# Patient Record
Sex: Female | Born: 1970 | Race: White | Hispanic: No | Smoking: Current some day smoker
Health system: Southern US, Community
[De-identification: ages and names within clinical notes are randomized; demographics above are authoritative.]

## PROBLEM LIST (undated history)

## (undated) DIAGNOSIS — M313 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: Secondary | ICD-10-CM

## (undated) DIAGNOSIS — M069 Rheumatoid arthritis, unspecified: Secondary | ICD-10-CM

## (undated) DIAGNOSIS — F419 Anxiety disorder, unspecified: Secondary | ICD-10-CM

## (undated) DIAGNOSIS — N6452 Nipple discharge: Secondary | ICD-10-CM

## (undated) DIAGNOSIS — J8410 Pulmonary fibrosis, unspecified: Secondary | ICD-10-CM

## (undated) DIAGNOSIS — F32A Depression, unspecified: Secondary | ICD-10-CM

## (undated) DIAGNOSIS — D689 Coagulation defect, unspecified: Secondary | ICD-10-CM

## (undated) DIAGNOSIS — F329 Major depressive disorder, single episode, unspecified: Secondary | ICD-10-CM

## (undated) HISTORY — DX: 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: M31.30

## (undated) HISTORY — DX: Rheumatoid arthritis, unspecified: M06.9

## (undated) HISTORY — DX: Pulmonary fibrosis, unspecified: J84.10

## (undated) HISTORY — DX: Coagulation defect, unspecified: D68.9

## (undated) HISTORY — DX: Anxiety disorder, unspecified: F41.9

## (undated) HISTORY — PX: ABDOMINAL HYSTERECTOMY: SHX81

## (undated) SURGERY — ABDOMINAL AORTOGRAM W/LOWER EXTREMITY
Anesthesia: Moderate Sedation

---

## 2004-03-15 HISTORY — PX: BREAST CYST EXCISION: SHX579

## 2006-06-27 ENCOUNTER — Ambulatory Visit: Payer: Self-pay | Admitting: Oncology

## 2011-03-16 DIAGNOSIS — N6452 Nipple discharge: Secondary | ICD-10-CM

## 2011-03-16 HISTORY — DX: Nipple discharge: N64.52

## 2011-06-04 ENCOUNTER — Emergency Department: Payer: Self-pay | Admitting: Unknown Physician Specialty

## 2011-06-04 LAB — COMPREHENSIVE METABOLIC PANEL
Albumin: 3.5 g/dL (ref 3.4–5.0)
Alkaline Phosphatase: 80 U/L (ref 50–136)
Anion Gap: 7 (ref 7–16)
BUN: 8 mg/dL (ref 7–18)
Bilirubin,Total: 0.3 mg/dL (ref 0.2–1.0)
Calcium, Total: 8.3 mg/dL — ABNORMAL LOW (ref 8.5–10.1)
Chloride: 105 mmol/L (ref 98–107)
EGFR (Non-African Amer.): >60
Osmolality: 273 (ref 275–301)
Potassium: 3.6 mmol/L (ref 3.5–5.1)
SGOT(AST): 13 U/L — ABNORMAL LOW (ref 15–37)
SGPT (ALT): 14 U/L
Sodium: 138 mmol/L (ref 136–145)
Total Protein: 7.9 g/dL (ref 6.4–8.2)

## 2011-06-04 LAB — CBC
HCT: 37.7 % (ref 35.0–47.0)
HGB: 12.6 g/dL (ref 12.0–16.0)
MCH: 29.7 pg (ref 26.0–34.0)
Platelet: 296 10*3/uL (ref 150–440)
RBC: 4.26 10*6/uL (ref 3.80–5.20)
RDW: 14.3 % (ref 11.5–14.5)
WBC: 13 10*3/uL — ABNORMAL HIGH (ref 3.6–11.0)

## 2011-06-04 LAB — CK TOTAL AND CKMB (NOT AT ARMC): CK-MB: <0.5 ng/mL — ABNORMAL LOW (ref 0.5–3.6)

## 2011-06-04 LAB — TROPONIN I: Troponin-I: <0.02 ng/mL

## 2011-06-05 LAB — RAPID INFLUENZA A&B ANTIGENS

## 2011-06-09 LAB — WOUND CULTURE

## 2011-09-07 ENCOUNTER — Ambulatory Visit: Payer: Self-pay

## 2011-10-18 ENCOUNTER — Emergency Department: Payer: Self-pay | Admitting: Emergency Medicine

## 2012-06-05 ENCOUNTER — Emergency Department: Payer: Self-pay | Admitting: Unknown Physician Specialty

## 2012-06-06 LAB — BETA STREP CULTURE(ARMC)

## 2012-07-01 ENCOUNTER — Emergency Department: Payer: Self-pay | Admitting: Emergency Medicine

## 2013-01-04 ENCOUNTER — Emergency Department: Payer: Self-pay | Admitting: Internal Medicine

## 2013-01-04 LAB — RAPID INFLUENZA A&B ANTIGENS

## 2013-09-17 ENCOUNTER — Emergency Department: Payer: Self-pay | Admitting: Emergency Medicine

## 2013-09-17 LAB — COMPREHENSIVE METABOLIC PANEL
ANION GAP: 8 (ref 7–16)
AST: 22 U/L (ref 15–37)
Albumin: 3.3 g/dL — ABNORMAL LOW (ref 3.4–5.0)
Alkaline Phosphatase: 76 U/L
BUN: 12 mg/dL (ref 7–18)
Bilirubin,Total: 0.1 mg/dL — ABNORMAL LOW (ref 0.2–1.0)
CALCIUM: 8 mg/dL — AB (ref 8.5–10.1)
CREATININE: 0.69 mg/dL (ref 0.60–1.30)
Chloride: 104 mmol/L (ref 98–107)
Co2: 25 mmol/L (ref 21–32)
Glucose: 87 mg/dL (ref 65–99)
Osmolality: 273 (ref 275–301)
Potassium: 3.8 mmol/L (ref 3.5–5.1)
SGPT (ALT): 21 U/L (ref 12–78)
Sodium: 137 mmol/L (ref 136–145)
Total Protein: 7.1 g/dL (ref 6.4–8.2)

## 2013-09-17 LAB — CBC WITH DIFFERENTIAL/PLATELET
BASOS PCT: 0.6 %
Basophil #: 0.1 10*3/uL (ref 0.0–0.1)
EOS ABS: 0.6 10*3/uL (ref 0.0–0.7)
Eosinophil %: 6.1 %
HCT: 38.7 % (ref 35.0–47.0)
HGB: 13.3 g/dL (ref 12.0–16.0)
Lymphocyte #: 2.4 10*3/uL (ref 1.0–3.6)
Lymphocyte %: 23.3 %
MCH: 30.6 pg (ref 26.0–34.0)
MCHC: 34.2 g/dL (ref 32.0–36.0)
MCV: 89 fL (ref 80–100)
Monocyte #: 0.6 x10 3/mm (ref 0.2–0.9)
Monocyte %: 6 %
Neutrophil #: 6.5 10*3/uL (ref 1.4–6.5)
Neutrophil %: 64 %
Platelet: 314 10*3/uL (ref 150–440)
RBC: 4.33 10*6/uL (ref 3.80–5.20)
RDW: 14.3 % (ref 11.5–14.5)
WBC: 10.1 10*3/uL (ref 3.6–11.0)

## 2013-09-17 LAB — URINALYSIS, COMPLETE
BACTERIA: NONE SEEN
BLOOD: NEGATIVE
Bilirubin,UR: NEGATIVE
Glucose,UR: NEGATIVE mg/dL (ref 0–75)
KETONE: NEGATIVE
LEUKOCYTE ESTERASE: NEGATIVE
Nitrite: NEGATIVE
Ph: 5 (ref 4.5–8.0)
Protein: NEGATIVE
SPECIFIC GRAVITY: 1.008 (ref 1.003–1.030)
Squamous Epithelial: 2

## 2013-09-17 LAB — CK TOTAL AND CKMB (NOT AT ARMC)
CK, Total: 49 U/L
CK-MB: 0.6 ng/mL (ref 0.5–3.6)

## 2013-09-17 LAB — TROPONIN I
Troponin-I: <0.02 ng/mL
Troponin-I: <0.02 ng/mL

## 2013-09-17 LAB — LIPASE, BLOOD: LIPASE: 130 U/L (ref 73–393)

## 2013-11-05 ENCOUNTER — Emergency Department: Payer: Self-pay | Admitting: Emergency Medicine

## 2013-11-08 ENCOUNTER — Emergency Department: Payer: Self-pay | Admitting: Emergency Medicine

## 2013-11-08 LAB — CBC WITH DIFFERENTIAL/PLATELET
BASOS ABS: 0.1 10*3/uL (ref 0.0–0.1)
Basophil %: 1.3 %
EOS ABS: 0.3 10*3/uL (ref 0.0–0.7)
Eosinophil %: 5.4 %
HCT: 42.1 % (ref 35.0–47.0)
HGB: 13.5 g/dL (ref 12.0–16.0)
Lymphocyte #: 2.3 10*3/uL (ref 1.0–3.6)
Lymphocyte %: 36.7 %
MCH: 29.3 pg (ref 26.0–34.0)
MCHC: 32.1 g/dL (ref 32.0–36.0)
MCV: 91 fL (ref 80–100)
Monocyte #: 0.9 x10 3/mm (ref 0.2–0.9)
Monocyte %: 14 %
Neutrophil #: 2.7 10*3/uL (ref 1.4–6.5)
Neutrophil %: 42.6 %
Platelet: 304 10*3/uL (ref 150–440)
RBC: 4.61 10*6/uL (ref 3.80–5.20)
RDW: 14.3 % (ref 11.5–14.5)
WBC: 6.3 10*3/uL (ref 3.6–11.0)

## 2013-11-08 LAB — BASIC METABOLIC PANEL
Anion Gap: 10 (ref 7–16)
BUN: 10 mg/dL (ref 7–18)
Calcium, Total: 8.3 mg/dL — ABNORMAL LOW (ref 8.5–10.1)
Chloride: 100 mmol/L (ref 98–107)
Co2: 23 mmol/L (ref 21–32)
Creatinine: 0.73 mg/dL (ref 0.60–1.30)
EGFR (African American): >60
EGFR (Non-African Amer.): >60
Glucose: 92 mg/dL (ref 65–99)
Osmolality: 265 (ref 275–301)
POTASSIUM: 3.7 mmol/L (ref 3.5–5.1)
Sodium: 133 mmol/L — ABNORMAL LOW (ref 136–145)

## 2014-07-23 ENCOUNTER — Encounter: Payer: Self-pay | Admitting: *Deleted

## 2014-07-23 ENCOUNTER — Emergency Department: Payer: Self-pay

## 2014-07-23 ENCOUNTER — Other Ambulatory Visit: Payer: Self-pay

## 2014-07-23 ENCOUNTER — Emergency Department
Admission: EM | Admit: 2014-07-23 | Discharge: 2014-07-23 | Disposition: A | Discharge status: Home / Self Care | Payer: Self-pay | Attending: Emergency Medicine | Admitting: Emergency Medicine

## 2014-07-23 DIAGNOSIS — M79622 Pain in left upper arm: Secondary | ICD-10-CM | POA: Insufficient documentation

## 2014-07-23 DIAGNOSIS — Z720 Tobacco use: Secondary | ICD-10-CM | POA: Insufficient documentation

## 2014-07-23 DIAGNOSIS — R079 Chest pain, unspecified: Secondary | ICD-10-CM | POA: Insufficient documentation

## 2014-07-23 DIAGNOSIS — M79602 Pain in left arm: Secondary | ICD-10-CM

## 2014-07-23 HISTORY — DX: Depression, unspecified: F32.A

## 2014-07-23 HISTORY — DX: Major depressive disorder, single episode, unspecified: F32.9

## 2014-07-23 LAB — CBC
HCT: 38.4 % (ref 35.0–47.0)
Hemoglobin: 12.7 g/dL (ref 12.0–16.0)
MCH: 29.3 pg (ref 26.0–34.0)
MCHC: 33 g/dL (ref 32.0–36.0)
MCV: 88.7 fL (ref 80.0–100.0)
Platelets: 297 10*3/uL (ref 150–440)
RBC: 4.33 MIL/uL (ref 3.80–5.20)
RDW: 14.8 % — AB (ref 11.5–14.5)
WBC: 7.6 10*3/uL (ref 3.6–11.0)

## 2014-07-23 LAB — BASIC METABOLIC PANEL
Anion gap: 7 (ref 5–15)
BUN: 17 mg/dL (ref 6–20)
CO2: 27 mmol/L (ref 22–32)
Calcium: 8.9 mg/dL (ref 8.9–10.3)
Chloride: 104 mmol/L (ref 101–111)
Creatinine, Ser: 0.43 mg/dL — ABNORMAL LOW (ref 0.44–1.00)
GFR calc non Af Amer: >60 mL/min (ref >60)
Glucose, Bld: 121 mg/dL — ABNORMAL HIGH (ref 65–99)
Potassium: 3.7 mmol/L (ref 3.5–5.1)
Sodium: 138 mmol/L (ref 135–145)

## 2014-07-23 LAB — TROPONIN I: Troponin I: <0.03 ng/mL (ref <0.031)

## 2014-07-23 MED ORDER — PREDNISONE 20 MG PO TABS: 40.0000 mg | ORAL_TABLET | Freq: Every day | ORAL | Status: AC

## 2014-07-23 NOTE — ED Provider Notes (Signed)
[facility] Emergency Department Provider Note    ____________________________________________  Time seen: 1640  I have reviewed the triage vital signs and the nursing notes.   HISTORY  Chief Complaint Chest Pain and Arm Pain   History limited by: Not Limited   HPI Alexandra [name] is a 44 y.o. female who presents to the emergency department today because of left upper extremity pain. The patient states that she first developed pain 3 days ago upon awakening. She denies any trauma to the arm. She states the pain is located only in the upper arm and is sharp in nature. It is not tender to touch. She has not had any associated numbness or weakness of the arm. She says the pain is somewhat worse when she lies flat. She has never had pain like this before. No shortness of breath. No fevers.     Past Medical History  Diagnosis Date  . Depression     There are no active problems to display for this patient.   Past Surgical History  Procedure Laterality Date  . Abdominal hysterectomy      No current outpatient prescriptions on file.  Allergies Review of patient's allergies indicates no known allergies.  No family history on file.  Social History History  Substance Use Topics  . Smoking status: Current Every Day Smoker -- 1.00 packs/day for 29 years    Types: Cigarettes  . Smokeless tobacco: Never Used  . Alcohol Use: Yes     Comment: social    Review of Systems  Constitutional: Negative for fever. Cardiovascular: Positive for chest pain. Respiratory: Negative for shortness of breath. Gastrointestinal: Negative for abdominal pain, vomiting and diarrhea. Genitourinary: Negative for dysuria. Musculoskeletal: Negative for back pain. Skin: Negative for rash. Neurological: Negative for headaches, focal weakness or numbness.  10-point ROS otherwise negative.  ____________________________________________   PHYSICAL EXAM:  VITAL  SIGNS: ED Triage Vitals  Enc Vitals Group     BP --      Pulse Rate 07/23/14 1316 75     Resp --      Temp 07/23/14 1316 98.3 F (36.8 C)     Temp Source 07/23/14 1316 Oral     SpO2 07/23/14 1316 99 %     Weight 07/23/14 1316 135 lb (61.236 kg)     Height 07/23/14 1316 5\' 4"  (1.626 m)     Head Cir --      Peak Flow --      Pain Score 07/23/14 1317 9   Constitutional: Alert and oriented. Well appearing and in no distress. Eyes: Conjunctivae are normal. PERRL. Normal extraocular movements. ENT   Head: Normocephalic and atraumatic.   Nose: No congestion/rhinnorhea.   Mouth/Throat: Mucous membranes are moist.   Neck: No stridor. Hematological/Lymphatic/Immunilogical: No cervical lymphadenopathy. Cardiovascular: Normal rate, regular rhythm.  No murmurs, rubs, or gallops. Respiratory: Normal respiratory effort without tachypnea nor retractions.  Genitourinary: Deferred Musculoskeletal: Normal range of motion in all extremities. No joint effusions.  No lower extremity tenderness nor edema. Tenderness to palpation of the left upper extremity, no deformities, no erythema, no joint swelling, no decreased range of motion. Neurologic:  Normal speech and language. No gross focal neurologic deficits are appreciated. Speech is normal.  Skin:  Skin is warm, dry and intact. No rash noted. Psychiatric: Mood and affect are normal. Speech and behavior are normal. Patient exhibits appropriate insight and judgment.  ____________________________________________    LABS (pertinent positives/negatives)  Labs Reviewed  CBC - Abnormal;  Notable for the following:    RDW 14.8 (*)    All other components within normal limits  BASIC METABOLIC PANEL - Abnormal; Notable for the following:    Glucose, Bld 121 (*)    Creatinine, Ser 0.43 (*)    All other components within normal limits  TROPONIN I     ____________________________________________   EKG  EKG Time: 1332 Rate: 74 Rhythm:  Normal sinus rhythm Axis: Normal Intervals: QTc 432 QRS: Normal ST changes: No ST elevation    ____________________________________________    RADIOLOGY  Chest x-ray IMPRESSION: No acute cardiopulmonary abnormality seen.  ____________________________________________   PROCEDURES  Procedure(s) performed: None  Critical Care performed: No  ____________________________________________   INITIAL IMPRESSION / ASSESSMENT AND PLAN / ED COURSE  Pertinent labs & imaging results that were available during my care of the patient were reviewed by me and considered in my medical decision making (see chart for details).  Patient here because of left arm pain for a number of days. Workup without any clear etiology of the pain. Physical exam without any concerning findings. Question if this is an inflammation of the nerve. Although I would think unlikely pericarditis is also on the differential given that the patient says it is worse when she lies flat. Advised patient to continue to take high-dose ibuprofen which she has been doing. Additionally will give short course of steroids to help with any potential inflammation.  ____________________________________________   FINAL CLINICAL IMPRESSION(S) / ED DIAGNOSES  Final diagnoses:  Pain of left upper extremity     Phineas SemenGraydon Sione Baumgarten, MD 07/23/14 [phone]

## 2014-07-23 NOTE — ED Notes (Signed)
Pt here with c/o chest pain this am and constant pain in left arm x 2 days.  Pt advises pain is worse at times but never stops in her arm.  Pt advises she became nauseated this with her chest pain.

## 2014-07-23 NOTE — Discharge Instructions (Signed)
Please seek medical attention for any high fevers, chest pain, shortness of breath, change in behavior, persistent vomiting, bloody stool or any other new or concerning symptoms. ° °Musculoskeletal Pain °Musculoskeletal pain is muscle and boney aches and pains. These pains can occur in any part of the body. Your caregiver may treat you without knowing the cause of the pain. They may treat you if blood or urine tests, X-rays, and other tests were normal.  °CAUSES °There is often not a definite cause or reason for these pains. These pains may be caused by a type of germ (virus). The discomfort may also come from overuse. Overuse includes working out too hard when your body is not fit. Boney aches also come from weather changes. Bone is sensitive to atmospheric pressure changes. °HOME CARE INSTRUCTIONS  °· Ask when your test results will be ready. Make sure you get your test results. °· Only take over-the-counter or prescription medicines for pain, discomfort, or fever as directed by your caregiver. If you were given medications for your condition, do not drive, operate machinery or power tools, or sign legal documents for 24 hours. Do not drink alcohol. Do not take sleeping pills or other medications that may interfere with treatment. °· Continue all activities unless the activities cause more pain. When the pain lessens, slowly resume normal activities. Gradually increase the intensity and duration of the activities or exercise. °· During periods of severe pain, bed rest may be helpful. Lay or sit in any position that is comfortable. °· Putting ice on the injured area. °¨ Put ice in a bag. °¨ Place a towel between your skin and the bag. °¨ Leave the ice on for 15 to 20 minutes, 3 to 4 times a day. °· Follow up with your caregiver for continued problems and no reason can be found for the pain. If the pain becomes worse or does not go away, it may be necessary to repeat tests or do additional testing. Your caregiver  may need to look further for a possible cause. °SEEK IMMEDIATE MEDICAL CARE IF: °· You have pain that is getting worse and is not relieved by medications. °· You develop chest pain that is associated with shortness or breath, sweating, feeling sick to your stomach (nauseous), or throw up (vomit). °· Your pain becomes localized to the abdomen. °· You develop any new symptoms that seem different or that concern you. °MAKE SURE YOU:  °· Understand these instructions. °· Will watch your condition. °· Will get help right away if you are not doing well or get worse. °Document Released: 03/01/2005 Document Revised: 05/24/2011 Document Reviewed: 11/03/2012 °ExitCare® Patient Information ©2015 [employer]. This information is not intended to replace advice given to you by your health care provider. Make sure you discuss any questions you have with your health care provider. ° °

## 2014-08-02 ENCOUNTER — Emergency Department
Admission: EM | Admit: 2014-08-02 | Discharge: 2014-08-03 | Disposition: A | Discharge status: Home / Self Care | Payer: Self-pay | Attending: Emergency Medicine | Admitting: Emergency Medicine

## 2014-08-02 ENCOUNTER — Encounter: Payer: Self-pay | Admitting: Emergency Medicine

## 2014-08-02 DIAGNOSIS — J029 Acute pharyngitis, unspecified: Secondary | ICD-10-CM | POA: Insufficient documentation

## 2014-08-02 DIAGNOSIS — M62830 Muscle spasm of back: Secondary | ICD-10-CM | POA: Insufficient documentation

## 2014-08-02 DIAGNOSIS — M542 Cervicalgia: Secondary | ICD-10-CM | POA: Insufficient documentation

## 2014-08-02 DIAGNOSIS — Z720 Tobacco use: Secondary | ICD-10-CM | POA: Insufficient documentation

## 2014-08-02 DIAGNOSIS — H9201 Otalgia, right ear: Secondary | ICD-10-CM | POA: Insufficient documentation

## 2014-08-02 DIAGNOSIS — Z79899 Other long term (current) drug therapy: Secondary | ICD-10-CM | POA: Insufficient documentation

## 2014-08-02 DIAGNOSIS — M25512 Pain in left shoulder: Secondary | ICD-10-CM | POA: Insufficient documentation

## 2014-08-02 LAB — POCT RAPID STREP A: Streptococcus, Group A Screen (Direct): NEGATIVE

## 2014-08-02 MED ORDER — GI COCKTAIL ~~LOC~~
30.0000 mL | Freq: Once | ORAL | Status: AC
  Administered 2014-08-03: 30 mL via ORAL

## 2014-08-02 MED ORDER — DIAZEPAM 5 MG PO TABS
5.0000 mg | ORAL_TABLET | Freq: Once | ORAL | Status: AC
  Administered 2014-08-03: 5 mg via ORAL

## 2014-08-02 MED ORDER — DIAZEPAM 5 MG PO TABS: 5.0000 mg | ORAL_TABLET | Freq: Three times a day (TID) | ORAL | Status: DC | PRN

## 2014-08-02 MED ORDER — LIDOCAINE VISCOUS 2 % MT SOLN: 20.0000 mL | OROMUCOSAL | Status: DC | PRN

## 2014-08-02 NOTE — Discharge Instructions (Signed)
Pharyngitis Pharyngitis is a sore throat (pharynx). There is redness, pain, and swelling of your throat. HOME CARE   Drink enough fluids to keep your pee (urine) clear or pale yellow.  Only take medicine as told by your doctor.  You may get sick again if you do not take medicine as told. Finish your medicines, even if you start to feel better.  Do not take aspirin.  Rest.  Rinse your mouth (gargle) with salt water ( tsp of salt per 1 qt of water) every 1-2 hours. This will help the pain.  If you are not at risk for choking, you can suck on hard candy or sore throat lozenges. GET HELP IF:  You have large, tender lumps on your neck.  You have a rash.  You cough up green, yellow-brown, or bloody spit. GET HELP RIGHT AWAY IF:   You have a stiff neck.  You drool or cannot swallow liquids.  You throw up (vomit) or are not able to keep medicine or liquids down.  You have very bad pain that does not go away with medicine.  You have problems breathing (not from a stuffy nose). MAKE SURE YOU:   Understand these instructions.  Will watch your condition.  Will get help right away if you are not doing well or get worse. Document Released: 08/18/2007 Document Revised: 12/20/2012 Document Reviewed: 11/06/2012 [facility] CenterExitCare Patient Information 2015 [employer]Care, [place]. This information is not intended to replace advice given to you by your health care provider. Make sure you discuss any questions you have with your health care provider.  Muscle Cramps and Spasms Muscle cramps and spasms are when muscles tighten by themselves. They usually get better within minutes. Muscle cramps are painful. They are usually stronger and last longer than muscle spasms. Muscle spasms may or may not be painful. They can last a few seconds or much longer. HOME CARE  Drink enough fluid to keep your pee (urine) clear or pale yellow.  Massage, stretch, and relax the muscle.  Use a warm towel, heating pad, or  warm shower water on tight muscles.  Place ice on the muscle if it is tender or in pain.  Put ice in a plastic bag.  Place a towel between your skin and the bag.  Leave the ice on for 15-20 minutes, 03-04 times a day.  Only take medicine as told by your doctor. GET HELP RIGHT AWAY IF:  Your cramps or spasms get worse, happen more often, or do not get better with time. MAKE SURE YOU:  Understand these instructions.  Will watch your condition.  Will get help right away if you are not doing well or get worse. Document Released: 02/12/2008 Document Revised: 06/26/2012 Document Reviewed: 02/16/2012 [facility]ExitCare Patient Information 2015 DisneyExitCare, MarylandLLC. This information is not intended to replace advice given to you by your health care provider. Make sure you discuss any questions you have with your health care provider.

## 2014-08-02 NOTE — ED Notes (Signed)
Patient with complaint of sore throat times one week. Patient reports that it has become worse.

## 2014-08-02 NOTE — ED Notes (Signed)
Pt reports sore throat x 1 week.  Pt also reports some nasal congestion, dry cough, and right ear pain.  Pt reports taking tylenol cold and sinus and throat lozenges, ibuprofen, with minimal relief.  Pt NAD at this time.

## 2014-08-02 NOTE — ED Provider Notes (Signed)
[facility] Emergency Department Provider Note     Time seen: ----------------------------------------- 11:54 PM on 08/02/2014 -----------------------------------------    I have reviewed the triage vital signs and the nursing notes.   HISTORY  Chief Complaint Sore Throat    HPI Alexandra [name] is a 44 y.o. female who presents ER for sore throat for the last 7 days. She also complains of continued neck and left shoulder pain. It wakes her up every night between 1 and 4 AM. Throat is sore, worse with swallowing. Over-the-counter medications are not helping this time. She denies any other complaints.    Past Medical History  Diagnosis Date  . Depression     There are no active problems to display for this patient.   Past Surgical History  Procedure Laterality Date  . Abdominal hysterectomy      Current Outpatient Rx  Name  Route  Sig  Dispense  Refill  . citalopram (CELEXA) 40 MG tablet   Oral   Take 40 mg by mouth daily.           Allergies Review of patient's allergies indicates no known allergies.  No family history on file.  Social History History  Substance Use Topics  . Smoking status: Current Every Day Smoker -- 1.00 packs/day for 29 years    Types: Cigarettes  . Smokeless tobacco: Never Used  . Alcohol Use: Yes     Comment: social    Review of Systems Constitutional: Negative for fever. Eyes: Negative for visual changes. ENT: Positive for sore throat Cardiovascular: Negative for chest pain. Respiratory: Negative for shortness of breath. Gastrointestinal: Negative for abdominal pain, vomiting and diarrhea. Genitourinary: Negative for dysuria. Musculoskeletal: Positive left shoulder and neck pain Skin: Negative for rash. Neurological: Negative for headaches, focal weakness or numbness.  10-point ROS otherwise negative.  ____________________________________________   PHYSICAL EXAM:  VITAL SIGNS: ED Triage  Vitals  Enc Vitals Group     BP 08/02/14 2214 107/58 mmHg     Pulse Rate 08/02/14 2214 89     Resp 08/02/14 2214 18     Temp 08/02/14 2214 98.2 F (36.8 C)     Temp Source 08/02/14 2214 Oral     SpO2 08/02/14 2214 95 %     Weight 08/02/14 2214 135 lb (61.236 kg)     Height 08/02/14 2214 5\' 3"  (1.6 m)     Head Cir --      Peak Flow --      Pain Score 08/02/14 2215 10     Pain Loc --      Pain Edu? --      Excl. in GC? --     Constitutional: Alert and oriented. Well appearing and in no distress. Eyes: Conjunctivae are normal. PERRL. Normal extraocular movements. ENT   Head: Normocephalic and atraumatic.   Nose: No congestion/rhinnorhea.   Mouth/Throat: Mucous membranes are moist.   Neck: No stridor. Hematological/Lymphatic/Immunilogical: No cervical lymphadenopathy. Cardiovascular: Normal rate, regular rhythm. Normal and symmetric distal pulses are present in all extremities. No murmurs, rubs, or gallops. Respiratory: Normal respiratory effort without tachypnea nor retractions. Breath sounds are clear and equal bilaterally. No wheezes/rales/rhonchi. Gastrointestinal: Soft and nontender. No distention. No abdominal bruits. There is no CVA tenderness. Musculoskeletal: Extensive muscle tension and spasm tickly around the trapezius muscles bilaterally. Neurologic:  Normal speech and language. No gross focal neurologic deficits are appreciated. Speech is normal. No gait instability. Skin:  Skin is warm, dry and intact. No rash  noted. Psychiatric: Mood and affect are normal. Speech and behavior are normal. Patient exhibits appropriate insight and judgment.  ____________________________________________    LABS (pertinent positives/negatives)  Labs Reviewed  CULTURE, GROUP A STREP [facility])  POCT RAPID STREP A    ____________________________________________  ED COURSE:  Pertinent labs & imaging results that were available during my care of the patient were reviewed by  me and considered in my medical decision making (see chart for details). Patient likely with a constellation of symptoms. Mainly stress, muscle tension and sore throat. This could all be from just not sleeping well or a virus.  ____________________________________________   RADIOLOGY  None  ____________________________________________    FINAL ASSESSMENT AND PLAN  Pharyngitis and muscle spasm next  Plan: Prescribed some Valium for her, which will probably help her muscles relaxants as well as help her anxiety. She'll be given a GI cocktail here in the ER, we'll try some lidocaine as needed for sore throat. Antibiotics would not improve this. Her throat exam is normal    Emily FilbertWilliams, Jonathan E, MD   Emily FilbertJonathan E Williams, MD 08/02/14 [phone]

## 2014-08-03 MED ORDER — GI COCKTAIL ~~LOC~~
ORAL | Status: AC
  Administered 2014-08-03: 30 mL via ORAL
  Filled 2014-08-03: qty 30

## 2014-08-03 MED ORDER — DIAZEPAM 5 MG PO TABS
ORAL_TABLET | ORAL | Status: AC
  Administered 2014-08-03: 5 mg via ORAL
  Filled 2014-08-03: qty 1

## 2014-08-04 LAB — CULTURE, GROUP A STREP (THRC)

## 2014-12-11 ENCOUNTER — Emergency Department
Admission: EM | Admit: 2014-12-11 | Discharge: 2014-12-11 | Disposition: A | Discharge status: Home / Self Care | Payer: Self-pay | Attending: Emergency Medicine | Admitting: Emergency Medicine

## 2014-12-11 ENCOUNTER — Encounter: Payer: Self-pay | Admitting: Emergency Medicine

## 2014-12-11 DIAGNOSIS — N8110 Cystocele, unspecified: Secondary | ICD-10-CM | POA: Insufficient documentation

## 2014-12-11 DIAGNOSIS — Z720 Tobacco use: Secondary | ICD-10-CM | POA: Insufficient documentation

## 2014-12-11 LAB — COMPREHENSIVE METABOLIC PANEL
ALBUMIN: 3.9 g/dL (ref 3.5–5.0)
ALT: 16 U/L (ref 14–54)
ANION GAP: 7 (ref 5–15)
AST: 21 U/L (ref 15–41)
Alkaline Phosphatase: 66 U/L (ref 38–126)
BUN: 13 mg/dL (ref 6–20)
CHLORIDE: 105 mmol/L (ref 101–111)
CO2: 26 mmol/L (ref 22–32)
Calcium: 8.7 mg/dL — ABNORMAL LOW (ref 8.9–10.3)
Creatinine, Ser: 0.55 mg/dL (ref 0.44–1.00)
GFR calc Af Amer: >60 mL/min (ref >60)
Glucose, Bld: 80 mg/dL (ref 65–99)
POTASSIUM: 4 mmol/L (ref 3.5–5.1)
Sodium: 138 mmol/L (ref 135–145)
Total Bilirubin: 0.5 mg/dL (ref 0.3–1.2)
Total Protein: 7.3 g/dL (ref 6.5–8.1)

## 2014-12-11 LAB — CBC
HEMATOCRIT: 38.5 % (ref 35.0–47.0)
Hemoglobin: 12.8 g/dL (ref 12.0–16.0)
MCH: 29.1 pg (ref 26.0–34.0)
MCHC: 33.3 g/dL (ref 32.0–36.0)
MCV: 87.4 fL (ref 80.0–100.0)
Platelets: 322 10*3/uL (ref 150–440)
RBC: 4.4 MIL/uL (ref 3.80–5.20)
RDW: 14.4 % (ref 11.5–14.5)
WBC: 7.8 10*3/uL (ref 3.6–11.0)

## 2014-12-11 LAB — URINALYSIS COMPLETE WITH MICROSCOPIC (ARMC ONLY)
BACTERIA UA: NONE SEEN
Bilirubin Urine: NEGATIVE
GLUCOSE, UA: NEGATIVE mg/dL
HGB URINE DIPSTICK: NEGATIVE
Ketones, ur: NEGATIVE mg/dL
LEUKOCYTES UA: NEGATIVE
Nitrite: NEGATIVE
PH: 7 (ref 5.0–8.0)
PROTEIN: NEGATIVE mg/dL
Specific Gravity, Urine: 1.012 (ref 1.005–1.030)

## 2014-12-11 LAB — LIPASE, BLOOD: LIPASE: 30 U/L (ref 22–51)

## 2014-12-11 NOTE — ED Provider Notes (Signed)
[facility] Emergency Department Provider Note     Time seen: ----------------------------------------- 4:43 PM on 12/11/2014 -----------------------------------------    I have reviewed the triage vital signs and the nursing notes.   HISTORY  Chief Complaint Vaginal Discharge    HPI Alexandra [name] is a 44 y.o. female who presents ER aftershe noticed an abnormal sensation when she wiped after using the restroom. Patient states it feels like something is coming out of her vagina. Patient states the labia are separating like something is trying to come out. Patient states she had hysterectomy 8 years ago was, is not sure she has her cervix or not. She denies any fevers, chills, vaginal discharge or bleeding. She denies having had this happen before.   Past Medical History  Diagnosis Date  . Depression     There are no active problems to display for this patient.   Past Surgical History  Procedure Laterality Date  . Abdominal hysterectomy      Allergies Review of patient's allergies indicates no known allergies.  Social History Social History  Substance Use Topics  . Smoking status: Current Every Day Smoker -- 1.00 packs/day for 29 years    Types: Cigarettes  . Smokeless tobacco: Never Used  . Alcohol Use: Yes     Comment: social    Review of Systems Constitutional: Negative for fever. Eyes: Negative for visual changes. ENT: Negative for sore throat. Cardiovascular: Negative for chest pain. Respiratory: Negative for shortness of breath. Gastrointestinal: Negative for abdominal pain, vomiting and diarrhea. Genitourinary: Negative for dysuria. Positive for vaginal discomfort Musculoskeletal: Negative for back pain. Skin: Negative for rash. Neurological: Negative for headaches, focal weakness or numbness.  10-point ROS otherwise negative.  ____________________________________________   PHYSICAL EXAM:  VITAL SIGNS: ED Triage  Vitals  Enc Vitals Group     BP 12/11/14 1605 135/75 mmHg     Pulse Rate 12/11/14 1605 80     Resp --      Temp 12/11/14 1605 99.2 F (37.3 C)     Temp Source 12/11/14 1605 Oral     SpO2 12/11/14 1605 99 %     Weight 12/11/14 1605 135 lb (61.236 kg)     Height 12/11/14 1605  (1.575 m)     Head Cir --      Peak Flow --      Pain Score 12/11/14 1605 6     Pain Loc --      Pain Edu? --      Excl. in GC? --     Constitutional: Alert and oriented. Well appearing and in no distress. Gastrointestinal: Soft and nontender. No distention. No abdominal bruits.  Musculoskeletal: Nontender with normal range of motion in all extremities. No joint effusions.  No lower extremity tenderness nor edema. Genitourinary: There is light vaginal discharge noted, there is what appears to be mild bladder prolapse into the vagina. Skin:  Skin is warm, dry and intact. No rash noted.  ____________________________________________  ED COURSE:  Pertinent labs & imaging results that were available during my care of the patient were reviewed by me and considered in my medical decision making (see chart for details). Patient with mild prolapse, discussed Kegel maneuvers, pessary and GYN follow-up. ____________________________________________    LABS (pertinent positives/negatives)  Labs Reviewed  URINALYSIS COMPLETEWITH MICROSCOPIC ([facility] ONLY) - Abnormal; Notable for the following:    Color, Urine YELLOW (*)    APPearance CLEAR (*)    Squamous Epithelial / LPF 0-5 (*)  All other components within normal limits  CBC  LIPASE, BLOOD  COMPREHENSIVE METABOLIC PANEL    ____________________________________________  FINAL ASSESSMENT AND PLAN  Prolapse  Plan: Patient with labs and imaging as dictated above. Patient with mild prolapse as dictated above. She'll be referred to GYN her follow-up.   Emily Filbert, MD   Emily Filbert, MD 12/11/14 [phone]

## 2014-12-11 NOTE — Discharge Instructions (Signed)
Colpocleisis  Colpocleisis (colpectomy) is a surgical procedure to partially or completely remove and stitch (suture) the vagina closed, including the opening. A reason for the surgery is to help with problems caused by the falling down (prolapse) of one or more organs. Prolapse could involve the uterus, bladder, or rectum and is often caused by having babies, heavy straining and lifting for a long period of time, previous pelvic surgery, obesity, chronic constipation with straining, and aging.  Colpocleisis may be done in women who:   Have stopped menstruating.   Have already had their uterus removed (hysterectomy).   Do not desire to have sexual intercourse.   Have medical problems that might make more advanced surgery very risky.  LET YOUR HEALTH CARE PROVIDER KNOW ABOUT:    Any allergies you have.   All medicines you are taking, including vitamins, herbs, eye drops, creams, and over-the-counter medicines.   Previous problems you or members of your family have had with the use of anesthetics.   Any blood disorders you have.   Previous surgeries you have had.   Medical conditions you have.   Any colds or infections you have had recently.  RISKS AND COMPLICATIONS   Generally, this is a safe procedure. However, as with any procedure, complications can occur. Possible complications include:   Injury to surrounding pelvic organs.   Bleeding.   Infection.   Blood clots to the legs or lungs.   Problems with the anesthetic.  BEFORE THE PROCEDURE    Ask your health care provider about changing or stopping any regular medicines.   Do not eat or drink anything for 6-8 hours before the procedure.  PROCEDURE    An IV tube will be placed in a vein. You will be given one of the following:   A medicine that numbs the area (local anesthetic).   A medicine that makes you sleep (general anesthetic).   A medicine injected into your spine that numbs your body below the waist (spinal anesthetic).   The  protruding organs are reduced back to their normal position.   The top and bottom of the vagina are removed out through the opening of the vagina.   The opening of the vagina is closed using absorbable sutures. These will dissolve in 1-2 months.  AFTER THE PROCEDURE    You will go to a recovery room until your blood pressure, pulse, breathing, and temperature (vital signs) are okay. Then you will be transferred to a regular hospital room.   You will still have an IV tube in your vein for about 2 days. You will also have a catheter in your bladder for about 2 days.   You may be given an antibiotic medicine to prevent an infection.   You may be given pain medicine.  Document Released: 05/26/2009 Document Revised: 11/01/2012 Document Reviewed: 08/23/2012  ExitCare Patient Information 2015 [employer]. This information is not intended to replace advice given to you by your health care provider. Make sure you discuss any questions you have with your health care provider.

## 2014-12-11 NOTE — ED Notes (Signed)
Pt states that yesterday, when she wiped after using the restroom, it "felt different." She has felt today that "something is coming out" of her vagina. She had hysterectomy 8 years ago. C/o pain to LLQ rated 6/10. Pt alert & oriented with NAD.

## 2015-01-15 ENCOUNTER — Ambulatory Visit: Payer: Self-pay | Attending: Oncology | Admitting: *Deleted

## 2015-01-15 ENCOUNTER — Encounter: Payer: Self-pay | Admitting: *Deleted

## 2015-01-15 ENCOUNTER — Other Ambulatory Visit: Payer: Self-pay | Admitting: *Deleted

## 2015-01-15 VITALS — BP 104/66 | HR 66 | Temp 97.7°F | Resp 20 | Ht 64.17 in | Wt 131.4 lb

## 2015-01-15 DIAGNOSIS — Z0000 Encounter for general adult medical examination without abnormal findings: Secondary | ICD-10-CM

## 2015-01-15 DIAGNOSIS — N6452 Nipple discharge: Secondary | ICD-10-CM

## 2015-01-15 NOTE — Patient Instructions (Signed)
Gave patient hand-out, Women Staying Healthy, Active and Well from [facility], with education on breast health, pap smears, heart and colon health. 

## 2015-01-15 NOTE — Progress Notes (Signed)
Subjective:     Patient ID: Alexandra [name], female   DOB: 07-20-1970, 44 y.o.   MRN: [id number]  HPI   Review of Systems     Objective:   Physical Exam  Pulmonary/Chest: Right breast exhibits no inverted nipple, no mass, no nipple discharge, no skin change and no tenderness. Left breast exhibits nipple discharge. Left breast exhibits no inverted nipple, no mass, no skin change and no tenderness. Breasts are symmetrical.  Patient states she has had "yellow nipple discharge for about 4 years"  States she finds wetness in her bra and "crustiness" on her left nipple.  Genitourinary: No labial fusion. There is no rash, tenderness, lesion or injury on the right labia. There is no rash, tenderness, lesion or injury on the left labia. Right adnexum displays no mass, no tenderness and no fullness. Left adnexum displays no mass, no tenderness and no fullness. No erythema, tenderness or bleeding in the vagina. No foreign body around the vagina. No signs of injury around the vagina. No vaginal discharge found.  hysterectomy       Assessment:     44 year old White female returns to [facility] for annual screening.  Significant family history of breast cancer.  Mom has had breast cancer 3 times, but is BRCA negative.  Her maternal aunt has had breast cancer, her maternal grandmother, and maternal great aunt have had breast cancer.  Encouraged patient to get annual screening mammograms due to family history.  Taught self breast awareness.  Patient complains of a yellowish, spontaneous left nipple discharge for about 3-4 years.  None noted on exam today.  Patient has a history of cervical cancer at age 44.  States she had a hysterectomy in her 45's, for reasons other than cancer.  Specimen collected for pap smear without difficulty.  Patient has been screened for eligibility.  She does not have any insurance, Medicare or Medicaid.  She also meets financial eligibility.  Hand-out given on the Affordable Care Act.     Plan:     Bilateral diagnostic mammogram and ultrasound ordered for left nipple discharge.  If no findings on imaging will go ahead and refer to Dr. Jamal Collin for further evaluation. Patient is agreeable to the plan.

## 2015-01-21 LAB — PAP LB AND HPV HIGH-RISK
HPV, HIGH-RISK: NEGATIVE
PAP SMEAR COMMENT: 0

## 2015-01-30 ENCOUNTER — Ambulatory Visit
Admission: RE | Admit: 2015-01-30 | Discharge: 2015-01-30 | Disposition: A | Discharge status: Home / Self Care | Payer: Self-pay | Source: Ambulatory Visit | Attending: Oncology | Admitting: Oncology

## 2015-01-30 ENCOUNTER — Encounter: Payer: Self-pay | Admitting: *Deleted

## 2015-01-30 DIAGNOSIS — N6452 Nipple discharge: Secondary | ICD-10-CM

## 2015-01-30 HISTORY — DX: Nipple discharge: N64.52

## 2015-02-03 ENCOUNTER — Telehealth: Payer: Self-pay | Admitting: *Deleted

## 2015-02-03 NOTE — Telephone Encounter (Signed)
Patient called today to review her mammogram results and discuss incidental findings of a left breast nodule.  Patient was already scheduled to see Dr. Evette CristalSankar.  Reminded of her appointment on 02/11/15 @ 11:30.  Will follow up per Dr. Luan MooreSankar's recommendations / radiology recommendations.  Informed of her of her normal pap smear results.  Next pap due in 5 years.

## 2015-02-11 ENCOUNTER — Ambulatory Visit (INDEPENDENT_AMBULATORY_CARE_PROVIDER_SITE_OTHER): Payer: PRIVATE HEALTH INSURANCE | Admitting: General Surgery

## 2015-02-11 ENCOUNTER — Encounter: Payer: Self-pay | Admitting: General Surgery

## 2015-02-11 VITALS — BP 116/72 | HR 76 | Resp 12 | Ht 62.0 in | Wt 132.0 lb

## 2015-02-11 DIAGNOSIS — N63 Unspecified lump in breast: Secondary | ICD-10-CM

## 2015-02-11 DIAGNOSIS — N6320 Unspecified lump in the left breast, unspecified quadrant: Secondary | ICD-10-CM

## 2015-02-11 DIAGNOSIS — Z803 Family history of malignant neoplasm of breast: Secondary | ICD-10-CM | POA: Diagnosis not present

## 2015-02-11 NOTE — Progress Notes (Signed)
Patient ID: Alexandra [name], female   DOB: [birth year], 44 y.o.   MRN: [id number]  Chief Complaint  Patient presents with  . Other    mammogram    HPI Alexandra [name] is a 44 y.o. female. who presents for a breast evaluation. The most recent mammogram and ultrasound was done on 01/30/15. Patient states she noticed some yellow discharge in her left breast about a year ago. Also complains of nipple soreness.  Patient does perform regular self breast checks but does not get regular mammograms done. Strong family history of breast cancer. Mother tested negative for genetic markers.  I have reviewed the history of present illness with the patient.  HPI  Past Medical History  Diagnosis Date  . Depression   . Breast discharge 2013    present X 3 years  Left only multiple ducts per pt    Past Surgical History  Procedure Laterality Date  . Abdominal hysterectomy    . Breast cyst excision Left 2006    Family History  Problem Relation Age of Onset  . Breast cancer Mother 5844    Living  . Breast cancer Maternal Aunt     Living  . Breast cancer Maternal Grandmother     Living  . Breast cancer Maternal Aunt     Maternal Great Aunt Deceased    Social History Social History  Substance Use Topics  . Smoking status: Current Every Day Smoker -- 1.00 packs/day for 29 years    Types: Cigarettes  . Smokeless tobacco: Never Used  . Alcohol Use: 0.0 oz/week    0 Standard drinks or equivalent per week     Comment: social    No Known Allergies  Current Outpatient Prescriptions  Medication Sig Dispense Refill  . citalopram (CELEXA) 40 MG tablet Take 40 mg by mouth daily.     No current facility-administered medications for this visit.    Review of Systems Review of Systems  Constitutional: Negative.   Respiratory: Negative.   Cardiovascular: Negative.     Blood pressure 116/72, pulse 76, resp. rate 12, height 5\' 2"  (1.575 m), weight 132 lb (59.875 kg), last menstrual period  01/14/2009.  Physical Exam Physical Exam  Constitutional: She is oriented to person, place, and time. She appears well-developed and well-nourished.  Eyes: Conjunctivae are normal.  Neck: Neck supple.  Cardiovascular: Normal rate, regular rhythm and normal heart sounds.   Pulmonary/Chest: Effort normal and breath sounds normal. Right breast exhibits no inverted nipple, no mass, no nipple discharge, no skin change and no tenderness. Left breast exhibits no inverted nipple, no mass, no nipple discharge, no skin change and no tenderness.  B/l nipple tenderness  Abdominal: Soft. There is no hepatomegaly. There is no tenderness.  Lymphadenopathy:    She has no cervical adenopathy.    She has no axillary adenopathy.  Neurological: She is alert and oriented to person, place, and time.  Skin: Skin is warm and dry.    Data Reviewed Mammogram and ultrasound reviewed, 3-534mm hypoechoic mas at 10:00 on left breast-2cmfn. No ductal hyperplasia or suspicious findings noted.   Assessment    Breast mass, left breast.     Plan   Pt advised fully on findings. Left nipple discharge is likely benign. The tiny left breast  mass can be followed. Advised on monthly self breast exam. Follow up left breast ultrasound in 6 months.  [facility]     PCP:  No Pcp Per Patient  Kieth BrightlySANKAR,SEEPLAPUTHUR G 02/11/2015, 1:05 PM

## 2015-02-11 NOTE — Patient Instructions (Addendum)
Continue self breast exams. Call office for any new breast issues or concerns. Return in 6 months for left breast ultrasoundBreast Self-Awareness Practicing breast self-awareness may pick up problems early, prevent significant medical complications, and possibly save your life. By practicing breast self-awareness, you can become familiar with how your breasts look and feel and if your breasts are changing. This allows you to notice changes early. It can also offer you some reassurance that your breast health is good. One way to learn what is normal for your breasts and whether your breasts are changing is to do a breast self-exam. If you find a lump or something that was not present in the past, it is best to contact your caregiver right away. Other findings that should be evaluated by your caregiver include nipple discharge, especially if it is bloody; skin changes or reddening; areas where the skin seems to be pulled in (retracted); or new lumps and bumps. Breast pain is seldom associated with cancer (malignancy), but should also be evaluated by a caregiver. HOW TO PERFORM A BREAST SELF-EXAM The best time to examine your breasts is 5-7 days after your menstrual period is over. During menstruation, the breasts are lumpier, and it may be more difficult to pick up changes. If you do not menstruate, have reached menopause, or had your uterus removed (hysterectomy), you should examine your breasts at regular intervals, such as monthly. If you are breastfeeding, examine your breasts after a feeding or after using a breast pump. Breast implants do not decrease the risk for lumps or tumors, so continue to perform breast self-exams as recommended. Talk to your caregiver about how to determine the difference between the implant and breast tissue. Also, talk about the amount of pressure you should use during the exam. Over time, you will become more familiar with the variations of your breasts and more comfortable with  the exam. A breast self-exam requires you to remove all your clothes above the waist. 1. Look at your breasts and nipples. Stand in front of a mirror in a room with good lighting. With your hands on your hips, push your hands firmly downward. Look for a difference in shape, contour, and size from one breast to the other (asymmetry). Asymmetry includes puckers, dips, or bumps. Also, look for skin changes, such as reddened or scaly areas on the breasts. Look for nipple changes, such as discharge, dimpling, repositioning, or redness. 2. Carefully feel your breasts. This is best done either in the shower or tub while using soapy water or when flat on your back. Place the arm (on the side of the breast you are examining) above your head. Use the pads (not the fingertips) of your three middle fingers on your opposite hand to feel your breasts. Start in the underarm area and use  inch (2 cm) overlapping circles to feel your breast. Use 3 different levels of pressure (light, medium, and firm pressure) at each circle before moving to the next circle. The light pressure is needed to feel the tissue closest to the skin. The medium pressure will help to feel breast tissue a little deeper, while the firm pressure is needed to feel the tissue close to the ribs. Continue the overlapping circles, moving downward over the breast until you feel your ribs below your breast. Then, move one finger-width towards the center of the body. Continue to use the  inch (2 cm) overlapping circles to feel your breast as you move slowly up toward the collar bone (clavicle)  near the base of the neck. Continue the up and down exam using all 3 pressures until you reach the middle of the chest. Do this with each breast, carefully feeling for lumps or changes. 3.  Keep a written record with breast changes or normal findings for each breast. By writing this information down, you do not need to depend only on memory for size, tenderness, or  location. Write down where you are in your menstrual cycle, if you are still menstruating. Breast tissue can have some lumps or thick tissue. However, see your caregiver if you find anything that concerns you.  SEEK MEDICAL CARE IF:  You see a change in shape, contour, or size of your breasts or nipples.   You see skin changes, such as reddened or scaly areas on the breasts or nipples.   You have an unusual discharge from your nipples.   You feel a new lump or unusually thick areas.    This information is not intended to replace advice given to you by your health care provider. Make sure you discuss any questions you have with your health care provider.   Document Released: 03/01/2005 Document Revised: 02/16/2012 Document Reviewed: 06/16/2011 Elsevier Interactive Patient Education [employer]2016 Elsevier Inc.

## 2015-03-06 ENCOUNTER — Encounter: Payer: Self-pay | Admitting: *Deleted

## 2015-03-06 ENCOUNTER — Other Ambulatory Visit: Payer: Self-pay | Admitting: *Deleted

## 2015-03-06 DIAGNOSIS — N63 Unspecified lump in unspecified breast: Secondary | ICD-10-CM

## 2015-03-06 NOTE — Progress Notes (Signed)
Letter mailed to inform patient of her next mammogram appointment on 08/01/15 @9 :20.  She is to follow-up with Dr. Evette CristalSankar after her mammo appointment.  His office will schedule her follow-up.  [facility] to [place].

## 2015-04-14 ENCOUNTER — Ambulatory Visit (INDEPENDENT_AMBULATORY_CARE_PROVIDER_SITE_OTHER): Payer: PRIVATE HEALTH INSURANCE | Admitting: General Surgery

## 2015-04-14 ENCOUNTER — Encounter: Payer: Self-pay | Admitting: General Surgery

## 2015-04-14 VITALS — BP 122/70 | HR 78 | Resp 12 | Ht 62.0 in | Wt 134.0 lb

## 2015-04-14 DIAGNOSIS — N6320 Unspecified lump in the left breast, unspecified quadrant: Secondary | ICD-10-CM

## 2015-04-14 DIAGNOSIS — Z803 Family history of malignant neoplasm of breast: Secondary | ICD-10-CM

## 2015-04-14 DIAGNOSIS — N6452 Nipple discharge: Secondary | ICD-10-CM | POA: Diagnosis not present

## 2015-04-14 DIAGNOSIS — N63 Unspecified lump in breast: Secondary | ICD-10-CM | POA: Diagnosis not present

## 2015-04-14 NOTE — Progress Notes (Signed)
Patient ID: Alexandra [name], female   DOB: 11-19-1970, 45 y.o.   MRN: [id number]  Chief Complaint  Patient presents with  . Follow-up    Discharge    HPI Alexandra [name] is a 45 y.o. female.  Here today for b/l nipple discharge and itching.  Patient states this has been going on for about 2 months. Patient has tried cream.  Pain  Is itching or burning, lasts for 15 mins at a time. HPI I have reviewed the history of present illness with the patient.  Past Medical History  Diagnosis Date  . Depression   . Breast discharge 2013    present X 3 years  Left only multiple ducts per pt    Past Surgical History  Procedure Laterality Date  . Abdominal hysterectomy    . Breast cyst excision Left 2006    Family History  Problem Relation Age of Onset  . Breast cancer Mother 80    Living  . Breast cancer Maternal Aunt     Living  . Breast cancer Maternal Grandmother     Living  . Breast cancer Maternal Aunt     Maternal Great Aunt Deceased    Social History Social History  Substance Use Topics  . Smoking status: Current Every Day Smoker -- 1.00 packs/day for 29 years    Types: Cigarettes  . Smokeless tobacco: Never Used  . Alcohol Use: 0.0 oz/week    0 Standard drinks or equivalent per week     Comment: social    No Known Allergies  Current Outpatient Prescriptions  Medication Sig Dispense Refill  . citalopram (CELEXA) 40 MG tablet Take 40 mg by mouth daily.     No current facility-administered medications for this visit.    Review of Systems Review of Systems  Constitutional: Negative.   Respiratory: Negative.   Cardiovascular: Negative.     Blood pressure 122/70, pulse 78, resp. rate 12, height  (1.575 m), weight 134 lb (60.782 kg), last menstrual period 01/14/2009.  Physical Exam Physical Exam  Constitutional: She is oriented to person, place, and time. She appears well-developed and well-nourished.  Eyes: Conjunctivae are normal. No scleral icterus.   Neck: Neck supple.  Pulmonary/Chest: Right breast exhibits no inverted nipple, no mass, no nipple discharge, no skin change and no tenderness. Left breast exhibits no inverted nipple, no mass, no nipple discharge, no skin change and no tenderness.  Lymphadenopathy:    She has no cervical adenopathy.    She has no axillary adenopathy.  Neurological: She is alert and oriented to person, place, and time.  Skin: Skin is warm and dry.    Data Reviewed Notes reviewed  Assessment   Nipple symptoms with no findings     Plan   Try Zinc oxide,alamine lotion or udder cream    F/u in 3 months. Plan for f/u US     No Pcp  This information has been scribed by Micah Flesher, LPN.  SANKAR,SEEPLAPUTHUR G 04/15/2015, 11:08 AM

## 2015-04-14 NOTE — Patient Instructions (Signed)
Follow-up in 3 months    Try Bag Balm or Udder Cream

## 2015-04-15 ENCOUNTER — Encounter: Payer: Self-pay | Admitting: General Surgery

## 2015-05-07 ENCOUNTER — Encounter: Payer: Self-pay | Admitting: Emergency Medicine

## 2015-05-07 ENCOUNTER — Emergency Department
Admission: EM | Admit: 2015-05-07 | Discharge: 2015-05-07 | Disposition: A | Discharge status: Home / Self Care | Payer: Self-pay | Attending: Emergency Medicine | Admitting: Emergency Medicine

## 2015-05-07 DIAGNOSIS — F329 Major depressive disorder, single episode, unspecified: Secondary | ICD-10-CM | POA: Insufficient documentation

## 2015-05-07 DIAGNOSIS — B349 Viral infection, unspecified: Secondary | ICD-10-CM | POA: Insufficient documentation

## 2015-05-07 DIAGNOSIS — F17210 Nicotine dependence, cigarettes, uncomplicated: Secondary | ICD-10-CM | POA: Insufficient documentation

## 2015-05-07 DIAGNOSIS — Z79899 Other long term (current) drug therapy: Secondary | ICD-10-CM | POA: Insufficient documentation

## 2015-05-07 LAB — RAPID INFLUENZA A&B ANTIGENS
Influenza A (ARMC): NOT DETECTED
Influenza B (ARMC): NOT DETECTED

## 2015-05-07 MED ORDER — PSEUDOEPH-BROMPHEN-DM 30-2-10 MG/5ML PO SYRP: 5.0000 mL | ORAL_SOLUTION | Freq: Four times a day (QID) | ORAL | Status: DC | PRN

## 2015-05-07 MED ORDER — IBUPROFEN 800 MG PO TABS: 800.0000 mg | ORAL_TABLET | Freq: Three times a day (TID) | ORAL | Status: DC | PRN

## 2015-05-07 NOTE — ED Notes (Signed)
States generalized body aches and chills since yesterday, states she wants to make sure she does not have the flu

## 2015-05-07 NOTE — Discharge Instructions (Signed)
Viral Infections °A viral infection can be caused by different types of viruses. Most viral infections are not serious and resolve on their own. However, some infections may cause severe symptoms and may lead to further complications. °SYMPTOMS °Viruses can frequently cause: °· Minor sore throat. °· Aches and pains. °· Headaches. °· Runny nose. °· Different types of rashes. °· Watery eyes. °· Tiredness. °· Cough. °· Loss of appetite. °· Gastrointestinal infections, resulting in nausea, vomiting, and diarrhea. °These symptoms do not respond to antibiotics because the infection is not caused by bacteria. However, you might catch a bacterial infection following the viral infection. This is sometimes called a "superinfection." Symptoms of such a bacterial infection may include: °· Worsening sore throat with pus and difficulty swallowing. °· Swollen neck glands. °· Chills and a high or persistent fever. °· Severe headache. °· Tenderness over the sinuses. °· Persistent overall ill feeling (malaise), muscle aches, and tiredness (fatigue). °· Persistent cough. °· Yellow, green, or brown mucus production with coughing. °HOME CARE INSTRUCTIONS  °· Only take over-the-counter or prescription medicines for pain, discomfort, diarrhea, or fever as directed by your caregiver. °· Drink enough water and fluids to keep your urine clear or pale yellow. Sports drinks can provide valuable electrolytes, sugars, and hydration. °· Get plenty of rest and maintain proper nutrition. Soups and broths with crackers or rice are fine. °SEEK IMMEDIATE MEDICAL CARE IF:  °· You have severe headaches, shortness of breath, chest pain, neck pain, or an unusual rash. °· You have uncontrolled vomiting, diarrhea, or you are unable to keep down fluids. °· You or your child has an oral temperature above 102° F (38.9° C), not controlled by medicine. °· Your baby is older than 3 months with a rectal temperature of 102° F (38.9° C) or higher. °· Your baby is 3  months old or younger with a rectal temperature of 100.4° F (38° C) or higher. °MAKE SURE YOU:  °· Understand these instructions. °· Will watch your condition. °· Will get help right away if you are not doing well or get worse. °  °This information is not intended to replace advice given to you by your health care provider. Make sure you discuss any questions you have with your health care provider. °  °Document Released: 12/09/2004 Document Revised: 05/24/2011 Document Reviewed: 08/07/2014 °Elsevier Interactive Patient Education ©2016 Elsevier Inc. ° °

## 2015-05-07 NOTE — ED Notes (Signed)
Pt reports fever and bodyaches since yesterday 

## 2015-05-07 NOTE — ED Provider Notes (Signed)
[facility] Emergency Department Provider Note  ____________________________________________  Time seen: Approximately 1:37 PM  I have reviewed the triage vital signs and the nursing notes.   HISTORY  Chief Complaint Fever and Generalized Body Aches    HPI Alexandra [name] is a 45 y.o. female patient complaining of acute onset of body aches and chills since last night. Patient also complained of frontal headache and sinus congestion. Patient denies any ear pressure pain patient denies any sore throat. Patient is nonproductive cough. Patient states nausea but denies vomiting or diarrhea. No palliative measures taken for this complaint. Patient rates the pain discomfort as a 7/10.   Past Medical History  Diagnosis Date  . Depression   . Breast discharge 2013    present X 3 years  Left only multiple ducts per pt    There are no active problems to display for this patient.   Past Surgical History  Procedure Laterality Date  . Abdominal hysterectomy    . Breast cyst excision Left 2006    Current Outpatient Rx  Name  Route  Sig  Dispense  Refill  . brompheniramine-pseudoephedrine-DM 30-2-10 MG/5ML syrup   Oral   Take 5 mLs by mouth 4 (four) times daily as needed.   120 mL   0   . citalopram (CELEXA) 40 MG tablet   Oral   Take 40 mg by mouth daily.         Marland Kitchen ibuprofen (ADVIL,MOTRIN) 800 MG tablet   Oral   Take 1 tablet (800 mg total) by mouth every 8 (eight) hours as needed.   30 tablet   0     Allergies Review of patient's allergies indicates no known allergies.  Family History  Problem Relation Age of Onset  . Breast cancer Mother 102    Living  . Breast cancer Maternal Aunt     Living  . Breast cancer Maternal Grandmother     Living  . Breast cancer Maternal Aunt     Maternal Great Aunt Deceased    Social History Social History  Substance Use Topics  . Smoking status: Current Every Day Smoker -- 1.00 packs/day for 29 years     Types: Cigarettes  . Smokeless tobacco: Never Used  . Alcohol Use: 0.0 oz/week    0 Standard drinks or equivalent per week     Comment: social    Review of Systems Constitutional: chills. Generalized body aches Eyes: No visual changes. ENT: No sore throat. Cardiovascular: Denies chest pain. Respiratory: Denies shortness of breath. Nonproductive cough Gastrointestinal: No abdominal pain.  nausea, no vomiting.  No diarrhea.  No constipation. Genitourinary: Negative for dysuria. Musculoskeletal: Negative for back pain. Skin: Negative for rash. Neurological: Negative for headaches, focal weakness or numbness. Psychiatric:Depression Endocrine:Depression ________   PHYSICAL EXAM:  VITAL SIGNS: ED Triage Vitals  Enc Vitals Group     BP 05/07/15 1313 101/72 mmHg     Pulse Rate 05/07/15 1313 83     Resp 05/07/15 1313 18     Temp 05/07/15 1313 98.8 F (37.1 C)     Temp Source 05/07/15 1313 Oral     SpO2 05/07/15 1313 98 %     Weight 05/07/15 1313 130 lb (58.968 kg)     Height 05/07/15 1313  (1.6 m)     Head Cir --      Peak Flow --      Pain Score 05/07/15 1326 7     Pain Loc --  Pain Edu? --      Excl. in GC? --     Constitutional: Alert and oriented. Well appearing and in no acute distress. Eyes: Conjunctivae are normal. PERRL. EOMI. Head: Atraumatic. Nose: Bilateral maxillary guarding with edematous nasal turbinates . Mouth/Throat: Mucous membranes are moist.  Oropharynx non-erythematous. Neck: No stridor.  No cervical spine tenderness to palpation. Hematological/Lymphatic/Immunilogical: No cervical lymphadenopathy. Cardiovascular: Normal rate, regular rhythm. Grossly normal heart sounds.  Good peripheral circulation. Respiratory: Normal respiratory effort.  No retractions. Lungs CTAB. Gastrointestinal: Soft and nontender. No distention. No abdominal bruits. No CVA tenderness. Musculoskeletal: No lower extremity tenderness nor edema.  No joint  effusions. Neurologic:  Normal speech and language. No gross focal neurologic deficits are appreciated. No gait instability. Skin:  Skin is warm, dry and intact. No rash noted. Psychiatric: Mood and affect are normal. Speech and behavior are normal.  ____________________________________________   LABS (all labs ordered are listed, but only abnormal results are displayed)  Labs Reviewed  RAPID INFLUENZA A&B ANTIGENS ([facility] ONLY)   ____________________________________________  EKG   ____________________________________________  RADIOLOGY   ____________________________________________   PROCEDURES  Procedure(s) performed: None  Critical Care performed: No  ____________________________________________   INITIAL IMPRESSION / ASSESSMENT AND PLAN / ED COURSE  Pertinent labs & imaging results that were available during my care of the patient were reviewed by me and considered in my medical decision making (see chart for details).  Viral infection. Discussed negative rapid flu test with patient. Patient given discharge Instructions. Patient get a prescription for ibuprofen and [place] DM. Patient [facility]. ____________________________________________   FINAL CLINICAL IMPRESSION(S) / ED DIAGNOSES  Final diagnoses:  Viral illness      Joni Reining, PA-C 05/07/15 1431  Darci Current, MD 05/07/15 [phone]

## 2015-06-24 ENCOUNTER — Emergency Department
Admission: EM | Admit: 2015-06-24 | Discharge: 2015-06-24 | Disposition: A | Discharge status: Home / Self Care | Payer: Self-pay | Attending: Emergency Medicine | Admitting: Emergency Medicine

## 2015-06-24 ENCOUNTER — Emergency Department: Payer: Self-pay

## 2015-06-24 DIAGNOSIS — F17210 Nicotine dependence, cigarettes, uncomplicated: Secondary | ICD-10-CM | POA: Insufficient documentation

## 2015-06-24 DIAGNOSIS — F329 Major depressive disorder, single episode, unspecified: Secondary | ICD-10-CM | POA: Insufficient documentation

## 2015-06-24 DIAGNOSIS — R079 Chest pain, unspecified: Secondary | ICD-10-CM | POA: Insufficient documentation

## 2015-06-24 LAB — BASIC METABOLIC PANEL
ANION GAP: 6 (ref 5–15)
BUN: 17 mg/dL (ref 6–20)
CHLORIDE: 103 mmol/L (ref 101–111)
CO2: 28 mmol/L (ref 22–32)
Calcium: 8.7 mg/dL — ABNORMAL LOW (ref 8.9–10.3)
Creatinine, Ser: 0.64 mg/dL (ref 0.44–1.00)
GFR calc non Af Amer: >60 mL/min (ref >60)
Glucose, Bld: 99 mg/dL (ref 65–99)
Potassium: 3.1 mmol/L — ABNORMAL LOW (ref 3.5–5.1)
SODIUM: 137 mmol/L (ref 135–145)

## 2015-06-24 LAB — TROPONIN I: Troponin I: <0.03 ng/mL (ref <0.031)

## 2015-06-24 LAB — CBC
HCT: 35.6 % (ref 35.0–47.0)
HEMOGLOBIN: 12.3 g/dL (ref 12.0–16.0)
MCH: 30.1 pg (ref 26.0–34.0)
MCHC: 34.5 g/dL (ref 32.0–36.0)
MCV: 87.5 fL (ref 80.0–100.0)
Platelets: 331 10*3/uL (ref 150–440)
RBC: 4.07 MIL/uL (ref 3.80–5.20)
RDW: 14.8 % — ABNORMAL HIGH (ref 11.5–14.5)
WBC: 10.6 10*3/uL (ref 3.6–11.0)

## 2015-06-24 MED ORDER — GI COCKTAIL ~~LOC~~
30.0000 mL | Freq: Once | ORAL | Status: AC
Start: 2015-06-24 — End: 2015-06-24
  Administered 2015-06-24: 30 mL via ORAL
  Filled 2015-06-24: qty 30

## 2015-06-24 NOTE — ED Notes (Signed)
Pt arrives to ER c/o left rib pain intermittent for months. Pt states that pain has not gone away today. Chest tightness when stabbing pain occurs to left rib. Denies injury. Pt alert and oriented X4, active, cooperative, pt in NAD. RR even and unlabored, color WNL.

## 2015-06-24 NOTE — Discharge Instructions (Signed)
Please seek medical attention for any high fevers, chest pain, shortness of breath, change in behavior, persistent vomiting, bloody stool or any other new or concerning symptoms. ° ° °Nonspecific Chest Pain °It is often hard to find the cause of chest pain. There is always a chance that your pain could be related to something serious, such as a heart attack or a blood clot in your lungs. Chest pain can also be caused by conditions that are not life-threatening. If you have chest pain, it is very important to follow up with your doctor. ° °HOME CARE °· If you were prescribed an antibiotic medicine, finish it all even if you start to feel better. °· Avoid any activities that cause chest pain. °· Do not use any tobacco products, including cigarettes, chewing tobacco, or electronic cigarettes. If you need help quitting, ask your doctor. °· Do not drink alcohol. °· Take medicines only as told by your doctor. °· Keep all follow-up visits as told by your doctor. This is important. This includes any further testing if your chest pain does not go away. °· Your doctor may tell you to keep your head raised (elevated) while you sleep. °· Make lifestyle changes as told by your doctor. These may include: °¨ Getting regular exercise. Ask your doctor to suggest some activities that are safe for you. °¨ Eating a heart-healthy diet. Your doctor or a diet specialist (dietitian) can help you to learn healthy eating options. °¨ Maintaining a healthy weight. °¨ Managing diabetes, if necessary. °¨ Reducing stress. °GET HELP IF: °· Your chest pain does not go away, even after treatment. °· You have a rash with blisters on your chest. °· You have a fever. °GET HELP RIGHT AWAY IF: °· Your chest pain is worse. °· You have an increasing cough, or you cough up blood. °· You have severe belly (abdominal) pain. °· You feel extremely weak. °· You pass out (faint). °· You have chills. °· You have sudden, unexplained chest discomfort. °· You have  sudden, unexplained discomfort in your arms, back, neck, or jaw. °· You have shortness of breath at any time. °· You suddenly start to sweat, or your skin gets clammy. °· You feel nauseous. °· You vomit. °· You suddenly feel light-headed or dizzy. °· Your heart begins to beat quickly, or it feels like it is skipping beats. °These symptoms may be an emergency. Do not wait to see if the symptoms will go away. Get medical help right away. Call your local emergency services (911 in the [place] Do not drive yourself to the hospital. °  °This information is not intended to replace advice given to you by your health care provider. Make sure you discuss any questions you have with your health care provider. °  °Document Released: 08/18/2007 Document Revised: 03/22/2014 Document Reviewed: 10/05/2013 °Elsevier Interactive Patient Education ©2016 [employer] ° °

## 2015-06-24 NOTE — ED Provider Notes (Signed)
[facility] Emergency Department Provider Note    ____________________________________________  Time seen: ~2200  I have reviewed the triage vital signs and the nursing notes.   HISTORY  Chief Complaint Chest Pain   History limited by: Not Limited   HPI Alexandra [name] is a 45 y.o. female who presents to the emergency department today because of concerns for left chest pain. She states it is been present for the past 1-2 months. It is intermittent. She describes it as being located below her left breast. She describes it as being sharp in nature. She has not noticed any pattern to the pain. She has not noticed that it is worse with exertion. It is not worse with food. She has not had any associated shortness of breath or cough. No vomiting or change in bowel movement. No fevers.   Past Medical History  Diagnosis Date  . Depression   . Breast discharge 2013    present X 3 years  Left only multiple ducts per pt    There are no active problems to display for this patient.   Past Surgical History  Procedure Laterality Date  . Abdominal hysterectomy    . Breast cyst excision Left 2006    Current Outpatient Rx  Name  Route  Sig  Dispense  Refill  . brompheniramine-pseudoephedrine-DM 30-2-10 MG/5ML syrup   Oral   Take 5 mLs by mouth 4 (four) times daily as needed.   120 mL   0   . citalopram (CELEXA) 40 MG tablet   Oral   Take 40 mg by mouth daily.         Marland Kitchen ibuprofen (ADVIL,MOTRIN) 800 MG tablet   Oral   Take 1 tablet (800 mg total) by mouth every 8 (eight) hours as needed.   30 tablet   0     Allergies Review of patient's allergies indicates no known allergies.  Family History  Problem Relation Age of Onset  . Breast cancer Mother 59    Living  . Breast cancer Maternal Aunt     Living  . Breast cancer Maternal Grandmother     Living  . Breast cancer Maternal Aunt     Maternal Great Aunt Deceased    Social History Social  History  Substance Use Topics  . Smoking status: Current Every Day Smoker -- 1.00 packs/day for 29 years    Types: Cigarettes  . Smokeless tobacco: Never Used  . Alcohol Use: 0.0 oz/week    0 Standard drinks or equivalent per week     Comment: social    Review of Systems  Constitutional: Negative for fever. Cardiovascular: Positive for chest pain Respiratory: Negative for shortness of breath. Gastrointestinal: Negative for abdominal pain, vomiting and diarrhea. Neurological: Negative for headaches, focal weakness or numbness.  10-point ROS otherwise negative.  ____________________________________________   PHYSICAL EXAM:  VITAL SIGNS: ED Triage Vitals  Enc Vitals Group     BP 06/24/15 2135 108/49 mmHg     Pulse Rate 06/24/15 2135 89     Resp 06/24/15 2135 20     Temp 06/24/15 2135 98.4 F (36.9 C)     Temp Source 06/24/15 2135 Oral     SpO2 06/24/15 2135 99 %     Weight 06/24/15 2135 130 lb (58.968 kg)     Height --      Head Cir --      Peak Flow --      Pain Score 06/24/15 2136 10  Constitutional: Alert and oriented. Well appearing and in no distress. Eyes: Conjunctivae are normal. PERRL. Normal extraocular movements. ENT   Head: Normocephalic and atraumatic.   Nose: No congestion/rhinnorhea.   Mouth/Throat: Mucous membranes are moist.   Neck: No stridor. Hematological/Lymphatic/Immunilogical: No cervical lymphadenopathy. Cardiovascular: Normal rate, regular rhythm.  No murmurs, rubs, or gallops. Respiratory: Normal respiratory effort without tachypnea nor retractions. Breath sounds are clear and equal bilaterally. No wheezes/rales/rhonchi. Gastrointestinal: Soft and nontender. No distention.  Genitourinary: Deferred Musculoskeletal: Normal range of motion in all extremities. No joint effusions.  No lower extremity tenderness nor edema. Neurologic:  Normal speech and language. No gross focal neurologic deficits are appreciated.  Skin:  Skin is  warm, dry and intact. No rash noted. Psychiatric: Mood and affect are normal. Speech and behavior are normal. Patient exhibits appropriate insight and judgment.  ____________________________________________    LABS (pertinent positives/negatives)  Labs Reviewed  BASIC METABOLIC PANEL - Abnormal; Notable for the following:    Potassium 3.1 (*)    Calcium 8.7 (*)    All other components within normal limits  CBC - Abnormal; Notable for the following:    RDW 14.8 (*)    All other components within normal limits  TROPONIN I     ____________________________________________   EKG  I, Phineas SemenGraydon Iris Hairston, attending physician, personally viewed and interpreted this EKG  EKG Time: 2138 Rate: 89 Rhythm: NSR Axis: normal Intervals: qtc 459 QRS: narrow ST changes: no st elevation Impression: normal ekg  ____________________________________________    RADIOLOGY  CXR IMPRESSION: No active cardiopulmonary disease.  ____________________________________________   PROCEDURES  Procedure(s) performed: None  Critical Care performed: No  ____________________________________________   INITIAL IMPRESSION / ASSESSMENT AND PLAN / ED COURSE  Pertinent labs & imaging results that were available during my care of the patient were reviewed by me and considered in my medical decision making (see chart for details).  Patient presented to the room because of concerns for chest pain. It has been going on for greater than 1 month. No acute findings on workup today. Mildly hypokalemic. EKG chest x-ray and troponin negative. Will discharge home with primary care follow-up.  ____________________________________________   FINAL CLINICAL IMPRESSION(S) / ED DIAGNOSES  Final diagnoses:  Chest pain, unspecified chest pain type     Phineas SemenGraydon Keylan Costabile, MD 06/24/15 2318

## 2015-07-08 ENCOUNTER — Encounter: Payer: Self-pay | Admitting: General Surgery

## 2015-07-08 ENCOUNTER — Ambulatory Visit: Payer: Self-pay

## 2015-07-08 ENCOUNTER — Ambulatory Visit (INDEPENDENT_AMBULATORY_CARE_PROVIDER_SITE_OTHER): Payer: PRIVATE HEALTH INSURANCE | Admitting: General Surgery

## 2015-07-08 VITALS — BP 118/66 | HR 74 | Resp 12 | Ht 62.0 in | Wt 130.0 lb

## 2015-07-08 DIAGNOSIS — N63 Unspecified lump in breast: Secondary | ICD-10-CM

## 2015-07-08 DIAGNOSIS — N6320 Unspecified lump in the left breast, unspecified quadrant: Secondary | ICD-10-CM

## 2015-07-08 NOTE — Progress Notes (Signed)
Patient ID: Alexandra [name], female   DOB: 09/07/1970, 45 y.o.   MRN: [id number]  Chief Complaint  Patient presents with  . Other    left breast ultrasound    HPI Alexandra [name] is a 45 y.o. female here today for a left breast ultrasound to assess the prior mass noted few mos ago. Pt reports the itching has resolved with use of zinc oxide. I have reviewed the history of present illness with the patient.   HPI  Past Medical History  Diagnosis Date  . Depression   . Breast discharge 2013    present X 3 years  Left only multiple ducts per pt    Past Surgical History  Procedure Laterality Date  . Abdominal hysterectomy    . Breast cyst excision Left 2006    Family History  Problem Relation Age of Onset  . Breast cancer Mother 1444    Living  . Breast cancer Maternal Aunt     Living  . Breast cancer Maternal Grandmother     Living  . Breast cancer Maternal Aunt     Maternal Great Aunt Deceased    Social History Social History  Substance Use Topics  . Smoking status: Current Every Day Smoker -- 1.00 packs/day for 29 years    Types: Cigarettes  . Smokeless tobacco: Never Used  . Alcohol Use: 0.0 oz/week    0 Standard drinks or equivalent per week     Comment: social    No Known Allergies  Current Outpatient Prescriptions  Medication Sig Dispense Refill  . citalopram (CELEXA) 40 MG tablet Take 40 mg by mouth daily.    Marland Kitchen. ibuprofen (ADVIL,MOTRIN) 800 MG tablet Take 1 tablet (800 mg total) by mouth every 8 (eight) hours as needed. 30 tablet 0   No current facility-administered medications for this visit.    Review of Systems Review of Systems  Constitutional: Negative.   Respiratory: Negative.   Cardiovascular: Negative.     Blood pressure 118/66, pulse 74, resp. rate 12, height 5\' 2"  (1.575 m), weight 130 lb (58.968 kg), last menstrual period 01/14/2009.  Physical Exam Physical Exam  Constitutional: She is oriented to person, place, and time. She appears  well-developed and well-nourished.  HENT:  Mouth/Throat: Oropharynx is clear and moist.  Eyes: Conjunctivae are normal. No scleral icterus.  Neck: Neck supple.  Pulmonary/Chest: Right breast exhibits no inverted nipple, no mass, no nipple discharge, no skin change and no tenderness. Left breast exhibits no inverted nipple, no mass, no nipple discharge, no skin change and no tenderness.  Lymphadenopathy:    She has no cervical adenopathy.    She has no axillary adenopathy.  Neurological: She is alert and oriented to person, place, and time.  Skin: Skin is warm and dry.  Psychiatric: Her behavior is normal.    Data Reviewed Prior notes and ultrasound reviewed Repeat US today of left breast-benign appearing nodule, measures 0.33cm in max size. No significant change from before. Assessment    Stable small mass left breast near nipple, benign appearing    Plan   Patient will be asked to return to the office in 6 months with a bilateral mammogram(left DX).       PCP:  No Pcp This information has been scribed by Ples SpecterJessica Qualls CMA.    SANKAR,SEEPLAPUTHUR G 07/08/2015, 7:16 PM

## 2015-07-08 NOTE — Patient Instructions (Addendum)
The patient is aware to call back for any questions or concerns. Patient will be asked to return to the office in 6 months with a bilateral (Left diagnostic) mammogram

## 2015-08-01 ENCOUNTER — Ambulatory Visit: Payer: Self-pay | Attending: Oncology

## 2015-08-01 ENCOUNTER — Other Ambulatory Visit: Payer: Self-pay

## 2015-12-02 ENCOUNTER — Encounter: Payer: Self-pay | Admitting: *Deleted

## 2015-12-18 ENCOUNTER — Ambulatory Visit
Admission: RE | Admit: 2015-12-18 | Discharge: 2015-12-18 | Disposition: A | Discharge status: Home / Self Care | Payer: Self-pay | Source: Ambulatory Visit | Attending: Oncology | Admitting: Oncology

## 2015-12-18 DIAGNOSIS — N63 Unspecified lump in unspecified breast: Secondary | ICD-10-CM

## 2015-12-23 ENCOUNTER — Encounter: Payer: Self-pay | Admitting: *Deleted

## 2015-12-24 ENCOUNTER — Ambulatory Visit (INDEPENDENT_AMBULATORY_CARE_PROVIDER_SITE_OTHER): Payer: PRIVATE HEALTH INSURANCE | Admitting: General Surgery

## 2015-12-24 ENCOUNTER — Encounter: Payer: Self-pay | Admitting: General Surgery

## 2015-12-24 VITALS — BP 124/80 | HR 76 | Resp 12 | Ht 62.0 in | Wt 130.0 lb

## 2015-12-24 DIAGNOSIS — N6320 Unspecified lump in the left breast, unspecified quadrant: Secondary | ICD-10-CM

## 2015-12-24 DIAGNOSIS — N6452 Nipple discharge: Secondary | ICD-10-CM

## 2015-12-24 DIAGNOSIS — Z803 Family history of malignant neoplasm of breast: Secondary | ICD-10-CM

## 2015-12-24 NOTE — Progress Notes (Signed)
Patient ID: Alexandra [name], female   DOB: [birth year], 45 y.o.   MRN: [id number]  Chief Complaint  Patient presents with  . Follow-up    mammogram    HPI Alexandra [name] is a 45 y.o. female who presents for evaluation of left breast discharge. The most recent mammogram was done on 12/18/15.  Patient does perform regular self breast checks and gets regular mammograms done.  Patient states no new issues.  I have reviewed the history of present illness with the patient.  HPI  Past Medical History:  Diagnosis Date  . Breast discharge 2013   present X 3 years  Left only multiple ducts per pt  . Depression     Past Surgical History:  Procedure Laterality Date  . ABDOMINAL HYSTERECTOMY    . BREAST CYST EXCISION Left 2006    Family History  Problem Relation Age of Onset  . Breast cancer Mother 7344    Living  . Breast cancer Maternal Aunt     Living  . Breast cancer Maternal Grandmother     Living  . Breast cancer Maternal Aunt     Maternal Great Aunt Deceased    Social History Social History  Substance Use Topics  . Smoking status: Current Every Day Smoker    Packs/day: 1.00    Years: 29.00    Types: Cigarettes  . Smokeless tobacco: Never Used  . Alcohol use 0.0 oz/week     Comment: social    No Known Allergies  Current Outpatient Prescriptions  Medication Sig Dispense Refill  . citalopram (CELEXA) 40 MG tablet Take 40 mg by mouth daily.    Marland Kitchen. ibuprofen (ADVIL,MOTRIN) 800 MG tablet Take 1 tablet (800 mg total) by mouth every 8 (eight) hours as needed. 30 tablet 0   No current facility-administered medications for this visit.     Review of Systems Review of Systems  Constitutional: Negative.   Respiratory: Negative.   Cardiovascular: Negative.     Blood pressure 124/80, pulse 76, resp. rate 12, height 5\' 2"  (1.575 m), weight 130 lb (59 kg), last menstrual period 01/14/2009.  Physical Exam Physical Exam  Constitutional: She is oriented to person, place, and  time. She appears well-developed and well-nourished.  Eyes: Conjunctivae are normal. No scleral icterus.  Neck: Neck supple.  Cardiovascular: Normal rate and regular rhythm.   Pulmonary/Chest: Effort normal and breath sounds normal. Right breast exhibits no inverted nipple, no mass, no nipple discharge, no skin change and no tenderness. Left breast exhibits nipple discharge (scant yellow dicharge). Left breast exhibits no inverted nipple, no mass, no skin change and no tenderness.  Abdominal: Soft. Bowel sounds are normal. There is no tenderness.  Lymphadenopathy:    She has no cervical adenopathy.    She has no axillary adenopathy.  Neurological: She is alert and oriented to person, place, and time.  Skin: Skin is warm and dry.    Data Reviewed Mammogram of left breast reviewed  Assessment    Chronic left nipple discharge, benign appearing Left breast cyst, small and stable seen only on imaging Family history of breast cancer    Plan   Patient needs mammogram of right breast. This will be arranged. Bilateral screening mammogram in 1 year. Call if discharge persists. Recommended genetic testing for extensive family history of breast cancer (mother, GM, maternal aunt).       Gen Clagg G 12/24/2015, 2:58 PM

## 2015-12-24 NOTE — Patient Instructions (Signed)
  Bilateral screening mammogram in 1 year. Call if discharge persists. Recommended genetic testing for extensive family history of breast cancer.

## 2015-12-25 ENCOUNTER — Telehealth: Payer: Self-pay | Admitting: *Deleted

## 2015-12-25 NOTE — Telephone Encounter (Signed)
Dr Evette CristalSankar was wondering if patient would qualify for genetic testing through the [facility] program?

## 2015-12-26 ENCOUNTER — Emergency Department
Admission: EM | Admit: 2015-12-26 | Discharge: 2015-12-27 | Disposition: A | Discharge status: Home / Self Care | Payer: Self-pay | Attending: Student | Admitting: Student

## 2015-12-26 ENCOUNTER — Encounter: Payer: Self-pay | Admitting: *Deleted

## 2015-12-26 DIAGNOSIS — R519 Headache, unspecified: Secondary | ICD-10-CM

## 2015-12-26 DIAGNOSIS — F17210 Nicotine dependence, cigarettes, uncomplicated: Secondary | ICD-10-CM | POA: Insufficient documentation

## 2015-12-26 DIAGNOSIS — R51 Headache: Secondary | ICD-10-CM | POA: Insufficient documentation

## 2015-12-26 NOTE — ED Triage Notes (Signed)
Pt reports a headache since yesterday.  Pt has a tender area on right side of head.  No known injury . Pt has nausea. Pt alert.  Speech clear.  Pt took otc meds without relief.   Pt ambulates without diff.

## 2015-12-27 ENCOUNTER — Emergency Department: Payer: Self-pay

## 2015-12-27 LAB — COMPREHENSIVE METABOLIC PANEL
ALBUMIN: 3.6 g/dL (ref 3.5–5.0)
ALK PHOS: 64 U/L (ref 38–126)
ALT: 17 U/L (ref 14–54)
AST: 19 U/L (ref 15–41)
Anion gap: 7 (ref 5–15)
BILIRUBIN TOTAL: 0.1 mg/dL — AB (ref 0.3–1.2)
BUN: 13 mg/dL (ref 6–20)
CALCIUM: 8.8 mg/dL — AB (ref 8.9–10.3)
CO2: 27 mmol/L (ref 22–32)
CREATININE: 0.68 mg/dL (ref 0.44–1.00)
Chloride: 104 mmol/L (ref 101–111)
GFR calc Af Amer: >60 mL/min (ref >60)
GFR calc non Af Amer: >60 mL/min (ref >60)
GLUCOSE: 109 mg/dL — AB (ref 65–99)
Potassium: 3.8 mmol/L (ref 3.5–5.1)
SODIUM: 138 mmol/L (ref 135–145)
Total Protein: 6.8 g/dL (ref 6.5–8.1)

## 2015-12-27 LAB — CBC WITH DIFFERENTIAL/PLATELET
BASOS PCT: 1 %
Basophils Absolute: 0.1 10*3/uL (ref 0–0.1)
EOS ABS: 0.7 10*3/uL (ref 0–0.7)
Eosinophils Relative: 6 %
HEMATOCRIT: 38.3 % (ref 35.0–47.0)
HEMOGLOBIN: 13 g/dL (ref 12.0–16.0)
LYMPHS ABS: 3.3 10*3/uL (ref 1.0–3.6)
Lymphocytes Relative: 31 %
MCH: 29.8 pg (ref 26.0–34.0)
MCHC: 33.9 g/dL (ref 32.0–36.0)
MCV: 88 fL (ref 80.0–100.0)
Monocytes Absolute: 0.8 10*3/uL (ref 0.2–0.9)
Monocytes Relative: 7 %
NEUTROS ABS: 5.9 10*3/uL (ref 1.4–6.5)
NEUTROS PCT: 55 %
Platelets: 313 10*3/uL (ref 150–440)
RBC: 4.35 MIL/uL (ref 3.80–5.20)
RDW: 14.8 % — ABNORMAL HIGH (ref 11.5–14.5)
WBC: 10.7 10*3/uL (ref 3.6–11.0)

## 2015-12-27 LAB — C-REACTIVE PROTEIN: CRP: <0.8 mg/dL (ref <1.0)

## 2015-12-27 LAB — SEDIMENTATION RATE: Sed Rate: 16 mm/hr (ref 0–20)

## 2015-12-27 MED ORDER — SODIUM CHLORIDE 0.9 % IV BOLUS (SEPSIS)
1000.0000 mL | Freq: Once | INTRAVENOUS | Status: AC
  Administered 2015-12-27: 1000 mL via INTRAVENOUS

## 2015-12-27 MED ORDER — METOCLOPRAMIDE HCL 5 MG/ML IJ SOLN
10.0000 mg | Freq: Once | INTRAMUSCULAR | Status: AC
  Administered 2015-12-27: 10 mg via INTRAVENOUS
  Filled 2015-12-27: qty 2

## 2015-12-27 MED ORDER — KETOROLAC TROMETHAMINE 30 MG/ML IJ SOLN
15.0000 mg | Freq: Once | INTRAMUSCULAR | Status: AC
  Administered 2015-12-27: 15 mg via INTRAVENOUS
  Filled 2015-12-27: qty 1

## 2015-12-27 MED ORDER — DIPHENHYDRAMINE HCL 50 MG/ML IJ SOLN
12.5000 mg | Freq: Once | INTRAMUSCULAR | Status: AC
  Administered 2015-12-27: 12.5 mg via INTRAVENOUS
  Filled 2015-12-27: qty 1

## 2015-12-27 NOTE — ED Notes (Signed)
Patient's presenting symptoms completely resolved. Patient denies that she is currently experiencing a headache. MD aware.

## 2015-12-27 NOTE — ED Notes (Signed)
Patient observed sleeping at this time. Will continue to monitor.

## 2015-12-27 NOTE — ED Notes (Signed)

## 2015-12-27 NOTE — Discharge Instructions (Signed)
As we discussed, follow-up with a primary care doctor and ophthalmologist as soon as possible regarding today's visit. Return immediately to the emergency department if you develop recurrent severe headache, any vision change, any numbness or weakness in the arms or legs, any nausea or vomiting, any speech difficulty, chest pain, shortness of breath, fever or for any other concerns.

## 2015-12-27 NOTE — ED Provider Notes (Addendum)
[facility] Emergency Department Provider Note   ____________________________________________   First MD Initiated Contact with Patient 12/27/15 0037     (approximate)  I have reviewed the triage vital signs and the nursing notes.   HISTORY  Chief Complaint Headache    HPI Alexandra [name] is a 45 y.o. female with history of depression who presents for evaluation of nearly 2 days of gradual onset right-sided headache, constant, moderate, no modifying factors. Patient reports that she feels "soreness" at the hairline in the right frontal scalp. She also reports that her vision has been " a littlefoggy" in both eyes denies any loss of vision in either eye, no eye pain, no floaters. She denies any neck pain or neck stiffness, no fevers. No nausea, vomiting, diarrhea, fevers or chills. Current headache is 8 out of 10. No history of CVA, no family history of bleeding disorder, early CVAs, aneurysms. She denies any numbness, weakness, or speech difficulty.   Past Medical History:  Diagnosis Date  . Breast discharge 2013   present X 3 years  Left only multiple ducts per pt  . Depression     There are no active problems to display for this patient.   Past Surgical History:  Procedure Laterality Date  . ABDOMINAL HYSTERECTOMY    . BREAST CYST EXCISION Left 2006    Prior to Admission medications   Medication Sig Start Date End Date Taking? Authorizing Provider  citalopram (CELEXA) 40 MG tablet Take 40 mg by mouth daily.    Historical Provider, MD  ibuprofen (ADVIL,MOTRIN) 800 MG tablet Take 1 tablet (800 mg total) by mouth every 8 (eight) hours as needed. 05/07/15   Sable Feil, PA-C    Allergies Review of patient's allergies indicates no known allergies.  Family History  Problem Relation Age of Onset  . Breast cancer Mother 57    Living  . Breast cancer Maternal Aunt     Living  . Breast cancer Maternal Grandmother     Living  . Breast cancer  Maternal Aunt     Maternal Great Aunt Deceased    Social History Social History  Substance Use Topics  . Smoking status: Current Every Day Smoker    Packs/day: 1.00    Years: 29.00    Types: Cigarettes  . Smokeless tobacco: Never Used  . Alcohol use 0.0 oz/week     Comment: social    Review of Systems Constitutional: No fever/chills Eyes: No visual changes. ENT: No sore throat. Cardiovascular: Denies chest pain. Respiratory: Denies shortness of breath. Gastrointestinal: No abdominal pain.  No nausea, no vomiting.  No diarrhea.  No constipation. Genitourinary: Negative for dysuria. Musculoskeletal: Negative for back pain. Skin: Negative for rash. Neurological: Positive for headache, no focal weakness or numbness.  10-point ROS otherwise negative.  ____________________________________________   PHYSICAL EXAM:  Vitals:   12/26/15 2151 12/26/15 2152 12/27/15 0314  BP: (!) 110/58  (!) 104/56  Pulse: 82  76  Resp: 18  16  Temp: 98.6 F (37 C)    TempSrc: Oral    SpO2: 99%  98%  Weight:  130 lb (59 kg)   Height:  '5\' 3"'$  (1.6 m)     VITAL SIGNS: ED Triage Vitals  Enc Vitals Group     BP 12/26/15 2151 (!) 110/58     Pulse Rate 12/26/15 2151 82     Resp 12/26/15 2151 18     Temp 12/26/15 2151 98.6 F (37 C)  Temp Source 12/26/15 2151 Oral     SpO2 12/26/15 2151 99 %     Weight 12/26/15 2152 130 lb (59 kg)     Height 12/26/15 2152 5\' 3"  (1.6 m)     Head Circumference --      Peak Flow --      Pain Score 12/26/15 2153 8     Pain Loc --      Pain Edu? --      Excl. in GC? --     Constitutional: Alert and oriented. Well appearing and in no acute distress.Fully clothed, sitting up in a bedside chair watching television. Eyes: Conjunctivae are normal. PERRL. EOMI. Head: Atraumatic. Tender in the right frontal scalp without redness or swelling. No tenderness or swelling over the right temple. Nose: No congestion/rhinnorhea. Mouth/Throat: Mucous membranes are  moist.  Oropharynx non-erythematous. Neck: No stridor. Supple without meningismus. Cardiovascular: Normal rate, regular rhythm. Grossly normal heart sounds.  Good peripheral circulation. Respiratory: Normal respiratory effort.  No retractions. Lungs CTAB. Gastrointestinal: Soft and nontender. No distention.  No CVA tenderness. Genitourinary: deferred Musculoskeletal: No lower extremity tenderness nor edema.  No joint effusions. Neurologic:  Normal speech and language. No gross focal neurologic deficits are appreciated. No gait instability. 5/5 strength in bilateral upper and lower extremities, sensation intact to light touch throughout. Radial nerves II through XII intact. Skin:  Skin is warm, dry and intact. No rash noted. Psychiatric: Mood and affect are normal. Speech and behavior are normal.  ____________________________________________   LABS (all labs ordered are listed, but only abnormal results are displayed)  Labs Reviewed  CBC WITH DIFFERENTIAL/PLATELET  COMPREHENSIVE METABOLIC PANEL  SEDIMENTATION RATE  C-REACTIVE PROTEIN   ____________________________________________  EKG  none ____________________________________________  RADIOLOGY  CT head IMPRESSION:  Unremarkable noncontrast CT of the head.      ____________________________________________   PROCEDURES  Procedure(s) performed: None  Procedures  Critical Care performed: No  ____________________________________________   INITIAL IMPRESSION / ASSESSMENT AND PLAN / ED COURSE  Pertinent labs & imaging results that were available during my care of the patient were reviewed by me and considered in my medical decision making (see chart for details).  Alexandra [name] is a 45 y.o. female with history of depression who presents for evaluation of nearly 2 days of gradual onset right-sided headache. On Exam, she is very well-appearing and in no acute distress. Vital signs stable and she is afebrile. Neck  supple without meningismus, doubt meningitis. Intact neurological examination. She does not have tenderness over the right temple however given her complaints, I have ordered his ESR and CRP as temporal arteritis is on the differential however she is younger than the population which this typically affects. She has mild nonspecific tenderness in the right frontal scalp. Will Check her visual acuity. We'll obtain CT head and treat with migraine cocktail, reassess for disposition.  ----------------------------------------- 3:06 AM on 12/27/2015 ----------------------------------------- Patient continues to appear well. She reports complete resolution of her headache at this time after Reglan, Toradol and Benadryl. CT head negative. Visual acuity exam was reassuring 20/50 in the right eye, 20/70 in the left eye, 20/30 in both eyes. I doubt that this represents subarachnoid hemorrhage. ESR was within normal limits at 16, CRP is a send out test but in the absence of an ESR elevation, no temporal tenderness, no significant visual deficit, I doubt that this represents temporal arteritis. However, given the patient's complaints, I will refer her to ophthalmology. We discussed meticulous return precautions and  need for close PCP follow-up as well as ophthalmology follow-up and she is comfortable with the discharge plan. DC home.  Clinical Course     ____________________________________________   FINAL CLINICAL IMPRESSION(S) / ED DIAGNOSES  Final diagnoses:  Acute nonintractable headache, unspecified headache type      NEW MEDICATIONS STARTED DURING THIS VISIT:  New Prescriptions   No medications on file     Note:  This document was prepared using Dragon voice recognition software and may include unintentional dictation errors.    Joanne Gavel, MD 12/27/15 8335    Joanne Gavel, MD 12/27/15 [phone]

## 2015-12-30 ENCOUNTER — Telehealth: Payer: Self-pay | Admitting: *Deleted

## 2015-12-30 ENCOUNTER — Telehealth: Payer: Self-pay

## 2015-12-30 NOTE — Telephone Encounter (Signed)
Talked with Dr. Luan MooreSankar's staff.  He would like for the patient to have a uni right screening mammogram in November.  [facility]  Left patient a message via her mom to call to schedule an appointment.

## 2015-12-30 NOTE — Telephone Encounter (Signed)
[name] with [facility] called and said that the patient's mother was BRCA Negative and that she wasn't sure if she still needed genetic testing done. I spoke with Dr Jamal Collin and said that in light of this it was not necessary.

## 2016-02-02 ENCOUNTER — Ambulatory Visit
Admission: RE | Admit: 2016-02-02 | Discharge: 2016-02-02 | Disposition: A | Discharge status: Home / Self Care | Payer: Self-pay | Source: Ambulatory Visit | Attending: Oncology | Admitting: Oncology

## 2016-02-02 ENCOUNTER — Ambulatory Visit: Payer: Self-pay | Attending: Oncology | Admitting: *Deleted

## 2016-02-02 DIAGNOSIS — N63 Unspecified lump in unspecified breast: Secondary | ICD-10-CM

## 2016-02-02 NOTE — Progress Notes (Signed)
Subjective:     Patient ID: Alexandra [name], female   DOB: Jul 28, 1970, 45 y.o.   MRN: [id number]  HPI   Review of Systems     Objective:   Physical Exam  Pulmonary/Chest: Right breast exhibits no inverted nipple, no mass, no nipple discharge, no skin change and no tenderness. Left breast exhibits nipple discharge. Left breast exhibits no inverted nipple, no mass, no skin change and no tenderness. Breasts are symmetrical.         Assessment:     79 year White female returns to [facility] for annual screening.  Last mammogram on 12/18/15 was a birads 3 requesting follow-up in 1 month.  Patient with significant family history of breast cancer.  Mom has had breast cancer 3 times, but is BRCA negative.  Her maternal aunt has had breast cancer, her maternal grandmother, and maternal great aunt have had breast cancer.  Encouraged patient to get annual screening mammograms due to family history.  Clinical breast exam unremarkable.  Taught self breast awareness.  Patient complains spontaneous left nipple discharge for about 3-4 years.  None noted on exam today.  Patient has a history of cervical cancer at age 29.  States she had a hysterectomy in her 80's, for reasons other than cancer.     Plan:     Will get bilateral diagnostic mammogram and ultrasound as requested by radiology.  Will follow-up per [facility] protocol.

## 2016-02-02 NOTE — Patient Instructions (Signed)
Gave patient hand-out, Women Staying Healthy, Active and Well from [facility], with education on breast health, pap smears, heart and colon health. 

## 2016-02-03 ENCOUNTER — Encounter: Payer: Self-pay | Admitting: *Deleted

## 2016-02-03 NOTE — Progress Notes (Signed)
Letter mailed to inform patient of her mammogram results and need to return in one year for annual diagnostic mammogram.  Appointment scheduled for 01/31/17 @ 8:00 for [facility] and mammogram.  [facility] to [place].

## 2016-02-09 ENCOUNTER — Encounter: Payer: Self-pay | Admitting: Internal Medicine

## 2016-02-09 ENCOUNTER — Ambulatory Visit (INDEPENDENT_AMBULATORY_CARE_PROVIDER_SITE_OTHER): Payer: Self-pay | Admitting: Internal Medicine

## 2016-02-09 VITALS — BP 136/88 | HR 89 | Temp 98.0°F | Resp 16 | Ht 63.0 in | Wt 129.6 lb

## 2016-02-09 DIAGNOSIS — Z720 Tobacco use: Secondary | ICD-10-CM | POA: Insufficient documentation

## 2016-02-09 DIAGNOSIS — F324 Major depressive disorder, single episode, in partial remission: Secondary | ICD-10-CM

## 2016-02-09 DIAGNOSIS — M62838 Other muscle spasm: Secondary | ICD-10-CM

## 2016-02-09 DIAGNOSIS — F17200 Nicotine dependence, unspecified, uncomplicated: Secondary | ICD-10-CM

## 2016-02-09 DIAGNOSIS — J40 Bronchitis, not specified as acute or chronic: Secondary | ICD-10-CM

## 2016-02-09 MED ORDER — TRAMADOL HCL 50 MG PO TABS: 50.0000 mg | ORAL_TABLET | Freq: Four times a day (QID) | ORAL | 0 refills | Status: DC | PRN

## 2016-02-09 MED ORDER — AMOXICILLIN-POT CLAVULANATE 875-125 MG PO TABS: 1.0000 | ORAL_TABLET | Freq: Two times a day (BID) | ORAL | 0 refills | Status: DC

## 2016-02-09 MED ORDER — METHOCARBAMOL 750 MG PO TABS: 750.0000 mg | ORAL_TABLET | Freq: Four times a day (QID) | ORAL | 0 refills | Status: DC

## 2016-02-09 NOTE — Progress Notes (Signed)
Date:  02/09/2016   Name:  Alexandra [name]   DOB:  Aug 26, 1970   MRN:  [id number]   Chief Complaint: Establish Care (Never had PCP ) and Arm Pain (L Arm and L Rib pain. Started wed...weakness and severe pain casuing nausea. Hurts to move arm and some numbness. Denies injury or heavy lifting. 10/10 pain level. )  Arm Pain   There was no injury mechanism. The pain is present in the left shoulder and upper left arm. The quality of the pain is described as burning. The pain radiates to the chest. The pain is severe. The pain has been fluctuating since the incident. Associated symptoms include chest pain. The symptoms are aggravated by movement and lifting. She has tried NSAIDs, heat and ice for the symptoms.  She also has pain under left axilla radiating to under her left breast - if she lifts her breast she has pulling pain in neck and shoulder.   Review of Systems  Constitutional: Positive for chills. Negative for fever.  Respiratory: Negative for cough, chest tightness, shortness of breath and wheezing.   Cardiovascular: Positive for chest pain. Negative for palpitations and leg swelling.    There are no active problems to display for this patient.   Prior to Admission medications   Medication Sig Start Date End Date Taking? Authorizing Provider  citalopram (CELEXA) 40 MG tablet Take 40 mg by mouth daily.   Yes Historical Provider, MD  ibuprofen (ADVIL,MOTRIN) 800 MG tablet Take 1 tablet (800 mg total) by mouth every 8 (eight) hours as needed. 05/07/15  Yes Joni Reiningonald K Smith, PA-C    No Known Allergies  Past Surgical History:  Procedure Laterality Date  . ABDOMINAL HYSTERECTOMY    . BREAST CYST EXCISION Left 2006    Social History  Substance Use Topics  . Smoking status: Current Every Day Smoker    Packs/day: 1.00    Years: 29.00    Types: Cigarettes  . Smokeless tobacco: Never Used  . Alcohol use 0.0 oz/week     Comment: social     Medication list has been reviewed and  updated.   Physical Exam  Constitutional: She is oriented to person, place, and time. She appears well-developed. No distress.  HENT:  Head: Normocephalic and atraumatic.  Cardiovascular: Normal rate, regular rhythm and normal heart sounds.   Pulmonary/Chest: Effort normal. No respiratory distress. She has wheezes. She has no rales. She exhibits no tenderness.  Musculoskeletal:       Left shoulder: Normal. She exhibits normal range of motion and no tenderness.       Left elbow: Normal.       Left wrist: Normal.       Cervical back: She exhibits decreased range of motion, tenderness and spasm.  Neurological: She is alert and oriented to person, place, and time. She displays no tremor. No cranial nerve deficit or sensory deficit. She exhibits normal muscle tone.  Skin: Skin is warm and dry. No rash noted.  Psychiatric: She has a normal mood and affect. Her behavior is normal. Thought content normal.  Nursing note and vitals reviewed.   BP 136/88   Pulse 89   Temp 98 F (36.7 C) (Oral)   Resp 16   Ht 5\' 3"  (1.6 m)   Wt 129 lb 9.6 oz (58.8 kg)   LMP 01/14/2009   SpO2 98%   BMI 22.96 kg/m   Assessment and Plan: 1. Neck muscle spasm Instructions for care given Note to  be out of work for 2 days - methocarbamol (ROBAXIN) 750 MG tablet; Take 1 tablet (750 mg total) by mouth 4 (four) times daily.  Dispense: 60 tablet; Refill: 0 - traMADol (ULTRAM) 50 MG tablet; Take 1 tablet (50 mg total) by mouth every 6 (six) hours as needed.  Dispense: 40 tablet; Refill: 0  2. Bronchitis Call for CXR is no improvement - amoxicillin-clavulanate (AUGMENTIN) 875-125 MG tablet; Take 1 tablet by mouth 2 (two) times daily.  Dispense: 20 tablet; Refill: 0  3. Major depressive disorder with single episode, in partial remission (HCC) Followed by [facility]  4. Tobacco use disorder Encouraged patient to consider quitting   Bari EdwardLaura Amani Marseille, MD [facility]  02/09/2016

## 2016-02-09 NOTE — Patient Instructions (Signed)
Use Ice or heat - on the upper shoulder and back/neck for 20 minutes 3-4 times per day.  Take Muscle relaxant as instructed for the next few days.  Take Advil 800 mg three times a day  Take Tramadol as needed for severe pain

## 2016-02-16 ENCOUNTER — Emergency Department: Payer: Self-pay

## 2016-02-16 ENCOUNTER — Observation Stay
Admission: EM | Admit: 2016-02-16 | Discharge: 2016-02-19 | Disposition: A | Discharge status: Home / Self Care | Payer: Self-pay | Attending: Internal Medicine | Admitting: Internal Medicine

## 2016-02-16 ENCOUNTER — Encounter: Payer: Self-pay | Admitting: Emergency Medicine

## 2016-02-16 DIAGNOSIS — M4722 Other spondylosis with radiculopathy, cervical region: Secondary | ICD-10-CM | POA: Insufficient documentation

## 2016-02-16 DIAGNOSIS — F17210 Nicotine dependence, cigarettes, uncomplicated: Secondary | ICD-10-CM | POA: Insufficient documentation

## 2016-02-16 DIAGNOSIS — F339 Major depressive disorder, recurrent, unspecified: Secondary | ICD-10-CM | POA: Diagnosis present

## 2016-02-16 DIAGNOSIS — R52 Pain, unspecified: Secondary | ICD-10-CM

## 2016-02-16 DIAGNOSIS — M62838 Other muscle spasm: Secondary | ICD-10-CM | POA: Diagnosis present

## 2016-02-16 DIAGNOSIS — F32A Depression, unspecified: Secondary | ICD-10-CM | POA: Diagnosis present

## 2016-02-16 DIAGNOSIS — M4802 Spinal stenosis, cervical region: Secondary | ICD-10-CM | POA: Insufficient documentation

## 2016-02-16 DIAGNOSIS — M50122 Cervical disc disorder at C5-C6 level with radiculopathy: Principal | ICD-10-CM | POA: Insufficient documentation

## 2016-02-16 DIAGNOSIS — F329 Major depressive disorder, single episode, unspecified: Secondary | ICD-10-CM | POA: Insufficient documentation

## 2016-02-16 DIAGNOSIS — Z803 Family history of malignant neoplasm of breast: Secondary | ICD-10-CM | POA: Insufficient documentation

## 2016-02-16 DIAGNOSIS — M5412 Radiculopathy, cervical region: Secondary | ICD-10-CM

## 2016-02-16 DIAGNOSIS — M542 Cervicalgia: Secondary | ICD-10-CM | POA: Diagnosis present

## 2016-02-16 DIAGNOSIS — Z90710 Acquired absence of both cervix and uterus: Secondary | ICD-10-CM | POA: Insufficient documentation

## 2016-02-16 DIAGNOSIS — M5020 Other cervical disc displacement, unspecified cervical region: Secondary | ICD-10-CM | POA: Diagnosis present

## 2016-02-16 DIAGNOSIS — M2578 Osteophyte, vertebrae: Secondary | ICD-10-CM | POA: Insufficient documentation

## 2016-02-16 DIAGNOSIS — M5410 Radiculopathy, site unspecified: Secondary | ICD-10-CM | POA: Diagnosis present

## 2016-02-16 LAB — FIBRIN DERIVATIVES D-DIMER (ARMC ONLY): Fibrin derivatives D-dimer (ARMC): 382 (ref 0–499)

## 2016-02-16 LAB — COMPREHENSIVE METABOLIC PANEL
ALBUMIN: 3.8 g/dL (ref 3.5–5.0)
ALK PHOS: 78 U/L (ref 38–126)
ALT: 17 U/L (ref 14–54)
ANION GAP: 8 (ref 5–15)
AST: 22 U/L (ref 15–41)
BUN: 11 mg/dL (ref 6–20)
CALCIUM: 8.7 mg/dL — AB (ref 8.9–10.3)
CO2: 27 mmol/L (ref 22–32)
Chloride: 100 mmol/L — ABNORMAL LOW (ref 101–111)
Creatinine, Ser: 0.5 mg/dL (ref 0.44–1.00)
GLUCOSE: 96 mg/dL (ref 65–99)
Potassium: 3.8 mmol/L (ref 3.5–5.1)
Sodium: 135 mmol/L (ref 135–145)
TOTAL PROTEIN: 7.4 g/dL (ref 6.5–8.1)

## 2016-02-16 LAB — CBC WITH DIFFERENTIAL/PLATELET
Basophils Absolute: 0.1 10*3/uL (ref 0–0.1)
Basophils Relative: 1 %
Eosinophils Absolute: 0.5 10*3/uL (ref 0–0.7)
Eosinophils Relative: 5 %
HEMATOCRIT: 38.7 % (ref 35.0–47.0)
HEMOGLOBIN: 13.3 g/dL (ref 12.0–16.0)
LYMPHS ABS: 4 10*3/uL — AB (ref 1.0–3.6)
LYMPHS PCT: 40 %
MCH: 30.3 pg (ref 26.0–34.0)
MCHC: 34.4 g/dL (ref 32.0–36.0)
MCV: 88.1 fL (ref 80.0–100.0)
MONO ABS: 0.7 10*3/uL (ref 0.2–0.9)
MONOS PCT: 7 %
NEUTROS ABS: 4.8 10*3/uL (ref 1.4–6.5)
NEUTROS PCT: 47 %
Platelets: 314 10*3/uL (ref 150–440)
RBC: 4.4 MIL/uL (ref 3.80–5.20)
RDW: 14.3 % (ref 11.5–14.5)
WBC: 10.1 10*3/uL (ref 3.6–11.0)

## 2016-02-16 LAB — TROPONIN I

## 2016-02-16 MED ORDER — ONDANSETRON 4 MG PO TBDP
4.0000 mg | ORAL_TABLET | Freq: Once | ORAL | Status: AC
  Administered 2016-02-16: 4 mg via ORAL
  Filled 2016-02-16: qty 1

## 2016-02-16 MED ORDER — MORPHINE SULFATE (PF) 4 MG/ML IV SOLN
4.0000 mg | Freq: Once | INTRAVENOUS | Status: AC
  Administered 2016-02-16: 4 mg via INTRAMUSCULAR
  Filled 2016-02-16: qty 1

## 2016-02-16 MED ORDER — DIAZEPAM 2 MG PO TABS
2.0000 mg | ORAL_TABLET | Freq: Once | ORAL | Status: AC
  Administered 2016-02-16: 2 mg via ORAL
  Filled 2016-02-16: qty 1

## 2016-02-16 MED ORDER — ENOXAPARIN SODIUM 40 MG/0.4ML ~~LOC~~ SOLN
40.0000 mg | Freq: Every day | SUBCUTANEOUS | Status: DC
  Administered 2016-02-16 – 2016-02-18 (×3): 40 mg via SUBCUTANEOUS
  Filled 2016-02-16 (×3): qty 0.4

## 2016-02-16 MED ORDER — ONDANSETRON HCL 4 MG/2ML IJ SOLN: 4.0000 mg | Freq: Four times a day (QID) | INTRAMUSCULAR | Status: DC | PRN

## 2016-02-16 MED ORDER — ONDANSETRON HCL 4 MG PO TABS: 4.0000 mg | ORAL_TABLET | Freq: Four times a day (QID) | ORAL | Status: DC | PRN

## 2016-02-16 MED ORDER — ACETAMINOPHEN 650 MG RE SUPP: 650.0000 mg | Freq: Four times a day (QID) | RECTAL | Status: DC | PRN

## 2016-02-16 MED ORDER — ACETAMINOPHEN 325 MG PO TABS: 650.0000 mg | ORAL_TABLET | Freq: Four times a day (QID) | ORAL | Status: DC | PRN

## 2016-02-16 MED ORDER — HYDROMORPHONE HCL 1 MG/ML IJ SOLN
1.0000 mg | Freq: Once | INTRAMUSCULAR | Status: AC
  Administered 2016-02-16: 1 mg via INTRAVENOUS
  Filled 2016-02-16: qty 1

## 2016-02-16 MED ORDER — CITALOPRAM HYDROBROMIDE 20 MG PO TABS
40.0000 mg | ORAL_TABLET | Freq: Every day | ORAL | Status: DC
  Administered 2016-02-17 – 2016-02-19 (×3): 40 mg via ORAL
  Filled 2016-02-16 (×4): qty 2

## 2016-02-16 MED ORDER — HYDROMORPHONE HCL 1 MG/ML IJ SOLN
1.0000 mg | INTRAMUSCULAR | Status: DC | PRN
  Administered 2016-02-16 – 2016-02-18 (×6): 1 mg via INTRAVENOUS
  Filled 2016-02-16 (×6): qty 1

## 2016-02-16 MED ORDER — CYCLOBENZAPRINE HCL 10 MG PO TABS
10.0000 mg | ORAL_TABLET | Freq: Three times a day (TID) | ORAL | Status: DC | PRN
  Administered 2016-02-16 – 2016-02-19 (×4): 10 mg via ORAL
  Filled 2016-02-16 (×4): qty 1

## 2016-02-16 NOTE — ED Notes (Signed)
Pt returns from CT; uprite on stretcher & appears more comfortable, rating pain now 6/10 and tolerable; st pain now only with movement with persistent numbness to fingers

## 2016-02-16 NOTE — ED Provider Notes (Signed)
[facility] Emergency Department Provider Note   ____________________________________________    I have reviewed the triage vital signs and the nursing notes.   HISTORY  Chief Complaint Left shoulder pain    HPI Alexandra [name] is a 45 y.o. female who presents with complaints of severe left-sided shoulder pain with pain traveling down her left arm as well. She reports it is worse with any movement, she has not been able to sleep because of pain. She also complains of some left anterior chest discomfort. She denies fevers or chills. No cough. No pleurisy. No history of blood clots. She does report some left sided posterior neck pain. No injury to the area.   Past Medical History:  Diagnosis Date  . Breast discharge 2013   present X 3 years  Left only multiple ducts per pt  . Depression     Patient Active Problem List   Diagnosis Date Noted  . 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 Tobacco use disorder 02/09/2016  . Neck muscle spasm 02/09/2016    Past Surgical History:  Procedure Laterality Date  . ABDOMINAL HYSTERECTOMY    . BREAST CYST EXCISION Left 2006    Prior to Admission medications   Medication Sig Start Date End Date Taking? Authorizing Provider  amoxicillin-clavulanate (AUGMENTIN) 875-125 MG tablet Take 1 tablet by mouth 2 (two) times daily. 02/09/16  Yes Reubin MilanLaura H Berglund, MD  citalopram (CELEXA) 40 MG tablet Take 40 mg by mouth daily.   Yes Historical Provider, MD  ibuprofen (ADVIL,MOTRIN) 800 MG tablet Take 1 tablet (800 mg total) by mouth every 8 (eight) hours as needed. 05/07/15  Yes Joni Reiningonald K Smith, PA-C  methocarbamol (ROBAXIN) 750 MG tablet Take 1 tablet (750 mg total) by mouth 4 (four) times daily. 02/09/16  Yes Reubin MilanLaura H Berglund, MD  traMADol (ULTRAM) 50 MG tablet Take 1 tablet (50 mg total) by mouth every 6 (six) hours as needed. 02/09/16  Yes Reubin MilanLaura H Berglund, MD      Allergies Patient has no known allergies.  Family History  Problem Relation Age of Onset  . Breast cancer Mother 2744    Living  . Breast cancer Maternal Aunt     Living  . Breast cancer Maternal Grandmother     Living  . Breast cancer Maternal Aunt     Maternal Great Aunt Deceased    Social History Social History  Substance Use Topics  . Smoking status: Current Every Day Smoker    Packs/day: 1.00    Years: 29.00    Types: Cigarettes  . Smokeless tobacco: Never Used  . Alcohol use 0.0 oz/week     Comment: social    Review of Systems  Constitutional: No fever/chills Eyes: No visual changes.  ENT: Neck pain as above Cardiovascular: As above Respiratory: Denies shortness of breath. Gastrointestinal: No abdominal pain. Mild nausea   Musculoskeletal: Left upper back pain Skin: Negative for rash. Neurological: Negative for headaches or weakness  10-point ROS otherwise negative.  ____________________________________________   PHYSICAL EXAM:  VITAL SIGNS: ED Triage Vitals  Enc Vitals Group     BP 02/16/16 1553 (!) 123/44     Pulse Rate 02/16/16 1553 82     Resp 02/16/16 1553 18     Temp 02/16/16 1553 98.1 F (36.7 C)     Temp Source 02/16/16 1553 Oral     SpO2 02/16/16 1553 99 %     Weight 02/16/16 1551  125 lb (56.7 kg)     Height 02/16/16 1551 5\' 3"  (1.6 m)     Head Circumference --      Peak Flow --      Pain Score 02/16/16 1552 10     Pain Loc --      Pain Edu? --      Excl. in GC? --     Constitutional: Alert and oriented. No acute distress.  Eyes: Conjunctivae are normal.  Head: Atraumatic. Nose: No congestion/rhinnorhea. Mouth/Throat: Mucous membranes are moist.   Neck: Worsening left shoulder pain with movement of the neck, no vertebral tenderness to palpation Cardiovascular: Normal rate, regular rhythm. Grossly normal heart sounds.  Good peripheral circulation. Respiratory: Normal respiratory effort.  No retractions. Lungs  CTAB. Gastrointestinal: Soft and nontender. No distention.  No CVA tenderness. Genitourinary: deferred Musculoskeletal: Muscle spasm and point tenderness left trapezius.  Externally is warm and well-perfused, normal strength in both upper And lower extremities, normal sensation throughout Neurologic:  Normal speech and language. No gross focal neurologic deficits are appreciated.  Skin:  Skin is warm, dry and intact. No rash noted. Psychiatric: Mood and affect are normal. Speech and behavior are normal.  ____________________________________________   LABS (all labs ordered are listed, but only abnormal results are displayed)  Labs Reviewed  CBC WITH DIFFERENTIAL/PLATELET - Abnormal; Notable for the following:       Result Value   Lymphs Abs 4.0 (*)    All other components within normal limits  COMPREHENSIVE METABOLIC PANEL - Abnormal; Notable for the following:    Chloride 100 (*)    Calcium 8.7 (*)    Total Bilirubin <0.1 (*)    All other components within normal limits  TROPONIN I  FIBRIN DERIVATIVES D-DIMER ([facility] ONLY)   ____________________________________________  EKG  ED ECG REPORT I, Jene EveryKINNER, Jenavie Stanczak, the attending physician, personally viewed and interpreted this ECG.  Date: 02/16/2016 EKG Time: 3:53 PM Rate: 77 Rhythm: normal sinus rhythm QRS Axis: normal Intervals: normal ST/T Wave abnormalities: normal Conduction Disturbances: none   ____________________________________________  RADIOLOGY  CT cervical spine demonstrates disc protrusion Chest x-ray unremarkable ____________________________________________   PROCEDURES  Procedure(s) performed: No    Critical Care performed: No ____________________________________________   INITIAL IMPRESSION / ASSESSMENT AND PLAN / ED COURSE  Pertinent labs & imaging results that were available during my care of the patient were reviewed by me and considered in my medical decision making (see chart for  details).  Patient initially treated with 4 mg IM of morphine and by mouth Zofran with mild improvement. However her pain returned significantly and she required IV Dilaudid. Suspect cervical radiculopathy, we'll admit for pain control and possible neurosurgical consultation   ______________________   FINAL CLINICAL IMPRESSION(S) / ED DIAGNOSES  Final diagnoses:  Intractable pain  Cervical radiculopathy      NEW MEDICATIONS STARTED DURING THIS VISIT:  New Prescriptions   No medications on file     Note:  This document was prepared using Dragon voice recognition software and may include unintentional dictation errors.    Jene Everyobert Rihanna Marseille, MD 02/16/16 2201

## 2016-02-16 NOTE — ED Notes (Signed)
Dr. Willis at bedside  

## 2016-02-16 NOTE — ED Triage Notes (Signed)
Reports left arm pain x1 wk, today pain in chest. Denies sob, some nausea.  Skin w/d with good color.

## 2016-02-16 NOTE — ED Notes (Signed)
Pt reports "numbness" to left arm, reports "shooting pain" down left arm, pt tearful in room. MD notified.

## 2016-02-16 NOTE — ED Notes (Signed)
Pt to CT via stretcher accomp by CT tech 

## 2016-02-16 NOTE — H&P (Signed)
[facility] [place] [facility]   PATIENT NAME: Alexandra [name]    MR#:  [id number]  DATE OF BIRTH:  [birth year]  DATE OF ADMISSION:  02/16/2016  PRIMARY CARE PHYSICIAN: No PCP Per Patient   REQUESTING/REFERRING PHYSICIAN: Cyril Loosen, MD  CHIEF COMPLAINT:   Chief Complaint  Patient presents with  . Chest Pain    HISTORY OF PRESENT ILLNESS:  Alexandra [name]  is a 45 y.o. female who presents with Progressive neck pain. Patient states that her neck pain radiates down her left arm, she also has some radiculopathy in the form of numbness down that arm. She reports significant muscular spasm. CT of her neck in the ED showed some bulging disc as well as disc protrusion. Her pain was uncontrolled after multiple rounds of pain medications, hospitalists were called for admission.  PAST MEDICAL HISTORY:   Past Medical History:  Diagnosis Date  . Breast discharge 2013   present X 3 years  Left only multiple ducts per pt  . Depression     PAST SURGICAL HISTORY:   Past Surgical History:  Procedure Laterality Date  . ABDOMINAL HYSTERECTOMY    . BREAST CYST EXCISION Left 2006    SOCIAL HISTORY:   Social History  Substance Use Topics  . Smoking status: Current Every Day Smoker    Packs/day: 1.00    Years: 29.00    Types: Cigarettes  . Smokeless tobacco: Never Used  . Alcohol use 0.0 oz/week     Comment: social    FAMILY HISTORY:   Family History  Problem Relation Age of Onset  . Breast cancer Mother 29    Living  . Breast cancer Maternal Aunt     Living  . Breast cancer Maternal Grandmother     Living  . Breast cancer Maternal Aunt     Maternal Great Aunt Deceased    DRUG ALLERGIES:  No Known Allergies  MEDICATIONS AT HOME:   Prior to Admission medications   Medication Sig Start Date End Date Taking? Authorizing Provider  amoxicillin-clavulanate (AUGMENTIN) 875-125 MG tablet Take 1 tablet by mouth 2 (two) times daily. 02/09/16  Yes Reubin Milan, MD  citalopram (CELEXA) 40 MG tablet Take 40 mg by mouth daily.   Yes Historical Provider, MD  ibuprofen (ADVIL,MOTRIN) 800 MG tablet Take 1 tablet (800 mg total) by mouth every 8 (eight) hours as needed. 05/07/15  Yes Joni Reining, PA-C  methocarbamol (ROBAXIN) 750 MG tablet Take 1 tablet (750 mg total) by mouth 4 (four) times daily. 02/09/16  Yes Reubin Milan, MD  traMADol (ULTRAM) 50 MG tablet Take 1 tablet (50 mg total) by mouth every 6 (six) hours as needed. 02/09/16  Yes Reubin Milan, MD    REVIEW OF SYSTEMS:  Review of Systems  Constitutional: Negative for chills, fever, malaise/fatigue and weight loss.  HENT: Negative for ear pain, hearing loss and tinnitus.   Eyes: Negative for blurred vision, double vision, pain and redness.  Respiratory: Negative for cough, hemoptysis and shortness of breath.   Cardiovascular: Negative for chest pain, palpitations, orthopnea and leg swelling.  Gastrointestinal: Negative for abdominal pain, constipation, diarrhea, nausea and vomiting.  Genitourinary: Negative for dysuria, frequency and hematuria.  Musculoskeletal: Positive for neck pain. Negative for back pain and joint pain.  Skin:       No acne, rash, or lesions  Neurological: Positive for sensory change. Negative for dizziness, tremors, focal weakness and weakness.  Endo/Heme/Allergies: Negative for polydipsia. Does  not bruise/bleed easily.  Psychiatric/Behavioral: Negative for depression. The patient is not nervous/anxious and does not have insomnia.      VITAL SIGNS:   Vitals:   02/16/16 1845 02/16/16 1920 02/16/16 2100 02/16/16 2130  BP:  129/84 129/90 119/81  Pulse: 87 80 76 82  Resp: 16 18 14 18   Temp:      TempSrc:      SpO2: 100% 100% 98% 99%  Weight:      Height:       Wt Readings from Last 3 Encounters:  02/16/16 56.7 kg (125 lb)  02/09/16 58.8 kg (129 lb 9.6 oz)  12/26/15 59 kg (130 lb)    PHYSICAL EXAMINATION:  Physical Exam  Vitals  reviewed. Constitutional: She is oriented to person, place, and time. She appears well-developed and well-nourished. No distress.  HENT:  Head: Normocephalic and atraumatic.  Mouth/Throat: Oropharynx is clear and moist.  Eyes: Conjunctivae and EOM are normal. Pupils are equal, round, and reactive to light. No scleral icterus.  Neck: Normal range of motion. Neck supple. No JVD present. No thyromegaly present.  Cardiovascular: Normal rate, regular rhythm and intact distal pulses.  Exam reveals no gallop and no friction rub.   No murmur heard. Respiratory: Effort normal and breath sounds normal. No respiratory distress. She has no wheezes. She has no rales.  GI: Soft. Bowel sounds are normal. She exhibits no distension. There is no tenderness.  Musculoskeletal: Normal range of motion. She exhibits tenderness (left neck). She exhibits no edema.  No arthritis, no gout  Lymphadenopathy:    She has no cervical adenopathy.  Neurological: She is alert and oriented to person, place, and time. No cranial nerve deficit.  No dysarthria, no aphasia  Skin: Skin is warm and dry. No rash noted. No erythema.  Psychiatric: She has a normal mood and affect. Her behavior is normal. Judgment and thought content normal.    LABORATORY PANEL:   CBC  Recent Labs Lab 02/16/16 1557  WBC 10.1  HGB 13.3  HCT 38.7  PLT 314   ------------------------------------------------------------------------------------------------------------------  Chemistries   Recent Labs Lab 02/16/16 1557  NA 135  K 3.8  CL 100*  CO2 27  GLUCOSE 96  BUN 11  CREATININE 0.50  CALCIUM 8.7*  AST 22  ALT 17  ALKPHOS 78  BILITOT <0.1*   ------------------------------------------------------------------------------------------------------------------  Cardiac Enzymes  Recent Labs Lab 02/16/16 1557  TROPONINI <0.03    ------------------------------------------------------------------------------------------------------------------  RADIOLOGY:  Ct Cervical Spine Wo Contrast  Result Date: 02/16/2016 CLINICAL DATA:  45 year old female with severe left-sided neck pain radiating down left arm for 1 week. No known injury. EXAM: CT CERVICAL SPINE WITHOUT CONTRAST TECHNIQUE: Multidetector CT imaging of the cervical spine was performed without intravenous contrast. Multiplanar CT image reconstructions were also generated. COMPARISON:  None. FINDINGS: Alignment: Normal. Skull base and vertebrae: No acute fracture. No primary bone lesion or focal pathologic process. Soft tissues and spinal canal: No prevertebral fluid or swelling. No visible canal hematoma. Disc levels: C2-3: Unremarkable C3-4:  Small central disc protrusion C4-5:  Minimal disc bulge which may contact the cord. C5-6: Moderate right spondylosis contributing to moderate central spinal and right bony foraminal narrowing. C6-7: A probable moderate right paracentral/foraminal disc protrusion is noted which causes foraminal narrowing and appears to contact and slightly flatten the cord. C7-T1:  No definite abnormality. Upper chest: Mild paraseptal emphysema. Other: None. IMPRESSION: Probable moderate right paracentral/foraminal disc protrusion which causes foraminal narrowing and appears to contact and slightly flatten the  cord. Moderate right spondylosis at C5-6 contributing to moderate central spinal and right bony foraminal narrowing. Minimal disc bulge at C4-5 which may contact the cord. No acute bony abnormality. Electronically Signed   By: Harmon PierJeffrey  Hu M.D.   On: 02/16/2016 19:56   Dg Chest Portable 1 View  Result Date: 02/16/2016 CLINICAL DATA:  Chest pain for 2 weeks. EXAM: PORTABLE CHEST 1 VIEW COMPARISON:  06/24/2015 and prior chest radiographs dating back to 09/29/2008 FINDINGS: The cardiomediastinal silhouette is unremarkable. Mild peribronchial thickening  is unchanged. There is no evidence of focal airspace disease, pulmonary edema, suspicious pulmonary nodule/mass, pleural effusion, or pneumothorax. No acute bony abnormalities are identified. IMPRESSION: No evidence of acute cardiopulmonary disease. Electronically Signed   By: Harmon PierJeffrey  Hu M.D.   On: 02/16/2016 18:46    EKG:   Orders placed or performed during the hospital encounter of 02/16/16  . EKG 12-Lead  . EKG 12-Lead    IMPRESSION AND PLAN:  Principal Problem:   Protrusion of cervical intervertebral disc - CT scan shows this C3-C4 and C6-C7. We will admit the patient with pain control, and get a neurosurgery consult. Active Problems:   Neck muscle spasm - when necessary muscle relaxer   Radiculopathy affecting upper extremity - likely due to disc protrusion as above, treatment as above. Patient has no weakness in that arm.   Neck pain - likely due to the muscle spasm and disc protrusion as above, treatment as above including when necessary analgesics   Depression - continue home meds  All the records are reviewed and case discussed with ED provider. Management plans discussed with the patient and/or family.  DVT PROPHYLAXIS: SubQ lovenox  GI PROPHYLAXIS: None  ADMISSION STATUS: Observation  CODE STATUS: Full Code Status History    This patient does not have a recorded code status. Please follow your organizational policy for patients in this situation.      TOTAL TIME TAKING CARE OF THIS PATIENT: 40 minutes.    Jabreel Chimento FIELDING 02/16/2016, 10:32 PM  [facility]  Office  [phone]  CC: Primary care physician; No PCP Per Patient

## 2016-02-16 NOTE — ED Notes (Signed)
Pt sitting up on side of stretcher, appears uncomfortable, grimacing; husband at bedside consoling patient; pt informed of plan of care and voices good understanding

## 2016-02-17 ENCOUNTER — Observation Stay: Payer: Self-pay

## 2016-02-17 LAB — CBC
HEMATOCRIT: 38.2 % (ref 35.0–47.0)
Hemoglobin: 13 g/dL (ref 12.0–16.0)
MCH: 29.9 pg (ref 26.0–34.0)
MCHC: 33.9 g/dL (ref 32.0–36.0)
MCV: 88.3 fL (ref 80.0–100.0)
PLATELETS: 311 10*3/uL (ref 150–440)
RBC: 4.33 MIL/uL (ref 3.80–5.20)
RDW: 14.8 % — AB (ref 11.5–14.5)
WBC: 8.9 10*3/uL (ref 3.6–11.0)

## 2016-02-17 LAB — BASIC METABOLIC PANEL
Anion gap: 6 (ref 5–15)
BUN: 9 mg/dL (ref 6–20)
CHLORIDE: 104 mmol/L (ref 101–111)
CO2: 28 mmol/L (ref 22–32)
CREATININE: 0.59 mg/dL (ref 0.44–1.00)
Calcium: 8.5 mg/dL — ABNORMAL LOW (ref 8.9–10.3)
GFR calc Af Amer: >60 mL/min (ref >60)
GFR calc non Af Amer: >60 mL/min (ref >60)
GLUCOSE: 92 mg/dL (ref 65–99)
POTASSIUM: 3.6 mmol/L (ref 3.5–5.1)
SODIUM: 138 mmol/L (ref 135–145)

## 2016-02-17 MED ORDER — DEXAMETHASONE SODIUM PHOSPHATE 4 MG/ML IJ SOLN
10.0000 mg | Freq: Once | INTRAMUSCULAR | Status: AC
  Administered 2016-02-17: 10 mg via INTRAVENOUS
  Filled 2016-02-17: qty 3

## 2016-02-17 MED ORDER — HYDROMORPHONE HCL 1 MG/ML IJ SOLN
1.0000 mg | Freq: Once | INTRAMUSCULAR | Status: AC
  Administered 2016-02-17: 1 mg via INTRAVENOUS
  Filled 2016-02-17: qty 1

## 2016-02-17 MED ORDER — OXYCODONE HCL 5 MG PO TABS
5.0000 mg | ORAL_TABLET | ORAL | Status: DC | PRN
  Administered 2016-02-17 – 2016-02-18 (×4): 5 mg via ORAL
  Filled 2016-02-17 (×4): qty 1

## 2016-02-17 MED ORDER — SENNOSIDES-DOCUSATE SODIUM 8.6-50 MG PO TABS
2.0000 | ORAL_TABLET | Freq: Two times a day (BID) | ORAL | Status: DC
  Administered 2016-02-17 – 2016-02-19 (×5): 2 via ORAL
  Filled 2016-02-17 (×5): qty 2

## 2016-02-17 MED ORDER — POLYETHYLENE GLYCOL 3350 17 G PO PACK
17.0000 g | PACK | Freq: Every day | ORAL | Status: DC
  Administered 2016-02-18 – 2016-02-19 (×2): 17 g via ORAL
  Filled 2016-02-17 (×2): qty 1

## 2016-02-17 MED ORDER — DIAZEPAM 2 MG PO TABS
2.0000 mg | ORAL_TABLET | Freq: Once | ORAL | Status: DC
  Filled 2016-02-17: qty 1

## 2016-02-17 MED ORDER — PREGABALIN 75 MG PO CAPS
75.0000 mg | ORAL_CAPSULE | Freq: Two times a day (BID) | ORAL | Status: DC
  Administered 2016-02-17 – 2016-02-19 (×5): 75 mg via ORAL
  Filled 2016-02-17 (×5): qty 1

## 2016-02-17 NOTE — Consult Note (Signed)
Referring Physician:  Keith RakeJohn C Jeramiah Mccaughey, MD [address] [place], [place] [address]  Primary Physician:  No primary care provider on file.  Chief Complaint:  Neck and left arm pain x 10 days  History of Present Illness: Alexandra [name] is a [age] y.o. female who presents with the chief complaint of acute left arm pain that has been constant and radiating in nature to her left upper extremity prompting ER evaluation and admission overnight. The pain is sharp and throbbing and extends to the dorsal aspect of her left hand and first 2 digits. She thinks her grip may be slightly weaker in that arm as well. The pain initially started under her left latera breast, then in the scapula region before extending down the left arm only. She denies any heavy lifting or falls prior to such pain. She also complains of numbness/tingling in same distribution. The pain is constant and this has not occurred in the past. No history of prior cervical spine surgery. She does use 1 pack per day of tobacco daily for many years.       Review of Systems:  A 10 point review of systems is negative, except for the pertinent positives and negatives detailed in the HPI.  Past Medical History: Past Medical History:  Diagnosis Date  . Breast discharge 2013   present X 3 years  Left only multiple ducts per pt  . Depression     Past Surgical History: Past Surgical History:  Procedure Laterality Date  . ABDOMINAL HYSTERECTOMY    . BREAST CYST EXCISION Left 2006    Allergies: Allergies as of 02/16/2016  . (No Known Allergies)    Medications: @ENCMED @  Social History: Social History  Substance Use Topics  . Smoking status: Current Every Day Smoker    Packs/day: 1.00    Years: 29.00    Types: Cigarettes  . Smokeless tobacco: Never Used  . Alcohol use 0.0 oz/week     Comment: social    Family Medical History: Family History  Problem Relation Age of Onset  . Breast cancer Mother [id number]    Living  .  Breast cancer Maternal Aunt     Living  . Breast cancer Maternal Grandmother     Living  . Breast cancer Maternal Aunt     Maternal Great Aunt Deceased    Physical Examination: @VITALWITHPAIN @  General: Patient is well developed, well nourished, calm, collected, and in no apparent distress.  Psychiatric: Patient is non-anxious.  Head:  Pupils equal, round, and reactive to light.  ENT:  Oral mucosa appears well hydrated.  Neck:   Supple.  Full range of motion.  Respiratory: Patient is breathing without any difficulty.  Extremities: No edema.  Vascular: Palpable pulses.  Skin:   On exposed skin, there are no abnormal skin lesions.  NEUROLOGICAL:  General: In no acute distress.   Awake, alert, oriented to person, place, and time.  Pupils equal round and reactive to light.  Facial tone is symmetric.  Tongue protrusion is midline.  There is no pronator drift.  ROM of spine: +Lhermitte  Palpation of spine: no TTP -spurling  Strength: Side Biceps Triceps Deltoid Interossei Grip Wrist Ext. Wrist Flex.  R 5 5 5 5 5 5 5   L 5- 5- 5 5 5- 5 5   Side Iliopsoas Quads Hamstring PF DF EHL  R 5 5 5 5 5 5   L 5 5 5 5 5 5    Reflexes are 2+ and symmetric  at the biceps, triceps, brachioradialis, patella and achilles.   Bilateral upper and lower extremity sensation is intact to light touch and pin prick.  Clonus is not present.  Toes are down-going.  Gait is normal.  No difficulty with tandem gait.  Hoffman's is absent.  Imaging: CT Cervical Spine reviewed - there is maintained cervical alignment. No evidence of fracture or subluxation noted. Disc osteophyte complex noted at C5/6 and C6/7; most notable with a likely left paracentral/foraminal disc protrusion.  MRI Cervical Spine: No evidence of cord signal change present; there is ventral compression most notable from C4/5 thru C6/7. The distal level has a left paracentral/foraminal disc protrusion with significant mass effect.      Assessment and Plan: Alexandra [name] is a pleasant 45 y.o. female with evidence of acute cervical radiculopathy and cervical stenosis from C4-7. I have reviewed the imaging with patient and family and have discussed options of treatment. For her acute symptoms, recommend a steroid taper and Lyrica for pain control which has been ordered. In regards to her symptoms and exam, I have discussed a C4-7 ACDF for direct decompression. I have discussed the risks and benefits of such, and of paramount importance to stop smoking to facilitate the fusion process as well as prevent further degeneration. Alternatives discussed include cervical physical therapy and arranging epidural steroid injections to see if this provides relief.  At this time, she will like to try the conservative measures, and will make arrangements for that as an outpatient. I will also provide a 4 week clinic follow up and if not improved, she will want to proceed with surgery at that time.   Thank you for involving me in the care of this patient. I will keep you apprised of the patient's progress. Please contact me with any questions   This note was partially dictated using voice recognition software, so please excuse any errors that were not corrected.    Alexandra StainJohn Cecila Satcher, MD

## 2016-02-17 NOTE — Care Management (Signed)
Application to [facility] given to boyfriend at bedside. Patient resting with her eyes closed.

## 2016-02-17 NOTE — Progress Notes (Signed)
Touched base with Dr Teola BradleyBarr (neurosurgery) Will get MRI Agreed with steroids Recommended lyrica He will see patient today but likely she can follow as outpatient

## 2016-02-17 NOTE — Progress Notes (Signed)
Pt refused mirilax. Pt states she has a bm once every 1 - 2 weeks. Educated pt on the importance of regular bm's and encouraged her to use the laxative due to the increase in pain medications.

## 2016-02-17 NOTE — Progress Notes (Signed)
[facility] [place] [facility]   PATIENT NAME: Alexandra LawmanWendy [name]    MRN#:  [id number]  DATE OF BIRTH:  Jun 10, 1970  SUBJECTIVE:  Hospital Day: 0 days Alexandra LawmanWendy [name] is a 45 y.o. female presenting with Neck pain.   Overnight events: No overnight events Interval Events: Continue complaints of neck pain radiation to left arm  REVIEW OF SYSTEMS:  CONSTITUTIONAL: No fever, fatigue or weakness.  EYES: No blurred or double vision.  EARS, NOSE, AND THROAT: No tinnitus or ear pain.  RESPIRATORY: No cough, shortness of breath, wheezing or hemoptysis.  CARDIOVASCULAR: No chest pain, orthopnea, edema.  GASTROINTESTINAL: No nausea, vomiting, diarrhea or abdominal pain.  GENITOURINARY: No dysuria, hematuria.  ENDOCRINE: No polyuria, nocturia,  HEMATOLOGY: No anemia, easy bruising or bleeding SKIN: No rash or lesion. MUSCULOSKELETAL: No joint pain or arthritis.   NEUROLOGIC: Positive tingling, numbness, denies weakness.  PSYCHIATRY: No anxiety or depression.   DRUG ALLERGIES:  No Known Allergies  VITALS:  Blood pressure (!) 106/46, pulse 97, temperature 97.9 F (36.6 C), temperature source Oral, resp. rate 18, height 5\' 3"  (1.6 m), weight 56.7 kg (125 lb), last menstrual period 01/14/2009, SpO2 92 %.  PHYSICAL EXAMINATION:  VITAL SIGNS: Vitals:   02/17/16 0349 02/17/16 0730  BP: 96/60 (!) 106/46  Pulse: 80 97  Resp: 16 18  Temp: 97.7 F (36.5 C) 97.9 F (36.6 C)   GENERAL:45 y.o.female currently in no acute distress.  HEAD: Normocephalic, atraumatic.  EYES: Pupils equal, round, reactive to light. Extraocular muscles intact. No scleral icterus.  MOUTH: Moist mucosal membrane. Dentition intact. No abscess noted.  EAR, NOSE, THROAT: Clear without exudates. No external lesions.  NECK: Supple. No thyromegaly. No nodules. No JVD.  PULMONARY: Clear to ascultation, without wheeze rails or rhonci. No use of accessory muscles, Good respiratory effort. good air  entry bilaterally CHEST: Nontender to palpation.  CARDIOVASCULAR: S1 and S2. Regular rate and rhythm. No murmurs, rubs, or gallops. No edema. Pedal pulses 2+ bilaterally.  GASTROINTESTINAL: Soft, nontender, nondistended. No masses. Positive bowel sounds. No hepatosplenomegaly.  MUSCULOSKELETAL: No swelling, clubbing, or edema. Range of motion full in all extremities.  NEUROLOGIC: Cranial nerves II through XII are intact. No gross focal neurological deficits. Strength 5/5 all extremities including hand grip Sensation intact. Reflexes intact.  SKIN: No ulceration, lesions, rashes, or cyanosis. Skin warm and dry. Turgor intact.  PSYCHIATRIC: Mood, affect within normal limits. The patient is awake, alert and oriented x 3. Insight, judgment intact.      LABORATORY PANEL:   CBC  Recent Labs Lab 02/17/16 0356  WBC 8.9  HGB 13.0  HCT 38.2  PLT 311   ------------------------------------------------------------------------------------------------------------------  Chemistries   Recent Labs Lab 02/16/16 1557 02/17/16 0356  NA 135 138  K 3.8 3.6  CL 100* 104  CO2 27 28  GLUCOSE 96 92  BUN 11 9  CREATININE 0.50 0.59  CALCIUM 8.7* 8.5*  AST 22  --   ALT 17  --   ALKPHOS 78  --   BILITOT <0.1*  --    ------------------------------------------------------------------------------------------------------------------  Cardiac Enzymes  Recent Labs Lab 02/16/16 1557  TROPONINI <0.03   ------------------------------------------------------------------------------------------------------------------  RADIOLOGY:  Ct Cervical Spine Wo Contrast  Result Date: 02/16/2016 CLINICAL DATA:  45 year old female with severe left-sided neck pain radiating down left arm for 1 week. No known injury. EXAM: CT CERVICAL SPINE WITHOUT CONTRAST TECHNIQUE: Multidetector CT imaging of the cervical spine was performed without intravenous contrast. Multiplanar CT image reconstructions were  also  generated. COMPARISON:  None. FINDINGS: Alignment: Normal. Skull base and vertebrae: No acute fracture. No primary bone lesion or focal pathologic process. Soft tissues and spinal canal: No prevertebral fluid or swelling. No visible canal hematoma. Disc levels: C2-3: Unremarkable C3-4:  Small central disc protrusion C4-5:  Minimal disc bulge which may contact the cord. C5-6: Moderate right spondylosis contributing to moderate central spinal and right bony foraminal narrowing. C6-7: A probable moderate right paracentral/foraminal disc protrusion is noted which causes foraminal narrowing and appears to contact and slightly flatten the cord. C7-T1:  No definite abnormality. Upper chest: Mild paraseptal emphysema. Other: None. IMPRESSION: Probable moderate right paracentral/foraminal disc protrusion which causes foraminal narrowing and appears to contact and slightly flatten the cord. Moderate right spondylosis at C5-6 contributing to moderate central spinal and right bony foraminal narrowing. Minimal disc bulge at C4-5 which may contact the cord. No acute bony abnormality. Electronically Signed   By: Harmon Pier M.D.   On: 02/16/2016 19:56   Mr Cervical Spine Wo Contrast  Result Date: 02/17/2016 CLINICAL DATA:  Progressive neck pain which radiates down the left arm. Left arm numbness. EXAM: MRI CERVICAL SPINE WITHOUT CONTRAST TECHNIQUE: Multiplanar, multisequence MR imaging of the cervical spine was performed. No intravenous contrast was administered. COMPARISON:  Cervical spine CT 02/16/2016 FINDINGS: Multiple sequences are mildly to moderately motion degraded. Alignment: Cervical spine straightening.  No listhesis. Vertebrae: Preserved vertebral body heights without evidence of fracture or focal osseous lesion. Cord: No cord signal abnormality identified within limitations of motion. Posterior Fossa, vertebral arteries, paraspinal tissues: Unremarkable. Disc levels: C2-3:  Minimal facet arthrosis without disc  herniation or stenosis. C3-4: Small central disc protrusion without stenosis or spinal cord mass effect C4-5: Mild disc bulging results in mild spinal stenosis with at most a minimal impression on the spinal cord. No significant neural foraminal stenosis. C5-6: Mild disc space narrowing. Broad-based disc osteophyte complex extending from the central to right foraminal zones results in moderate spinal stenosis and severe right neural foraminal stenosis. Mild to moderate right-sided cord flattening. C6-7: Mild disc space narrowing. Left paracentral disc protrusion and uncovertebral spurring result in moderate spinal stenosis with moderate stenosis at the entrance to the left neural foramen. There is mild to moderate cord flattening and potential left C7 nerve root impingement. C7-T1:  Negative. IMPRESSION: 1. C6-7 disc protrusion with moderate spinal stenosis and potential left C7 nerve root impingement. 2. C5-6 disc osteophyte complex with moderate spinal stenosis and severe right neural foraminal stenosis. Electronically Signed   By: Sebastian Ache M.D.   On: 02/17/2016 13:09   Dg Chest Portable 1 View  Result Date: 02/16/2016 CLINICAL DATA:  Chest pain for 2 weeks. EXAM: PORTABLE CHEST 1 VIEW COMPARISON:  06/24/2015 and prior chest radiographs dating back to 09/29/2008 FINDINGS: The cardiomediastinal silhouette is unremarkable. Mild peribronchial thickening is unchanged. There is no evidence of focal airspace disease, pulmonary edema, suspicious pulmonary nodule/mass, pleural effusion, or pneumothorax. No acute bony abnormalities are identified. IMPRESSION: No evidence of acute cardiopulmonary disease. Electronically Signed   By: Harmon Pier M.D.   On: 02/16/2016 18:46    EKG:   Orders placed or performed during the hospital encounter of 02/16/16  . EKG 12-Lead  . EKG 12-Lead    ASSESSMENT AND PLAN:   Aryaa Bunting is a 45 y.o. female presenting with Chest Pain . Admitted 02/16/2016 : Day #: 0  days  1. Intractable pain: Continue medications supportive measures at bowel regimen 2. Cervical disc protrusion:  Discussed case with neurosurgery will get MRI started steroids, Lyrica    All the records are reviewed and case discussed with Care Management/Social Workerr. Management plans discussed with the patient, family and they are in agreement.  CODE STATUS: full TOTAL TIME TAKING CARE OF THIS PATIENT: 33 minutes.   POSSIBLE D/C IN 1DAYS, DEPENDING ON CLINICAL CONDITION.   Clariece Roesler,  Mardi MainlandDavid K M.D on 02/17/2016 at 1:27 PM  Between 7am to 6pm - Pager - [phone]  After 6pm: House Pager: - [phone]  [facility]  Office  [phone]  CC: Primary care physician; No primary care provider on file.

## 2016-02-17 NOTE — Progress Notes (Signed)
New Admission Note:  (late entry)  Arrival Method: per stretcher from ED, pt came from home Mental Orientation: alert and oriented X4 Telemetry: none ordered Assessment: Completed Skin: warm, dry, intact, no wounds noted, tattoos on both arms and back IV: G20 on the right AC with transparent dressing, intact, flushed Pain: 10/10 scale on the left arm, pt received pain medicine at ED, will administer due PRN pain medicine Safety Measures: Safety Fall Prevention Plan has been discussed with pt and significant other Admission: Completed 1A Orientation: Patient has been oriented to the room, unit and staff.  Family: spouse at bedside  Orders have been reviewed and implemented. Will continue to monitor the patient. Call light has been placed within reach.  Janice NorrieAnessa Gabrial Domine BSN, RN [facility] 1A

## 2016-02-18 MED ORDER — METHYLPREDNISOLONE 4 MG PO TBPK
4.0000 mg | ORAL_TABLET | ORAL | Status: AC
  Administered 2016-02-18: 4 mg via ORAL

## 2016-02-18 MED ORDER — METHYLPREDNISOLONE 4 MG PO TBPK
8.0000 mg | ORAL_TABLET | Freq: Every morning | ORAL | Status: AC
  Administered 2016-02-18: 8 mg via ORAL
  Filled 2016-02-18: qty 21

## 2016-02-18 MED ORDER — OXYCODONE-ACETAMINOPHEN 5-325 MG PO TABS
2.0000 | ORAL_TABLET | ORAL | Status: DC | PRN
  Administered 2016-02-18 – 2016-02-19 (×5): 2 via ORAL
  Filled 2016-02-18 (×6): qty 2

## 2016-02-18 MED ORDER — METHOCARBAMOL 1000 MG/10ML IJ SOLN
500.0000 mg | Freq: Four times a day (QID) | INTRAVENOUS | Status: DC | PRN
  Administered 2016-02-18: 500 mg via INTRAVENOUS
  Filled 2016-02-18 (×3): qty 5

## 2016-02-18 MED ORDER — METHYLPREDNISOLONE 4 MG PO TBPK
4.0000 mg | ORAL_TABLET | Freq: Three times a day (TID) | ORAL | Status: DC
  Administered 2016-02-19: 4 mg via ORAL

## 2016-02-18 MED ORDER — METHYLPREDNISOLONE 4 MG PO TBPK: 4.0000 mg | ORAL_TABLET | Freq: Four times a day (QID) | ORAL | Status: DC

## 2016-02-18 MED ORDER — METHYLPREDNISOLONE 4 MG PO TBPK
8.0000 mg | ORAL_TABLET | Freq: Every evening | ORAL | Status: AC
  Administered 2016-02-18: 8 mg via ORAL

## 2016-02-18 MED ORDER — METHYLPREDNISOLONE 4 MG PO TBPK: 8.0000 mg | ORAL_TABLET | Freq: Every evening | ORAL | Status: DC

## 2016-02-18 NOTE — Progress Notes (Signed)
Pt Crying this morning. States "the oxy does not work at all for my pain" Administered 1 mg dilaudid IV. Informed pt I would discuss alternative medications with MD this am. Cont to monitor for pain control .

## 2016-02-18 NOTE — Progress Notes (Signed)
Pt received percocet at 1800 for pain rated 2/10. Pt has received flexeril earlier, per husband at bedside, pt now feels the most relief she has had since admission. This Clinical research associatewriter reviewed with pt when next medications were due.

## 2016-02-18 NOTE — Progress Notes (Signed)
[facility] - [place] at [facility]   PATIENT NAME: Alexandra [name]    MRN#:  [id number]  DATE OF BIRTH:  07-22-1970  SUBJECTIVE:  Hospital Day: 0 days Alexandra [name] is a 45 y.o. female presenting with Neck pain.   Overnight events: No overnight events Interval Events: Continue complaints of neck pain radiation to left arm  REVIEW OF SYSTEMS:  CONSTITUTIONAL: No fever, fatigue or weakness.  EYES: No blurred or double vision.  EARS, NOSE, AND THROAT: No tinnitus or ear pain.  RESPIRATORY: No cough, shortness of breath, wheezing or hemoptysis.  CARDIOVASCULAR: No chest pain, orthopnea, edema.  GASTROINTESTINAL: No nausea, vomiting, diarrhea or abdominal pain.  GENITOURINARY: No dysuria, hematuria.  ENDOCRINE: No polyuria, nocturia,  HEMATOLOGY: No anemia, easy bruising or bleeding SKIN: No rash or lesion. MUSCULOSKELETAL: No joint pain or arthritis.   NEUROLOGIC: Positive tingling, numbness, denies weakness.  PSYCHIATRY: No anxiety or depression.   DRUG ALLERGIES:  No Known Allergies  VITALS:  Blood pressure 121/64, pulse 85, temperature 98.6 F (37 C), temperature source Oral, resp. rate 15, height 5\' 3"  (1.6 m), weight 56.7 kg (125 lb), last menstrual period 01/14/2009, SpO2 95 %.  PHYSICAL EXAMINATION:  VITAL SIGNS: Vitals:   02/18/16 0410 02/18/16 0807  BP: (!) 98/59 121/64  Pulse: 96 85  Resp: 18 15  Temp: 98.4 F (36.9 C) 98.6 F (37 C)   GENERAL:45 y.o.female currently in no acute distress.  HEAD: Normocephalic, atraumatic.  EYES: Pupils equal, round, reactive to light. Extraocular muscles intact. No scleral icterus.  MOUTH: Moist mucosal membrane. Dentition intact. No abscess noted.  EAR, NOSE, THROAT: Clear without exudates. No external lesions.  NECK: Supple. No thyromegaly. No nodules. No JVD.  PULMONARY: Clear to ascultation, without wheeze rails or rhonci. No use of accessory muscles, Good respiratory effort. good air entry  bilaterally CHEST: Nontender to palpation.  CARDIOVASCULAR: S1 and S2. Regular rate and rhythm. No murmurs, rubs, or gallops. No edema. Pedal pulses 2+ bilaterally.  GASTROINTESTINAL: Soft, nontender, nondistended. No masses. Positive bowel sounds. No hepatosplenomegaly.  MUSCULOSKELETAL: No swelling, clubbing, or edema. Range of motion full in all extremities.  NEUROLOGIC: Cranial nerves II through XII are intact. No gross focal neurological deficits. Strength 5/5 all extremities including hand grip Sensation intact. Reflexes intact.  SKIN: No ulceration, lesions, rashes, or cyanosis. Skin warm and dry. Turgor intact.  PSYCHIATRIC: Mood, affect within normal limits. The patient is awake, alert and oriented x 3. Insight, judgment intact.      LABORATORY PANEL:   CBC  Recent Labs Lab 02/17/16 0356  WBC 8.9  HGB 13.0  HCT 38.2  PLT 311   ------------------------------------------------------------------------------------------------------------------  Chemistries   Recent Labs Lab 02/16/16 1557 02/17/16 0356  NA 135 138  K 3.8 3.6  CL 100* 104  CO2 27 28  GLUCOSE 96 92  BUN 11 9  CREATININE 0.50 0.59  CALCIUM 8.7* 8.5*  AST 22  --   ALT 17  --   ALKPHOS 78  --   BILITOT <0.1*  --    ------------------------------------------------------------------------------------------------------------------  Cardiac Enzymes  Recent Labs Lab 02/16/16 1557  TROPONINI <0.03   ------------------------------------------------------------------------------------------------------------------  RADIOLOGY:  Ct Cervical Spine Wo Contrast  Result Date: 02/16/2016 CLINICAL DATA:  45 year old female with severe left-sided neck pain radiating down left arm for 1 week. No known injury. EXAM: CT CERVICAL SPINE WITHOUT CONTRAST TECHNIQUE: Multidetector CT imaging of the cervical spine was performed without intravenous contrast. Multiplanar CT image reconstructions were also  generated.  COMPARISON:  None. FINDINGS: Alignment: Normal. Skull base and vertebrae: No acute fracture. No primary bone lesion or focal pathologic process. Soft tissues and spinal canal: No prevertebral fluid or swelling. No visible canal hematoma. Disc levels: C2-3: Unremarkable C3-4:  Small central disc protrusion C4-5:  Minimal disc bulge which may contact the cord. C5-6: Moderate right spondylosis contributing to moderate central spinal and right bony foraminal narrowing. C6-7: A probable moderate right paracentral/foraminal disc protrusion is noted which causes foraminal narrowing and appears to contact and slightly flatten the cord. C7-T1:  No definite abnormality. Upper chest: Mild paraseptal emphysema. Other: None. IMPRESSION: Probable moderate right paracentral/foraminal disc protrusion which causes foraminal narrowing and appears to contact and slightly flatten the cord. Moderate right spondylosis at C5-6 contributing to moderate central spinal and right bony foraminal narrowing. Minimal disc bulge at C4-5 which may contact the cord. No acute bony abnormality. Electronically Signed   By: Harmon PierJeffrey  Hu M.D.   On: 02/16/2016 19:56   Mr Cervical Spine Wo Contrast  Result Date: 02/17/2016 CLINICAL DATA:  Progressive neck pain which radiates down the left arm. Left arm numbness. EXAM: MRI CERVICAL SPINE WITHOUT CONTRAST TECHNIQUE: Multiplanar, multisequence MR imaging of the cervical spine was performed. No intravenous contrast was administered. COMPARISON:  Cervical spine CT 02/16/2016 FINDINGS: Multiple sequences are mildly to moderately motion degraded. Alignment: Cervical spine straightening.  No listhesis. Vertebrae: Preserved vertebral body heights without evidence of fracture or focal osseous lesion. Cord: No cord signal abnormality identified within limitations of motion. Posterior Fossa, vertebral arteries, paraspinal tissues: Unremarkable. Disc levels: C2-3:  Minimal facet arthrosis without disc herniation or  stenosis. C3-4: Small central disc protrusion without stenosis or spinal cord mass effect C4-5: Mild disc bulging results in mild spinal stenosis with at most a minimal impression on the spinal cord. No significant neural foraminal stenosis. C5-6: Mild disc space narrowing. Broad-based disc osteophyte complex extending from the central to right foraminal zones results in moderate spinal stenosis and severe right neural foraminal stenosis. Mild to moderate right-sided cord flattening. C6-7: Mild disc space narrowing. Left paracentral disc protrusion and uncovertebral spurring result in moderate spinal stenosis with moderate stenosis at the entrance to the left neural foramen. There is mild to moderate cord flattening and potential left C7 nerve root impingement. C7-T1:  Negative. IMPRESSION: 1. C6-7 disc protrusion with moderate spinal stenosis and potential left C7 nerve root impingement. 2. C5-6 disc osteophyte complex with moderate spinal stenosis and severe right neural foraminal stenosis. Electronically Signed   By: Sebastian AcheAllen  Grady M.D.   On: 02/17/2016 13:09   Dg Chest Portable 1 View  Result Date: 02/16/2016 CLINICAL DATA:  Chest pain for 2 weeks. EXAM: PORTABLE CHEST 1 VIEW COMPARISON:  06/24/2015 and prior chest radiographs dating back to 09/29/2008 FINDINGS: The cardiomediastinal silhouette is unremarkable. Mild peribronchial thickening is unchanged. There is no evidence of focal airspace disease, pulmonary edema, suspicious pulmonary nodule/mass, pleural effusion, or pneumothorax. No acute bony abnormalities are identified. IMPRESSION: No evidence of acute cardiopulmonary disease. Electronically Signed   By: Harmon PierJeffrey  Hu M.D.   On: 02/16/2016 18:46    EKG:   Orders placed or performed during the hospital encounter of 02/16/16  . EKG 12-Lead  . EKG 12-Lead    ASSESSMENT AND PLAN:   Wynonia LawmanWendy Bursch is a 45 y.o. female presenting with Chest Pain . Admitted 02/16/2016 : Day #: 0 days  1.  Intractable pain: Continue medications supportive measures at bowel regimen 2. Cervical disc protrusion: Discussed  case with neurosurgery  Patient still has some complaints of pain titrate pain medications slightly, start steroid Dosepak plan on discharge tomorrow    All the records are reviewed and case discussed with Care Management/Social Workerr. Management plans discussed with the patient, family and they are in agreement.  CODE STATUS: full TOTAL TIME TAKING CARE OF THIS PATIENT: 33 minutes.   POSSIBLE D/C IN 1DAYS, DEPENDING ON CLINICAL CONDITION.   Ruford Dudzinski,  Mardi MainlandDavid K M.D on 02/18/2016 at 2:34 PM  Between 7am to 6pm - Pager - [phone]  After 6pm: House Pager: - [phone]  [facility]  Office  [phone]  CC: Primary care physician; No primary care provider on file.

## 2016-02-19 MED ORDER — OXYCODONE HCL 5 MG PO TABS
10.0000 mg | ORAL_TABLET | Freq: Once | ORAL | Status: AC
  Administered 2016-02-19: 10 mg via ORAL
  Filled 2016-02-19: qty 2

## 2016-02-19 MED ORDER — PREGABALIN 75 MG PO CAPS: 75.0000 mg | ORAL_CAPSULE | Freq: Two times a day (BID) | ORAL | 0 refills | Status: DC

## 2016-02-19 MED ORDER — METHOCARBAMOL 750 MG PO TABS: 750.0000 mg | ORAL_TABLET | Freq: Four times a day (QID) | ORAL | 0 refills | Status: DC

## 2016-02-19 MED ORDER — OXYCODONE HCL 10 MG PO TABS: 10.0000 mg | ORAL_TABLET | Freq: Once | ORAL | 0 refills | Status: DC

## 2016-02-19 MED ORDER — OXYCODONE-ACETAMINOPHEN 5-325 MG PO TABS: 2.0000 | ORAL_TABLET | ORAL | 0 refills | Status: DC | PRN

## 2016-02-19 MED ORDER — OXYCODONE HCL 10 MG PO TABS: 10.0000 mg | ORAL_TABLET | Freq: Once | ORAL | 0 refills | Status: AC

## 2016-02-19 MED ORDER — MAGNESIUM CITRATE PO SOLN
1.0000 | Freq: Once | ORAL | Status: DC
  Filled 2016-02-19: qty 296

## 2016-02-19 MED ORDER — SENNOSIDES-DOCUSATE SODIUM 8.6-50 MG PO TABS
2.0000 | ORAL_TABLET | Freq: Two times a day (BID) | ORAL | 0 refills | Status: DC
Start: 2016-02-19 — End: 2016-03-16

## 2016-02-19 MED ORDER — CYCLOBENZAPRINE HCL 10 MG PO TABS: 10.0000 mg | ORAL_TABLET | Freq: Three times a day (TID) | ORAL | 0 refills | Status: DC | PRN

## 2016-02-19 MED ORDER — METHYLPREDNISOLONE 4 MG PO TBPK: ORAL_TABLET | ORAL | 0 refills | Status: DC

## 2016-02-19 NOTE — Progress Notes (Signed)
   [place] [facility] AT [facility] [address] [place], [place] [address]  February 19, 2016  Patient:  Alexandra LawmanWendy [name] Date of Birth: October 30, 1970 Date of Visit:  02/16/2016  To Whom it May Concern:  Please excuse Alexandra CreaseWendy Alexandra [name] from work from 02/16/2016 until 02/19/16 as she was admitted to the [facility] for medical treatment and has been receiving appropriate care. She may return to work on 02/21/16, sooner if she feels she is able to return sooner than this date.      Please don't hesitate to contact me with questions or concerns by calling  [phone] and asking them to page me directly.   Marge Duncansave [name], MD

## 2016-02-19 NOTE — Discharge Summary (Addendum)
[facility] - [place] at [facility]   PATIENT NAME: Alexandra LawmanWendy [name]    MR#:  [id number]  DATE OF BIRTH:  1970-10-13  DATE OF ADMISSION:  02/16/2016 ADMITTING PHYSICIAN: Oralia Manisavid Willis, MD  DATE OF DISCHARGE: 02/19/16  PRIMARY CARE PHYSICIAN: No primary care provider on file.    ADMISSION DIAGNOSIS:  Cervical radiculopathy [M54.12] Intractable pain [R52]  DISCHARGE DIAGNOSIS:  Principal Problem:   Protrusion of cervical intervertebral disc Active Problems:   Radiculopathy affecting upper extremity   Neck pain   Depression   SECONDARY DIAGNOSIS:   Past Medical History:  Diagnosis Date  . Breast discharge 2013   present X 3 years  Left only multiple ducts per pt  . Depression     HOSPITAL COURSE:  Alexandra [name]  is a 45 y.o. female admitted 02/16/2016 with chief complaint left arm pain. Please see H&P performed by Oralia Manisavid Willis, MD for further information. Patient presented with above symptoms in addition to neck pain  Imaging revealed IMPRESSION: 1. C6-7 disc protrusion with moderate spinal stenosis and potential left C7 nerve root impingement. 2. C5-6 disc osteophyte complex with moderate spinal stenosis and severe right neural foraminal stenosis.  Patient evaluated by neurosurgery recommending conservative management with Lyrica steroids pain medication will follow up as outpatient  DISCHARGE CONDITIONS:   Stable  CONSULTS OBTAINED:  Treatment Team:  Keith RakeJohn C Barr, MD  DRUG ALLERGIES:  No Known Allergies  DISCHARGE MEDICATIONS:   Current Discharge Medication List    START taking these medications   Details  cyclobenzaprine (FLEXERIL) 10 MG tablet Take 1 tablet (10 mg total) by mouth 3 (three) times daily as needed for muscle spasms. Qty: 30 tablet, Refills: 0    methylPREDNISolone (MEDROL DOSEPAK) 4 MG TBPK tablet 4 tablets x2 day, 3 tablets x2 day, 2 tablets x2 days, 1 tablet daily until gone Qty: 21 tablet, Refills: 0    oxyCODONE 10  MG TABS Take 1 tablet (10 mg total) by mouth once. Qty: 30 tablet, Refills: 0    pregabalin (LYRICA) 75 MG capsule Take 1 capsule (75 mg total) by mouth 2 (two) times daily. Qty: 60 capsule, Refills: 0    senna-docusate (SENOKOT-S) 8.6-50 MG tablet Take 2 tablets by mouth 2 (two) times daily. Qty: 120 tablet, Refills: 0      CONTINUE these medications which have CHANGED   Details  methocarbamol (ROBAXIN) 750 MG tablet Take 1 tablet (750 mg total) by mouth 4 (four) times daily. Qty: 60 tablet, Refills: 0   Associated Diagnoses: Neck muscle spasm      CONTINUE these medications which have NOT CHANGED   Details  citalopram (CELEXA) 40 MG tablet Take 40 mg by mouth daily.    ibuprofen (ADVIL,MOTRIN) 800 MG tablet Take 1 tablet (800 mg total) by mouth every 8 (eight) hours as needed. Qty: 30 tablet, Refills: 0    traMADol (ULTRAM) 50 MG tablet Take 1 tablet (50 mg total) by mouth every 6 (six) hours as needed. Qty: 40 tablet, Refills: 0   Associated Diagnoses: Neck muscle spasm      STOP taking these medications     amoxicillin-clavulanate (AUGMENTIN) 875-125 MG tablet          DISCHARGE INSTRUCTIONS:    DIET:  Regular diet  DISCHARGE CONDITION:  Stable  ACTIVITY:  Activity as tolerated  OXYGEN:  Home Oxygen: No.   Oxygen Delivery: room air  DISCHARGE LOCATION:  home   If you experience worsening of your admission symptoms,  develop shortness of breath, life threatening emergency, suicidal or homicidal thoughts you must seek medical attention immediately by calling 911 or calling your MD immediately  if symptoms less severe.  You Must read complete instructions/literature along with all the possible adverse reactions/side effects for all the Medicines you take and that have been prescribed to you. Take any new Medicines after you have completely understood and accpet all the possible adverse reactions/side effects.   Please note  You were cared for by a  hospitalist during your hospital stay. If you have any questions about your discharge medications or the care you received while you were in the hospital after you are discharged, you can call the unit and asked to speak with the hospitalist on call if the hospitalist that took care of you is not available. Once you are discharged, your primary care physician will handle any further medical issues. Please note that NO REFILLS for any discharge medications will be authorized once you are discharged, as it is imperative that you return to your primary care physician (or establish a relationship with a primary care physician if you do not have one) for your aftercare needs so that they can reassess your need for medications and monitor your lab values.    On the day of Discharge:   VITAL SIGNS:  Blood pressure 103/60, pulse 75, temperature 98.1 F (36.7 C), temperature source Oral, resp. rate 18, height 5\' 3"  (1.6 m), weight 56.7 kg (125 lb), last menstrual period 01/14/2009, SpO2 95 %.  I/O:   Intake/Output Summary (Last 24 hours) at 02/19/16 0835 Last data filed at 02/19/16 0606  Gross per 24 hour  Intake              535 ml  Output                0 ml  Net              535 ml    PHYSICAL EXAMINATION:  GENERAL:  45 y.o.-year-old patient lying in the bed with no acute distress.  EYES: Pupils equal, round, reactive to light and accommodation. No scleral icterus. Extraocular muscles intact.  HEENT: Head atraumatic, normocephalic. Oropharynx and nasopharynx clear.  NECK:  Supple, no jugular venous distention. No thyroid enlargement, no tenderness.  LUNGS: Normal breath sounds bilaterally, no wheezing, rales,rhonchi or crepitation. No use of accessory muscles of respiration.  CARDIOVASCULAR: S1, S2 normal. No murmurs, rubs, or gallops.  ABDOMEN: Soft, non-tender, non-distended. Bowel sounds present. No organomegaly or mass.  EXTREMITIES: No pedal edema, cyanosis, or clubbing.  NEUROLOGIC:  Cranial nerves II through XII are intact. Muscle strength 5/5 in all extremities. Sensation intact. Gait not checked.  PSYCHIATRIC: The patient is alert and oriented x 3.  SKIN: No obvious rash, lesion, or ulcer.   DATA REVIEW:   CBC  Recent Labs Lab 02/17/16 0356  WBC 8.9  HGB 13.0  HCT 38.2  PLT 311    Chemistries   Recent Labs Lab 02/16/16 1557 02/17/16 0356  NA 135 138  K 3.8 3.6  CL 100* 104  CO2 27 28  GLUCOSE 96 92  BUN 11 9  CREATININE 0.50 0.59  CALCIUM 8.7* 8.5*  AST 22  --   ALT 17  --   ALKPHOS 78  --   BILITOT <0.1*  --     Cardiac Enzymes  Recent Labs Lab 02/16/16 1557  TROPONINI <0.03    Microbiology Results  Results for orders placed or  performed during the hospital encounter of 05/07/15  Rapid Influenza A&B Antigens ([facility] only)     Status: None   Collection Time: 05/07/15  1:42 PM  Result Value Ref Range Status   Influenza A [facility]) NOT DETECTED  Final   Influenza B [facility]) NOT DETECTED  Final    RADIOLOGY:  Mr Cervical Spine Wo Contrast  Result Date: 02/17/2016 CLINICAL DATA:  Progressive neck pain which radiates down the left arm. Left arm numbness. EXAM: MRI CERVICAL SPINE WITHOUT CONTRAST TECHNIQUE: Multiplanar, multisequence MR imaging of the cervical spine was performed. No intravenous contrast was administered. COMPARISON:  Cervical spine CT 02/16/2016 FINDINGS: Multiple sequences are mildly to moderately motion degraded. Alignment: Cervical spine straightening.  No listhesis. Vertebrae: Preserved vertebral body heights without evidence of fracture or focal osseous lesion. Cord: No cord signal abnormality identified within limitations of motion. Posterior Fossa, vertebral arteries, paraspinal tissues: Unremarkable. Disc levels: C2-3:  Minimal facet arthrosis without disc herniation or stenosis. C3-4: Small central disc protrusion without stenosis or spinal cord mass effect C4-5: Mild disc bulging results in mild spinal stenosis with at  most a minimal impression on the spinal cord. No significant neural foraminal stenosis. C5-6: Mild disc space narrowing. Broad-based disc osteophyte complex extending from the central to right foraminal zones results in moderate spinal stenosis and severe right neural foraminal stenosis. Mild to moderate right-sided cord flattening. C6-7: Mild disc space narrowing. Left paracentral disc protrusion and uncovertebral spurring result in moderate spinal stenosis with moderate stenosis at the entrance to the left neural foramen. There is mild to moderate cord flattening and potential left C7 nerve root impingement. C7-T1:  Negative. IMPRESSION: 1. C6-7 disc protrusion with moderate spinal stenosis and potential left C7 nerve root impingement. 2. C5-6 disc osteophyte complex with moderate spinal stenosis and severe right neural foraminal stenosis. Electronically Signed   By: Sebastian AcheAllen  Grady M.D.   On: 02/17/2016 13:09     Management plans discussed with the patient, family and they are in agreement.  CODE STATUS:     Code Status Orders        Start     Ordered   02/16/16 2323  Full code  Continuous     02/16/16 2322    Code Status History    Date Active Date Inactive Code Status Order ID Comments User Context   This patient has a current code status but no historical code status.      TOTAL TIME TAKING CARE OF THIS PATIENT: 33 minutes.    Hower,  Mardi MainlandDavid K M.D on 02/19/2016 at 8:35 AM  Between 7am to 6pm - Pager - [phone]  After 6pm go to [email or web address] - Scientist, research (life sciences)password [email or web address]  [facility] [place] [facility]  Office  [phone]  CC: Primary care physician; No primary care provider on file.

## 2016-02-19 NOTE — Progress Notes (Signed)
Discharge instructions and prescriptions given.  Reinforcement given about [facility] by care management. IV's removed per policy.

## 2016-02-19 NOTE — Care Management (Signed)
Spoke with fiance and patient again regarding [facility] and [facility] application. They have submitted the application to [facility] and are to get her medications fills today. Referral sent to Lelon [facility]astLorrie Holt [facility]. Encour[facility] patient to [facility] well to schedule an [facility]. [facility] provided my contact number for any further questions.

## 2016-02-19 NOTE — Progress Notes (Signed)
Patient refused wheelchair for discharge.  Patient ambulated out in company of fiance to be taken home via personal vehicle by fiance.

## 2016-03-01 ENCOUNTER — Other Ambulatory Visit: Payer: Self-pay | Admitting: Internal Medicine

## 2016-03-01 DIAGNOSIS — J40 Bronchitis, not specified as acute or chronic: Secondary | ICD-10-CM

## 2016-03-01 MED ORDER — AMOXICILLIN-POT CLAVULANATE 875-125 MG PO TABS: 1.0000 | ORAL_TABLET | Freq: Two times a day (BID) | ORAL | 0 refills | Status: DC

## 2016-03-10 ENCOUNTER — Telehealth: Payer: Self-pay | Admitting: *Deleted

## 2016-03-10 NOTE — Telephone Encounter (Signed)
Pt called states she is still not any better, patient is asking what she needs to do?

## 2016-03-10 NOTE — Telephone Encounter (Signed)
Called pt back to let her know the coughing and any drainage is normal after finishing all the antibiotic and should last 1-2 weeks but getting better. If symptoms getting worse please call the office back.

## 2016-03-12 ENCOUNTER — Ambulatory Visit: Payer: Self-pay | Admitting: Pharmacy Technician

## 2016-03-12 NOTE — Progress Notes (Signed)
Patient scheduled for eligibility appointment at [facility].  Patient did not show for the appointment on 03/12/16 at 10:30a.m.  Patient did not reschedule eligibility appointment.  Sherilyn DacostaBetty J. Trinitie Mcgirr Care Manager [facility]

## 2016-03-16 ENCOUNTER — Encounter: Payer: Self-pay | Admitting: Internal Medicine

## 2016-03-16 ENCOUNTER — Ambulatory Visit (INDEPENDENT_AMBULATORY_CARE_PROVIDER_SITE_OTHER): Payer: Self-pay | Admitting: Internal Medicine

## 2016-03-16 ENCOUNTER — Ambulatory Visit
Admission: RE | Admit: 2016-03-16 | Discharge: 2016-03-16 | Disposition: A | Discharge status: Home / Self Care | Payer: Self-pay | Source: Ambulatory Visit | Attending: Internal Medicine | Admitting: Internal Medicine

## 2016-03-16 VITALS — BP 116/78 | HR 87 | Temp 97.4°F | Ht 62.0 in | Wt 134.0 lb

## 2016-03-16 DIAGNOSIS — J40 Bronchitis, not specified as acute or chronic: Secondary | ICD-10-CM | POA: Insufficient documentation

## 2016-03-16 MED ORDER — LEVOFLOXACIN 500 MG PO TABS: 500.0000 mg | ORAL_TABLET | Freq: Every day | ORAL | 0 refills | Status: DC

## 2016-03-16 MED ORDER — PREDNISONE 10 MG PO TABS
ORAL_TABLET | ORAL | 0 refills | Status: DC
Start: 2016-03-16 — End: 2016-03-24

## 2016-03-16 NOTE — Progress Notes (Signed)
Date:  03/16/2016   Name:  Alexandra CreaseWendy G Flaming   DOB:  June 24, 1970   MRN:  [id number]   Chief Complaint: No chief complaint on file. Cough  This is a recurrent problem. The current episode started in the past 7 days. The problem has been gradually worsening. The problem occurs every few minutes. The cough is non-productive (loose). Associated symptoms include chest pain, postnasal drip, shortness of breath and wheezing. Pertinent negatives include no chills or fever. The symptoms are aggravated by exercise, lying down and cold air. Risk factors for lung disease include smoking/tobacco exposure. She has tried nothing for the symptoms.    Review of Systems  Constitutional: Positive for diaphoresis and fatigue. Negative for chills and fever.  HENT: Positive for postnasal drip.   Respiratory: Positive for cough, chest tightness, shortness of breath and wheezing.   Cardiovascular: Positive for chest pain and palpitations.  Gastrointestinal: Negative for abdominal pain, nausea and vomiting.    Patient Active Problem List   Diagnosis Date Noted  . Radiculopathy affecting upper extremity 02/16/2016  . Neck pain 02/16/2016  . Protrusion of cervical intervertebral disc 02/16/2016  . Depression 02/16/2016  . 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 Tobacco use disorder 02/09/2016    Prior to Admission medications   Medication Sig Start Date End Date Taking? Authorizing Provider  citalopram (CELEXA) 40 MG tablet Take 40 mg by mouth daily.    Historical Provider, MD    No Known Allergies  Past Surgical History:  Procedure Laterality Date  . ABDOMINAL HYSTERECTOMY    . BREAST CYST EXCISION Left 2006    Social History  Substance Use Topics  . Smoking status: Current Every Day Smoker    Packs/day: 1.00    Years: 29.00    Types: Cigarettes  . Smokeless tobacco: Never Used  . Alcohol use 0.0 oz/week     Comment: social     Medication list  has been reviewed and updated.   Physical Exam  Constitutional: She is oriented to person, place, and time. She appears well-developed. She appears ill. No distress.  HENT:  Head: Normocephalic and atraumatic.  Right Ear: Tympanic membrane and ear canal normal.  Left Ear: Tympanic membrane and ear canal normal.  Mouth/Throat: Posterior oropharyngeal erythema present.  Cardiovascular: Normal rate, regular rhythm and normal heart sounds.   Pulmonary/Chest: Effort normal. No respiratory distress. She has decreased breath sounds. She has wheezes. She has rhonchi.  Musculoskeletal: Normal range of motion.  Neurological: She is alert and oriented to person, place, and time.  Skin: Skin is warm and dry. No rash noted.  Psychiatric: She has a normal mood and affect. Her behavior is normal. Thought content normal.  Nursing note and vitals reviewed.   BP 116/78   Pulse 87   Temp 97.4 F (36.3 C)   Ht 5\' 2"  (1.575 m)   Wt 134 lb (60.8 kg)   LMP 01/14/2009   SpO2 98%   BMI 24.51 kg/m   Assessment and Plan: 1. Bronchitis Smoking cessation recommended Samples of Advair 100 given - DG Chest 2 View; Future - predniSONE (DELTASONE) 10 MG tablet; Take 6 on day 1, 5 on day 2, 4 on day 3, 3 on day 4, 2 on day 5 and 1 on day 1 then stop.  Dispense: 21 tablet; Refill: 0 - levofloxacin (LEVAQUIN) 500 MG tablet; Take 1 tablet (500 mg total) by mouth daily.  Dispense: 10 tablet; Refill: 0  Bari Edward, MD [facility]  03/16/2016

## 2016-03-16 NOTE — Patient Instructions (Signed)
Advair 100/50 - one inhalation twice a day

## 2016-03-24 ENCOUNTER — Ambulatory Visit (INDEPENDENT_AMBULATORY_CARE_PROVIDER_SITE_OTHER): Payer: Self-pay | Admitting: Internal Medicine

## 2016-03-24 ENCOUNTER — Telehealth: Payer: Self-pay | Admitting: Internal Medicine

## 2016-03-24 ENCOUNTER — Encounter: Payer: Self-pay | Admitting: Internal Medicine

## 2016-03-24 VITALS — BP 110/68 | HR 105 | Temp 98.2°F | Ht 62.0 in | Wt 133.0 lb

## 2016-03-24 DIAGNOSIS — R0981 Nasal congestion: Secondary | ICD-10-CM

## 2016-03-24 DIAGNOSIS — R6889 Other general symptoms and signs: Secondary | ICD-10-CM

## 2016-03-24 LAB — POCT INFLUENZA A/B
INFLUENZA B, POC: NEGATIVE
Influenza A, POC: POSITIVE — AB

## 2016-03-24 MED ORDER — OSELTAMIVIR PHOSPHATE 75 MG PO CAPS: 75.0000 mg | ORAL_CAPSULE | Freq: Two times a day (BID) | ORAL | 0 refills | Status: DC

## 2016-03-24 NOTE — Progress Notes (Signed)
Date:  03/24/2016   Name:  Alexandra [name]   DOB:  1971/02/24   MRN:  [id number]   Chief Complaint: Sinus Problem (Pt stated body ache, fever, sinus pressure) Sinus Problem  This is a chronic problem. The problem has been gradually improving since onset. The maximum temperature recorded prior to her arrival was 100.4 - 100.9 F (yesterday with body aches). The fever has been present for less than 1 day. The pain is moderate. Associated symptoms include chills, coughing, headaches and sinus pressure. Pertinent negatives include no shortness of breath. Past treatments include acetaminophen. The treatment provided mild relief.   Still has 2 days of levaquin to finish from bronchitis.  Cough is much better, minimal and non productive.   Review of Systems  Constitutional: Positive for chills, fatigue and fever.  HENT: Positive for sinus pressure.   Respiratory: Positive for cough. Negative for chest tightness, shortness of breath and wheezing.   Cardiovascular: Negative for chest pain and palpitations.  Musculoskeletal: Positive for myalgias.  Neurological: Positive for headaches.    Patient Active Problem List   Diagnosis Date Noted  . Radiculopathy affecting upper extremity 02/16/2016  . Neck pain 02/16/2016  . Protrusion of cervical intervertebral disc 02/16/2016  . Depression 02/16/2016  . 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 Tobacco use disorder 02/09/2016    Prior to Admission medications   Medication Sig Start Date End Date Taking? Authorizing Provider  citalopram (CELEXA) 40 MG tablet Take 40 mg by mouth daily.   Yes Historical Provider, MD  levofloxacin (LEVAQUIN) 500 MG tablet Take 1 tablet (500 mg total) by mouth daily. 03/16/16  Yes Reubin Milan, MD    No Known Allergies  Past Surgical History:  Procedure Laterality Date  . ABDOMINAL HYSTERECTOMY    . BREAST CYST EXCISION Left 2006    Social History  Substance  Use Topics  . Smoking status: Current Every Day Smoker    Packs/day: 1.00    Years: 29.00    Types: Cigarettes  . Smokeless tobacco: Never Used  . Alcohol use 0.0 oz/week     Comment: social     Medication list has been reviewed and updated.   Physical Exam  Constitutional: She is oriented to person, place, and time. She appears well-developed. She has a sickly appearance. No distress.  HENT:  Head: Normocephalic and atraumatic.  Right Ear: Tympanic membrane and ear canal normal.  Left Ear: Tympanic membrane and ear canal normal.  Nose: Right sinus exhibits no maxillary sinus tenderness. Left sinus exhibits no maxillary sinus tenderness.  Mouth/Throat: Posterior oropharyngeal erythema present. No posterior oropharyngeal edema.  Cardiovascular: Normal rate, regular rhythm and normal heart sounds.   Pulmonary/Chest: Effort normal. No respiratory distress. She has no decreased breath sounds. She has no wheezes.  Musculoskeletal: Normal range of motion.  Neurological: She is alert and oriented to person, place, and time.  Skin: Skin is warm and dry. No rash noted.  Psychiatric: She has a normal mood and affect. Her behavior is normal. Thought content normal.  Nursing note and vitals reviewed.   BP 110/68   Pulse (!) 105   Temp 98.2 F (36.8 C)   Ht 5\' 2"  (1.575 m)   Wt 133 lb (60.3 kg)   LMP 01/14/2009   SpO2 96%   BMI 24.33 kg/m   Assessment and Plan: 1. Flu-like symptoms Continue tylenol or advil, rest, fluids Note to be out of work -  POCT Influenza A/B - oseltamivir (TAMIFLU) 75 MG capsule; Take 1 capsule (75 mg total) by mouth 2 (two) times daily.  Dispense: 10 capsule; Refill: 0  2. Sinus congestion Begin Flonase   Bari EdwardLaura Myeesha Shane, MD [facility]  03/24/2016

## 2016-03-24 NOTE — Patient Instructions (Signed)

## 2016-03-24 NOTE — Telephone Encounter (Signed)
Pt called stated start having fever  100.3, coughing---been seen Dr. Judithann GravesBerglund several time for the same problem since Nov.  Please Advice

## 2016-08-23 ENCOUNTER — Encounter: Payer: Self-pay | Admitting: Emergency Medicine

## 2016-08-23 ENCOUNTER — Emergency Department
Admission: EM | Admit: 2016-08-23 | Discharge: 2016-08-23 | Disposition: A | Discharge status: Home / Self Care | Payer: Self-pay | Attending: Emergency Medicine | Admitting: Emergency Medicine

## 2016-08-23 ENCOUNTER — Emergency Department: Payer: Self-pay

## 2016-08-23 DIAGNOSIS — S92531A Displaced fracture of distal phalanx of right lesser toe(s), initial encounter for closed fracture: Secondary | ICD-10-CM | POA: Insufficient documentation

## 2016-08-23 DIAGNOSIS — Y929 Unspecified place or not applicable: Secondary | ICD-10-CM | POA: Insufficient documentation

## 2016-08-23 DIAGNOSIS — Y999 Unspecified external cause status: Secondary | ICD-10-CM | POA: Insufficient documentation

## 2016-08-23 DIAGNOSIS — W010XXA Fall on same level from slipping, tripping and stumbling without subsequent striking against object, initial encounter: Secondary | ICD-10-CM | POA: Insufficient documentation

## 2016-08-23 DIAGNOSIS — Y939 Activity, unspecified: Secondary | ICD-10-CM | POA: Insufficient documentation

## 2016-08-23 DIAGNOSIS — F17210 Nicotine dependence, cigarettes, uncomplicated: Secondary | ICD-10-CM | POA: Insufficient documentation

## 2016-08-23 MED ORDER — OXYCODONE-ACETAMINOPHEN 7.5-325 MG PO TABS: 1.0000 | ORAL_TABLET | ORAL | 0 refills | Status: DC | PRN

## 2016-08-23 MED ORDER — IBUPROFEN 600 MG PO TABS: 600.0000 mg | ORAL_TABLET | Freq: Four times a day (QID) | ORAL | 0 refills | Status: DC | PRN

## 2016-08-23 NOTE — ED Notes (Signed)
See triage note  States she got her flip flop caught and she fell   Having pain to right 5 th toe and lateral foot

## 2016-08-23 NOTE — ED Triage Notes (Signed)
Caught toe 2 days ago, 5th toe R foot ecchymotic/

## 2016-08-23 NOTE — ED Provider Notes (Signed)
[facility] Emergency Department Provider Note   ____________________________________________   None    (approximate)  I have reviewed the triage vital signs and the nursing notes.   HISTORY  Chief Complaint Toe Pain    HPI Gilda CreaseWendy G Croy is a 46 y.o. female patient complaining of right fifth toe pain secondary to a contusion 2 days ago.Patient rates the pain as a 9/10. Patient described a pain as "achy". No palliative measures for complaint.   Past Medical History:  Diagnosis Date  . Breast discharge 2013   present X 3 years  Left only multiple ducts per pt  . Depression     Patient Active Problem List   Diagnosis Date Noted  . Radiculopathy affecting upper extremity 02/16/2016  . Neck pain 02/16/2016  . Protrusion of cervical intervertebral disc 02/16/2016  . Depression 02/16/2016  . 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 Tobacco use disorder 02/09/2016    Past Surgical History:  Procedure Laterality Date  . ABDOMINAL HYSTERECTOMY    . BREAST CYST EXCISION Left 2006    Prior to Admission medications   Medication Sig Start Date End Date Taking? Authorizing Provider  citalopram (CELEXA) 40 MG tablet Take 40 mg by mouth daily.    [provider]  ibuprofen (ADVIL,MOTRIN) 600 MG tablet Take 1 tablet (600 mg total) by mouth every 6 (six) hours as needed. 08/23/16   Joni ReiningSmith, Ronald K, PA-C  levofloxacin (LEVAQUIN) 500 MG tablet Take 1 tablet (500 mg total) by mouth daily. 03/16/16   Reubin MilanBerglund, Laura H, MD  oseltamivir (TAMIFLU) 75 MG capsule Take 1 capsule (75 mg total) by mouth 2 (two) times daily. 03/24/16   Reubin MilanBerglund, Laura H, MD  oxyCODONE-acetaminophen (PERCOCET) 7.5-325 MG tablet Take 1 tablet by mouth every 4 (four) hours as needed for severe pain. 08/23/16 08/23/17  Joni ReiningSmith, Ronald K, PA-C    Allergies Patient has no known allergies.  Family History  Problem Relation Age of Onset    . Breast cancer Mother 3644       Living  . Breast cancer Maternal Aunt        Living  . Breast cancer Maternal Grandmother        Living  . Breast cancer Maternal Aunt        Maternal Great Aunt Deceased    Social History Social History  Substance Use Topics  . Smoking status: Current Every Day Smoker    Packs/day: 1.00    Years: 29.00    Types: Cigarettes  . Smokeless tobacco: Never Used  . Alcohol use 0.0 oz/week     Comment: social    Review of Systems  Constitutional: No fever/chills Eyes: No visual changes. ENT: No sore throat. Cardiovascular: Denies chest pain. Respiratory: Denies shortness of breath. Gastrointestinal: No abdominal pain.  No nausea, no vomiting.  No diarrhea.  No constipation. Genitourinary: Negative for dysuria. Musculoskeletal: Negative for back pain. Skin: Negative for rash. Neurological: Negative for headaches, focal weakness or numbness. Psychiatric:Depression  ____________________________________________   PHYSICAL EXAM:  VITAL SIGNS: ED Triage Vitals  Enc Vitals Group     BP 08/23/16 0717 115/69     Pulse Rate 08/23/16 0717 85     Resp 08/23/16 0717 18     Temp 08/23/16 0717 99.1 F (37.3 C)     Temp Source 08/23/16 0717 Oral     SpO2 08/23/16 0717 99 %     Weight 08/23/16 0718 135 lb (  61.2 kg)     Height 08/23/16 0718 5\' 3"  (1.6 m)     Head Circumference --      Peak Flow --      Pain Score 08/23/16 0717 9     Pain Loc --      Pain Edu? --      Excl. in GC? --     Constitutional: Alert and oriented. Well appearing and in no acute distress. cervical lymphadenopathy. Cardiovascular: Normal rate, regular rhythm. Grossly normal heart sounds.  Good peripheral circulation. Respiratory: Normal respiratory effort.  No retractions. Lungs CTAB. Gastrointestinal: Soft and nontender. No distention. No abdominal bruits. No CVA tenderness. Musculoskeletal: No obvious deformity to the right fifth toe. Moderate guarding  palpation. Neurologic:  Normal speech and language. No gross focal neurologic deficits are appreciated. No gait instability. Skin:  Skin is warm, dry and intact. No rash noted. Ecchymosis Psychiatric: Mood and affect are normal. Speech and behavior are normal.  ____________________________________________   LABS (all labs ordered are listed, but only abnormal results are displayed)  Labs Reviewed - No data to display ____________________________________________  EKG   ____________________________________________  RADIOLOGY  Dg Foot Complete Right  Result Date: 08/23/2016 CLINICAL DATA:  Injury to right foot with fifth toe pain. Initial encounter. EXAM: RIGHT FOOT COMPLETE - 3+ VIEW COMPARISON:  01/03/2005 FINDINGS: There is oblique nondisplaced fracture through the proximal phalanx of the fifth toe. This does not extend into either the MTP joint or proximal interphalangeal joint. No associated dislocation. No other fractures identified. IMPRESSION: Nondisplaced fracture of the fifth proximal phalanx. Electronically Signed   By: Irish Lack M.D.   On: 08/23/2016 07:47    _X-ray reveals right fifth toe fracture ___________________________________________   PROCEDURES  Procedure(s) performed: None  Procedures  Critical Care performed: No  ____________________________________________   INITIAL IMPRESSION / ASSESSMENT AND PLAN / ED COURSE  Pertinent labs & imaging results that were available during my care of the patient were reviewed by me and considered in my medical decision making (see chart for details).  Right fifth toe fracture. Discussed x-ray finding with patient. Patient toe was buddy tape. Patient placed an open shoe. Patient given discharge Instructions and a work note. [facility]      ____________________________________________   FINAL CLINICAL IMPRESSION(S) / ED DIAGNOSES  Final diagnoses:  Displaced  fracture of distal phalanx of right lesser toe(s), initial encounter for closed fracture      NEW MEDICATIONS STARTED DURING THIS VISIT:  New Prescriptions   IBUPROFEN (ADVIL,MOTRIN) 600 MG TABLET    Take 1 tablet (600 mg total) by mouth every 6 (six) hours as needed.   OXYCODONE-ACETAMINOPHEN (PERCOCET) 7.5-325 MG TABLET    Take 1 tablet by mouth every 4 (four) hours as needed for severe pain.     Note:  This document was prepared using Dragon voice recognition software and may include unintentional dictation errors.    Joni Reining, PA-C 08/23/16 1610    Merrily Brittle, MD 08/23/16 1057

## 2016-08-23 NOTE — Discharge Instructions (Signed)
Buddy tape toe 3-4 weeks and wear open shoe

## 2017-01-31 ENCOUNTER — Encounter (INDEPENDENT_AMBULATORY_CARE_PROVIDER_SITE_OTHER): Payer: Self-pay

## 2017-01-31 ENCOUNTER — Ambulatory Visit
Admission: RE | Admit: 2017-01-31 | Discharge: 2017-01-31 | Disposition: A | Discharge status: Home / Self Care | Payer: Self-pay | Source: Ambulatory Visit | Attending: Oncology | Admitting: Oncology

## 2017-01-31 ENCOUNTER — Ambulatory Visit: Payer: Self-pay | Attending: Oncology | Admitting: *Deleted

## 2017-01-31 VITALS — BP 99/66 | HR 74 | Temp 98.3°F

## 2017-01-31 DIAGNOSIS — N63 Unspecified lump in unspecified breast: Secondary | ICD-10-CM

## 2017-01-31 NOTE — Patient Instructions (Signed)
Gave patient hand-out, Women Staying Healthy, Active and Well from [facility], with education on breast health, pap smears, heart and colon health. 

## 2017-01-31 NOTE — Progress Notes (Signed)
Subjective:     Patient ID: Alexandra [name], female   DOB: [birth year], 46 y.o.   MRN: [id number]  HPI   Review of Systems     Objective:   Physical Exam  Pulmonary/Chest: Right breast exhibits no inverted nipple, no mass, no nipple discharge, no skin change and no tenderness. Left breast exhibits no inverted nipple, no mass, no nipple discharge, no skin change and no tenderness. Breasts are asymmetrical.  Left breast is slightly larger than the right  Genitourinary: No labial fusion. There is no rash, tenderness, lesion or injury on the right labia. There is no tenderness, lesion or injury on the left labia. Right adnexum displays no mass, no tenderness and no fullness. Left adnexum displays no mass, no tenderness and no fullness. No erythema, tenderness or bleeding in the vagina. No foreign body in the vagina. No signs of injury around the vagina. No vaginal discharge found.  Genitourinary Comments: Patient had a hysterectomy, but still has her ovaries       Assessment:     46 year old White female returns to [facility] for annual screening.  Patient with significant family history of breast cancer.  Mom has had breast cancer 3 times, but is BRCA negative.  Her maternal aunts have had breast cancer, her maternal grandmother, and maternal great aunt have had breast cancer.  Also states a cousin now has breast cancer at age 47. Encouraged patient to get annual screening mammograms due to family history.  Clinical breast exam unremarkable.  Taught self breast awareness.  Pelvic exam without masses or lesions.  Patient still has her ovaries, but has a hysterectomy.  Patient has been screened for eligibility.  She does not have any insurance, Medicare or Medicaid.  She also meets financial eligibility.  Hand-out given on the Affordable Care Act.     Plan:     Bilateral diagnostic mammogram and ultrasound ordered for 1 year follow of a left breast nodule.  Will follow-up per [facility] protocol.

## 2017-02-07 ENCOUNTER — Encounter: Payer: Self-pay | Admitting: *Deleted

## 2017-02-07 NOTE — Progress Notes (Signed)
Letter mailed from the [facility] to inform patient of her normal mammogram results.  Patient is to follow-up with annual screening in one year.  HSIS to Christy. 

## 2017-02-08 ENCOUNTER — Ambulatory Visit: Payer: PRIVATE HEALTH INSURANCE | Admitting: General Surgery

## 2017-02-16 ENCOUNTER — Encounter: Payer: Self-pay | Admitting: General Surgery

## 2017-02-16 ENCOUNTER — Ambulatory Visit (INDEPENDENT_AMBULATORY_CARE_PROVIDER_SITE_OTHER): Payer: Self-pay | Admitting: General Surgery

## 2017-02-16 VITALS — BP 98/62 | HR 74 | Resp 12 | Ht 63.0 in | Wt 131.0 lb

## 2017-02-16 DIAGNOSIS — Z1231 Encounter for screening mammogram for malignant neoplasm of breast: Secondary | ICD-10-CM

## 2017-02-16 DIAGNOSIS — Z803 Family history of malignant neoplasm of breast: Secondary | ICD-10-CM

## 2017-02-16 NOTE — Patient Instructions (Signed)
[facility] with a bilateral screening mammogram. The patient is aware to call back for any questions or concerns

## 2017-02-16 NOTE — Progress Notes (Signed)
Patient ID: Alexandra [name], female   DOB: 1971-03-04, 46 y.o.   MRN: [id number]  Chief Complaint  Patient presents with  . Follow-up    HPI Alexandra [name] is a [age] y.o. female.  who presents for a breast evaluation. The most recent mammogram was done on 01-31-17.  Patient does perform regular self breast checks and gets regular mammograms done.   No new breast issues.genetic testing  was negative.   HPI  Past Medical History:  Diagnosis Date  . Breast discharge 2013   present X 3 years  Left only multiple ducts per pt  . Depression     Past Surgical History:  Procedure Laterality Date  . ABDOMINAL HYSTERECTOMY    . BREAST CYST EXCISION Left 2006    Family History  Problem Relation Age of Onset  . Breast cancer Mother 1244       Living  . Breast cancer Maternal Aunt        Living  . Breast cancer Maternal Grandmother        Living  . Breast cancer Maternal Aunt        Maternal Great Aunt Deceased  . Breast cancer Cousin [id number]    Social History Social History   Tobacco Use  . Smoking status: Current Every Day Smoker    Packs/day: 1.00    Years: 29.00    Pack years: 29.00    Types: Cigarettes  . Smokeless tobacco: Never Used  Substance Use Topics  . Alcohol use: Yes    Alcohol/week: 0.0 oz    Comment: social  . Drug use: No    No Known Allergies  Current Outpatient Medications  Medication Sig Dispense Refill  . citalopram (CELEXA) 40 MG tablet Take 40 mg by mouth daily.    Marland Kitchen. ibuprofen (ADVIL,MOTRIN) 600 MG tablet Take 1 tablet (600 mg total) by mouth every 6 (six) hours as needed. 30 tablet 0   No current facility-administered medications for this visit.     Review of Systems Review of Systems  Constitutional: Negative.   Respiratory: Negative.   Cardiovascular: Negative.     Blood pressure 98/62, pulse 74, resp. rate 12, height 5\' 3"  (1.6 m), weight 131 lb (59.4 kg), last menstrual period 01/14/2009.  Physical Exam Physical Exam  Constitutional:  She is oriented to person, place, and time. She appears well-developed and well-nourished.  HENT:  Mouth/Throat: Oropharynx is clear and moist.  Eyes: Conjunctivae are normal. No scleral icterus.  Neck: Neck supple.  Cardiovascular: Normal rate, regular rhythm and normal heart sounds.  Pulmonary/Chest: Effort normal and breath sounds normal. No respiratory distress. Right breast exhibits no inverted nipple, no mass, no nipple discharge, no skin change and no tenderness. Left breast exhibits no inverted nipple, no mass, no nipple discharge, no skin change and no tenderness.  Lymphadenopathy:    She has no cervical adenopathy.    She has no axillary adenopathy.  Neurological: She is alert and oriented to person, place, and time.  Skin: Skin is warm and dry.  Psychiatric: Her behavior is normal.    Data Reviewed Mammogram reviewed   Assessment       Family history of breast cancer, genetic testing was negative. Exam is stable   Plan       [facility]  [facility] one year with a bilateral screening mammogram. The patient is aware to call back for any questions or concerns.   HPI, Physical Exam,  Assessment and Plan have been scribed under the direction and in the presence of Kathreen CosierS. [name]. Giancarlos Berendt, MD Alexandra DaftMarsha Hatch, RN I have completed the exam and reviewed the above documentation for accuracy and completeness.  I agree with the above.  [employer] has been used and any errors in dictation or transcription are unintentional.  Alexandra [name], M.D., F.A.C.S.  Alexandra [name],Alexandra [name] 02/25/2017, 11:31 AM

## 2017-03-25 ENCOUNTER — Encounter: Payer: Self-pay | Admitting: *Deleted

## 2017-03-25 ENCOUNTER — Other Ambulatory Visit: Payer: Self-pay

## 2017-03-25 ENCOUNTER — Emergency Department: Payer: Self-pay

## 2017-03-25 DIAGNOSIS — Z79899 Other long term (current) drug therapy: Secondary | ICD-10-CM | POA: Insufficient documentation

## 2017-03-25 DIAGNOSIS — J40 Bronchitis, not specified as acute or chronic: Secondary | ICD-10-CM | POA: Insufficient documentation

## 2017-03-25 DIAGNOSIS — R091 Pleurisy: Secondary | ICD-10-CM | POA: Insufficient documentation

## 2017-03-25 DIAGNOSIS — F17210 Nicotine dependence, cigarettes, uncomplicated: Secondary | ICD-10-CM | POA: Insufficient documentation

## 2017-03-25 LAB — CBC
HEMATOCRIT: 39.9 % (ref 35.0–47.0)
Hemoglobin: 13.2 g/dL (ref 12.0–16.0)
MCH: 28.9 pg (ref 26.0–34.0)
MCHC: 33 g/dL (ref 32.0–36.0)
MCV: 87.5 fL (ref 80.0–100.0)
PLATELETS: 484 10*3/uL — AB (ref 150–440)
RBC: 4.56 MIL/uL (ref 3.80–5.20)
RDW: 14.9 % — AB (ref 11.5–14.5)
WBC: 13.1 10*3/uL — ABNORMAL HIGH (ref 3.6–11.0)

## 2017-03-25 LAB — TROPONIN I: Troponin I: <0.03 ng/mL (ref <0.03)

## 2017-03-25 LAB — BASIC METABOLIC PANEL
Anion gap: 11 (ref 5–15)
BUN: 17 mg/dL (ref 6–20)
CHLORIDE: 102 mmol/L (ref 101–111)
CO2: 28 mmol/L (ref 22–32)
Calcium: 8.9 mg/dL (ref 8.9–10.3)
Creatinine, Ser: 0.49 mg/dL (ref 0.44–1.00)
Glucose, Bld: 86 mg/dL (ref 65–99)
POTASSIUM: 4.1 mmol/L (ref 3.5–5.1)
SODIUM: 141 mmol/L (ref 135–145)

## 2017-03-25 MED ORDER — ALBUTEROL SULFATE (2.5 MG/3ML) 0.083% IN NEBU: 5.0000 mg | INHALATION_SOLUTION | Freq: Once | RESPIRATORY_TRACT | Status: DC

## 2017-03-25 NOTE — ED Triage Notes (Signed)
Pt reports cough and congestion for the past three weeks. Yellow sputum but no fevers reported. Pt now reporting she has pain in left ribs and increased pain when coughing. 2 episodes of vomiting reports but no nausea or diarrhea.

## 2017-03-26 ENCOUNTER — Emergency Department
Admission: EM | Admit: 2017-03-26 | Discharge: 2017-03-26 | Disposition: A | Discharge status: Home / Self Care | Payer: Self-pay | Attending: Emergency Medicine | Admitting: Emergency Medicine

## 2017-03-26 ENCOUNTER — Encounter: Payer: Self-pay | Admitting: Emergency Medicine

## 2017-03-26 DIAGNOSIS — J40 Bronchitis, not specified as acute or chronic: Secondary | ICD-10-CM

## 2017-03-26 DIAGNOSIS — R091 Pleurisy: Secondary | ICD-10-CM

## 2017-03-26 MED ORDER — KETOROLAC TROMETHAMINE 60 MG/2ML IM SOLN
60.0000 mg | Freq: Once | INTRAMUSCULAR | Status: AC
  Administered 2017-03-26: 60 mg via INTRAMUSCULAR
  Filled 2017-03-26: qty 2

## 2017-03-26 MED ORDER — BENZONATATE 100 MG PO CAPS
100.0000 mg | ORAL_CAPSULE | Freq: Once | ORAL | Status: AC
  Administered 2017-03-26: 100 mg via ORAL
  Filled 2017-03-26: qty 1

## 2017-03-26 MED ORDER — PREDNISONE 20 MG PO TABS: 60.0000 mg | ORAL_TABLET | Freq: Every day | ORAL | 0 refills | Status: DC

## 2017-03-26 MED ORDER — BENZONATATE 100 MG PO CAPS: 100.0000 mg | ORAL_CAPSULE | Freq: Four times a day (QID) | ORAL | 0 refills | Status: DC | PRN

## 2017-03-26 MED ORDER — ALBUTEROL SULFATE HFA 108 (90 BASE) MCG/ACT IN AERS: 2.0000 | INHALATION_SPRAY | Freq: Four times a day (QID) | RESPIRATORY_TRACT | 0 refills | Status: DC | PRN

## 2017-03-26 MED ORDER — IBUPROFEN 800 MG PO TABS: 800.0000 mg | ORAL_TABLET | Freq: Three times a day (TID) | ORAL | 0 refills | Status: DC | PRN

## 2017-03-26 MED ORDER — PREDNISONE 20 MG PO TABS
60.0000 mg | ORAL_TABLET | Freq: Once | ORAL | Status: AC
  Administered 2017-03-26: 60 mg via ORAL
  Filled 2017-03-26: qty 3

## 2017-03-26 MED ORDER — IPRATROPIUM-ALBUTEROL 0.5-2.5 (3) MG/3ML IN SOLN
3.0000 mL | Freq: Once | RESPIRATORY_TRACT | Status: AC
  Administered 2017-03-26: 3 mL via RESPIRATORY_TRACT
  Filled 2017-03-26: qty 3

## 2017-03-26 NOTE — Discharge Instructions (Signed)
Please follow up with your primary care physician.

## 2017-03-26 NOTE — ED Notes (Signed)
Pt complains of left lateral rib pain with movement, palpation, deep inspiration, cough. Pt states she was at work today, coughing, when she began to experience left sided rib pain. Pt states she has had a cough for 3 weeks after contacting an URI. Pt states has had intermittent chills with possible fever.

## 2017-03-26 NOTE — ED Provider Notes (Signed)
[facility] Emergency Department Provider Note   ____________________________________________   First MD Initiated Contact with Patient 03/26/17 0021     (approximate)  I have reviewed the triage vital signs and the nursing notes.   HISTORY  Chief Complaint Cough and Chest Pain    HPI Alexandra [name] is a 47 y.o. female who comes into the hospital today stating that her side hurts.  She reports that she wanted to make sure she then pulled something or crack a rib.  The patient has been sick for the past 3 weeks and has been coughing significantly.  She reports that she coughs so bad that she started having this pain in her left lateral chest.  She reports now she cannot cough and the pain is so bad.  She has been taking Tylenol cold and flu with some Robitussin which has not been helping.  The patient states that her pain is a 15 out of 10 in intensity.  She states that when she coughs or breathes more it hurts.  She feels like something is moving when she touches her chest.  She states that she has had fevers with the last one being 101 about a week ago.  Her daughter was sick initially and then the patient states that she started having the symptoms.  The patient is here today for evaluation.  Past Medical History:  Diagnosis Date  . Breast discharge 2013   present X 3 years  Left only multiple ducts per pt  . Depression     Patient Active Problem List   Diagnosis Date Noted  . Radiculopathy affecting upper extremity 02/16/2016  . Neck pain 02/16/2016  . Protrusion of cervical intervertebral disc 02/16/2016  . Depression 02/16/2016  . 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 Tobacco use disorder 02/09/2016    Past Surgical History:  Procedure Laterality Date  . ABDOMINAL HYSTERECTOMY    . BREAST CYST EXCISION Left 2006    Prior to Admission medications   Medication Sig Start Date End Date Taking?  Authorizing Provider  citalopram (CELEXA) 40 MG tablet Take 40 mg by mouth daily.   Yes [provider]  albuterol (PROVENTIL HFA;VENTOLIN HFA) 108 (90 Base) MCG/ACT inhaler Inhale 2 puffs into the lungs every 6 (six) hours as needed. 03/26/17   Rebecka Apley, MD  benzonatate (TESSALON PERLES) 100 MG capsule Take 1 capsule (100 mg total) by mouth every 6 (six) hours as needed for cough. 03/26/17   Rebecka Apley, MD  ibuprofen (ADVIL,MOTRIN) 600 MG tablet Take 1 tablet (600 mg total) by mouth every 6 (six) hours as needed. 08/23/16   Joni Reining, PA-C  ibuprofen (ADVIL,MOTRIN) 800 MG tablet Take 1 tablet (800 mg total) by mouth every 8 (eight) hours as needed. 03/26/17   Rebecka Apley, MD  predniSONE (DELTASONE) 20 MG tablet Take 3 tablets (60 mg total) by mouth daily. 03/26/17   Rebecka Apley, MD    Allergies Patient has no known allergies.  Family History  Problem Relation Age of Onset  . Breast cancer Mother 72       Living  . Breast cancer Maternal Aunt        Living  . Breast cancer Maternal Grandmother        Living  . Breast cancer Maternal Aunt        Maternal Great Aunt Deceased  . Breast cancer Cousin 31  Social History Social History   Tobacco Use  . Smoking status: Current Every Day Smoker    Packs/day: 1.00    Years: 29.00    Pack years: 29.00    Types: Cigarettes  . Smokeless tobacco: Never Used  Substance Use Topics  . Alcohol use: Yes    Alcohol/week: 0.0 oz    Comment: social  . Drug use: No    Review of Systems  Constitutional:  fever/chills Eyes: No visual changes. ENT: No sore throat. Cardiovascular: Left lateral chest pain. Respiratory: Cough and shortness of breath. Gastrointestinal: No abdominal pain.  No nausea, no vomiting.  No diarrhea.  No constipation. Genitourinary: Negative for dysuria. Musculoskeletal: Negative for back pain. Skin: Negative for rash. Neurological: Negative for headaches, focal weakness or  numbness.   ____________________________________________   PHYSICAL EXAM:  VITAL SIGNS: ED Triage Vitals  Enc Vitals Group     BP 03/25/17 1952 (!) 114/46     Pulse Rate 03/25/17 1952 97     Resp 03/25/17 1952 16     Temp 03/25/17 1952 98.8 F (37.1 C)     Temp Source 03/25/17 1952 Oral     SpO2 03/25/17 1952 100 %     Weight 03/25/17 1953 130 lb (59 kg)     Height 03/25/17 1953 5\' 3"  (1.6 m)     Head Circumference --      Peak Flow --      Pain Score 03/25/17 1952 10     Pain Loc --      Pain Edu? --      Excl. in GC? --     Constitutional: Alert and oriented. Well appearing and in moderate distress. Eyes: Conjunctivae are normal. PERRL. EOMI. Head: Atraumatic. Nose: No congestion/rhinnorhea. Mouth/Throat: Mucous membranes are moist.  Oropharynx non-erythematous. Cardiovascular: Normal rate, regular rhythm. Grossly normal heart sounds.  Good peripheral circulation. Respiratory: Normal respiratory effort.  No retractions.  Mildly prolonged expiratory phase with some slight wheezes.  Left lateral chest tenderness to palpation Gastrointestinal: Soft and nontender. No distention.  Musculoskeletal: No lower extremity tenderness nor edema.  Neurologic:  Normal speech and language.  Skin:  Skin is warm, dry and intact.  Psychiatric: Mood and affect are normal.   ____________________________________________   LABS (all labs ordered are listed, but only abnormal results are displayed)  Labs Reviewed  CBC - Abnormal; Notable for the following components:      Result Value   WBC 13.1 (*)    RDW 14.9 (*)    Platelets 484 (*)    All other components within normal limits  BASIC METABOLIC PANEL  TROPONIN I   ____________________________________________  EKG  ED ECG REPORT I, Rebecka ApleyWebster,  Corene Resnick P, the attending physician, personally viewed and interpreted this ECG.   Date: 03/25/2017  EKG Time: 1957  Rate: 89  Rhythm: normal sinus rhythm  Axis: normal   Intervals:none  ST&T Change: none  ____________________________________________  RADIOLOGY  Dg Chest 2 View  Result Date: 03/25/2017 CLINICAL DATA:  Cough and congestion for 3 weeks EXAM: CHEST  2 VIEW COMPARISON:  March 16, 2016 FINDINGS: The heart size and mediastinal contours are within normal limits. There is no focal infiltrate, pulmonary edema, or pleural effusion. Minimal atelectasis of both lung bases are noted. The visualized skeletal structures are unremarkable. IMPRESSION: No focal pneumonia.  Minimal atelectasis of both lung bases. Electronically Signed   By: Sherian ReinWei-Chen  Lin M.D.   On: 03/25/2017 20:13    ____________________________________________   PROCEDURES  Procedure(s) performed: None  Procedures  Critical Care performed: No  ____________________________________________   INITIAL IMPRESSION / ASSESSMENT AND PLAN / ED COURSE  As part of my medical decision making, I reviewed the following data within the electronic MEDICAL RECORD NUMBER Notes from prior ED visits and [place] Controlled Substance Database   This is a 47 year old female who comes into the hospital today with a cough and left-sided chest pain.  The patient did receive some blood work which showed a white blood cell count of 13.1 and a chest x-ray which did not show any pneumonia.  Differential diagnosis includes pneumonia, pleurisy, musculoskeletal pain, costochondritis.  I did treat the patient with some prednisone, Toradol and a DuoNeb treatment.  The patient also received benzonatate.  The patient states that her pain improved significantly and she was able to cough without significant pain. The patient's lung sounds were improved as well. She will be discharged to home to follow up. The patient has no other complaints.      ____________________________________________   FINAL CLINICAL IMPRESSION(S) / ED DIAGNOSES  Final diagnoses:  Bronchitis  Pleurisy     ED Discharge Orders         Ordered    albuterol (PROVENTIL HFA;VENTOLIN HFA) 108 (90 Base) MCG/ACT inhaler  Every 6 hours PRN     03/26/17 0146    predniSONE (DELTASONE) 20 MG tablet  Daily     03/26/17 0146    benzonatate (TESSALON PERLES) 100 MG capsule  Every 6 hours PRN     03/26/17 0146    ibuprofen (ADVIL,MOTRIN) 800 MG tablet  Every 8 hours PRN     03/26/17 0146       Note:  This document was prepared using Dragon voice recognition software and may include unintentional dictation errors.    Rebecka Apley, MD 03/26/17 0800

## 2017-03-26 NOTE — ED Notes (Signed)
Pt provided with 2 warm blankets. Pt states "I'm not waiting for another hour to see a doctor, i've already been here five hours." pt updated on delay and evaluation and treatment process.

## 2017-07-24 IMAGING — CT CT HEAD W/O CM
3 series · 16 of 45 positions shown, 19 images · non-contrast
Comparison: CT of the head performed 12/25/2004

CLINICAL DATA: Acute onset of headache and right-sided head
tenderness. Nausea. Initial encounter.

EXAM:
CT HEAD WITHOUT CONTRAST
TECHNIQUE: Contiguous axial images were obtained from the base of the skull
through the vertex without intravenous contrast.

[Series 2: head wo · axial · 0.40mm/px · z∈[-68,+47]mm · 10 of 28 slices shown, 13 images]
[im 3/28  brain]
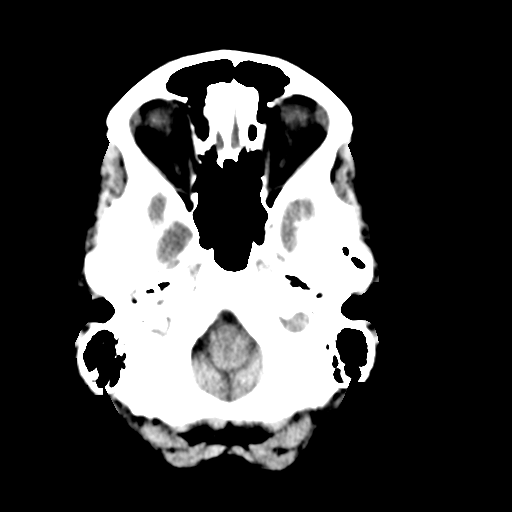
[im 3/28  bone]
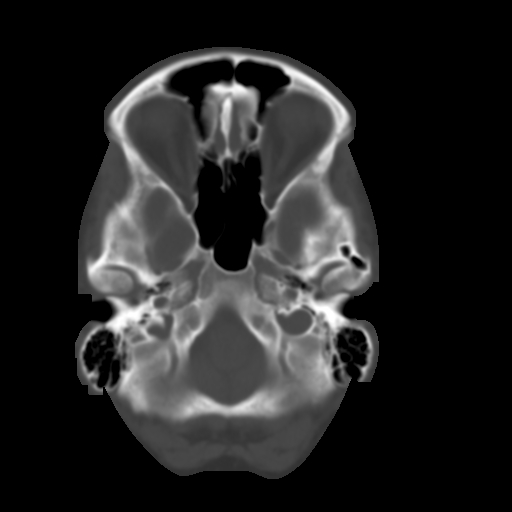
[im 5/28  brain]
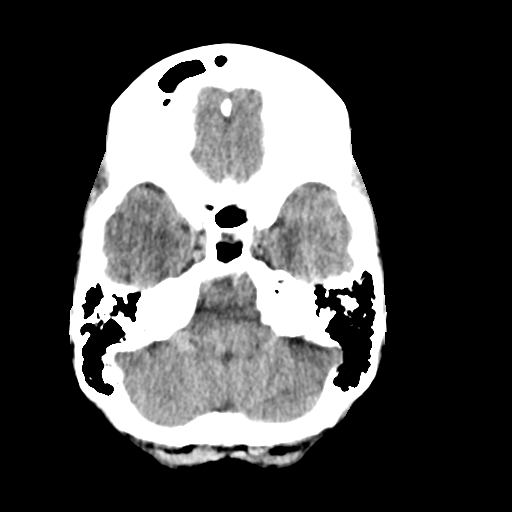
[im 8/28  brain]
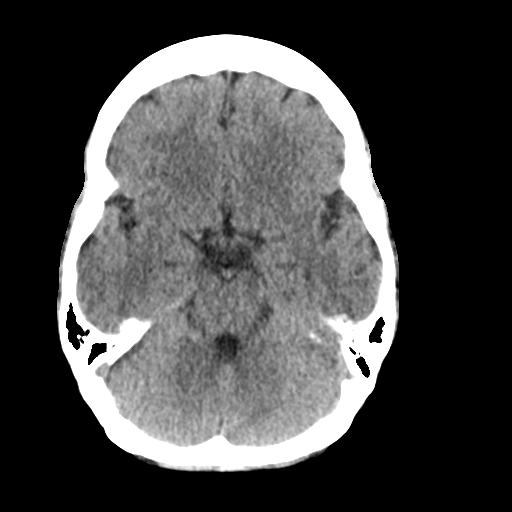
[im 11/28  brain]
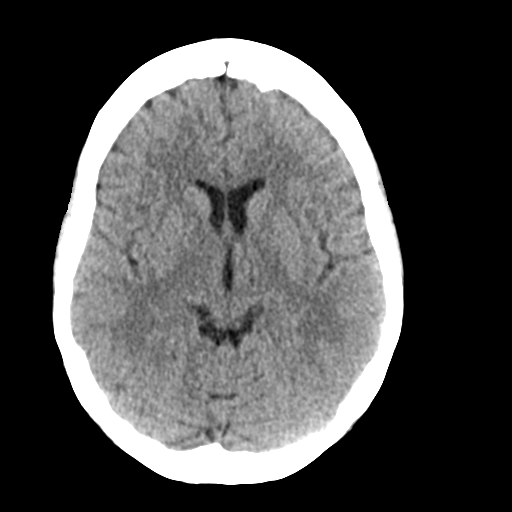
[im 13/28  brain]
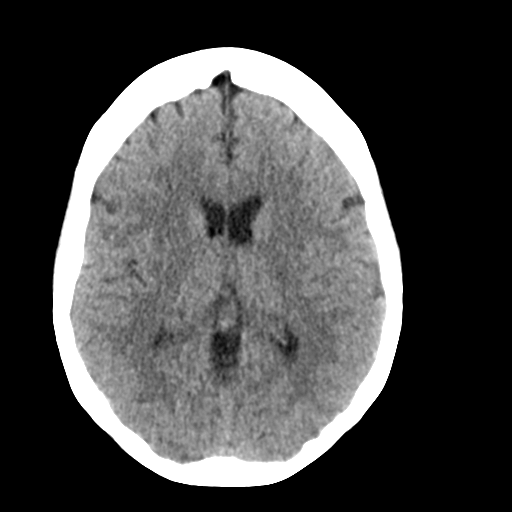
[im 13/28  bone]
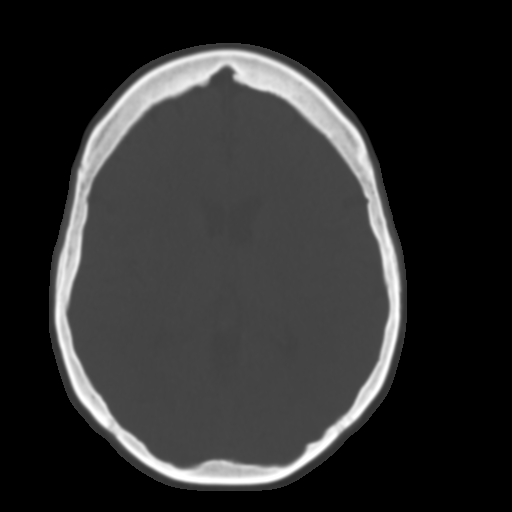
[im 16/28  brain]
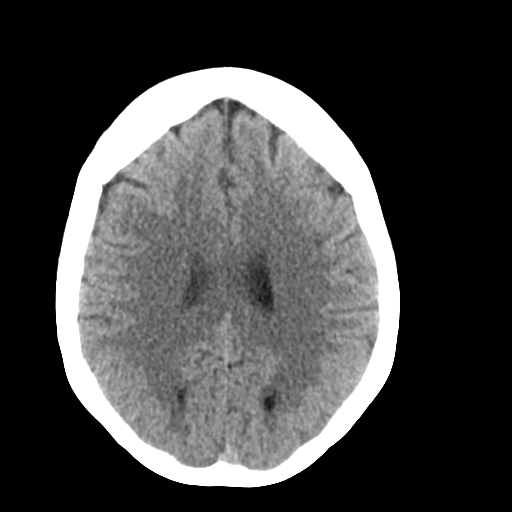
[im 18/28  brain]
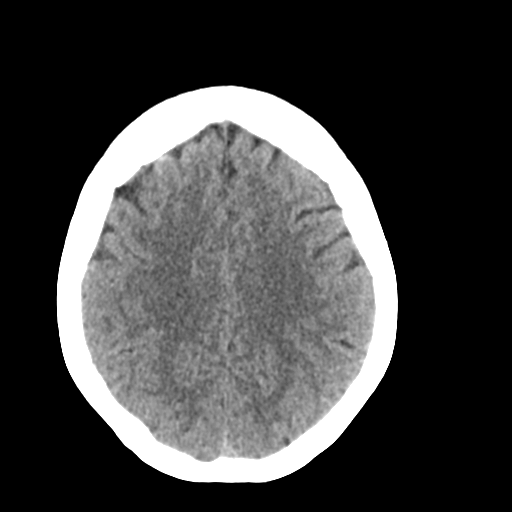
[im 21/28  brain]
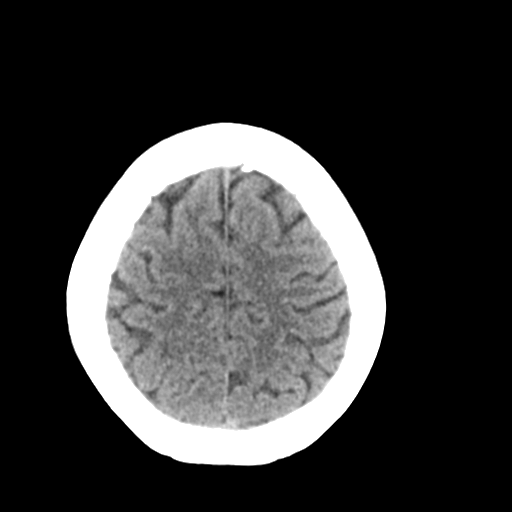
[im 24/28  brain]
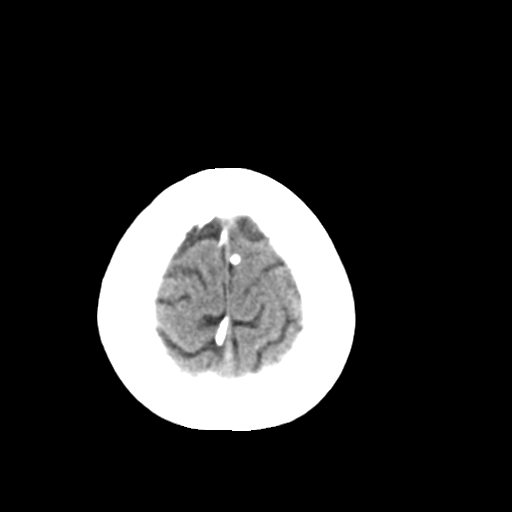
[im 24/28  bone]
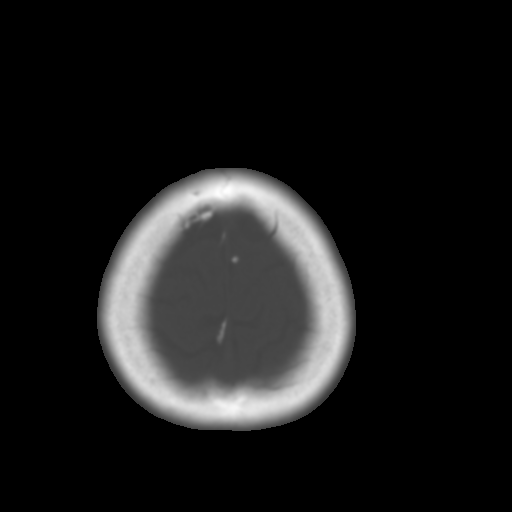
[im 26/28  brain]
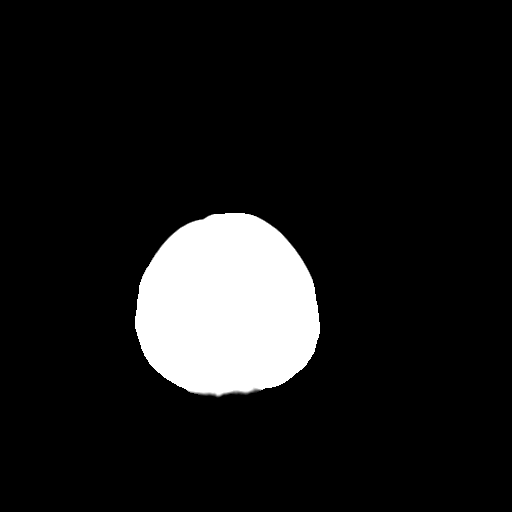

[Series 4: coronal soft tissue · coronal · 0.30mm/px · 3 of 63 slices shown]
[im 21/63  brain]
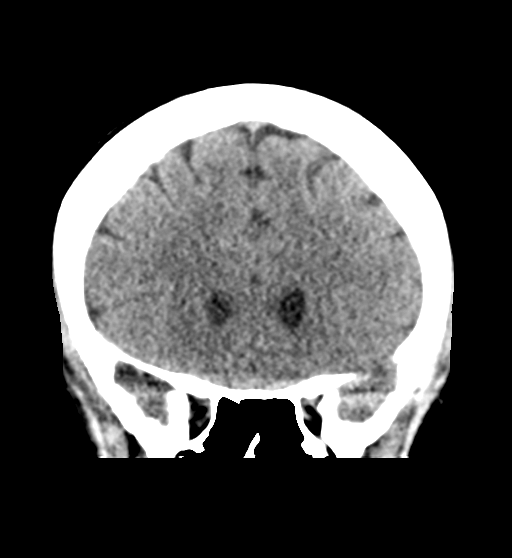
[im 28/63  brain]
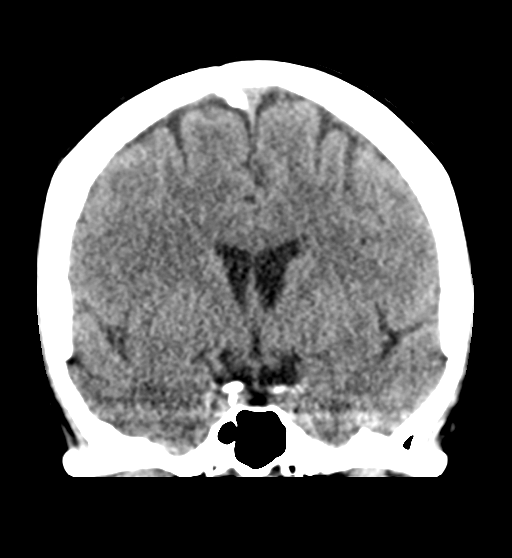
[im 35/63  brain]
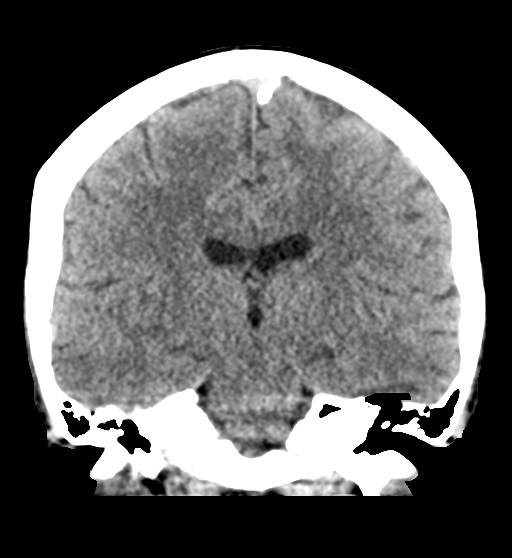

[Series 5: sagittal soft tissue · sagittal · 0.31mm/px · 3 of 49 slices shown]
[im 17/49  brain]
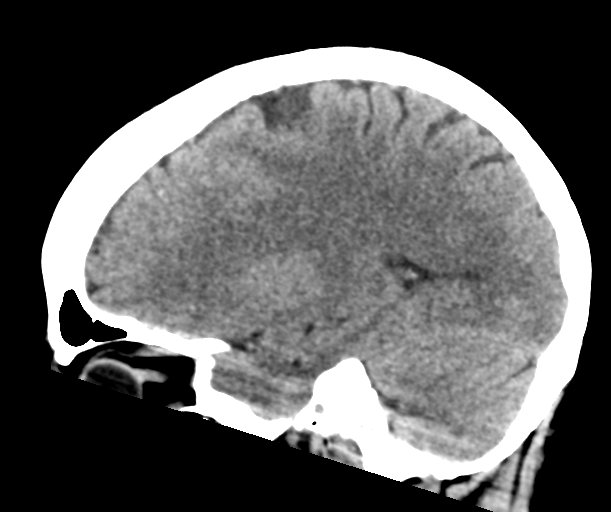
[im 25/49  brain]
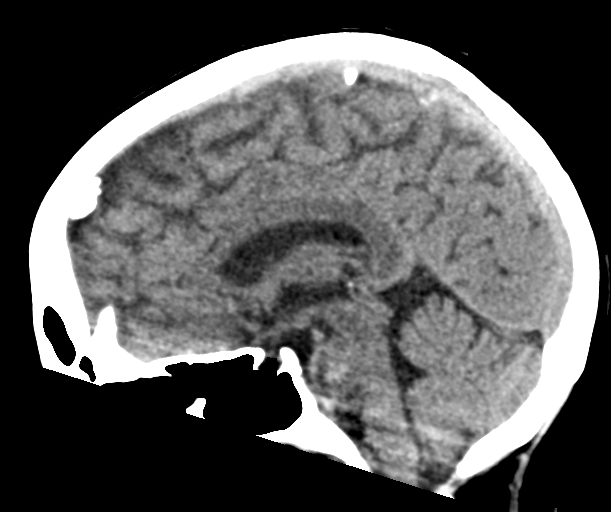
[im 33/49  brain]
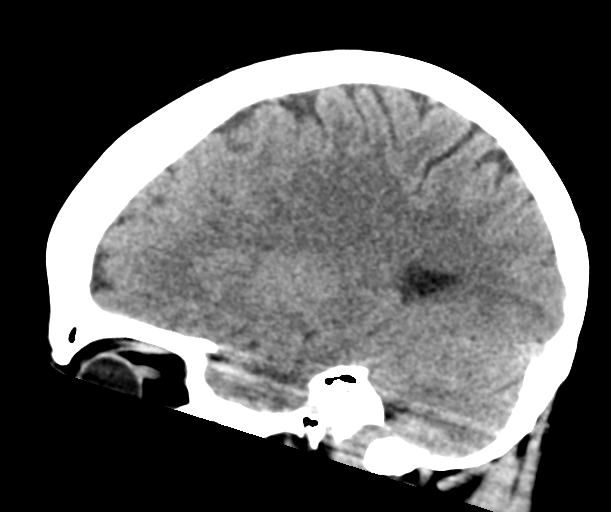

[16 of 45 positions shown; findings below may reference images not displayed]

FINDINGS: Brain: No evidence of acute infarction, hemorrhage, hydrocephalus,
extra-axial collection or mass lesion/mass effect.

The posterior fossa, including the cerebellum, brainstem and fourth
ventricle, is within normal limits. The third and lateral
ventricles, and basal ganglia are unremarkable in appearance. The
cerebral hemispheres are symmetric in appearance, with normal
gray-white differentiation. No mass effect or midline shift is seen.

Vascular: No hyperdense vessel or unexpected calcification.

Skull: There is no evidence of fracture; visualized osseous
structures are unremarkable in appearance.

Sinuses/Orbits: The visualized portions of the orbits are within
normal limits. The paranasal sinuses and mastoid air cells are
well-aerated.

Other: No significant soft tissue abnormalities are seen.
IMPRESSION: Unremarkable noncontrast CT of the head.

## 2018-07-05 ENCOUNTER — Ambulatory Visit: Payer: Self-pay

## 2019-01-04 ENCOUNTER — Other Ambulatory Visit: Payer: Self-pay

## 2019-01-05 ENCOUNTER — Other Ambulatory Visit: Payer: Self-pay

## 2019-01-05 DIAGNOSIS — Z20822 Contact with and (suspected) exposure to covid-19: Secondary | ICD-10-CM

## 2019-01-07 LAB — NOVEL CORONAVIRUS, NAA: SARS-CoV-2, NAA: NOT DETECTED

## 2019-01-08 ENCOUNTER — Telehealth: Payer: Self-pay

## 2019-01-08 NOTE — Telephone Encounter (Signed)
2nd call attempted prior to [facility] appointment on 01/10/2019.

## 2019-01-09 ENCOUNTER — Ambulatory Visit: Payer: Self-pay | Attending: Oncology | Admitting: *Deleted

## 2019-01-09 ENCOUNTER — Other Ambulatory Visit: Payer: Self-pay

## 2019-01-09 ENCOUNTER — Ambulatory Visit
Admission: RE | Admit: 2019-01-09 | Discharge: 2019-01-09 | Disposition: A | Discharge status: Home / Self Care | Payer: Self-pay | Source: Ambulatory Visit | Attending: Oncology | Admitting: Oncology

## 2019-01-09 VITALS — BP 105/62 | HR 70 | Temp 98.9°F | Ht 63.0 in | Wt 127.5 lb

## 2019-01-09 DIAGNOSIS — Z0000 Encounter for general adult medical examination without abnormal findings: Secondary | ICD-10-CM

## 2019-01-09 NOTE — Patient Instructions (Signed)
Gave patient hand-out, Women Staying Healthy, Active and Well from [facility], with education on breast health, pap smears, heart and colon health. 

## 2019-01-09 NOTE — Progress Notes (Signed)
  Subjective:     Patient ID: Alexandra [name], female   DOB: [birth year], 48 y.o.   MRN: [id number]  HPI   Review of Systems     Objective:   Physical Exam Chest:     Breasts:        Right: Nipple discharge present. No swelling, bleeding, inverted nipple, mass or skin change.        Left: Nipple discharge present. No swelling, bleeding, inverted nipple, mass, skin change or tenderness.       Comments: Patient states she has had bilateral nipple discharge for approximately 8 years.  States it gets "crusty" and she removes it. Lymphadenopathy:     Upper Body:     Right upper body: No supraclavicular or axillary adenopathy.     Left upper body: No supraclavicular or axillary adenopathy.        Assessment:     48 year old White female returns to [facility] for annual screening.  Complains of bilateral nipple discharge for approximately 8 years.  States it is unchanged.  States it gets "crusty" and she removes it.  Patient has a significant family history of breast cancer mom, maternal grandmother, maternal great aunt, maternal aunt and maternal first cousin.  Patient's mom had breast cancer 3 times and is BRCA negative.  Tyrer-Cuzick breast cancer risk assessment with a lifetime risk of 17%.  Per NCCN guidelines no further imagaing or genetic testing is recommended.  She does meet NCCN guidelines for testing, but since patient's mom is BRCA negative, I don't think she will need testing.  I will confirm that with our genetics counselor Faith Rogue and inform patient if she needs to be tested.  At the minimum, patient is aware that she needs annual mammograms.  Pap smear omitted.  Patient with a history of hysterectomy for cervical cancer.  Last pap in 2016 was negative / negative.  Next pap due in 1 year.    Plan:     Screening mammogram ordered.  Will follow per protocol.

## 2019-01-11 ENCOUNTER — Encounter: Payer: Self-pay | Admitting: *Deleted

## 2019-01-11 NOTE — Progress Notes (Signed)
Letter mailed from the [facility] to inform patient of her normal mammogram results.  Patient is to follow-up with annual screening in one year.  HSIS to Christy. 

## 2019-12-20 ENCOUNTER — Emergency Department
Admission: EM | Admit: 2019-12-20 | Discharge: 2019-12-20 | Disposition: A | Discharge status: Home / Self Care | Payer: Self-pay | Attending: Emergency Medicine | Admitting: Emergency Medicine

## 2019-12-20 ENCOUNTER — Other Ambulatory Visit: Payer: Self-pay

## 2019-12-20 ENCOUNTER — Emergency Department: Payer: Self-pay

## 2019-12-20 ENCOUNTER — Encounter: Payer: Self-pay | Admitting: Emergency Medicine

## 2019-12-20 DIAGNOSIS — W2201XA Walked into wall, initial encounter: Secondary | ICD-10-CM | POA: Insufficient documentation

## 2019-12-20 DIAGNOSIS — F17200 Nicotine dependence, unspecified, uncomplicated: Secondary | ICD-10-CM

## 2019-12-20 DIAGNOSIS — S2231XA Fracture of one rib, right side, initial encounter for closed fracture: Secondary | ICD-10-CM | POA: Insufficient documentation

## 2019-12-20 DIAGNOSIS — F17210 Nicotine dependence, cigarettes, uncomplicated: Secondary | ICD-10-CM | POA: Insufficient documentation

## 2019-12-20 DIAGNOSIS — R918 Other nonspecific abnormal finding of lung field: Secondary | ICD-10-CM | POA: Insufficient documentation

## 2019-12-20 DIAGNOSIS — S2232XA Fracture of one rib, left side, initial encounter for closed fracture: Secondary | ICD-10-CM

## 2019-12-20 MED ORDER — HYDROCODONE-ACETAMINOPHEN 5-325 MG PO TABS: 1.0000 | ORAL_TABLET | Freq: Four times a day (QID) | ORAL | 0 refills | Status: DC | PRN

## 2019-12-20 MED ORDER — AZITHROMYCIN 250 MG PO TABS: ORAL_TABLET | ORAL | 0 refills | Status: DC

## 2019-12-20 NOTE — ED Notes (Signed)
Pt verbalizes understanding of d/c instructions, medications and follow up 
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
## 2019-12-20 NOTE — Discharge Instructions (Signed)
Call make an appointment with your primary care provider if any continued problems and also for follow-up.  Begin taking the hydrocodone every 6 hours as needed for pain.  You may also take ibuprofen with this medication if additional pain medication is needed.  Begin taking the Zithromax for what looks like an early pneumonia.  This antibiotic is once a day.  You may use ice to your ribs as needed for discomfort.  Decrease or discontinue smoking and remember to take several deep breaths per hour using a pillow pressed against her rib for added support.

## 2019-12-20 NOTE — ED Notes (Signed)
Discharge form printed, signed and sent to HIM to be scanned into chart.
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
## 2020-01-30 ENCOUNTER — Ambulatory Visit: Payer: Self-pay | Attending: Oncology

## 2020-01-30 ENCOUNTER — Other Ambulatory Visit: Payer: Self-pay

## 2020-01-30 ENCOUNTER — Ambulatory Visit
Admission: RE | Admit: 2020-01-30 | Discharge: 2020-01-30 | Disposition: A | Discharge status: Home / Self Care | Payer: Self-pay | Source: Ambulatory Visit | Attending: Oncology | Admitting: Oncology

## 2020-01-30 VITALS — BP 99/43 | HR 85 | Temp 99.4°F | Ht 63.0 in | Wt 132.0 lb

## 2020-01-30 DIAGNOSIS — Z0000 Encounter for general adult medical examination without abnormal findings: Secondary | ICD-10-CM | POA: Insufficient documentation

## 2020-01-30 NOTE — Progress Notes (Signed)
  Subjective:     Patient ID: Alexandra [name], female   DOB: [birth year], 49 y.o.   MRN: [id number]  HPI   Review of Systems     Objective:   Physical Exam Chest:     Breasts:        Right: No swelling, bleeding, inverted nipple, mass, nipple discharge, skin change or tenderness.        Left: No swelling, bleeding, inverted nipple, mass, nipple discharge, skin change or tenderness.          Assessment:     49 year old patient returns for BCCCP screening.  Patient has significant family history of breast cancer.  Mother is BRCA negative.   Patient screened, and meets BCCCP eligibility.  Patient does not have insurance, Medicare or Medicaid. Instructed patient on breast self awareness using teach back method.  Clinical breast exam unremarkable.  No mass or lump palpated. Risk Assessment    Risk Scores      01/30/2020 01/09/2019   Last edited by: Rico Junker, RN Theodore Demark, RN   5-year risk: 2 % 2 %   Lifetime risk: 17.6 % 17.9 %        Sent for bilateral screening mammogram.    Plan:     Patient is not interested in speaking to [employer] a this time, but will call back if interested. Sent for bilateral screening mammogram.

## 2020-05-01 ENCOUNTER — Other Ambulatory Visit: Payer: Self-pay

## 2020-05-01 ENCOUNTER — Emergency Department (HOSPITAL_BASED_OUTPATIENT_CLINIC_OR_DEPARTMENT_OTHER): Payer: Self-pay

## 2020-05-01 ENCOUNTER — Emergency Department (HOSPITAL_COMMUNITY)
Admission: EM | Admit: 2020-05-01 | Discharge: 2020-05-01 | Disposition: A | Discharge status: Home / Self Care | Payer: Self-pay | Attending: Emergency Medicine | Admitting: Emergency Medicine

## 2020-05-01 ENCOUNTER — Encounter (HOSPITAL_COMMUNITY): Payer: Self-pay

## 2020-05-01 ENCOUNTER — Emergency Department (HOSPITAL_COMMUNITY): Payer: Self-pay

## 2020-05-01 DIAGNOSIS — M79609 Pain in unspecified limb: Secondary | ICD-10-CM

## 2020-05-01 DIAGNOSIS — M25561 Pain in right knee: Secondary | ICD-10-CM | POA: Insufficient documentation

## 2020-05-01 DIAGNOSIS — F17210 Nicotine dependence, cigarettes, uncomplicated: Secondary | ICD-10-CM | POA: Insufficient documentation

## 2020-05-01 DIAGNOSIS — M25562 Pain in left knee: Secondary | ICD-10-CM | POA: Insufficient documentation

## 2020-05-01 DIAGNOSIS — M25571 Pain in right ankle and joints of right foot: Secondary | ICD-10-CM | POA: Insufficient documentation

## 2020-05-01 DIAGNOSIS — R609 Edema, unspecified: Secondary | ICD-10-CM | POA: Insufficient documentation

## 2020-05-01 DIAGNOSIS — M2550 Pain in unspecified joint: Secondary | ICD-10-CM

## 2020-05-01 DIAGNOSIS — M25572 Pain in left ankle and joints of left foot: Secondary | ICD-10-CM | POA: Insufficient documentation

## 2020-05-01 LAB — CK: Total CK: 42 U/L (ref 38–234)

## 2020-05-01 LAB — COMPREHENSIVE METABOLIC PANEL
ALT: 12 U/L (ref 0–44)
AST: 15 U/L (ref 15–41)
Albumin: 2.6 g/dL — ABNORMAL LOW (ref 3.5–5.0)
Alkaline Phosphatase: 81 U/L (ref 38–126)
Anion gap: 12 (ref 5–15)
BUN: 17 mg/dL (ref 6–20)
CO2: 23 mmol/L (ref 22–32)
Calcium: 8.9 mg/dL (ref 8.9–10.3)
Chloride: 102 mmol/L (ref 98–111)
Creatinine, Ser: 0.59 mg/dL (ref 0.44–1.00)
GFR, Estimated: >60 mL/min (ref >60)
Glucose, Bld: 132 mg/dL — ABNORMAL HIGH (ref 70–99)
Potassium: 4.2 mmol/L (ref 3.5–5.1)
Sodium: 137 mmol/L (ref 135–145)
Total Bilirubin: 0.4 mg/dL (ref 0.3–1.2)
Total Protein: 6.7 g/dL (ref 6.5–8.1)

## 2020-05-01 LAB — CBC WITH DIFFERENTIAL/PLATELET
Abs Immature Granulocytes: 0.03 10*3/uL (ref 0.00–0.07)
Basophils Absolute: 0.1 10*3/uL (ref 0.0–0.1)
Basophils Relative: 1 %
Eosinophils Absolute: 0.7 10*3/uL — ABNORMAL HIGH (ref 0.0–0.5)
Eosinophils Relative: 8 %
HCT: 33.9 % — ABNORMAL LOW (ref 36.0–46.0)
Hemoglobin: 10.7 g/dL — ABNORMAL LOW (ref 12.0–15.0)
Immature Granulocytes: 0 %
Lymphocytes Relative: 17 %
Lymphs Abs: 1.7 10*3/uL (ref 0.7–4.0)
MCH: 26.1 pg (ref 26.0–34.0)
MCHC: 31.6 g/dL (ref 30.0–36.0)
MCV: 82.7 fL (ref 80.0–100.0)
Monocytes Absolute: 0.7 10*3/uL (ref 0.1–1.0)
Monocytes Relative: 7 %
Neutro Abs: 6.6 10*3/uL (ref 1.7–7.7)
Neutrophils Relative %: 67 %
Platelets: 672 10*3/uL — ABNORMAL HIGH (ref 150–400)
RBC: 4.1 MIL/uL (ref 3.87–5.11)
RDW: 15.2 % (ref 11.5–15.5)
WBC: 9.8 10*3/uL (ref 4.0–10.5)
nRBC: 0 % (ref 0.0–0.2)

## 2020-05-01 LAB — URINALYSIS, ROUTINE W REFLEX MICROSCOPIC
Bilirubin Urine: NEGATIVE
Glucose, UA: NEGATIVE mg/dL
Hgb urine dipstick: NEGATIVE
Ketones, ur: NEGATIVE mg/dL
Leukocytes,Ua: NEGATIVE
Nitrite: NEGATIVE
Protein, ur: NEGATIVE mg/dL
Specific Gravity, Urine: 1.02 (ref 1.005–1.030)
pH: 5 (ref 5.0–8.0)

## 2020-05-01 LAB — I-STAT BETA HCG BLOOD, ED (MC, WL, AP ONLY): I-stat hCG, quantitative: <5 m[IU]/mL (ref <5)

## 2020-05-01 LAB — SEDIMENTATION RATE: Sed Rate: 90 mm/hr — ABNORMAL HIGH (ref 0–22)

## 2020-05-01 LAB — LACTIC ACID, PLASMA: Lactic Acid, Venous: 1.1 mmol/L (ref 0.5–1.9)

## 2020-05-01 MED ORDER — ETODOLAC 300 MG PO CAPS: 300.0000 mg | ORAL_CAPSULE | Freq: Three times a day (TID) | ORAL | 0 refills | Status: DC

## 2020-05-01 MED ORDER — PREDNISONE 20 MG PO TABS: 20.0000 mg | ORAL_TABLET | Freq: Two times a day (BID) | ORAL | 0 refills | Status: AC

## 2020-05-01 NOTE — Progress Notes (Signed)
VASCULAR LAB    Bilateral lower extremity venous duplex has been performed.  See CV proc for preliminary results.  Messaged results to Dr. Lynelle Doctor via secure chat.   Arvella Massingale, RVT 05/01/2020, 10:04 AM

## 2020-05-01 NOTE — ED Provider Notes (Signed)
[facility] EMERGENCY DEPARTMENT Provider Note   CSN: [id number] Arrival date & time: 05/01/20  7322     History Chief Complaint  Patient presents with  . Leg Swelling  . Rash  . Fever    Alexandra [name] is a 50 y.o. female.  HPI   Pt has ben having trouble with aching and pain in her lower legs for the last month.  Patient states the pain is in her joints primarily in her lower legs.  Involves her knees and her ankles.  It is very painful and it hurts for her to walk around.  Patient states she has had some fevers as well intermittently up to 101.  Patient also started to notice rash development in her legs in the same areas of the swelling and tenderness.  Pt has been diagnosed with rheumatoid arthritis when she went to [facility] ED.  Patient states she does not agree with that diagnosis.  She does not think rheumatoid arthritis could hurt this much.  Patient also states she was told to follow-up with her doctor but does not think her doctor will be able to do anything for her.  Patient has been googling different types of illnesses and thinks that something else is going on.  I was able to review the records from [facility].  Patient did have laboratory testing.  She did not have specific rheumatological tests.  The provider was concerned for the possibility of rheumatoid arthritis.  She was referred to the rheumatology clinic at [facility].  Patient states she has not heard back from them.     Past Medical History:  Diagnosis Date  . Breast discharge 2013   present X 3 years  Left only multiple ducts per pt  . Depression     Patient Active Problem List   Diagnosis Date Noted  . Radiculopathy affecting upper extremity 02/16/2016  . Neck pain 02/16/2016  . Protrusion of cervical intervertebral disc 02/16/2016  . Depression 02/16/2016  . 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 Tobacco use disorder 02/09/2016    Past  Surgical History:  Procedure Laterality Date  . ABDOMINAL HYSTERECTOMY    . BREAST CYST EXCISION Left 2006     OB History   No obstetric history on file.     Family History  Problem Relation Age of Onset  . Breast cancer Mother 4  . Breast cancer Maternal Aunt   . Breast cancer Maternal Grandmother   . Breast cancer Maternal Aunt        Maternal Great Aunt   . Breast cancer Cousin 88    Social History   Tobacco Use  . Smoking status: Current Every Day Smoker    Packs/day: 1.00    Years: 29.00    Pack years: 29.00    Types: Cigarettes  . Smokeless tobacco: Never Used  Substance Use Topics  . Alcohol use: Yes    Alcohol/week: 0.0 standard drinks    Comment: social  . Drug use: No    Home Medications Prior to Admission medications   Medication Sig Start Date End Date Taking? Authorizing Provider  etodolac (LODINE) 300 MG capsule Take 1 capsule (300 mg total) by mouth 3 (three) times daily. 05/01/20  Yes Linwood Dibbles, MD  predniSONE (DELTASONE) 20 MG tablet Take 1 tablet (20 mg total) by mouth in the morning and at bedtime for 10 days. 05/01/20 05/11/20 Yes Linwood Dibbles, MD  azithromycin (ZITHROMAX Z-PAK)  250 MG tablet Take 2 tablets (500 mg) on  Day 1,  followed by 1 tablet (250 mg) once daily on Days 2 through 5. 12/20/19   Tommi RumpsSummers, Rhonda L, PA-C  citalopram (CELEXA) 40 MG tablet Take 40 mg by mouth daily.    [provider]  HYDROcodone-acetaminophen (NORCO/VICODIN) 5-325 MG tablet Take 1 tablet by mouth every 6 (six) hours as needed for moderate pain. 12/20/19   Tommi RumpsSummers, Rhonda L, PA-C  ibuprofen (ADVIL,MOTRIN) 800 MG tablet Take 1 tablet (800 mg total) by mouth every 8 (eight) hours as needed. 03/26/17   Rebecka ApleyWebster, Allison P, MD  albuterol (PROVENTIL HFA;VENTOLIN HFA) 108 (90 Base) MCG/ACT inhaler Inhale 2 puffs into the lungs every 6 (six) hours as needed. 03/26/17 12/20/19  Rebecka ApleyWebster, Allison P, MD    Allergies    Patient has no known allergies.  Review of Systems    Review of Systems  All other systems reviewed and are negative.   Physical Exam Updated Vital Signs BP 92/66   Pulse 91   Temp 98.2 F (36.8 C) (Oral)   Resp 13   Ht 1.6 m (5\' 3" )   Wt 59.9 kg   LMP 01/14/2009   SpO2 97%   BMI 23.39 kg/m   Physical Exam Vitals and nursing note reviewed.  Constitutional:      Appearance: She is well-developed and well-nourished. She is not ill-appearing or diaphoretic.  HENT:     Head: Normocephalic and atraumatic.     Right Ear: External ear normal.     Left Ear: External ear normal.  Eyes:     General: No scleral icterus.       Right eye: No discharge.        Left eye: No discharge.     Conjunctiva/sclera: Conjunctivae normal.  Neck:     Trachea: No tracheal deviation.  Cardiovascular:     Rate and Rhythm: Normal rate and regular rhythm.     Pulses: Intact distal pulses.  Pulmonary:     Effort: Pulmonary effort is normal. No respiratory distress.     Breath sounds: Normal breath sounds. No stridor. No wheezing or rales.  Abdominal:     General: Bowel sounds are normal. There is no distension.     Palpations: Abdomen is soft.     Tenderness: There is no abdominal tenderness. There is no guarding or rebound.  Musculoskeletal:        General: Tenderness present.     Cervical back: Neck supple.     Right lower leg: Edema present.     Left lower leg: Edema present.     Comments: Tenderness palpation bilateral knees and ankles  Skin:    General: Skin is warm and dry.     Findings: No rash.     Comments: Mild erythematous rash of the lower extremity  Neurological:     Mental Status: She is alert.     Cranial Nerves: No cranial nerve deficit (no facial droop, extraocular movements intact, no slurred speech).     Sensory: No sensory deficit.     Motor: No abnormal muscle tone or seizure activity.     Coordination: Coordination normal.     Deep Tendon Reflexes: Strength normal.  Psychiatric:        Mood and Affect: Mood and  affect normal.     ED Results / Procedures / Treatments   Labs (all labs ordered are listed, but only abnormal results are displayed) Labs Reviewed  COMPREHENSIVE METABOLIC PANEL -  Abnormal; Notable for the following components:      Result Value   Glucose, Bld 132 (*)    Albumin 2.6 (*)    All other components within normal limits  CBC WITH DIFFERENTIAL/PLATELET - Abnormal; Notable for the following components:   Hemoglobin 10.7 (*)    HCT 33.9 (*)    Platelets 672 (*)    Eosinophils Absolute 0.7 (*)    All other components within normal limits  URINALYSIS, ROUTINE W REFLEX MICROSCOPIC - Abnormal; Notable for the following components:   APPearance HAZY (*)    All other components within normal limits  SEDIMENTATION RATE - Abnormal; Notable for the following components:   Sed Rate 90 (*)    All other components within normal limits  LACTIC ACID, PLASMA  CK  I-STAT BETA HCG BLOOD, ED ([facility], [facility], [facility] ONLY)    EKG None  Radiology DG Ankle Complete Left  Result Date: 05/01/2020 CLINICAL DATA:  Bilateral ankle and knee pain.  No known injury. EXAM: LEFT ANKLE COMPLETE - 3+ VIEW COMPARISON:  None. FINDINGS: There is no evidence of fracture, dislocation, or joint effusion. There is no evidence of arthropathy or other focal bone abnormality. Soft tissues are unremarkable. IMPRESSION: Negative. Electronically Signed   By: Charlett Nose M.D.   On: 05/01/2020 10:10   DG Ankle Complete Right  Result Date: 05/01/2020 CLINICAL DATA:  Atraumatic pain EXAM: RIGHT ANKLE - COMPLETE 3+ VIEW COMPARISON:  None. FINDINGS: There is no evidence of fracture, dislocation, or joint effusion. There is no evidence of arthropathy or other focal bone abnormality. Soft tissues are unremarkable. IMPRESSION: Negative. Electronically Signed   By: Charlett Nose M.D.   On: 05/01/2020 10:11   DG Knee Complete 4 Views Left  Result Date: 05/01/2020 CLINICAL DATA:  Atraumatic pain EXAM: LEFT KNEE - COMPLETE 4+ VIEW  COMPARISON:  None. FINDINGS: No evidence of fracture, dislocation, or joint effusion. No evidence of arthropathy or other focal bone abnormality. Soft tissues are unremarkable. IMPRESSION: Negative. Electronically Signed   By: Charlett Nose M.D.   On: 05/01/2020 10:11   DG Knee Complete 4 Views Right  Result Date: 05/01/2020 CLINICAL DATA:  Atraumatic pain EXAM: RIGHT KNEE - COMPLETE 4+ VIEW COMPARISON:  None. FINDINGS: No evidence of fracture, dislocation, or joint effusion. No evidence of arthropathy or other focal bone abnormality. Soft tissues are unremarkable. IMPRESSION: Negative. Electronically Signed   By: Charlett Nose M.D.   On: 05/01/2020 10:12   VAS [place] LOWER EXTREMITY VENOUS (DVT) (ONLY [facility] & [facility] 7a-7p)  Result Date: 05/01/2020  Lower Venous DVT Study Indications: Pain.  Risk Factors: New diagnosis of probable Rheumatoid arthritis. Comparison Study: No prior study on file Performing Technologist: Sherren Kerns RVS  Examination Guidelines: A complete evaluation includes B-mode imaging, spectral Doppler, color Doppler, and power Doppler as needed of all accessible portions of each vessel. Bilateral testing is considered an integral part of a complete examination. Limited examinations for reoccurring indications may be performed as noted. The reflux portion of the exam is performed with the patient in reverse Trendelenburg.  +---------+---------------+---------+-----------+----------+--------------+ RIGHT    CompressibilityPhasicitySpontaneityPropertiesThrombus Aging +---------+---------------+---------+-----------+----------+--------------+ CFV      Full           Yes      Yes                                 +---------+---------------+---------+-----------+----------+--------------+ SFJ  Full                                                        +---------+---------------+---------+-----------+----------+--------------+ FV Prox  Full                                                         +---------+---------------+---------+-----------+----------+--------------+ FV Mid   Full                                                        +---------+---------------+---------+-----------+----------+--------------+ FV DistalFull                                                        +---------+---------------+---------+-----------+----------+--------------+ PFV      Full                                                        +---------+---------------+---------+-----------+----------+--------------+ POP      Full           Yes      Yes                                 +---------+---------------+---------+-----------+----------+--------------+ PTV      Full                                                        +---------+---------------+---------+-----------+----------+--------------+ PERO     Full                                                        +---------+---------------+---------+-----------+----------+--------------+   +---------+---------------+---------+-----------+----------+--------------+ LEFT     CompressibilityPhasicitySpontaneityPropertiesThrombus Aging +---------+---------------+---------+-----------+----------+--------------+ CFV      Full           Yes      Yes                                 +---------+---------------+---------+-----------+----------+--------------+ SFJ      Full                                                        +---------+---------------+---------+-----------+----------+--------------+  FV Prox  Full                                                        +---------+---------------+---------+-----------+----------+--------------+ FV Mid   Full                                                        +---------+---------------+---------+-----------+----------+--------------+ FV DistalFull                                                         +---------+---------------+---------+-----------+----------+--------------+ PFV      Full                                                        +---------+---------------+---------+-----------+----------+--------------+ POP      Full           Yes      Yes                                 +---------+---------------+---------+-----------+----------+--------------+ PTV      Full                                                        +---------+---------------+---------+-----------+----------+--------------+ PERO     Full                                                        +---------+---------------+---------+-----------+----------+--------------+     Summary: BILATERAL: - No evidence of deep vein thrombosis seen in the lower extremities, bilaterally. -No evidence of popliteal cyst, bilaterally.   *See table(s) above for measurements and observations.    Preliminary     Procedures Procedures   Medications Ordered in ED Medications - No data to display  ED Course  I have reviewed the triage vital signs and the nursing notes.  Pertinent labs & imaging results that were available during my care of the patient were reviewed by me and considered in my medical decision making (see chart for details).  Clinical Course as of 05/01/20 1305  Thu May 01, 2020  1038 Doppler study is negative for DVT. [JK]  1038 X-rays without acute findings [JK]  1038 Urinalysis without signs of infection.  CBC and metabolic panel unremarkable. [JK]  1224 Patient CK is not elevated.  Sed rate is still in process. [JK]    Clinical Course User Index [JK] Linwood Dibbles, MD   MDM Rules/Calculators/A&P  Patient presents with complaints of joint and leg pain and swelling.  Inflammatory arthritis is concerning.  Patient is also concerned possibility of DVT with the increasing swelling.  We will proceed with laboratory tests.  We will also proceed with vascular ultrasound  imaging  Patient's Doppler study does not show evidence of DVT.  Patient'sCK is normal.  No findings to suggest rhabdomyolysis or myositis.  Patient sed rate is elevated.  I think her presentation is consistent with an inflammatory arthritis.  I doubt infection.  Patient would benefit from further evaluation with a rheumatologist as an outpatient.  Patient mention some improvement with the steroids initially so we will give her another dose of steroids and NSAIDs.  I provided the name of a rheumatologist group here in [place].  Patient lives in [place] so she asked for a possible rheumatologist there.  I am not familiar with the [place] area but the [place] clinics website indicate they do have rheumatologist.  I recommended she also try contacting them. Final Clinical Impression(s) / ED Diagnoses Final diagnoses:  Arthralgia, unspecified joint    Rx / DC Orders ED Discharge Orders         Ordered    predniSONE (DELTASONE) 20 MG tablet  2 times daily        05/01/20 1255    etodolac (LODINE) 300 MG capsule  3 times daily       Note to Pharmacy: As needed for pain   05/01/20 1255           Linwood Dibbles, MD 05/01/20 1305

## 2020-05-01 NOTE — Discharge Instructions (Addendum)
[facility] in [place] may also have a rheumatologist.  Take the medications to help with the pain and inflammation.  Follow-up with your rheumatologist for further evaluation as we discussed.

## 2020-05-01 NOTE — ED Notes (Signed)
EDP at bedside  

## 2020-05-01 NOTE — ED Triage Notes (Signed)
Patient reports leg, ankle, and foot swelling x 1 month, associated with rash and bruising to ankles and feet, was seen at [facility] about 2 weeks and was dx with RA and sent home with prednisone, reports it did not help with swelling. Patient reports she does not think it is RA and wants to be worked up for inflammatory process.

## 2020-05-04 NOTE — Progress Notes (Signed)
Letter mailed from [facility] to notify of normal mammogram results.  Patient to return in one year for annual screening.  Copy to [facility]. 

## 2020-06-30 ENCOUNTER — Emergency Department
Admission: EM | Admit: 2020-06-30 | Discharge: 2020-06-30 | Disposition: A | Discharge status: Home / Self Care | Payer: Self-pay | Attending: Emergency Medicine | Admitting: Emergency Medicine

## 2020-06-30 ENCOUNTER — Other Ambulatory Visit: Payer: Self-pay

## 2020-06-30 ENCOUNTER — Encounter: Payer: Self-pay | Admitting: Emergency Medicine

## 2020-06-30 ENCOUNTER — Emergency Department: Payer: Self-pay

## 2020-06-30 DIAGNOSIS — F17210 Nicotine dependence, cigarettes, uncomplicated: Secondary | ICD-10-CM | POA: Insufficient documentation

## 2020-06-30 DIAGNOSIS — Z79899 Other long term (current) drug therapy: Secondary | ICD-10-CM | POA: Insufficient documentation

## 2020-06-30 DIAGNOSIS — L539 Erythematous condition, unspecified: Secondary | ICD-10-CM | POA: Insufficient documentation

## 2020-06-30 DIAGNOSIS — M25571 Pain in right ankle and joints of right foot: Secondary | ICD-10-CM | POA: Insufficient documentation

## 2020-06-30 LAB — CBC WITH DIFFERENTIAL/PLATELET
Abs Immature Granulocytes: 0.04 10*3/uL (ref 0.00–0.07)
Basophils Absolute: 0.1 10*3/uL (ref 0.0–0.1)
Basophils Relative: 1 %
Eosinophils Absolute: 0.5 10*3/uL (ref 0.0–0.5)
Eosinophils Relative: 6 %
HCT: 33.7 % — ABNORMAL LOW (ref 36.0–46.0)
Hemoglobin: 10.3 g/dL — ABNORMAL LOW (ref 12.0–15.0)
Immature Granulocytes: 0 %
Lymphocytes Relative: 19 %
Lymphs Abs: 1.8 10*3/uL (ref 0.7–4.0)
MCH: 23.7 pg — ABNORMAL LOW (ref 26.0–34.0)
MCHC: 30.6 g/dL (ref 30.0–36.0)
MCV: 77.5 fL — ABNORMAL LOW (ref 80.0–100.0)
Monocytes Absolute: 0.6 10*3/uL (ref 0.1–1.0)
Monocytes Relative: 7 %
Neutro Abs: 6.3 10*3/uL (ref 1.7–7.7)
Neutrophils Relative %: 67 %
Platelets: 619 10*3/uL — ABNORMAL HIGH (ref 150–400)
RBC: 4.35 MIL/uL (ref 3.87–5.11)
RDW: 18.3 % — ABNORMAL HIGH (ref 11.5–15.5)
WBC: 9.4 10*3/uL (ref 4.0–10.5)
nRBC: 0 % (ref 0.0–0.2)

## 2020-06-30 LAB — COMPREHENSIVE METABOLIC PANEL
ALT: 16 U/L (ref 0–44)
AST: 23 U/L (ref 15–41)
Albumin: 2.8 g/dL — ABNORMAL LOW (ref 3.5–5.0)
Alkaline Phosphatase: 73 U/L (ref 38–126)
Anion gap: 10 (ref 5–15)
BUN: 15 mg/dL (ref 6–20)
CO2: 24 mmol/L (ref 22–32)
Calcium: 8.3 mg/dL — ABNORMAL LOW (ref 8.9–10.3)
Chloride: 102 mmol/L (ref 98–111)
Creatinine, Ser: 0.59 mg/dL (ref 0.44–1.00)
GFR, Estimated: >60 mL/min (ref >60)
Glucose, Bld: 98 mg/dL (ref 70–99)
Potassium: 4.4 mmol/L (ref 3.5–5.1)
Sodium: 136 mmol/L (ref 135–145)
Total Bilirubin: 0.9 mg/dL (ref 0.3–1.2)
Total Protein: 7.2 g/dL (ref 6.5–8.1)

## 2020-06-30 LAB — SEDIMENTATION RATE: Sed Rate: 78 mm/hr — ABNORMAL HIGH (ref 0–30)

## 2020-06-30 MED ORDER — PREDNISONE 10 MG PO TABS: ORAL_TABLET | ORAL | 0 refills | Status: AC

## 2020-06-30 MED ORDER — PREDNISONE 20 MG PO TABS
60.0000 mg | ORAL_TABLET | Freq: Once | ORAL | Status: AC
  Administered 2020-06-30: 60 mg via ORAL
  Filled 2020-06-30: qty 3

## 2020-06-30 MED ORDER — NAPROXEN 500 MG PO TABS
500.0000 mg | ORAL_TABLET | Freq: Once | ORAL | Status: AC
  Administered 2020-06-30: 500 mg via ORAL
  Filled 2020-06-30: qty 1

## 2020-06-30 NOTE — ED Triage Notes (Signed)
Presents with right ankle pain w/o injury  States was told she had RA  Had been on steroids  No swelling   discoloration noted  Around ankle

## 2020-06-30 NOTE — Discharge Instructions (Signed)
Use Naproxen/Aleve for pain/inflammation.   Take Prednisone taper as prescribed over the next 2 weeks.   Follow up with a local Dermatologist.  If you develop fevers, swelling of the ankle joint, pain with moving the ankle joint, return to the ED.

## 2020-06-30 NOTE — ED Provider Notes (Signed)
[facility] Emergency Department Provider Note  ____________________________________________   Event Date/Time   First MD Initiated Contact with Patient 06/30/20 0831     (approximate)  I have reviewed the triage vital signs and the nursing notes.   HISTORY  Chief Complaint Ankle Pain   HPI Alexandra [name] is a 50 y.o. female presents to the ED with complaint of right ankle pain without history of injury.  Patient states that she was seen at [facility] and was told that she had rheumatoid arthritis without having lab work done.  She states she was on steroids for period of time.  She denies any swelling of her ankles or other joints.  Also describes her right ankle as being a burning sensation.  She continues to ambulate without any assistance.         Past Medical History:  Diagnosis Date  . Breast discharge 2013   present X 3 years  Left only multiple ducts per pt  . Depression     Patient Active Problem List   Diagnosis Date Noted  . Radiculopathy affecting upper extremity 02/16/2016  . Neck pain 02/16/2016  . Protrusion of cervical intervertebral disc 02/16/2016  . Depression 02/16/2016  . 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 Tobacco use disorder 02/09/2016    Past Surgical History:  Procedure Laterality Date  . ABDOMINAL HYSTERECTOMY    . BREAST CYST EXCISION Left 2006    Prior to Admission medications   Medication Sig Start Date End Date Taking? Authorizing Provider  ARIPiprazole (ABILIFY) 2 MG tablet Take 2 mg by mouth daily.   Yes [provider]  buPROPion (WELLBUTRIN) 100 MG tablet Take 100 mg by mouth 2 (two) times daily.   Yes [provider]  predniSONE (DELTASONE) 10 MG tablet Take 5 tablets (50 mg total) by mouth daily for 6 days, THEN 4 tablets (40 mg total) daily for 2 days, THEN 3 tablets (30 mg total) daily for 2 days, THEN 2 tablets (20 mg total) daily for 2  days, THEN 1 tablet (10 mg total) daily for 2 days. 06/30/20 07/14/20 Yes Delton Prairie, MD  albuterol (PROVENTIL HFA;VENTOLIN HFA) 108 (90 Base) MCG/ACT inhaler Inhale 2 puffs into the lungs every 6 (six) hours as needed. 03/26/17 12/20/19  Rebecka Apley, MD    Allergies Patient has no known allergies.  Family History  Problem Relation Age of Onset  . Breast cancer Mother 45  . Breast cancer Maternal Aunt   . Breast cancer Maternal Grandmother   . Breast cancer Maternal Aunt        Maternal Great Aunt   . Breast cancer Cousin 52    Social History Social History   Tobacco Use  . Smoking status: Current Every Day Smoker    Packs/day: 1.00    Years: 29.00    Pack years: 29.00    Types: Cigarettes  . Smokeless tobacco: Never Used  Substance Use Topics  . Alcohol use: Yes    Alcohol/week: 0.0 standard drinks    Comment: social  . Drug use: No    Review of Systems Constitutional: No fever/chills Eyes: No visual changes. ENT: No complaints. Cardiovascular: Denies chest pain. Respiratory: Denies shortness of breath. Gastrointestinal: No abdominal pain.  No nausea, no vomiting.   Genitourinary: Negative for dysuria. Musculoskeletal: Positive right ankle pain. Skin: Positive for rash bilateral ankles. Neurological: Negative for headaches, focal weakness or numbness. ____________________________________________   PHYSICAL EXAM:  VITAL SIGNS: ED Triage Vitals  Enc Vitals Group     BP      Pulse      Resp      Temp      Temp src      SpO2      Weight      Height      Head Circumference      Peak Flow      Pain Score      Pain Loc      Pain Edu?      Excl. in GC?     Constitutional: Alert and oriented. Well appearing and in no acute distress. Eyes: Conjunctivae are normal.  Head: Atraumatic. Neck: No stridor.   Cardiovascular: Normal rate, regular rhythm. Grossly normal heart sounds.  Good peripheral circulation. Respiratory: Normal respiratory effort.  No  retractions. Lungs CTAB. Gastrointestinal: Soft and nontender. No distention.  Musculoskeletal: Examination of the right shoulder there is no gross deformity and no soft tissue edema present.  There is a erythematous purpura type lesion in a circular pattern to the ankle but is also seen on the left ankle as well.  No warmth is noted on palpation of the rash itself.  Patient is tender to palpation in this area on the right ankle but not on the left.  Both DP and TP are intact.  Motor sensory function intact.  Capillary refills less than 3 seconds.  Skin lesions are localized to the ankle itself.  Patient is ambulatory without any assistance. Neurologic:  Normal speech and language. No gross focal neurologic deficits are appreciated.  Skin:  Skin is warm, dry and intact. No rash noted. Psychiatric: Mood and affect are normal. Speech and behavior are normal.  ____________________________________________   LABS (all labs ordered are listed, but only abnormal results are displayed)  Labs Reviewed  CBC WITH DIFFERENTIAL/PLATELET - Abnormal; Notable for the following components:      Result Value   Hemoglobin 10.3 (*)    HCT 33.7 (*)    MCV 77.5 (*)    MCH 23.7 (*)    RDW 18.3 (*)    Platelets 619 (*)    All other components within normal limits  COMPREHENSIVE METABOLIC PANEL - Abnormal; Notable for the following components:   Calcium 8.3 (*)    Albumin 2.8 (*)    All other components within normal limits  SEDIMENTATION RATE - Abnormal; Notable for the following components:   Sed Rate 78 (*)    All other components within normal limits    RADIOLOGY I, Tommi Rumps, personally viewed and evaluated these images (plain radiographs) as part of my medical decision making, as well as reviewing the written report by the radiologist.   Official radiology report(s): DG Ankle Complete Right  Result Date: 06/30/2020 CLINICAL DATA:  Pain without injury. EXAM: RIGHT ANKLE - COMPLETE 3+ VIEW  COMPARISON:  May 01, 2020. FINDINGS: There is no evidence of fracture, dislocation, or joint effusion. There is no evidence of arthropathy or other focal bone abnormality. Calcaneal enthesophyte. Soft tissues are unremarkable. IMPRESSION: Negative. Electronically Signed   By: Feliberto Harts MD   On: 06/30/2020 09:14    ____________________________________________   PROCEDURES  Procedure(s) performed (including Critical Care):  Procedures   ____________________________________________   INITIAL IMPRESSION / ASSESSMENT AND PLAN / ED COURSE  As part of my medical decision making, I reviewed the following data within the electronic MEDICAL RECORD NUMBER Notes from prior ED visits and [place]  Controlled Substance Database  50 year old female presents to the ED with complaint of right ankle pain/burning sensation.  It was noted that she has a erythematous/purpura type rash to her ankles bilaterally.  There was no warmth or edema to suggest cellulitis.  X-rays were negative.  In looking back through her records she was told that she was being treated for rheumatoid arthritis with steroids.  She did not have a rheumatoid work-up at that time.  Dr. Delton Prairie was in to see the patient as well and felt that this was a dermatology issue rather than an orthopedic issue and x-ray was reassuring.  Sed rate was elevated at 78.  Patient was given a prescription for naproxen 500 mg and prednisone tapering dose.  She was given a dermatology referral to evaluate the rash that she states is burning and stinging.  Patient was ambulatory at the time of her discharge.  ____________________________________________   FINAL CLINICAL IMPRESSION(S) / ED DIAGNOSES  Final diagnoses:  Acute right ankle pain     ED Discharge Orders         Ordered    predniSONE (DELTASONE) 10 MG tablet        06/30/20 1036          *Please note:  Alexandra [name] was evaluated in Emergency Department on 06/30/2020 for the  symptoms described in the history of present illness. She was evaluated in the context of the global COVID-19 pandemic, which necessitated consideration that the patient might be at risk for infection with the SARS-CoV-2 virus that causes COVID-19. Institutional protocols and algorithms that pertain to the evaluation of patients at risk for COVID-19 are in a state of rapid change based on information released by regulatory bodies including the CDC and federal and state organizations. These policies and algorithms were followed during the patient's care in the ED.  Some ED evaluations and interventions may be delayed as a result of limited staffing during and the pandemic.*   Note:  This document was prepared using Dragon voice recognition software and may include unintentional dictation errors.    Tommi Rumps, PA-C 06/30/20 1607    Delton Prairie, MD 06/30/20 [phone]

## 2020-07-26 ENCOUNTER — Other Ambulatory Visit: Payer: Self-pay

## 2020-07-26 ENCOUNTER — Emergency Department: Payer: Medicaid Other

## 2020-07-26 ENCOUNTER — Inpatient Hospital Stay
Admission: EM | Admit: 2020-07-26 | Discharge: 2020-07-30 | DRG: 603 | Disposition: A | Discharge status: Home / Self Care | Payer: Medicaid Other | Attending: Internal Medicine | Admitting: Internal Medicine

## 2020-07-26 DIAGNOSIS — I739 Peripheral vascular disease, unspecified: Secondary | ICD-10-CM

## 2020-07-26 DIAGNOSIS — M79671 Pain in right foot: Secondary | ICD-10-CM | POA: Diagnosis present

## 2020-07-26 DIAGNOSIS — R21 Rash and other nonspecific skin eruption: Secondary | ICD-10-CM | POA: Diagnosis present

## 2020-07-26 DIAGNOSIS — K219 Gastro-esophageal reflux disease without esophagitis: Secondary | ICD-10-CM | POA: Diagnosis present

## 2020-07-26 DIAGNOSIS — L0100 Impetigo, unspecified: Secondary | ICD-10-CM | POA: Diagnosis present

## 2020-07-26 DIAGNOSIS — L03115 Cellulitis of right lower limb: Principal | ICD-10-CM | POA: Diagnosis present

## 2020-07-26 DIAGNOSIS — M79672 Pain in left foot: Secondary | ICD-10-CM | POA: Diagnosis present

## 2020-07-26 DIAGNOSIS — F17210 Nicotine dependence, cigarettes, uncomplicated: Secondary | ICD-10-CM | POA: Diagnosis present

## 2020-07-26 DIAGNOSIS — G629 Polyneuropathy, unspecified: Secondary | ICD-10-CM | POA: Diagnosis present

## 2020-07-26 DIAGNOSIS — Z79899 Other long term (current) drug therapy: Secondary | ICD-10-CM

## 2020-07-26 DIAGNOSIS — Z20822 Contact with and (suspected) exposure to covid-19: Secondary | ICD-10-CM | POA: Diagnosis present

## 2020-07-26 DIAGNOSIS — D75839 Thrombocytosis, unspecified: Secondary | ICD-10-CM | POA: Diagnosis not present

## 2020-07-26 DIAGNOSIS — Z803 Family history of malignant neoplasm of breast: Secondary | ICD-10-CM

## 2020-07-26 DIAGNOSIS — Z720 Tobacco use: Secondary | ICD-10-CM | POA: Diagnosis present

## 2020-07-26 DIAGNOSIS — L0390 Cellulitis, unspecified: Secondary | ICD-10-CM | POA: Diagnosis present

## 2020-07-26 DIAGNOSIS — L989 Disorder of the skin and subcutaneous tissue, unspecified: Secondary | ICD-10-CM

## 2020-07-26 DIAGNOSIS — D509 Iron deficiency anemia, unspecified: Secondary | ICD-10-CM | POA: Diagnosis present

## 2020-07-26 DIAGNOSIS — F32A Depression, unspecified: Secondary | ICD-10-CM | POA: Diagnosis present

## 2020-07-26 DIAGNOSIS — M25571 Pain in right ankle and joints of right foot: Secondary | ICD-10-CM | POA: Diagnosis present

## 2020-07-26 DIAGNOSIS — F17200 Nicotine dependence, unspecified, uncomplicated: Secondary | ICD-10-CM | POA: Diagnosis present

## 2020-07-26 LAB — CBC WITH DIFFERENTIAL/PLATELET
Abs Immature Granulocytes: 0.05 10*3/uL (ref 0.00–0.07)
Basophils Absolute: 0.1 10*3/uL (ref 0.0–0.1)
Basophils Relative: 1 %
Eosinophils Absolute: 0.6 10*3/uL — ABNORMAL HIGH (ref 0.0–0.5)
Eosinophils Relative: 5 %
HCT: 35.8 % — ABNORMAL LOW (ref 36.0–46.0)
Hemoglobin: 10.9 g/dL — ABNORMAL LOW (ref 12.0–15.0)
Immature Granulocytes: 0 %
Lymphocytes Relative: 24 %
Lymphs Abs: 2.9 10*3/uL (ref 0.7–4.0)
MCH: 23.5 pg — ABNORMAL LOW (ref 26.0–34.0)
MCHC: 30.4 g/dL (ref 30.0–36.0)
MCV: 77.2 fL — ABNORMAL LOW (ref 80.0–100.0)
Monocytes Absolute: 1 10*3/uL (ref 0.1–1.0)
Monocytes Relative: 9 %
Neutro Abs: 7.4 10*3/uL (ref 1.7–7.7)
Neutrophils Relative %: 61 %
Platelets: 617 10*3/uL — ABNORMAL HIGH (ref 150–400)
RBC: 4.64 MIL/uL (ref 3.87–5.11)
RDW: 19 % — ABNORMAL HIGH (ref 11.5–15.5)
WBC: 12.1 10*3/uL — ABNORMAL HIGH (ref 4.0–10.5)
nRBC: 0 % (ref 0.0–0.2)

## 2020-07-26 LAB — COMPREHENSIVE METABOLIC PANEL
ALT: 12 U/L (ref 0–44)
AST: 14 U/L — ABNORMAL LOW (ref 15–41)
Albumin: 3.2 g/dL — ABNORMAL LOW (ref 3.5–5.0)
Alkaline Phosphatase: 71 U/L (ref 38–126)
Anion gap: 12 (ref 5–15)
BUN: 16 mg/dL (ref 6–20)
CO2: 25 mmol/L (ref 22–32)
Calcium: 9.1 mg/dL (ref 8.9–10.3)
Chloride: 102 mmol/L (ref 98–111)
Creatinine, Ser: 0.7 mg/dL (ref 0.44–1.00)
GFR, Estimated: >60 mL/min (ref >60)
Glucose, Bld: 88 mg/dL (ref 70–99)
Potassium: 4.3 mmol/L (ref 3.5–5.1)
Sodium: 139 mmol/L (ref 135–145)
Total Bilirubin: 0.3 mg/dL (ref 0.3–1.2)
Total Protein: 7.3 g/dL (ref 6.5–8.1)

## 2020-07-26 LAB — URINALYSIS, COMPLETE (UACMP) WITH MICROSCOPIC
Bacteria, UA: NONE SEEN
Bilirubin Urine: NEGATIVE
Glucose, UA: NEGATIVE mg/dL
Hgb urine dipstick: NEGATIVE
Ketones, ur: NEGATIVE mg/dL
Leukocytes,Ua: NEGATIVE
Nitrite: NEGATIVE
Protein, ur: NEGATIVE mg/dL
Specific Gravity, Urine: 1.014 (ref 1.005–1.030)
pH: 5 (ref 5.0–8.0)

## 2020-07-26 LAB — RETICULOCYTES
Immature Retic Fract: 24.5 % — ABNORMAL HIGH (ref 2.3–15.9)
RBC.: 4.34 MIL/uL (ref 3.87–5.11)
Retic Count, Absolute: 83.3 10*3/uL (ref 19.0–186.0)
Retic Ct Pct: 1.9 % (ref 0.4–3.1)

## 2020-07-26 LAB — SEDIMENTATION RATE: Sed Rate: 62 mm/hr — ABNORMAL HIGH (ref 0–30)

## 2020-07-26 LAB — LACTIC ACID, PLASMA: Lactic Acid, Venous: 0.8 mmol/L (ref 0.5–1.9)

## 2020-07-26 MED ORDER — HYDROMORPHONE HCL 1 MG/ML IJ SOLN
0.5000 mg | INTRAMUSCULAR | Status: DC | PRN
  Administered 2020-07-26 – 2020-07-27 (×2): 0.5 mg via INTRAVENOUS
  Filled 2020-07-26 (×2): qty 1

## 2020-07-26 MED ORDER — HYDROMORPHONE HCL 1 MG/ML IJ SOLN
1.0000 mg | Freq: Once | INTRAMUSCULAR | Status: AC
  Administered 2020-07-26: 1 mg via INTRAVENOUS
  Filled 2020-07-26: qty 1

## 2020-07-26 MED ORDER — MORPHINE SULFATE (PF) 2 MG/ML IV SOLN
2.0000 mg | Freq: Once | INTRAVENOUS | Status: AC
  Administered 2020-07-26: 2 mg via INTRAVENOUS
  Filled 2020-07-26: qty 1

## 2020-07-26 MED ORDER — KETOROLAC TROMETHAMINE 30 MG/ML IJ SOLN
INTRAMUSCULAR | Status: AC
  Administered 2020-07-26: 15 mg via INTRAVENOUS
  Filled 2020-07-26: qty 1

## 2020-07-26 MED ORDER — ACETAMINOPHEN 650 MG RE SUPP: 650.0000 mg | Freq: Four times a day (QID) | RECTAL | Status: DC | PRN

## 2020-07-26 MED ORDER — ASPIRIN EC 81 MG PO TBEC
81.0000 mg | DELAYED_RELEASE_TABLET | Freq: Every day | ORAL | Status: DC
  Administered 2020-07-27 – 2020-07-30 (×4): 81 mg via ORAL
  Filled 2020-07-26 (×4): qty 1

## 2020-07-26 MED ORDER — ONDANSETRON HCL 4 MG/2ML IJ SOLN
4.0000 mg | Freq: Once | INTRAMUSCULAR | Status: AC
  Administered 2020-07-26: 4 mg via INTRAVENOUS
  Filled 2020-07-26: qty 2

## 2020-07-26 MED ORDER — SODIUM CHLORIDE 0.9 % IV BOLUS
1000.0000 mL | Freq: Once | INTRAVENOUS | Status: AC
  Administered 2020-07-26: 1000 mL via INTRAVENOUS

## 2020-07-26 MED ORDER — MINOCYCLINE HCL 50 MG PO CAPS
100.0000 mg | ORAL_CAPSULE | Freq: Two times a day (BID) | ORAL | Status: DC
  Administered 2020-07-26 – 2020-07-27 (×3): 100 mg via ORAL
  Filled 2020-07-26 (×5): qty 2

## 2020-07-26 MED ORDER — SODIUM CHLORIDE 0.9 % IV SOLN
1.0000 g | Freq: Once | INTRAVENOUS | Status: AC
  Administered 2020-07-26: 1 g via INTRAVENOUS
  Filled 2020-07-26: qty 10

## 2020-07-26 MED ORDER — KETOROLAC TROMETHAMINE 30 MG/ML IJ SOLN: 15.0000 mg | Freq: Once | INTRAMUSCULAR | Status: AC

## 2020-07-26 MED ORDER — ACETAMINOPHEN 325 MG PO TABS
650.0000 mg | ORAL_TABLET | Freq: Four times a day (QID) | ORAL | Status: DC | PRN
  Administered 2020-07-27 (×2): 650 mg via ORAL
  Filled 2020-07-26 (×2): qty 2

## 2020-07-26 MED ORDER — HEPARIN SODIUM (PORCINE) 5000 UNIT/ML IJ SOLN
5000.0000 [IU] | Freq: Two times a day (BID) | INTRAMUSCULAR | Status: DC
  Administered 2020-07-26 – 2020-07-27 (×3): 5000 [IU] via SUBCUTANEOUS
  Filled 2020-07-26 (×3): qty 1

## 2020-07-26 MED ORDER — NICOTINE 14 MG/24HR TD PT24
14.0000 mg | MEDICATED_PATCH | Freq: Every day | TRANSDERMAL | Status: DC
  Administered 2020-07-26 – 2020-07-30 (×5): 14 mg via TRANSDERMAL
  Filled 2020-07-26 (×5): qty 1

## 2020-07-26 MED ORDER — PANTOPRAZOLE SODIUM 40 MG IV SOLR
40.0000 mg | INTRAVENOUS | Status: DC
  Administered 2020-07-26 – 2020-07-27 (×2): 40 mg via INTRAVENOUS
  Filled 2020-07-26 (×2): qty 40

## 2020-07-26 NOTE — H&P (Signed)
History and Physical    Alexandra [name] XNT:[id number] DOB: Aug 28, 1970 DOA: 07/26/2020  [facility]: [facility]    Patient coming from:  Home    Chief Complaint:  LE pain.   HPI: Alexandra [name] is a 50 y.o. female seen in ed with complaints of bl foot pain. Pt has rash on both feet.the rash is necrotic and healing and appears like a scab is present and healing.the rash on both feet started one at a time. Since about a month ago , pt started to have rt foot rash that was itchy and then it formed a blister and yellow pus in it and then it turns into a crusty appearing rash with granulation and necrotic appearance. Then the lesion or rash starts with blister and then it changes in to a scarred area.it has progressed from rt foot to left foot and for over a months now. And pt has been wearing open shoes, and has been using aleve and was given steroids it did not help. Also gave gabapentin and has not helped pain.  The right leg has always been worse. D/w pt that this could be a dermatologic process with superimposed infection or rheumatologic process with superimposed infection and also that it  mcoul be just an infection. Advised to stop smoking as this could be  Making it worse. Pt has not had a colonoscopy and only other symptom is that  In feb she recalls having so much pain in both her hands feet.rheumatology and dermatology appt has not called her back yet.    Pt has past medical history of      ED Course:  Vitals:   07/26/20 1730 07/26/20 1737 07/26/20 2055 07/26/20 2130  BP: 137/70  111/70 109/69  Pulse: 96  81 83  Resp: 18  18 18   Temp: 98.7 F (37.1 C)     TempSrc: Oral     SpO2: 97%  98% 97%  Weight:  62.1 kg    Height:  5\' 2"  (1.575 m)        Review of Systems:  Review of Systems  Constitutional: Negative.   HENT: Negative.   Eyes: Negative.   Respiratory: Negative.   Cardiovascular: Negative.   Gastrointestinal: Negative.   Genitourinary:  Negative.   Musculoskeletal: Negative.   Skin: Positive for rash.  Neurological: Negative.   Endo/Heme/Allergies: Negative.   Psychiatric/Behavioral: Negative.   All other systems reviewed and are negative.    Past Medical History:  Diagnosis Date  . Breast discharge 2013   present X 3 years  Left only multiple ducts per pt  . Depression     Past Surgical History:  Procedure Laterality Date  . ABDOMINAL HYSTERECTOMY    . BREAST CYST EXCISION Left 2006     reports that she has been smoking cigarettes. She has a 29.00 pack-year smoking history. She has never used smokeless tobacco. She reports current alcohol use. She reports that she does not use drugs.  No Known Allergies  Family History  Problem Relation Age of Onset  . Breast cancer Mother 53  . Breast cancer Maternal Aunt   . Breast cancer Maternal Grandmother   . Breast cancer Maternal Aunt        Maternal Great Aunt   . Breast cancer Cousin 31    Prior to Admission medications   Medication Sig Start Date End Date Taking? Authorizing Provider  ARIPiprazole (ABILIFY) 2 MG tablet Take 2 mg by mouth daily.  [provider]  buPROPion (WELLBUTRIN) 100 MG tablet Take 100 mg by mouth 2 (two) times daily.    [provider]  albuterol (PROVENTIL HFA;VENTOLIN HFA) 108 (90 Base) MCG/ACT inhaler Inhale 2 puffs into the lungs every 6 (six) hours as needed. 03/26/17 12/20/19  Rebecka Apley, MD    Physical Exam: Vitals:   07/26/20 1730 07/26/20 1737 07/26/20 2055 07/26/20 2130  BP: 137/70  111/70 109/69  Pulse: 96  81 83  Resp: 18  18 18   Temp: 98.7 F (37.1 C)     TempSrc: Oral     SpO2: 97%  98% 97%  Weight:  62.1 kg    Height:  5\' 2"  (1.575 m)     Physical Exam Vitals and nursing note reviewed.  Constitutional:      Appearance: Normal appearance. She is obese.  HENT:     Head: Normocephalic and atraumatic.     Right Ear: External ear normal.     Left Ear: External ear normal.     Nose:  Nose normal.     Mouth/Throat:     Mouth: Mucous membranes are moist.  Eyes:     Extraocular Movements: Extraocular movements intact.     Pupils: Pupils are equal, round, and reactive to light.  Neck:     Vascular: No carotid bruit.  Cardiovascular:     Rate and Rhythm: Normal rate and regular rhythm.     Pulses: Normal pulses.     Heart sounds: Normal heart sounds.  Pulmonary:     Effort: Pulmonary effort is normal.     Breath sounds: Normal breath sounds.  Abdominal:     General: Bowel sounds are normal. There is no distension.     Palpations: Abdomen is soft.     Tenderness: There is no abdominal tenderness. There is no guarding.  Musculoskeletal:     Right lower leg: No edema.     Left lower leg: No edema.  Skin:    General: Skin is warm.     Capillary Refill: Capillary refill takes less than 2 seconds. Cap refill on toes is less than 2 seconds and hand is normal /.    Coloration: Skin is pale.     Findings: Erythema, lesion and rash present. Rash is crusting, papular and purpuric.          Comments: + livedo reticularis.  Neurological:     General: No focal deficit present.     Mental Status: She is alert and oriented to person, place, and time.  Psychiatric:        Mood and Affect: Mood normal.        Behavior: Behavior normal.     Labs on Admission: I have personally reviewed following labs and imaging studies  No results for input(s): CKTOTAL, CKMB, TROPONINI in the last 72 hours. Lab Results  Component Value Date   WBC 12.1 (H) 07/26/2020   HGB 10.9 (L) 07/26/2020   HCT 35.8 (L) 07/26/2020   MCV 77.2 (L) 07/26/2020   PLT 617 (H) 07/26/2020    Recent Labs  Lab 07/26/20 1739  NA 139  K 4.3  CL 102  CO2 25  BUN 16  CREATININE 0.70  CALCIUM 9.1  PROT 7.3  BILITOT 0.3  ALKPHOS 71  ALT 12  AST 14*  GLUCOSE 88   No results found for: CHOL, HDL, LDLCALC, TRIG No results found for: DDIMER Invalid input(s): POCBNP  Urinalysis    Component Value  Date/Time  COLORURINE YELLOW 05/01/2020 0708   APPEARANCEUR HAZY (A) 05/01/2020 0708   APPEARANCEUR Clear 09/17/2013 1229   LABSPEC 1.020 05/01/2020 0708   LABSPEC 1.008 09/17/2013 1229   PHURINE 5.0 05/01/2020 0708   GLUCOSEU NEGATIVE 05/01/2020 0708   GLUCOSEU Negative 09/17/2013 1229   HGBUR NEGATIVE 05/01/2020 0708   BILIRUBINUR NEGATIVE 05/01/2020 0708   BILIRUBINUR Negative 09/17/2013 1229   KETONESUR NEGATIVE 05/01/2020 0708   PROTEINUR NEGATIVE 05/01/2020 0708   NITRITE NEGATIVE 05/01/2020 0708   LEUKOCYTESUR NEGATIVE 05/01/2020 0708   LEUKOCYTESUR Negative 09/17/2013 1229   COVID-19 Labs No results for input(s): DDIMER, FERRITIN, LDH, CRP in the last 72 hours.  Lab Results  Component Value Date   SARSCOV2NAA Not Detected 01/05/2019    Radiological Exams on Admission: DG Ankle Complete Right  Result Date: 07/26/2020 CLINICAL DATA:  Pain, swelling, lesions. EXAM: RIGHT ANKLE - COMPLETE 3+ VIEW COMPARISON:  Ankle radiograph 06/30/2020 FINDINGS: There is no evidence of fracture or dislocation. There is no evidence of arthropathy or other focal bone abnormality. Tiny Achilles tendon enthesophyte. Skin and soft tissue irregularity about the lateral aspect of the ankle. No tracking soft tissue air. No radiopaque foreign body. Circumferential soft tissue edema. IMPRESSION: 1. Skin and soft tissue irregularity about the lateral aspect of the ankle. Circumferential soft tissue edema. 2. No osseous abnormalities. Electronically Signed   By: Narda Rutherford M.D.   On: 07/26/2020 19:14   US Venous Img Lower Bilateral  Result Date: 07/26/2020 CLINICAL DATA:  Bilateral lower extremity pain and edema EXAM: BILATERAL LOWER EXTREMITY VENOUS DUPLEX ULTRASOUND TECHNIQUE: Gray-scale sonography with graded compression, as well as color Doppler and duplex ultrasound were performed to evaluate the lower extremity deep venous systems from the level of the common femoral vein and including the  common femoral, femoral, profunda femoral, popliteal and calf veins including the posterior tibial, peroneal and gastrocnemius veins when visible. The superficial great saphenous vein was also interrogated. Spectral Doppler was utilized to evaluate flow at rest and with distal augmentation maneuvers in the common femoral, femoral and popliteal veins. COMPARISON:  None. FINDINGS: RIGHT LOWER EXTREMITY Common Femoral Vein: No evidence of thrombus. Normal compressibility, respiratory phasicity and response to augmentation. Saphenofemoral Junction: No evidence of thrombus. Normal compressibility and flow on color Doppler imaging. Profunda Femoral Vein: No evidence of thrombus. Normal compressibility and flow on color Doppler imaging. Femoral Vein: No evidence of thrombus. Normal compressibility, respiratory phasicity and response to augmentation. Popliteal Vein: No evidence of thrombus. Normal compressibility, respiratory phasicity and response to augmentation. Calf Veins: No evidence of thrombus. Normal compressibility and flow on color Doppler imaging. Superficial Great Saphenous Vein: No evidence of thrombus. Normal compressibility. Venous Reflux:  None. Other Findings: There is a popliteal/Baker's cyst in the right popliteal fossa measuring 2.3 x 0.9 cm. LEFT LOWER EXTREMITY Common Femoral Vein: No evidence of thrombus. Normal compressibility, respiratory phasicity and response to augmentation. Saphenofemoral Junction: No evidence of thrombus. Normal compressibility and flow on color Doppler imaging. Profunda Femoral Vein: No evidence of thrombus. Normal compressibility and flow on color Doppler imaging. Femoral Vein: No evidence of thrombus. Normal compressibility, respiratory phasicity and response to augmentation. Popliteal Vein: No evidence of thrombus. Normal compressibility, respiratory phasicity and response to augmentation. Calf Veins: No evidence of thrombus. Normal compressibility and flow on color Doppler  imaging. Superficial Great Saphenous Vein: No evidence of thrombus. Normal compressibility. Venous Reflux:  None. Other Findings:  None. IMPRESSION: No evidence of deep venous thrombosis in either lower extremity. Baker's cyst  right popliteal fossa measuring 2.3 x 0.9 cm. Electronically Signed   By: Bretta BangWilliam  Woodruff III M.D.   On: 07/26/2020 20:03    EKG: Independently reviewed.  None   Assessment/Plan Principal Problem:   Rash of both feet Active Problems:   Cellulitis   Foot pain, bilateral   Tobacco use disorder Rash/ Cellulitis/ BL FOOT PAIN: D/d include pyoderma gangrenosum,, necrotizing neutrophilic dermatosis, vasculitis, cellulitis. Continue patient on steroids and start minocycline.  Dermatology consult per a.m. team. Will cover the areas with dry gauze dressings. Will obtain ANA, rheumatoid factor, CRP, sed rate. As needed Dilaudid for bilateral foot pain and inability to bear weight.  Bilateral foot pain: Question embolic phenomenon, no trauma no procedures. Question antiphospholipid antibody syndrome.  Tobacco abuse: Counseled pt on cessation.    DVT prophylaxis:  Heparin  Code Status:  Full code   Family Communication:  Norma FredricksonGurkin,Clara (Mother)  [phone] (Home Phone)   Disposition Plan:  home   Consults called:  None   Admission status: Inpatient.     Gertha CalkinEkta V Becky Berberian MD [facility] [phone] How to contact the [facility] Attending or Consulting provider 7A - 7P or covering provider during after hours 7P -7A, for this patient.    1. Check the care team in [facility] and look for a) attending/consulting [facility] provider listed and b) the [facility] team listed 2. Log into [email or web address] and use [facility]'s universal password to access. If you do not have the password, please contact the hospital operator. 3. Locate the [facility] provider you are looking for under [facility] and page to a number that you can be directly reached. 4. If you still have difficulty  reaching the provider, please page the [facility] (Director on Call) for the Hospitalists listed on amion for assistance. [email or web address] Password [email or web address] 07/26/2020, 10:04 PM

## 2020-07-26 NOTE — ED Notes (Signed)
Admitting provider at bedside.

## 2020-07-26 NOTE — ED Provider Notes (Signed)
[facility] Emergency Department Provider Note  ____________________________________________  Time seen: Approximately 6:21 PM  I have reviewed the triage vital signs and the nursing notes.   HISTORY  Chief Complaint Cellulitis   HPI Alexandra [name] is a 50 y.o. female presenting to the emergency department for treatment and evaluation of bilateral ankle pain and swelling with associated lesions.  Symptoms have been present for  the past 2 to 3 months.  Initially, she felt neuropathic like pain.  Lesions developed approximately 1 month ago in these areas.  She states that the area started as erythematous and progressed to fluid filled pustules, then have a scab. Areas are excessively painful. No relief with gabapentin.   Past Medical History:  Diagnosis Date  . Breast discharge 2013   present X 3 years  Left only multiple ducts per pt  . Depression     Patient Active Problem List   Diagnosis Date Noted  . Radiculopathy affecting upper extremity 02/16/2016  . Neck pain 02/16/2016  . Protrusion of cervical intervertebral disc 02/16/2016  . Depression 02/16/2016  . 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 Tobacco use disorder 02/09/2016    Past Surgical History:  Procedure Laterality Date  . ABDOMINAL HYSTERECTOMY    . BREAST CYST EXCISION Left 2006    Prior to Admission medications   Medication Sig Start Date End Date Taking? Authorizing Provider  ARIPiprazole (ABILIFY) 2 MG tablet Take 2 mg by mouth daily.    [provider]  buPROPion (WELLBUTRIN) 100 MG tablet Take 100 mg by mouth 2 (two) times daily.    [provider]  albuterol (PROVENTIL HFA;VENTOLIN HFA) 108 (90 Base) MCG/ACT inhaler Inhale 2 puffs into the lungs every 6 (six) hours as needed. 03/26/17 12/20/19  Rebecka Apley, MD    Allergies Patient has no known allergies.  Family History  Problem Relation Age of Onset   . Breast cancer Mother 27  . Breast cancer Maternal Aunt   . Breast cancer Maternal Grandmother   . Breast cancer Maternal Aunt        Maternal Great Aunt   . Breast cancer Cousin 56    Social History Social History   Tobacco Use  . Smoking status: Current Every Day Smoker    Packs/day: 1.00    Years: 29.00    Pack years: 29.00    Types: Cigarettes  . Smokeless tobacco: Never Used  Substance Use Topics  . Alcohol use: Yes    Alcohol/week: 0.0 standard drinks    Comment: social  . Drug use: No    Review of Systems  Constitutional: Negative for fever. Respiratory: Negative for cough or shortness of breath.  Musculoskeletal: Negative for myalgias Skin: Positive for lesions. Neurological: Negative for numbness or paresthesias. ____________________________________________   PHYSICAL EXAM:  VITAL SIGNS: ED Triage Vitals  Enc Vitals Group     BP 07/26/20 1730 137/70     Pulse Rate 07/26/20 1730 96     Resp 07/26/20 1730 18     Temp 07/26/20 1730 98.7 F (37.1 C)     Temp Source 07/26/20 1730 Oral     SpO2 07/26/20 1730 97 %     Weight 07/26/20 1737 137 lb (62.1 kg)     Height 07/26/20 1737 5\' 2"  (1.575 m)     Head Circumference --      Peak Flow --      Pain Score 07/26/20 1737 10  Pain Loc --      Pain Edu? --      Excl. in GC? --      Constitutional: Uncomfortable appearing. Eyes: Conjunctivae are clear without discharge or drainage. Nose: No rhinorrhea noted. Mouth/Throat: Airway is patent.  Neck: No stridor. Unrestricted range of motion observed. Cardiovascular: Capillary refill is <3 seconds.  Respiratory: Respirations are even and unlabored.. Musculoskeletal: Unrestricted range of motion observed. Neurologic: Awake, alert, and oriented x 4.  Skin: See below.          ____________________________________________   LABS (all labs ordered are listed, but only abnormal results are displayed)  Labs Reviewed  COMPREHENSIVE METABOLIC  PANEL - Abnormal; Notable for the following components:      Result Value   Albumin 3.2 (*)    AST 14 (*)    All other components within normal limits  CBC WITH DIFFERENTIAL/PLATELET - Abnormal; Notable for the following components:   WBC 12.1 (*)    Hemoglobin 10.9 (*)    HCT 35.8 (*)    MCV 77.2 (*)    MCH 23.5 (*)    RDW 19.0 (*)    Platelets 617 (*)    Eosinophils Absolute 0.6 (*)    All other components within normal limits  SEDIMENTATION RATE - Abnormal; Notable for the following components:   Sed Rate 62 (*)    All other components within normal limits  CULTURE, BLOOD (ROUTINE X 2)  CULTURE, BLOOD (ROUTINE X 2)  LACTIC ACID, PLASMA  URINALYSIS, COMPLETE (UACMP) WITH MICROSCOPIC  C-REACTIVE PROTEIN  ANA W/REFLEX IF POSITIVE  HEPATITIS PANEL, ACUTE   ____________________________________________  EKG  Not indicated. ____________________________________________  RADIOLOGY  No DVT bilaterally.  No evidence of osteomyelitis on DG image of the right ankle. ____________________________________________   PROCEDURES  Procedures ____________________________________________   INITIAL IMPRESSION / ASSESSMENT AND PLAN / ED COURSE  Alexandra [name] is a 50 y.o. female presenting to the emergency department for treatment and evaluation of lesions to lower extremities.  See HPI for further details.   No improvement in pain after IV Dilaudid.  Plan will be to give her IV Toradol.  Pain significantly decreased after IV Toradol.  Bilateral ultrasounds were negative for DVT.  Image of the right ankle is negative for osteomyelitis.  Plan will be to admit the patient for further work-up.  Her sed rate is elevated, she has a mild leukocytosis, and mild anemia.  She also has cellulitis in the right foot and ankle.  Patient accepted for admission by Dr. Allena Katz.  Medications  sodium chloride 0.9 % bolus 1,000 mL (0 mLs Intravenous Stopped 07/26/20 1942)  HYDROmorphone (DILAUDID)  injection 1 mg (1 mg Intravenous Given 07/26/20 1837)  ondansetron (ZOFRAN) injection 4 mg (4 mg Intravenous Given 07/26/20 1837)  cefTRIAXone (ROCEPHIN) 1 g in sodium chloride 0.9 % 100 mL IVPB (0 g Intravenous Stopped 07/26/20 1902)  ketorolac (TORADOL) 30 MG/ML injection 15 mg (15 mg Intravenous Given 07/26/20 1913)  morphine 2 MG/ML injection 2 mg (2 mg Intravenous Given 07/26/20 2057)     Pertinent labs & imaging results that were available during my care of the patient were reviewed by me and considered in my medical decision making (see chart for details).  ____________________________________________   FINAL CLINICAL IMPRESSION(S) / ED DIAGNOSES  Final diagnoses:  Cellulitis of right lower extremity  Skin lesions  Neuropathy    ED Discharge Orders    None       Note:  This document  was prepared using Conservation officer, historic buildings and may include unintentional dictation errors.   Chinita Pester, FNP 07/26/20 2108    Delton Prairie, MD 07/26/20 2306

## 2020-07-26 NOTE — ED Notes (Signed)
Urine, 3 sst, 1 lav, 1 lt green sent to lab

## 2020-07-26 NOTE — ED Triage Notes (Addendum)
Pt states that her ankles are swollen and painful- pt states she starts with a red rash and then it starts to leak yellow fluid and then it scabs over black- pt has multiple areas on both ankles that are scabbed over- bilateral ankles are swollen and red- pt states it started a month ago and she has been to multiple doctors, including a dermatologist for it and nothing has heloped

## 2020-07-26 NOTE — ED Notes (Signed)
3 sst sent to lab

## 2020-07-27 LAB — IRON AND TIBC
Iron: 18 ug/dL — ABNORMAL LOW (ref 28–170)
Saturation Ratios: 5 % — ABNORMAL LOW (ref 10.4–31.8)
TIBC: 357 ug/dL (ref 250–450)
UIBC: 339 ug/dL

## 2020-07-27 LAB — SARS CORONAVIRUS 2 (TAT 6-24 HRS): SARS Coronavirus 2: NEGATIVE

## 2020-07-27 LAB — FOLATE: Folate: 7.7 ng/mL (ref >5.9)

## 2020-07-27 LAB — T4, FREE: Free T4: 1 ng/dL (ref 0.61–1.12)

## 2020-07-27 LAB — FERRITIN: Ferritin: 9 ng/mL — ABNORMAL LOW (ref 11–307)

## 2020-07-27 LAB — C-REACTIVE PROTEIN: CRP: 1.9 mg/dL — ABNORMAL HIGH (ref <1.0)

## 2020-07-27 LAB — TSH: TSH: 3.498 u[IU]/mL (ref 0.350–4.500)

## 2020-07-27 LAB — VITAMIN B12: Vitamin B-12: 781 pg/mL (ref 180–914)

## 2020-07-27 MED ORDER — HYDROCODONE-ACETAMINOPHEN 5-325 MG PO TABS
1.0000 | ORAL_TABLET | Freq: Four times a day (QID) | ORAL | Status: DC | PRN
  Administered 2020-07-27 – 2020-07-28 (×4): 1 via ORAL
  Filled 2020-07-27 (×4): qty 1

## 2020-07-27 MED ORDER — GABAPENTIN 300 MG PO CAPS
300.0000 mg | ORAL_CAPSULE | Freq: Three times a day (TID) | ORAL | Status: DC
  Administered 2020-07-27 – 2020-07-30 (×11): 300 mg via ORAL
  Filled 2020-07-27 (×11): qty 1

## 2020-07-27 MED ORDER — ENOXAPARIN SODIUM 40 MG/0.4ML IJ SOSY
40.0000 mg | PREFILLED_SYRINGE | INTRAMUSCULAR | Status: DC
  Administered 2020-07-28 – 2020-07-30 (×3): 40 mg via SUBCUTANEOUS
  Filled 2020-07-27 (×3): qty 0.4

## 2020-07-27 MED ORDER — NAPROXEN 500 MG PO TABS
500.0000 mg | ORAL_TABLET | Freq: Two times a day (BID) | ORAL | Status: DC
  Administered 2020-07-27 – 2020-07-29 (×5): 500 mg via ORAL
  Filled 2020-07-27 (×6): qty 1

## 2020-07-27 MED ORDER — NAPROXEN 500 MG PO TABS
500.0000 mg | ORAL_TABLET | Freq: Three times a day (TID) | ORAL | Status: DC | PRN
  Filled 2020-07-27 (×2): qty 1

## 2020-07-27 MED ORDER — ACETAMINOPHEN 500 MG PO TABS
1000.0000 mg | ORAL_TABLET | Freq: Three times a day (TID) | ORAL | Status: DC | PRN
  Administered 2020-07-27 – 2020-07-29 (×2): 1000 mg via ORAL
  Filled 2020-07-27 (×2): qty 2

## 2020-07-27 NOTE — Progress Notes (Signed)
Nurse observed scab to right ankle area with dry drainage to lateral and distal sides.  Left ankle has scab with no dry drainage.  Redness noted to bilateral ankles with pain in both lowers legs.  Edema to the outer right ankle observed.  Patient has SCDs in place.  Unable to place yellow socks on patient due to the pain in ankles.  Yellow socks at bedside.  Patient is independent and ambulatory.  The second nurse Coletta Memos RN at bedside at this time.

## 2020-07-27 NOTE — Plan of Care (Signed)
Patient will remain free from infection.  Patient shown proper handwashing technique .

## 2020-07-27 NOTE — Progress Notes (Signed)
PROGRESS NOTE    Alexandra [name]  SFK:[id number] DOB: [birth year] DOA: 07/26/2020 PCP: [facility]  [id number]/[id number]-[id number]   Assessment & Plan:   Principal Problem:   Rash of both feet Active Problems:   Tobacco use disorder   Cellulitis   Foot pain, bilateral   Rash and nonspecific skin eruption   Alexandra [name] is a 50 y.o. female seen in ed with complaints of bl foot pain.  rash on both feet started one at a time. Since about a month ago , pt started to have rt foot rash that was itchy and then it formed a blister and yellow pus in it and then it turns into a crusty appearing rash with granulation and necrotic appearance.  it has progressed from rt foot to left foot and for over a months now. And pt has been wearing open shoes, and has been using aleve and was given steroids it did not help. Also gave gabapentin and has not helped pain.   Rash and wound over bilateral ankles --based on the hx and symptoms, query shingles?  Does not look like cellulitis.   --Had received steroid as outpatient. Plan: --naproxen, gabapentin scheduled for pain --Norco PRN --Hold abx for now --will consult dermatology on Monday  Tobacco abuse: --nicotine patch   DVT prophylaxis: Lovenox SQ Code Status: Full code  Family Communication:  Level of care: Med-Surg Dispo:   The patient is from: home Anticipated d/c is to: home Anticipated d/c date is: 1-2 days Patient currently is not medically ready to d/c due to: pending derm consult   Subjective and Interval History:  Pt reported burning pain around her ankles, and sharp pain inside her ankles.     Objective: Vitals:   07/27/20 0536 07/27/20 0815 07/27/20 1533 07/27/20 2045  BP: 92/63 (!) 90/56 (!) 93/50 (!) 109/52  Pulse: 86 83 83 89  Resp: 15 16 15 17   Temp: 98.7 F (37.1 C) 98.5 F (36.9 C) 99.2 F (37.3 C) 98.4 F (36.9 C)  TempSrc:      SpO2: 97% 97% 97% 96%  Weight:      Height:        Intake/Output  Summary (Last 24 hours) at 07/27/2020 2329 Last data filed at 07/27/2020 1819 Gross per 24 hour  Intake 840 ml  Output --  Net 840 ml   Filed Weights   07/26/20 1737  Weight: 62.1 kg    Examination:   Constitutional: NAD, AAOx3 HEENT: conjunctivae and lids normal, EOMI CV: No cyanosis.   RESP: normal respiratory effort, on RA Extremities: edema in both ankles, R>L SKIN: warm, dry.  Scabs around ankles, R>L Neuro: II - XII grossly intact.   Psych: Normal mood and affect.  Appropriate judgement and reason  07/26/20       Data Reviewed: I have personally reviewed following labs and imaging studies  CBC: Recent Labs  Lab 07/26/20 1739  WBC 12.1*  NEUTROABS 7.4  HGB 10.9*  HCT 35.8*  MCV 77.2*  PLT 617*   Basic Metabolic Panel: Recent Labs  Lab 07/26/20 1739  NA 139  K 4.3  CL 102  CO2 25  GLUCOSE 88  BUN 16  CREATININE 0.70  CALCIUM 9.1   GFR: Estimated Creatinine Clearance: 72.1 mL/min (by C-G formula based on SCr of 0.7 mg/dL). Liver Function Tests: Recent Labs  Lab 07/26/20 1739  AST 14*  ALT 12  ALKPHOS 71  BILITOT 0.3  PROT 7.3  ALBUMIN 3.2*  No results for input(s): LIPASE, AMYLASE in the last 168 hours. No results for input(s): AMMONIA in the last 168 hours. Coagulation Profile: No results for input(s): INR, PROTIME in the last 168 hours. Cardiac Enzymes: No results for input(s): CKTOTAL, CKMB, CKMBINDEX, TROPONINI in the last 168 hours. BNP (last 3 results) No results for input(s): PROBNP in the last 8760 hours. HbA1C: No results for input(s): HGBA1C in the last 72 hours. CBG: No results for input(s): GLUCAP in the last 168 hours. Lipid Profile: No results for input(s): CHOL, HDL, LDLCALC, TRIG, CHOLHDL, LDLDIRECT in the last 72 hours. Thyroid Function Tests: Recent Labs    07/26/20 2253  TSH 3.498  FREET4 1.00   Anemia Panel: Recent Labs    07/26/20 2253  VITAMINB12 781  FOLATE 7.7  FERRITIN 9*  TIBC 357  IRON 18*   RETICCTPCT 1.9   Sepsis Labs: Recent Labs  Lab 07/26/20 1739  LATICACIDVEN 0.8    Recent Results (from the past 240 hour(s))  Blood culture (routine x 2)     Status: None (Preliminary result)   Collection Time: 07/26/20  6:35 PM   Specimen: BLOOD  Result Value Ref Range Status   Specimen Description BLOOD  LEFT AC  Final   Special Requests   Final    BOTTLES DRAWN AEROBIC AND ANAEROBIC Blood Culture results may not be optimal due to an inadequate volume of blood received in culture bottles   Culture   Final    NO GROWTH < 12 HOURS Performed at [facility], [address] [place], [place] [address]    Report Status PENDING  Incomplete  Blood culture (routine x 2)     Status: None (Preliminary result)   Collection Time: 07/26/20  6:36 PM   Specimen: BLOOD  Result Value Ref Range Status   Specimen Description BLOOD  RIGHT [facility]  Final   Special Requests   Final    BOTTLES DRAWN AEROBIC AND ANAEROBIC Blood Culture adequate volume   Culture   Final    NO GROWTH < 12 HOURS Performed at [facility], [address] [place], [place] [address]    Report Status PENDING  Incomplete  SARS CORONAVIRUS 2 (TAT 6-24 HRS) Nasopharyngeal Nasopharyngeal Swab     Status: None   Collection Time: 07/26/20 11:47 PM   Specimen: Nasopharyngeal Swab  Result Value Ref Range Status   SARS Coronavirus 2 NEGATIVE NEGATIVE Final    Comment: (NOTE) SARS-CoV-2 target nucleic acids are NOT DETECTED.  The SARS-CoV-2 RNA is generally detectable in upper and lower respiratory specimens during the acute phase of infection. Negative results do not preclude SARS-CoV-2 infection, do not rule out co-infections with other pathogens, and should not be used as the sole basis for treatment or other patient management decisions. Negative results must be combined with clinical observations, patient history, and epidemiological information. The expected result is Negative.  Fact Sheet for  Patients: HairSlick.no  Fact Sheet for Healthcare Providers: quierodirigir.com  This test is not yet approved or cleared by the [place] FDA and  has been authorized for detection and/or diagnosis of SARS-CoV-2 by FDA under an Emergency Use Authorization (EUA). This EUA will remain  in effect (meaning this test can be used) for the duration of the COVID-19 declaration under Se ction 564(b)(1) of the Act, 21 U.S.C. section 360bbb-3(b)(1), unless the authorization is terminated or revoked sooner.  Performed at [facility], [address], [place], [place] [address]  Radiology Studies: DG Ankle Complete Right  Result Date: 07/26/2020 CLINICAL DATA:  Pain, swelling, lesions. EXAM: RIGHT ANKLE - COMPLETE 3+ VIEW COMPARISON:  Ankle radiograph 06/30/2020 FINDINGS: There is no evidence of fracture or dislocation. There is no evidence of arthropathy or other focal bone abnormality. Tiny Achilles tendon enthesophyte. Skin and soft tissue irregularity about the lateral aspect of the ankle. No tracking soft tissue air. No radiopaque foreign body. Circumferential soft tissue edema. IMPRESSION: 1. Skin and soft tissue irregularity about the lateral aspect of the ankle. Circumferential soft tissue edema. 2. No osseous abnormalities. Electronically Signed   By: Narda Rutherford M.D.   On: 07/26/2020 19:14   US Venous Img Lower Bilateral  Result Date: 07/26/2020 CLINICAL DATA:  Bilateral lower extremity pain and edema EXAM: BILATERAL LOWER EXTREMITY VENOUS DUPLEX ULTRASOUND TECHNIQUE: Gray-scale sonography with graded compression, as well as color Doppler and duplex ultrasound were performed to evaluate the lower extremity deep venous systems from the level of the common femoral vein and including the common femoral, femoral, profunda femoral, popliteal and calf veins including the posterior tibial, peroneal and gastrocnemius veins when  visible. The superficial great saphenous vein was also interrogated. Spectral Doppler was utilized to evaluate flow at rest and with distal augmentation maneuvers in the common femoral, femoral and popliteal veins. COMPARISON:  None. FINDINGS: RIGHT LOWER EXTREMITY Common Femoral Vein: No evidence of thrombus. Normal compressibility, respiratory phasicity and response to augmentation. Saphenofemoral Junction: No evidence of thrombus. Normal compressibility and flow on color Doppler imaging. Profunda Femoral Vein: No evidence of thrombus. Normal compressibility and flow on color Doppler imaging. Femoral Vein: No evidence of thrombus. Normal compressibility, respiratory phasicity and response to augmentation. Popliteal Vein: No evidence of thrombus. Normal compressibility, respiratory phasicity and response to augmentation. Calf Veins: No evidence of thrombus. Normal compressibility and flow on color Doppler imaging. Superficial Great Saphenous Vein: No evidence of thrombus. Normal compressibility. Venous Reflux:  None. Other Findings: There is a popliteal/Baker's cyst in the right popliteal fossa measuring 2.3 x 0.9 cm. LEFT LOWER EXTREMITY Common Femoral Vein: No evidence of thrombus. Normal compressibility, respiratory phasicity and response to augmentation. Saphenofemoral Junction: No evidence of thrombus. Normal compressibility and flow on color Doppler imaging. Profunda Femoral Vein: No evidence of thrombus. Normal compressibility and flow on color Doppler imaging. Femoral Vein: No evidence of thrombus. Normal compressibility, respiratory phasicity and response to augmentation. Popliteal Vein: No evidence of thrombus. Normal compressibility, respiratory phasicity and response to augmentation. Calf Veins: No evidence of thrombus. Normal compressibility and flow on color Doppler imaging. Superficial Great Saphenous Vein: No evidence of thrombus. Normal compressibility. Venous Reflux:  None. Other Findings:  None.  IMPRESSION: No evidence of deep venous thrombosis in either lower extremity. Baker's cyst right popliteal fossa measuring 2.3 x 0.9 cm. Electronically Signed   By: Bretta Bang III M.D.   On: 07/26/2020 20:03     Scheduled Meds: . aspirin EC  81 mg Oral Daily  . gabapentin  300 mg Oral TID  . heparin  5,000 Units Subcutaneous Q12H  . minocycline  100 mg Oral BID  . naproxen  500 mg Oral BID WC  . nicotine  14 mg Transdermal Daily  . pantoprazole (PROTONIX) IV  40 mg Intravenous Q24H   Continuous Infusions:   LOS: 1 day     Alexandra Priestly, MD [facility] If 7PM-7AM, please contact night-coverage 07/27/2020, 11:29 PM

## 2020-07-28 LAB — CBC
HCT: 34.7 % — ABNORMAL LOW (ref 36.0–46.0)
Hemoglobin: 10.4 g/dL — ABNORMAL LOW (ref 12.0–15.0)
MCH: 23.6 pg — ABNORMAL LOW (ref 26.0–34.0)
MCHC: 30 g/dL (ref 30.0–36.0)
MCV: 78.7 fL — ABNORMAL LOW (ref 80.0–100.0)
Platelets: 532 10*3/uL — ABNORMAL HIGH (ref 150–400)
RBC: 4.41 MIL/uL (ref 3.87–5.11)
RDW: 18.9 % — ABNORMAL HIGH (ref 11.5–15.5)
WBC: 8.7 10*3/uL (ref 4.0–10.5)
nRBC: 0 % (ref 0.0–0.2)

## 2020-07-28 LAB — BASIC METABOLIC PANEL
Anion gap: 5 (ref 5–15)
BUN: 20 mg/dL (ref 6–20)
CO2: 30 mmol/L (ref 22–32)
Calcium: 8.5 mg/dL — ABNORMAL LOW (ref 8.9–10.3)
Chloride: 105 mmol/L (ref 98–111)
Creatinine, Ser: 0.68 mg/dL (ref 0.44–1.00)
GFR, Estimated: >60 mL/min (ref >60)
Glucose, Bld: 97 mg/dL (ref 70–99)
Potassium: 4.2 mmol/L (ref 3.5–5.1)
Sodium: 140 mmol/L (ref 135–145)

## 2020-07-28 LAB — MAGNESIUM: Magnesium: 2 mg/dL (ref 1.7–2.4)

## 2020-07-28 LAB — HIV ANTIBODY (ROUTINE TESTING W REFLEX): HIV Screen 4th Generation wRfx: REACTIVE — AB

## 2020-07-28 LAB — ANA W/REFLEX IF POSITIVE: Anti Nuclear Antibody (ANA): NEGATIVE

## 2020-07-28 MED ORDER — HYDROCODONE-ACETAMINOPHEN 5-325 MG PO TABS
1.0000 | ORAL_TABLET | ORAL | Status: DC | PRN
  Administered 2020-07-28 – 2020-07-30 (×9): 1 via ORAL
  Filled 2020-07-28 (×9): qty 1

## 2020-07-28 MED ORDER — VANCOMYCIN HCL 1250 MG/250ML IV SOLN
1250.0000 mg | INTRAVENOUS | Status: DC
  Administered 2020-07-29: 1250 mg via INTRAVENOUS
  Filled 2020-07-28 (×2): qty 250

## 2020-07-28 MED ORDER — PANTOPRAZOLE SODIUM 40 MG PO TBEC
40.0000 mg | DELAYED_RELEASE_TABLET | Freq: Every day | ORAL | Status: DC
  Administered 2020-07-28 – 2020-07-29 (×2): 40 mg via ORAL
  Filled 2020-07-28 (×2): qty 1

## 2020-07-28 MED ORDER — MUPIROCIN 2 % EX OINT
TOPICAL_OINTMENT | Freq: Two times a day (BID) | CUTANEOUS | Status: DC
  Administered 2020-07-28 – 2020-07-30 (×2): 1 via TOPICAL
  Filled 2020-07-28: qty 22

## 2020-07-28 MED ORDER — VANCOMYCIN HCL 1500 MG/300ML IV SOLN
1500.0000 mg | Freq: Once | INTRAVENOUS | Status: AC
  Administered 2020-07-28: 1500 mg via INTRAVENOUS
  Filled 2020-07-28: qty 300

## 2020-07-28 NOTE — Consult Note (Signed)
Infectious Disease     Reason for Consult: Rash,     Referring Physician: Dr Billie Ruddy Date of Admission:  07/26/2020   Principal Problem:   Rash of both feet Active Problems:   Tobacco use disorder   Cellulitis   Foot pain, bilateral   Rash and nonspecific skin eruption  HPI: Alexandra [name] is a 50 y.o. female admitted May 14 with bilateral foot pain and a rash.  Rash in her feet began about 1 month prior.  It began with blisters and then that scab over her scar.  On admission she had no fever and her white count was 12.1.  She was started on ceftriaxone and minocycline.  Blood cultures have been negative.  Ankle x-ray shows skin and soft tissue irregularity about the lateral aspect of the ankle with circumferential soft tissue edema but no osseous abnormalities.  Doppler ultrasound was negative for DVT.  There was a Baker's cyst on the right 2.3 x 0.9 cm.   CRP  was 1.9.  B12 was normal.  Sed rate was 62.  Thyroid was normal.  Hepatitis a and C were negative.  B is pending.  HIV was reactive with the screen.  Confirmation is pending.  Urinalysis was negative any proteinuria..  Of note she had been seen multiple times in the last 5 months for arthralgias and joint pain.  Most recently she was seen in April 18.  She was treated at that time with a prednisone taper and intends she was referred to dermatology but has not been seen by them.  Rheumatological labs including rheumatoid factor and ANA are pending.  Renal function was normal.  She does have slightly low albumin.  Platelet count is also elevated at 617 and she has mild anemia at 10.9 with an MCV of 77.  Past Medical History:  Diagnosis Date  . Breast discharge 2013   present X 3 years  Left only multiple ducts per pt  . Depression    Past Surgical History:  Procedure Laterality Date  . ABDOMINAL HYSTERECTOMY    . BREAST CYST EXCISION Left 2006   Social History   Tobacco Use  . Smoking status: Current Every Day Smoker    Packs/day:  1.00    Years: 29.00    Pack years: 29.00    Types: Cigarettes  . Smokeless tobacco: Never Used  Substance Use Topics  . Alcohol use: Yes    Alcohol/week: 0.0 standard drinks    Comment: social  . Drug use: No   Family History  Problem Relation Age of Onset  . Breast cancer Mother 38  . Breast cancer Maternal Aunt   . Breast cancer Maternal Grandmother   . Breast cancer Maternal Aunt        Maternal Great Aunt   . Breast cancer Cousin 31    Allergies: No Known Allergies  Current antibiotics: Antibiotics Given (last 72 hours)    Date/Time Action Medication Dose Rate   07/26/20 1837 New Bag/Given   cefTRIAXone (ROCEPHIN) 1 g in sodium chloride 0.9 % 100 mL IVPB 1 g 200 mL/hr   07/26/20 2251 Given   minocycline (MINOCIN) capsule 100 mg 100 mg    07/27/20 1028 Given   minocycline (MINOCIN) capsule 100 mg 100 mg    07/27/20 2117 Given   minocycline (MINOCIN) capsule 100 mg 100 mg       MEDICATIONS: . aspirin EC  81 mg Oral Daily  . enoxaparin (LOVENOX) injection  40 mg Subcutaneous  Q24H  . gabapentin  300 mg Oral TID  . naproxen  500 mg Oral BID WC  . nicotine  14 mg Transdermal Daily  . pantoprazole (PROTONIX) IV  40 mg Intravenous Q24H    Review of Systems - 11 systems reviewed and negative per HPI   OBJECTIVE: Temp:  [98.4 F (36.9 C)-99.2 F (37.3 C)] 98.9 F (37.2 C) (05/16 1146) Pulse Rate:  [80-89] 84 (05/16 1146) Resp:  [15-20] 20 (05/16 1146) BP: (91-109)/(48-60) 105/60 (05/16 1146) SpO2:  [96 %-97 %] 97 % (05/16 1146) Physical Exam  Constitutional:  oriented to person, place, and time. appears well-developed and well-nourished. No distress.  HENT: [place]/AT, PERRLA, no scleral icterus Mouth/Throat: Oropharynx is clear and moist. No oropharyngeal exudate.  Cardiovascular: Normal rate, regular rhythm and normal heart sounds. Exam reveals no gallop and no friction rub.  No murmur heard.  Pulmonary/Chest: Effort normal and breath sounds normal. No  respiratory distress.  has no wheezes.  Neck = supple, no nuchal rigidity Abdominal: Soft. Bowel sounds are normal.  exhibits no distension. There is no tenderness.  Lymphadenopathy: no cervical adenopathy. No axillary adenopathy Neurological: alert and oriented to person, place, and time.  Skin: Skin is warm and dry. Psychiatric: a normal mood and affect.  behavior is normal.         LABS: Results for orders placed or performed during the hospital encounter of 07/26/20 (from the past 48 hour(s))  Lactic acid, plasma     Status: None   Collection Time: 07/26/20  5:39 PM  Result Value Ref Range   Lactic Acid, Venous 0.8 0.5 - 1.9 mmol/L    Comment: Performed at [facility], [address], [place], [place] [address]  Comprehensive metabolic panel     Status: Abnormal   Collection Time: 07/26/20  5:39 PM  Result Value Ref Range   Sodium 139 135 - 145 mmol/L   Potassium 4.3 3.5 - 5.1 mmol/L   Chloride 102 98 - 111 mmol/L   CO2 25 22 - 32 mmol/L   Glucose, Bld 88 70 - 99 mg/dL    Comment: Glucose reference range applies only to samples taken after fasting for at least 8 hours.   BUN 16 6 - 20 mg/dL   Creatinine, Ser 0.70 0.44 - 1.00 mg/dL   Calcium 9.1 8.9 - 10.3 mg/dL   Total Protein 7.3 6.5 - 8.1 g/dL   Albumin 3.2 (L) 3.5 - 5.0 g/dL   AST 14 (L) 15 - 41 U/L   ALT 12 0 - 44 U/L   Alkaline Phosphatase 71 38 - 126 U/L   Total Bilirubin 0.3 0.3 - 1.2 mg/dL   GFR, Estimated >60 >60 mL/min    Comment: (NOTE) Calculated using the CKD-EPI Creatinine Equation (2021)    Anion gap 12 5 - 15    Comment: Performed at [facility], [address] [place], [place] [address]  CBC with Differential     Status: Abnormal   Collection Time: 07/26/20  5:39 PM  Result Value Ref Range   WBC 12.1 (H) 4.0 - 10.5 K/uL   RBC 4.64 3.87 - 5.11 MIL/uL   Hemoglobin 10.9 (L) 12.0 - 15.0 g/dL   HCT 35.8 (L) 36.0 - 46.0 %   MCV 77.2 (L) 80.0 - 100.0 fL   MCH 23.5 (L) 26.0 -  34.0 pg   MCHC 30.4 30.0 - 36.0 g/dL   RDW 19.0 (H) 11.5 - 15.5 %   Platelets 617 (H)  150 - 400 K/uL   nRBC 0.0 0.0 - 0.2 %   Neutrophils Relative % 61 %   Neutro Abs 7.4 1.7 - 7.7 K/uL   Lymphocytes Relative 24 %   Lymphs Abs 2.9 0.7 - 4.0 K/uL   Monocytes Relative 9 %   Monocytes Absolute 1.0 0.1 - 1.0 K/uL   Eosinophils Relative 5 %   Eosinophils Absolute 0.6 (H) 0.0 - 0.5 K/uL   Basophils Relative 1 %   Basophils Absolute 0.1 0.0 - 0.1 K/uL   Immature Granulocytes 0 %   Abs Immature Granulocytes 0.05 0.00 - 0.07 K/uL    Comment: Performed at [facility], [address] [place], [place] [address]  Sedimentation rate     Status: Abnormal   Collection Time: 07/26/20  6:25 PM  Result Value Ref Range   Sed Rate 62 (H) 0 - 30 mm/hr    Comment: Performed at [facility], [address] [place], [place] [address]  Blood culture (routine x 2)     Status: None (Preliminary result)   Collection Time: 07/26/20  6:35 PM   Specimen: BLOOD  Result Value Ref Range   Specimen Description BLOOD  LEFT AC    Special Requests      BOTTLES DRAWN AEROBIC AND ANAEROBIC Blood Culture results may not be optimal due to an inadequate volume of blood received in culture bottles   Culture      NO GROWTH 2 DAYS Performed at [facility], [address] [place], [place] [address]    Report Status PENDING   Blood culture (routine x 2)     Status: None (Preliminary result)   Collection Time: 07/26/20  6:36 PM   Specimen: BLOOD  Result Value Ref Range   Specimen Description BLOOD  RIGHT AC    Special Requests      BOTTLES DRAWN AEROBIC AND ANAEROBIC Blood Culture adequate volume   Culture      NO GROWTH 2 DAYS Performed at [facility], [address] [place], [place] [address]    Report Status PENDING   Urinalysis, Complete w Microscopic Urine, Clean Catch     Status: Abnormal   Collection Time: 07/26/20  8:51 PM  Result Value Ref Range   Color, Urine  YELLOW (A) YELLOW   APPearance HAZY (A) CLEAR   Specific Gravity, Urine 1.014 1.005 - 1.030   pH 5.0 5.0 - 8.0   Glucose, UA NEGATIVE NEGATIVE mg/dL   Hgb urine dipstick NEGATIVE NEGATIVE   Bilirubin Urine NEGATIVE NEGATIVE   Ketones, ur NEGATIVE NEGATIVE mg/dL   Protein, ur NEGATIVE NEGATIVE mg/dL   Nitrite NEGATIVE NEGATIVE   Leukocytes,Ua NEGATIVE NEGATIVE   RBC / HPF 0-5 0 - 5 RBC/hpf   WBC, UA 6-10 0 - 5 WBC/hpf   Bacteria, UA NONE SEEN NONE SEEN   Squamous Epithelial / LPF 6-10 0 - 5   Mucus PRESENT     Comment: Performed at [facility], [address] [place], [place] [address]  C-reactive protein     Status: Abnormal   Collection Time: 07/26/20 10:09 PM  Result Value Ref Range   CRP 1.9 (H) <1.0 mg/dL    Comment: Performed at [facility], [address] [place], [place] [address]  Hepatitis panel, acute     Status: None (Preliminary result)   Collection Time: 07/26/20 10:09 PM  Result Value Ref Range   Hepatitis B Surface Ag PENDING NON REACTIVE  HCV Ab NON REACTIVE NON REACTIVE    Comment: (NOTE) Nonreactive HCV antibody screen is consistent with no HCV infections,  unless recent infection is suspected or other evidence exists to indicate HCV infection.     Hep A IgM NON REACTIVE NON REACTIVE   Hep B C IgM NON REACTIVE NON REACTIVE    Comment: Performed at [facility], [place] [address], [place], [place] [address]  Vitamin B12     Status: None   Collection Time: 07/26/20 10:53 PM  Result Value Ref Range   Vitamin B-12 781 180 - 914 pg/mL    Comment: (NOTE) This assay is not validated for testing neonatal or myeloproliferative syndrome specimens for Vitamin B12 levels. Performed at [facility], [place] [address] [place], [place] [address]   Folate     Status: None   Collection Time: 07/26/20 10:53 PM  Result Value Ref Range   Folate 7.7 >5.9 ng/mL    Comment: Performed at [facility], [place],  [place] [address]  Iron and TIBC     Status: Abnormal   Collection Time: 07/26/20 10:53 PM  Result Value Ref Range   Iron 18 (L) 28 - 170 ug/dL   TIBC 357 250 - 450 ug/dL   Saturation Ratios 5 (L) 10.4 - 31.8 %   UIBC 339 ug/dL    Comment: Performed at [facility], [address] [place], [place] [address]  Ferritin     Status: Abnormal   Collection Time: 07/26/20 10:53 PM  Result Value Ref Range   Ferritin 9 (L) 11 - 307 ng/mL    Comment: Performed at [facility], [address] [place], [place] [address]  Reticulocytes     Status: Abnormal   Collection Time: 07/26/20 10:53 PM  Result Value Ref Range   Retic Ct Pct 1.9 0.4 - 3.1 %   RBC. 4.34 3.87 - 5.11 MIL/uL   Retic Count, Absolute 83.3 19.0 - 186.0 K/uL   Immature Retic Fract 24.5 (H) 2.3 - 15.9 %    Comment: Performed at [facility], [place], [place] [address]  HIV Antibody (routine testing w rflx)     Status: Abnormal   Collection Time: 07/26/20 10:53 PM  Result Value Ref Range   HIV Screen 4th Generation wRfx Reactive (A) Non Reactive    Comment: (NOTE) Reactive result does not distinguish HIV-1 p24 antigen, HIV-1  antibody, HIV-2 antibody, and HIV-1 group O antibody. Results  reactive by HIV Antigen/Antibody EIA must be confirmed. Sent for  confirmation. Performed at [facility], [place] [address] [place], [place] [address]   TSH     Status: None   Collection Time: 07/26/20 10:53 PM  Result Value Ref Range   TSH 3.498 0.350 - 4.500 uIU/mL    Comment: Performed by a 3rd Generation assay with a functional sensitivity of <=0.01 uIU/mL. Performed at [facility], [address] [place], [place] [address]   T4, free     Status: None   Collection Time: 07/26/20 10:53 PM  Result Value Ref Range   Free T4 1.00 0.61 - 1.12 ng/dL    Comment: (NOTE) Biotin ingestion may interfere with free T4 tests. If the results are inconsistent with the TSH level, previous test  results, or the clinical presentation, then consider biotin interference. If needed, order repeat testing after stopping biotin. Performed at [facility], [address] [place], [place] [address]  SARS CORONAVIRUS 2 (TAT 6-24 HRS) Nasopharyngeal Nasopharyngeal Swab     Status: None   Collection Time: 07/26/20 11:47 PM   Specimen: Nasopharyngeal Swab  Result Value Ref Range   SARS Coronavirus 2 NEGATIVE NEGATIVE    Comment: (NOTE) SARS-CoV-2 target nucleic acids are NOT DETECTED.  The SARS-CoV-2 RNA is generally detectable in upper and lower respiratory specimens during the acute phase of infection. Negative results do not preclude SARS-CoV-2 infection, do not rule out co-infections with other pathogens, and should not be used as the sole basis for treatment or other patient management decisions. Negative results must be combined with clinical observations, patient history, and epidemiological information. The expected result is Negative.  Fact Sheet for Patients: HairSlick.no  Fact Sheet for Healthcare Providers: quierodirigir.com  This test is not yet approved or cleared by the [place] FDA and  has been authorized for detection and/or diagnosis of SARS-CoV-2 by FDA under an Emergency Use Authorization (EUA). This EUA will remain  in effect (meaning this test can be used) for the duration of the COVID-19 declaration under Se ction 564(b)(1) of the Act, 21 U.S.C. section 360bbb-3(b)(1), unless the authorization is terminated or revoked sooner.  Performed at [facility], [address], [place], [place] [address]   Basic metabolic panel     Status: Abnormal   Collection Time: 07/28/20  4:55 AM  Result Value Ref Range   Sodium 140 135 - 145 mmol/L   Potassium 4.2 3.5 - 5.1 mmol/L   Chloride 105 98 - 111 mmol/L   CO2 30 22 - 32 mmol/L   Glucose, Bld 97 70 - 99 mg/dL    Comment: Glucose  reference range applies only to samples taken after fasting for at least 8 hours.   BUN 20 6 - 20 mg/dL   Creatinine, Ser 6.66 0.44 - 1.00 mg/dL   Calcium 8.5 (L) 8.9 - 10.3 mg/dL   GFR, Estimated >28 >83 mL/min    Comment: (NOTE) Calculated using the CKD-EPI Creatinine Equation (2021)    Anion gap 5 5 - 15    Comment: Performed at [facility], [address] [place], [place] [address]  CBC     Status: Abnormal   Collection Time: 07/28/20  4:55 AM  Result Value Ref Range   WBC 8.7 4.0 - 10.5 K/uL   RBC 4.41 3.87 - 5.11 MIL/uL   Hemoglobin 10.4 (L) 12.0 - 15.0 g/dL   HCT 95.7 (L) 08.1 - 64.6 %   MCV 78.7 (L) 80.0 - 100.0 fL   MCH 23.6 (L) 26.0 - 34.0 pg   MCHC 30.0 30.0 - 36.0 g/dL   RDW 72.5 (H) 91.2 - 64.4 %   Platelets 532 (H) 150 - 400 K/uL   nRBC 0.0 0.0 - 0.2 %    Comment: Performed at [facility], [address], [place], [place] [address]  Magnesium     Status: None   Collection Time: 07/28/20  4:55 AM  Result Value Ref Range   Magnesium 2.0 1.7 - 2.4 mg/dL    Comment: Performed at [facility], [address] [place], [place] [address]   No components found for: ESR, C REACTIVE PROTEIN MICRO: Recent Results (from the past 720 hour(s))  Blood culture (routine x 2)     Status: None (Preliminary result)   Collection Time: 07/26/20  6:35 PM   Specimen: BLOOD  Result Value Ref Range Status   Specimen Description BLOOD  LEFT AC  Final   Special Requests   Final    BOTTLES DRAWN AEROBIC AND ANAEROBIC Blood Culture results may not be optimal due to an inadequate volume of blood received in culture bottles   Culture   Final    NO GROWTH 2 DAYS Performed at [facility], [address] [place], [place] [address]    Report Status PENDING  Incomplete  Blood culture (routine x 2)     Status: None (Preliminary result)   Collection Time: 07/26/20  6:36 PM   Specimen: BLOOD  Result Value Ref Range Status   Specimen Description  BLOOD  RIGHT [facility]  Final   Special Requests   Final    BOTTLES DRAWN AEROBIC AND ANAEROBIC Blood Culture adequate volume   Culture   Final    NO GROWTH 2 DAYS Performed at [facility], [place], [place] [address]    Report Status PENDING  Incomplete  SARS CORONAVIRUS 2 (TAT 6-24 HRS) Nasopharyngeal Nasopharyngeal Swab     Status: None   Collection Time: 07/26/20 11:47 PM   Specimen: Nasopharyngeal Swab  Result Value Ref Range Status   SARS Coronavirus 2 NEGATIVE NEGATIVE Final    Comment: (NOTE) SARS-CoV-2 target nucleic acids are NOT DETECTED.  The SARS-CoV-2 RNA is generally detectable in upper and lower respiratory specimens during the acute phase of infection. Negative results do not preclude SARS-CoV-2 infection, do not rule out co-infections with other pathogens, and should not be used as the sole basis for treatment or other patient management decisions. Negative results must be combined with clinical observations, patient history, and epidemiological information. The expected result is Negative.  Fact Sheet for Patients: SugarRoll.be  Fact Sheet for Healthcare Providers: https://www.woods-mathews.com/  This test is not yet approved or cleared by the [place] FDA and  has been authorized for detection and/or diagnosis of SARS-CoV-2 by FDA under an Emergency Use Authorization (EUA). This EUA will remain  in effect (meaning this test can be used) for the duration of the COVID-19 declaration under Se ction 564(b)(1) of the Act, 21 U.S.C. section 360bbb-3(b)(1), unless the authorization is terminated or revoked sooner.  Performed at [facility], [place] [address], [place], [place] [address]     IMAGING: DG Ankle Complete Right  Result Date: 07/26/2020 CLINICAL DATA:  Pain, swelling, lesions. EXAM: RIGHT ANKLE - COMPLETE 3+ VIEW COMPARISON:  Ankle radiograph 06/30/2020 FINDINGS: There is no  evidence of fracture or dislocation. There is no evidence of arthropathy or other focal bone abnormality. Tiny Achilles tendon enthesophyte. Skin and soft tissue irregularity about the lateral aspect of the ankle. No tracking soft tissue air. No radiopaque foreign body. Circumferential soft tissue edema. IMPRESSION: 1. Skin and soft tissue irregularity about the lateral aspect of the ankle. Circumferential soft tissue edema. 2. No osseous abnormalities. Electronically Signed   By: Keith Rake M.D.   On: 07/26/2020 19:14   DG Ankle Complete Right  Result Date: 06/30/2020 CLINICAL DATA:  Pain without injury. EXAM: RIGHT ANKLE - COMPLETE 3+ VIEW COMPARISON:  May 01, 2020. FINDINGS: There is no evidence of fracture, dislocation, or joint effusion. There is no evidence of arthropathy or other focal bone abnormality. Calcaneal enthesophyte. Soft tissues are unremarkable. IMPRESSION: Negative. Electronically Signed   By: Margaretha [place] MD   On: 06/30/2020 09:14   US Venous Img Lower Bilateral  Result Date: 07/26/2020 CLINICAL DATA:  Bilateral lower extremity pain and edema EXAM: BILATERAL LOWER EXTREMITY VENOUS DUPLEX ULTRASOUND TECHNIQUE:  Gray-scale sonography with graded compression, as well as color Doppler and duplex ultrasound were performed to evaluate the lower extremity deep venous systems from the level of the common femoral vein and including the common femoral, femoral, profunda femoral, popliteal and calf veins including the posterior tibial, peroneal and gastrocnemius veins when visible. The superficial great saphenous vein was also interrogated. Spectral Doppler was utilized to evaluate flow at rest and with distal augmentation maneuvers in the common femoral, femoral and popliteal veins. COMPARISON:  None. FINDINGS: RIGHT LOWER EXTREMITY Common Femoral Vein: No evidence of thrombus. Normal compressibility, respiratory phasicity and response to augmentation. Saphenofemoral Junction: No  evidence of thrombus. Normal compressibility and flow on color Doppler imaging. Profunda Femoral Vein: No evidence of thrombus. Normal compressibility and flow on color Doppler imaging. Femoral Vein: No evidence of thrombus. Normal compressibility, respiratory phasicity and response to augmentation. Popliteal Vein: No evidence of thrombus. Normal compressibility, respiratory phasicity and response to augmentation. Calf Veins: No evidence of thrombus. Normal compressibility and flow on color Doppler imaging. Superficial Great Saphenous Vein: No evidence of thrombus. Normal compressibility. Venous Reflux:  None. Other Findings: There is a popliteal/Baker's cyst in the right popliteal fossa measuring 2.3 x 0.9 cm. LEFT LOWER EXTREMITY Common Femoral Vein: No evidence of thrombus. Normal compressibility, respiratory phasicity and response to augmentation. Saphenofemoral Junction: No evidence of thrombus. Normal compressibility and flow on color Doppler imaging. Profunda Femoral Vein: No evidence of thrombus. Normal compressibility and flow on color Doppler imaging. Femoral Vein: No evidence of thrombus. Normal compressibility, respiratory phasicity and response to augmentation. Popliteal Vein: No evidence of thrombus. Normal compressibility, respiratory phasicity and response to augmentation. Calf Veins: No evidence of thrombus. Normal compressibility and flow on color Doppler imaging. Superficial Great Saphenous Vein: No evidence of thrombus. Normal compressibility. Venous Reflux:  None. Other Findings:  None. IMPRESSION: No evidence of deep venous thrombosis in either lower extremity. Baker's cyst right popliteal fossa measuring 2.3 x 0.9 cm. Electronically Signed   By: Lowella Grip III M.D.   On: 07/26/2020 20:03    Assessment:   ANONA GIOVANNINI is a 50 y.o. female relatively healthy female who now has over a month of lower extremity wounds around her bilateral ankles.  She has had multiple ER visits for  arthralgias as well but reports those are improving.  She has not really received any antibiotics but was treated with Valtrex.  She is received a prednisone course.  As an outpatient she was referred to rheumatology but has not seen them yet. She has no fever.  White count is normal.  Moderate elevations in her sed rate and CRP currently but her sed rate was all the way up to 90 initially.  HIV screening test is positive but this may be false positive.  Follow-up confirmatory test is pending.  She does have an elevated platelet count consistently which can be an acute phase reactant. Differential diagnosis is broad.  This could just be impetigo given how it spread from one leg to the other and is pruritic and she reports has a yellow drainage when she scratches on it.  However vasculitis is certainly in the differential.  She is a smoker so Buerger's disease could be a possibility.  She will need a full rheumatological work-up.   Recommendations I have cultured the wound. We will start vancomycin and culture results Bactroban ointment to the wound twice a day. Rheumatological tests including rheumatoid factor ANA are pending.  I would suggest seeing if rheumatology  can see her as an inpatient.  She likely needs testing with complement, ANCA, cryoglobulins, antiphospholipid antibodies and full work-up. We will await confirmatory HIV test prior to further testing Thank you very much for allowing me to participate in the care of this patient. Please call with questions.   Alexandra [name]. Ola Spurr, MD

## 2020-07-28 NOTE — Progress Notes (Signed)
PROGRESS NOTE    Alexandra [name]  YNW:[id number] DOB: 07/26/1970 DOA: 07/26/2020 PCP: [facility], [facility]  141A/141A-AA   Assessment & Plan:   Principal Problem:   Rash of both feet Active Problems:   Tobacco use disorder   Cellulitis   Foot pain, bilateral   Rash and nonspecific skin eruption   Alexandra [name] is a 50 y.o. female seen in ed with complaints of bl foot pain.  rash on both feet started one at a time. Since about a month ago , Alexandra [name] started to have rt foot rash that was itchy and then it formed a blister and yellow pus in it and then it turns into a crusty appearing rash with granulation and necrotic appearance.  it has progressed from rt foot to left foot and for over a months now. And Alexandra [name] has been wearing open shoes, and has been using aleve and was given steroids it did not help. Also gave gabapentin and has not helped pain.   Rash and wound over bilateral ankles --based on the hx and symptoms, query shingles?   --Had received steroid as outpatient.  Was prescribed Valtrex also, but Alexandra [name] unsure if she took it. Plan: --naproxen, gabapentin scheduled for pain --Norco PRN --ID consult today  Tobacco abuse: --nicotine patch   DVT prophylaxis: Lovenox SQ Code Status: Full code  Family Communication:  Level of care: Med-Surg Dispo:   The patient is from: home Anticipated d/c is to: home Anticipated d/c date is: 1-2 days Patient currently is not medically ready to d/c due to: pending ID consult   Subjective and Interval History:  Alexandra [name] continued to complain of severe pain in her ankles and feet.  Reported some drainage from her ankle wounds.     Objective: Vitals:   07/28/20 0511 07/28/20 0748 07/28/20 1146 07/28/20 1613  BP: (!) 91/48 104/60 105/60 103/60  Pulse: 82 80 84 97  Resp: 17 18 20 18   Temp: 98.4 F (36.9 C) 98.6 F (37 C) 98.9 F (37.2 C) 98.2 F (36.8 C)  TempSrc:  Oral    SpO2: 96% 97% 97% 99%  Weight:      Height:         Intake/Output Summary (Last 24 hours) at 07/28/2020 1701 Last data filed at 07/28/2020 1407 Gross per 24 hour  Intake 600 ml  Output --  Net 600 ml   Filed Weights   07/26/20 1737  Weight: 62.1 kg    Examination:   Constitutional: NAD, AAOx3 HEENT: conjunctivae and lids normal, EOMI CV: No cyanosis.   RESP: normal respiratory effort, on RA Extremities: No effusions, edema in BLE SKIN: warm.  Small amount of drainage seeping from the scabs from right lateral ankle Neuro: II - XII grossly intact.   Psych: depressed mood and affect.  Appropriate judgement and reason   07/26/20       Data Reviewed: I have personally reviewed following labs and imaging studies  CBC: Recent Labs  Lab 07/26/20 1739 07/28/20 0455  WBC 12.1* 8.7  NEUTROABS 7.4  --   HGB 10.9* 10.4*  HCT 35.8* 34.7*  MCV 77.2* 78.7*  PLT 617* 532*   Basic Metabolic Panel: Recent Labs  Lab 07/26/20 1739 07/28/20 0455  NA 139 140  K 4.3 4.2  CL 102 105  CO2 25 30  GLUCOSE 88 97  BUN 16 20  CREATININE 0.70 0.68  CALCIUM 9.1 8.5*  MG  --  2.0   GFR: Estimated Creatinine Clearance:  72.1 mL/min (by C-G formula based on SCr of 0.68 mg/dL). Liver Function Tests: Recent Labs  Lab 07/26/20 1739  AST 14*  ALT 12  ALKPHOS 71  BILITOT 0.3  PROT 7.3  ALBUMIN 3.2*   No results for input(s): LIPASE, AMYLASE in the last 168 hours. No results for input(s): AMMONIA in the last 168 hours. Coagulation Profile: No results for input(s): INR, PROTIME in the last 168 hours. Cardiac Enzymes: No results for input(s): CKTOTAL, CKMB, CKMBINDEX, TROPONINI in the last 168 hours. BNP (last 3 results) No results for input(s): PROBNP in the last 8760 hours. HbA1C: No results for input(s): HGBA1C in the last 72 hours. CBG: No results for input(s): GLUCAP in the last 168 hours. Lipid Profile: No results for input(s): CHOL, HDL, LDLCALC, TRIG, CHOLHDL, LDLDIRECT in the last 72 hours. Thyroid Function  Tests: Recent Labs    07/26/20 2253  TSH 3.498  FREET4 1.00   Anemia Panel: Recent Labs    07/26/20 2253  VITAMINB12 781  FOLATE 7.7  FERRITIN 9*  TIBC 357  IRON 18*  RETICCTPCT 1.9   Sepsis Labs: Recent Labs  Lab 07/26/20 1739  LATICACIDVEN 0.8    Recent Results (from the past 240 hour(s))  Blood culture (routine x 2)     Status: None (Preliminary result)   Collection Time: 07/26/20  6:35 PM   Specimen: BLOOD  Result Value Ref Range Status   Specimen Description BLOOD  LEFT AC  Final   Special Requests   Final    BOTTLES DRAWN AEROBIC AND ANAEROBIC Blood Culture results may not be optimal due to an inadequate volume of blood received in culture bottles   Culture   Final    NO GROWTH 2 DAYS Performed at [facility], [address] [place], [place] [address]    Report Status PENDING  Incomplete  Blood culture (routine x 2)     Status: None (Preliminary result)   Collection Time: 07/26/20  6:36 PM   Specimen: BLOOD  Result Value Ref Range Status   Specimen Description BLOOD  RIGHT [facility]  Final   Special Requests   Final    BOTTLES DRAWN AEROBIC AND ANAEROBIC Blood Culture adequate volume   Culture   Final    NO GROWTH 2 DAYS Performed at [facility], [address] [place], [place] [address]    Report Status PENDING  Incomplete  SARS CORONAVIRUS 2 (TAT 6-24 HRS) Nasopharyngeal Nasopharyngeal Swab     Status: None   Collection Time: 07/26/20 11:47 PM   Specimen: Nasopharyngeal Swab  Result Value Ref Range Status   SARS Coronavirus 2 NEGATIVE NEGATIVE Final    Comment: (NOTE) SARS-CoV-2 target nucleic acids are NOT DETECTED.  The SARS-CoV-2 RNA is generally detectable in upper and lower respiratory specimens during the acute phase of infection. Negative results do not preclude SARS-CoV-2 infection, do not rule out co-infections with other pathogens, and should not be used as the sole basis for treatment or other patient management  decisions. Negative results must be combined with clinical observations, patient history, and epidemiological information. The expected result is Negative.  Fact Sheet for Patients: HairSlick.nohttps://www.fda.gov/media/138098/download  Fact Sheet for Healthcare Providers: quierodirigir.comhttps://www.fda.gov/media/138095/download  This test is not yet approved or cleared by the [place] FDA and  has been authorized for detection and/or diagnosis of SARS-CoV-2 by FDA under an Emergency Use Authorization (EUA). This EUA will remain  in effect (meaning this test can be used) for the duration of the  COVID-19 declaration under Se ction 564(b)(1) of the Act, 21 U.S.C. section 360bbb-3(b)(1), unless the authorization is terminated or revoked sooner.  Performed at [facility], [address] [place], [place] [address]       Radiology Studies: DG Ankle Complete Right  Result Date: 07/26/2020 CLINICAL DATA:  Pain, swelling, lesions. EXAM: RIGHT ANKLE - COMPLETE 3+ VIEW COMPARISON:  Ankle radiograph 06/30/2020 FINDINGS: There is no evidence of fracture or dislocation. There is no evidence of arthropathy or other focal bone abnormality. Tiny Achilles tendon enthesophyte. Skin and soft tissue irregularity about the lateral aspect of the ankle. No tracking soft tissue air. No radiopaque foreign body. Circumferential soft tissue edema. IMPRESSION: 1. Skin and soft tissue irregularity about the lateral aspect of the ankle. Circumferential soft tissue edema. 2. No osseous abnormalities. Electronically Signed   By: Narda Rutherford M.D.   On: 07/26/2020 19:14   US Venous Img Lower Bilateral  Result Date: 07/26/2020 CLINICAL DATA:  Bilateral lower extremity pain and edema EXAM: BILATERAL LOWER EXTREMITY VENOUS DUPLEX ULTRASOUND TECHNIQUE: Gray-scale sonography with graded compression, as well as color Doppler and duplex ultrasound were performed to evaluate the lower extremity deep venous systems from the level of the  common femoral vein and including the common femoral, femoral, profunda femoral, popliteal and calf veins including the posterior tibial, peroneal and gastrocnemius veins when visible. The superficial great saphenous vein was also interrogated. Spectral Doppler was utilized to evaluate flow at rest and with distal augmentation maneuvers in the common femoral, femoral and popliteal veins. COMPARISON:  None. FINDINGS: RIGHT LOWER EXTREMITY Common Femoral Vein: No evidence of thrombus. Normal compressibility, respiratory phasicity and response to augmentation. Saphenofemoral Junction: No evidence of thrombus. Normal compressibility and flow on color Doppler imaging. Profunda Femoral Vein: No evidence of thrombus. Normal compressibility and flow on color Doppler imaging. Femoral Vein: No evidence of thrombus. Normal compressibility, respiratory phasicity and response to augmentation. Popliteal Vein: No evidence of thrombus. Normal compressibility, respiratory phasicity and response to augmentation. Calf Veins: No evidence of thrombus. Normal compressibility and flow on color Doppler imaging. Superficial Great Saphenous Vein: No evidence of thrombus. Normal compressibility. Venous Reflux:  None. Other Findings: There is a popliteal/Baker's cyst in the right popliteal fossa measuring 2.3 x 0.9 cm. LEFT LOWER EXTREMITY Common Femoral Vein: No evidence of thrombus. Normal compressibility, respiratory phasicity and response to augmentation. Saphenofemoral Junction: No evidence of thrombus. Normal compressibility and flow on color Doppler imaging. Profunda Femoral Vein: No evidence of thrombus. Normal compressibility and flow on color Doppler imaging. Femoral Vein: No evidence of thrombus. Normal compressibility, respiratory phasicity and response to augmentation. Popliteal Vein: No evidence of thrombus. Normal compressibility, respiratory phasicity and response to augmentation. Calf Veins: No evidence of thrombus. Normal  compressibility and flow on color Doppler imaging. Superficial Great Saphenous Vein: No evidence of thrombus. Normal compressibility. Venous Reflux:  None. Other Findings:  None. IMPRESSION: No evidence of deep venous thrombosis in either lower extremity. Baker's cyst right popliteal fossa measuring 2.3 x 0.9 cm. Electronically Signed   By: Bretta Bang III M.D.   On: 07/26/2020 20:03     Scheduled Meds: . aspirin EC  81 mg Oral Daily  . enoxaparin (LOVENOX) injection  40 mg Subcutaneous Q24H  . gabapentin  300 mg Oral TID  . mupirocin ointment   Topical BID  . naproxen  500 mg Oral BID WC  . nicotine  14 mg Transdermal Daily  . pantoprazole  40 mg Oral QHS  Continuous Infusions:   LOS: 2 days     Darlin Priestly, MD [facility] If 7PM-7AM, please contact night-coverage 07/28/2020, 5:01 PM

## 2020-07-28 NOTE — Progress Notes (Signed)
PHARMACIST - PHYSICIAN COMMUNICATION  DR: Fran Lowes  CONCERNING: IV to Oral Route Change Policy  RECOMMENDATION: This patient is receiving pantoprazole by the intravenous route.  Based on criteria approved by the Pharmacy and Therapeutics Committee, the intravenous medication(s) is/are being converted to the equivalent oral dose form(s).   DESCRIPTION: These criteria include:  The patient is eating (either orally or via tube) and/or has been taking other orally administered medications for a least 24 hours  The patient has no evidence of active gastrointestinal bleeding or impaired GI absorption (gastrectomy, short bowel, patient on TNA or NPO).  If you have questions about this conversion, please contact the Pharmacy Department  []   [phone] )  ( [phone] [x]   [phone] )  [facility] []   [phone] )  [facility] []   [phone] )  [facility] []   [phone] )  [facility]   ( [phone], [facility] 07/28/2020 2:31 PM

## 2020-07-28 NOTE — Plan of Care (Signed)

## 2020-07-28 NOTE — Consult Note (Signed)
Pharmacy Antibiotic Note  Alexandra [name] is a 50 y.o. female admitted on 07/26/2020 with wound infection.  Pharmacy has been consulted for vancomycin dosing. ID following.   Plan: Will give vancomycin 1500 mg loading dose followed by 1250 mg q24H for a predicted AUC of 473. Scr used 0.8. Goal AUC 400-550. Plan to order vancomycin level prior to the 4th-5th dose.   Height: 5\' 2"  (157.5 cm) Weight: 62.1 kg (137 lb) IBW/kg (Calculated) : 50.1  Temp (24hrs), Avg:98.5 F (36.9 C), Min:98.2 F (36.8 C), Max:98.9 F (37.2 C)  Recent Labs  Lab 07/26/20 1739 07/28/20 0455  WBC 12.1* 8.7  CREATININE 0.70 0.68  LATICACIDVEN 0.8  --     Estimated Creatinine Clearance: 72.1 mL/min (by C-G formula based on SCr of 0.68 mg/dL).    No Known Allergies  Antimicrobials this admission: 5/16 vancomycin >>  Dose adjustments this admission: None  Microbiology results: 5/14/ BCx: pending  Thank you for allowing pharmacy to be a part of this patient's care.  04-[birth year]-1986 07/28/2020 4:59 PM

## 2020-07-29 LAB — HIV-1 RNA QUANT-NO REFLEX-BLD
HIV 1 RNA Quant: <20 copies/mL
LOG10 HIV-1 RNA: UNDETERMINED log10copy/mL

## 2020-07-29 LAB — CBC
HCT: 36.1 % (ref 36.0–46.0)
Hemoglobin: 10.8 g/dL — ABNORMAL LOW (ref 12.0–15.0)
MCH: 23.5 pg — ABNORMAL LOW (ref 26.0–34.0)
MCHC: 29.9 g/dL — ABNORMAL LOW (ref 30.0–36.0)
MCV: 78.5 fL — ABNORMAL LOW (ref 80.0–100.0)
Platelets: 552 10*3/uL — ABNORMAL HIGH (ref 150–400)
RBC: 4.6 MIL/uL (ref 3.87–5.11)
RDW: 18.9 % — ABNORMAL HIGH (ref 11.5–15.5)
WBC: 7.7 10*3/uL (ref 4.0–10.5)
nRBC: 0 % (ref 0.0–0.2)

## 2020-07-29 LAB — BASIC METABOLIC PANEL
Anion gap: 7 (ref 5–15)
BUN: 14 mg/dL (ref 6–20)
CO2: 27 mmol/L (ref 22–32)
Calcium: 8.7 mg/dL — ABNORMAL LOW (ref 8.9–10.3)
Chloride: 102 mmol/L (ref 98–111)
Creatinine, Ser: 0.63 mg/dL (ref 0.44–1.00)
GFR, Estimated: >60 mL/min (ref >60)
Glucose, Bld: 97 mg/dL (ref 70–99)
Potassium: 4.2 mmol/L (ref 3.5–5.1)
Sodium: 136 mmol/L (ref 135–145)

## 2020-07-29 LAB — MAGNESIUM: Magnesium: 2 mg/dL (ref 1.7–2.4)

## 2020-07-29 MED ORDER — POLYETHYLENE GLYCOL 3350 17 G PO PACK
34.0000 g | PACK | Freq: Two times a day (BID) | ORAL | Status: DC
  Administered 2020-07-29 – 2020-07-30 (×3): 34 g via ORAL
  Filled 2020-07-29 (×3): qty 2

## 2020-07-29 MED ORDER — BUPROPION HCL 100 MG PO TABS
100.0000 mg | ORAL_TABLET | Freq: Two times a day (BID) | ORAL | Status: DC
  Administered 2020-07-29 – 2020-07-30 (×3): 100 mg via ORAL
  Filled 2020-07-29 (×5): qty 1

## 2020-07-29 MED ORDER — ARIPIPRAZOLE 2 MG PO TABS
2.0000 mg | ORAL_TABLET | Freq: Every day | ORAL | Status: DC
  Administered 2020-07-29 – 2020-07-30 (×2): 2 mg via ORAL
  Filled 2020-07-29 (×2): qty 1

## 2020-07-29 NOTE — TOC Progression Note (Signed)
Transition of Care [facility]) - Progression Note    Patient Details  Name: Alexandra [name] MRN: [id number] Date of Birth: 04-27-1970  Transition of Care [facility]) CM/SW [place], RN Phone Number: 07/29/2020, 10:30 AM  Clinical Narrative:    Met with the patient in the room to discuss DC plan and needs She lives at home with her daughter, she stated that she is independent and has no needs at this time, if she determines a need she will let me know       Expected Discharge Plan and Services                                                 Social Determinants of Health (SDOH) Interventions    Readmission Risk Interventions No flowsheet data found.

## 2020-07-29 NOTE — Progress Notes (Signed)
PROGRESS NOTE    Alexandra [name]  EYC:[id number] DOB: 01-22-1971 DOA: 07/26/2020 PCP: [facility], [facility]  141A/141A-AA   Assessment & Plan:   Principal Problem:   Rash of both feet Active Problems:   Tobacco use disorder   Cellulitis   Foot pain, bilateral   Rash and nonspecific skin eruption   Alexandra [name] is a 50 y.o. female seen in ed with complaints of bl foot pain.  rash on both feet started one at a time. Since about a month ago , pt started to have rt foot rash that was itchy and then it formed a blister and yellow pus in it and then it turns into a crusty appearing rash with granulation and necrotic appearance.  it has progressed from rt foot to left foot and for over a months now. And pt has been wearing open shoes, and has been using aleve and was given steroids it did not help. Also gave gabapentin and has not helped pain.   Rash and wound over bilateral ankles --based on the hx and symptoms, query shingles?   --Had received steroid as outpatient.  Was prescribed Valtrex also, but pt unsure if she took it. --ID consulted and started IV vanc for possible impetigo  Plan: --cont IV vanc, per ID --cont mupirocin ointment BID, per ID --MRI ankle to assess for osteomyelitis --naproxen, gabapentin scheduled for pain --Norco PRN --May need skin biopsy  Tobacco abuse: --nicotine patch  Depression --cont Abilify and Wellbutrin  GERD --cont PPI  Microcytic Anemia, iron def --anemia workup showed iron def --iron suppl at dischargbe   DVT prophylaxis: Lovenox SQ Code Status: Full code  Family Communication:  Level of care: Med-Surg Dispo:   The patient is from: home Anticipated d/c is to: home Anticipated d/c date is: 2-3 days Patient currently is not medically ready to d/c due to: pending ID workup   Subjective and Interval History:  Still reported significant pain in right ankle, and difficult to stand on that foot.  No other joint pain  currently.  Had not had BM since presentation.   Objective: Vitals:   07/29/20 0400 07/29/20 0745 07/29/20 1624 07/29/20 2010  BP: 96/66 108/69 (!) 89/65 107/60  Pulse: 75 72 95 99  Resp: 17 16 16 17   Temp: 97.7 F (36.5 C) 98.5 F (36.9 C) 99 F (37.2 C) 98.8 F (37.1 C)  TempSrc: Oral Oral  Oral  SpO2: 96% 99% 96% 97%  Weight:      Height:       No intake or output data in the 24 hours ending 07/29/20 2040 Filed Weights   07/26/20 1737  Weight: 62.1 kg    Examination:   Constitutional: NAD, AAOx3 HEENT: conjunctivae and lids normal, EOMI CV: No cyanosis.   RESP: normal respiratory effort, on RA Extremities: No effusions, edema in BLE SKIN: warm, crusted lesions around both ankles, worst over right lateral ankle.  Ointment present. Neuro: II - XII grossly intact.   Psych: depressed mood and affect.  Appropriate judgement and reason   07/26/20       Data Reviewed: I have personally reviewed following labs and imaging studies  CBC: Recent Labs  Lab 07/26/20 1739 07/28/20 0455 07/29/20 0552  WBC 12.1* 8.7 7.7  NEUTROABS 7.4  --   --   HGB 10.9* 10.4* 10.8*  HCT 35.8* 34.7* 36.1  MCV 77.2* 78.7* 78.5*  PLT 617* 532* 552*   Basic Metabolic Panel: Recent Labs  Lab 07/26/20  1739 07/28/20 0455 07/29/20 0552  NA 139 140 136  K 4.3 4.2 4.2  CL 102 105 102  CO2 25 30 27   GLUCOSE 88 97 97  BUN 16 20 14   CREATININE 0.70 0.68 0.63  CALCIUM 9.1 8.5* 8.7*  MG  --  2.0 2.0   GFR: Estimated Creatinine Clearance: 72.1 mL/min (by C-G formula based on SCr of 0.63 mg/dL). Liver Function Tests: Recent Labs  Lab 07/26/20 1739  AST 14*  ALT 12  ALKPHOS 71  BILITOT 0.3  PROT 7.3  ALBUMIN 3.2*   No results for input(s): LIPASE, AMYLASE in the last 168 hours. No results for input(s): AMMONIA in the last 168 hours. Coagulation Profile: No results for input(s): INR, PROTIME in the last 168 hours. Cardiac Enzymes: No results for input(s): CKTOTAL, CKMB,  CKMBINDEX, TROPONINI in the last 168 hours. BNP (last 3 results) No results for input(s): PROBNP in the last 8760 hours. HbA1C: No results for input(s): HGBA1C in the last 72 hours. CBG: No results for input(s): GLUCAP in the last 168 hours. Lipid Profile: No results for input(s): CHOL, HDL, LDLCALC, TRIG, CHOLHDL, LDLDIRECT in the last 72 hours. Thyroid Function Tests: Recent Labs    07/26/20 2253  TSH 3.498  FREET4 1.00   Anemia Panel: Recent Labs    07/26/20 2253  VITAMINB12 781  FOLATE 7.7  FERRITIN 9*  TIBC 357  IRON 18*  RETICCTPCT 1.9   Sepsis Labs: Recent Labs  Lab 07/26/20 1739  LATICACIDVEN 0.8    Recent Results (from the past 240 hour(s))  Blood culture (routine x 2)     Status: None (Preliminary result)   Collection Time: 07/26/20  6:35 PM   Specimen: BLOOD  Result Value Ref Range Status   Specimen Description BLOOD  LEFT AC  Final   Special Requests   Final    BOTTLES DRAWN AEROBIC AND ANAEROBIC Blood Culture results may not be optimal due to an inadequate volume of blood received in culture bottles   Culture   Final    NO GROWTH 3 DAYS Performed at [facility], [address], [place], [address] [place]    Report Status PENDING  Incomplete  Blood culture (routine x 2)     Status: None (Preliminary result)   Collection Time: 07/26/20  6:36 PM   Specimen: BLOOD  Result Value Ref Range Status   Specimen Description BLOOD  RIGHT [facility]  Final   Special Requests   Final    BOTTLES DRAWN AEROBIC AND ANAEROBIC Blood Culture adequate volume   Culture   Final    NO GROWTH 3 DAYS Performed at [facility], [address], [place], [address] [place]    Report Status PENDING  Incomplete  SARS CORONAVIRUS 2 (TAT 6-24 HRS) Nasopharyngeal Nasopharyngeal Swab     Status: None   Collection Time: 07/26/20 11:47 PM   Specimen: Nasopharyngeal Swab  Result Value Ref Range Status   SARS Coronavirus 2 NEGATIVE NEGATIVE Final    Comment:  (NOTE) SARS-CoV-2 target nucleic acids are NOT DETECTED.  The SARS-CoV-2 RNA is generally detectable in upper and lower respiratory specimens during the acute phase of infection. Negative results do not preclude SARS-CoV-2 infection, do not rule out co-infections with other pathogens, and should not be used as the sole basis for treatment or other patient management decisions. Negative results must be combined with clinical observations, patient history, and epidemiological information. The expected result is Negative.  Fact Sheet for Patients: 56387  Fact Sheet for Healthcare Providers: quierodirigir.com  This test is not yet approved or cleared by the [place] FDA and  has been authorized for detection and/or diagnosis of SARS-CoV-2 by FDA under an Emergency Use Authorization (EUA). This EUA will remain  in effect (meaning this test can be used) for the duration of the COVID-19 declaration under Se ction 564(b)(1) of the Act, 21 U.S.C. section 360bbb-3(b)(1), unless the authorization is terminated or revoked sooner.  Performed at [facility], [address], [place], [place] [address]   Aerobic Culture w Gram Stain (superficial specimen)     Status: None (Preliminary result)   Collection Time: 07/28/20  4:58 PM   Specimen: Leg; Wound  Result Value Ref Range Status   Specimen Description   Final    LEG Performed at [facility], [address] [place], [place] [address]    Special Requests   Final    Normal Performed at [facility], [address] [place], [place] [address]    Gram Stain NO WBC SEEN NO ORGANISMS SEEN   Final   Culture   Final    NO GROWTH < 12 HOURS Performed at [facility], [address] [place], [place] [address]    Report Status PENDING  Incomplete      Radiology Studies: No results found.   Scheduled Meds: . ARIPiprazole  2 mg Oral  Daily  . aspirin EC  81 mg Oral Daily  . buPROPion  100 mg Oral BID  . enoxaparin (LOVENOX) injection  40 mg Subcutaneous Q24H  . gabapentin  300 mg Oral TID  . mupirocin ointment   Topical BID  . nicotine  14 mg Transdermal Daily  . pantoprazole  40 mg Oral QHS  . polyethylene glycol  34 g Oral BID   Continuous Infusions: . vancomycin 1,250 mg (07/29/20 1812)     LOS: 3 days     Darlin Priestly, MD [facility] If 7PM-7AM, please contact night-coverage 07/29/2020, 8:40 PM

## 2020-07-30 ENCOUNTER — Inpatient Hospital Stay: Payer: Medicaid Other

## 2020-07-30 DIAGNOSIS — D75839 Thrombocytosis, unspecified: Secondary | ICD-10-CM

## 2020-07-30 DIAGNOSIS — L03119 Cellulitis of unspecified part of limb: Secondary | ICD-10-CM

## 2020-07-30 LAB — CBC
HCT: 37.4 % (ref 36.0–46.0)
Hemoglobin: 11.6 g/dL — ABNORMAL LOW (ref 12.0–15.0)
MCH: 23.7 pg — ABNORMAL LOW (ref 26.0–34.0)
MCHC: 31 g/dL (ref 30.0–36.0)
MCV: 76.5 fL — ABNORMAL LOW (ref 80.0–100.0)
Platelets: 596 K/uL — ABNORMAL HIGH (ref 150–400)
RBC: 4.89 MIL/uL (ref 3.87–5.11)
RDW: 18.7 % — ABNORMAL HIGH (ref 11.5–15.5)
WBC: 7.6 K/uL (ref 4.0–10.5)
nRBC: 0 % (ref 0.0–0.2)

## 2020-07-30 LAB — BASIC METABOLIC PANEL
Anion gap: 8 (ref 5–15)
BUN: 15 mg/dL (ref 6–20)
CO2: 28 mmol/L (ref 22–32)
Calcium: 9 mg/dL (ref 8.9–10.3)
Chloride: 102 mmol/L (ref 98–111)
Creatinine, Ser: 0.59 mg/dL (ref 0.44–1.00)
GFR, Estimated: >60 mL/min (ref >60)
Glucose, Bld: 93 mg/dL (ref 70–99)
Potassium: 4 mmol/L (ref 3.5–5.1)
Sodium: 138 mmol/L (ref 135–145)

## 2020-07-30 LAB — MAGNESIUM: Magnesium: 1.9 mg/dL (ref 1.7–2.4)

## 2020-07-30 LAB — HIV-1/2 AB - DIFFERENTIATION
HIV 1 Ab: NONREACTIVE
HIV 2 Ab: NONREACTIVE
Note: NEGATIVE

## 2020-07-30 MED ORDER — DOXYCYCLINE MONOHYDRATE 100 MG PO TABS: 100.0000 mg | ORAL_TABLET | Freq: Two times a day (BID) | ORAL | 0 refills | Status: AC

## 2020-07-30 MED ORDER — MUPIROCIN 2 % EX OINT: TOPICAL_OINTMENT | Freq: Two times a day (BID) | CUTANEOUS | 0 refills | Status: DC

## 2020-07-30 MED ORDER — GADOBUTROL 1 MMOL/ML IV SOLN
6.0000 mL | Freq: Once | INTRAVENOUS | Status: AC | PRN
  Administered 2020-07-30: 6 mL via INTRAVENOUS

## 2020-07-30 NOTE — Progress Notes (Signed)
PROGRESS NOTE    Alexandra [name]  STM:[id number] DOB: 06/30/1970 DOA: 07/26/2020 PCP: [facility]   Chief complaint.  Bilateral ankle rash. Brief Narrative:  Alexandra [name] a 50 y.o.femaleseen in ed with complaints ofbl foot pain.  Initially there was small red rash on the right ankle, it was itchy and then formed blisters.  It is spread to a larger area.  Then became granulated and necrotic. She is seen by ID, suspect impetigo, started on vancomycin.  Also recommended consult rheumatology.    Assessment & Plan:   Principal Problem:   Rash of both feet Active Problems:   Tobacco use disorder   Cellulitis   Foot pain, bilateral   Rash and nonspecific skin eruption  #1.  Bilateral ankles wounds.   Worse on the right side. ID suspected impetigo, vancomycin started.  We will also consult rheumatology for possibility of vasculitis.  Also obtain bilateral lower extremity duplex ultrasound to rule out peripheral vascular disease.   #2.  Tobacco abuse.  #3.  Thrombocytosis. Follow.   DVT prophylaxis: Lovenox Code Status: Full Family Communication:  Disposition Plan:  .   Status is: Inpatient  Remains inpatient appropriate because:Inpatient level of care appropriate due to severity of illness   Dispo: The patient is from: Home              Anticipated d/c is to: Home              Patient currently is not medically stable to d/c.   Difficult to place patient No        I/O last 3 completed shifts: In: 250 [IV Piggyback:250] Out: -  No intake/output data recorded.     Consultants:   ID  Procedures: None  Antimicrobials:  Vancomycin  Subjective: Patient still complaining of pain in the bilateral ankles when she stands up.   Denies any fever chills, no abdominal pain or nausea vomiting. No dysuria or hematuria.  Objective: Vitals:   07/29/20 2010 07/30/20 0058 07/30/20 0554 07/30/20 0808  BP: 107/60 95/63 101/60 95/61  Pulse:  99 83 85 81  Resp: 17 16 16 16   Temp: 98.8 F (37.1 C) 98.6 F (37 C) 97.7 F (36.5 C) 98.7 F (37.1 C)  TempSrc: Oral Oral Oral Oral  SpO2: 97% 96% 94% 97%  Weight:      Height:       No intake or output data in the 24 hours ending 07/30/20 1123 Filed Weights   07/26/20 1737  Weight: 62.1 kg    Examination:  General exam: Appears calm and comfortable  Respiratory system: Clear to auscultation. Respiratory effort normal. Cardiovascular system: S1 & S2 heard, RRR. No JVD, murmurs, rubs, gallops or clicks. No pedal edema. Gastrointestinal system: Abdomen is nondistended, soft and nontender. No organomegaly or masses felt. Normal bowel sounds heard. Central nervous system: Alert and oriented. No focal neurological deficits. Extremities: Right ankle wound, not red today. Left ankle smaller wound Skin: No rashes, lesions or ulcers Psychiatry: Judgement and insight appear normal. Mood & affect appropriate.     Data Reviewed: I have personally reviewed following labs and imaging studies  CBC: Recent Labs  Lab 07/26/20 1739 07/28/20 0455 07/29/20 0552 07/30/20 0525  WBC 12.1* 8.7 7.7 7.6  NEUTROABS 7.4  --   --   --   HGB 10.9* 10.4* 10.8* 11.6*  HCT 35.8* 34.7* 36.1 37.4  MCV 77.2* 78.7* 78.5* 76.5*  PLT 617* 532* 552* 596*  Basic Metabolic Panel: Recent Labs  Lab 07/26/20 1739 07/28/20 0455 07/29/20 0552 07/30/20 0525  NA 139 140 136 138  K 4.3 4.2 4.2 4.0  CL 102 105 102 102  CO2 25 30 27 28   GLUCOSE 88 97 97 93  BUN 16 20 14 15   CREATININE 0.70 0.68 0.63 0.59  CALCIUM 9.1 8.5* 8.7* 9.0  MG  --  2.0 2.0 1.9   GFR: Estimated Creatinine Clearance: 72.1 mL/min (by C-G formula based on SCr of 0.59 mg/dL). Liver Function Tests: Recent Labs  Lab 07/26/20 1739  AST 14*  ALT 12  ALKPHOS 71  BILITOT 0.3  PROT 7.3  ALBUMIN 3.2*   No results for input(s): LIPASE, AMYLASE in the last 168 hours. No results for input(s): AMMONIA in the last 168  hours. Coagulation Profile: No results for input(s): INR, PROTIME in the last 168 hours. Cardiac Enzymes: No results for input(s): CKTOTAL, CKMB, CKMBINDEX, TROPONINI in the last 168 hours. BNP (last 3 results) No results for input(s): PROBNP in the last 8760 hours. HbA1C: No results for input(s): HGBA1C in the last 72 hours. CBG: No results for input(s): GLUCAP in the last 168 hours. Lipid Profile: No results for input(s): CHOL, HDL, LDLCALC, TRIG, CHOLHDL, LDLDIRECT in the last 72 hours. Thyroid Function Tests: No results for input(s): TSH, T4TOTAL, FREET4, T3FREE, THYROIDAB in the last 72 hours. Anemia Panel: No results for input(s): VITAMINB12, FOLATE, FERRITIN, TIBC, IRON, RETICCTPCT in the last 72 hours. Sepsis Labs: Recent Labs  Lab 07/26/20 1739  LATICACIDVEN 0.8    Recent Results (from the past 240 hour(s))  Blood culture (routine x 2)     Status: None (Preliminary result)   Collection Time: 07/26/20  6:35 PM   Specimen: BLOOD  Result Value Ref Range Status   Specimen Description BLOOD  LEFT AC  Final   Special Requests   Final    BOTTLES DRAWN AEROBIC AND ANAEROBIC Blood Culture results may not be optimal due to an inadequate volume of blood received in culture bottles   Culture   Final    NO GROWTH 3 DAYS Performed at [facility], [address] [place], [address] [place]    Report Status PENDING  Incomplete  Blood culture (routine x 2)     Status: None (Preliminary result)   Collection Time: 07/26/20  6:36 PM   Specimen: BLOOD  Result Value Ref Range Status   Specimen Description BLOOD  RIGHT [facility]  Final   Special Requests   Final    BOTTLES DRAWN AEROBIC AND ANAEROBIC Blood Culture adequate volume   Culture   Final    NO GROWTH 3 DAYS Performed at [facility], [address], [place], [address] [place]    Report Status PENDING  Incomplete  SARS CORONAVIRUS 2 (TAT 6-24 HRS) Nasopharyngeal Nasopharyngeal Swab     Status: None    Collection Time: 07/26/20 11:47 PM   Specimen: Nasopharyngeal Swab  Result Value Ref Range Status   SARS Coronavirus 2 NEGATIVE NEGATIVE Final    Comment: (NOTE) SARS-CoV-2 target nucleic acids are NOT DETECTED.  The SARS-CoV-2 RNA is generally detectable in upper and lower respiratory specimens during the acute phase of infection. Negative results do not preclude SARS-CoV-2 infection, do not rule out co-infections with other pathogens, and should not be used as the sole basis for treatment or other patient management decisions. Negative results must be combined with clinical observations, patient history, and epidemiological information. The expected result is Negative.  Fact Sheet for Patients: HairSlick.no  Fact Sheet for Healthcare Providers: quierodirigir.com  This test is not yet approved or cleared by the [place] FDA and  has been authorized for detection and/or diagnosis of SARS-CoV-2 by FDA under an Emergency Use Authorization (EUA). This EUA will remain  in effect (meaning this test can be used) for the duration of the COVID-19 declaration under Se ction 564(b)(1) of the Act, 21 U.S.C. section 360bbb-3(b)(1), unless the authorization is terminated or revoked sooner.  Performed at [facility], [address] [place], [place] [address]   Aerobic Culture w Gram Stain (superficial specimen)     Status: None (Preliminary result)   Collection Time: 07/28/20  4:58 PM   Specimen: Leg; Wound  Result Value Ref Range Status   Specimen Description   Final    LEG Performed at [facility], [address], [place], [place] [address]    Special Requests   Final    Normal Performed at [facility], [address] [place], [place] [address]    Gram Stain NO WBC SEEN NO ORGANISMS SEEN   Final   Culture   Final    NO GROWTH < 12 HOURS Performed at [facility], [address],  [place], [place] [address]    Report Status PENDING  Incomplete         Radiology Studies: MR ANKLE RIGHT W WO CONTRAST  Result Date: 07/30/2020 CLINICAL DATA:  Open wound involving the lateral ankle. EXAM: MRI OF THE RIGHT ANKLE WITHOUT AND WITH CONTRAST TECHNIQUE: Multiplanar, multisequence MR imaging of the ankle was performed before and after the administration of intravenous contrast. CONTRAST:  69mL GADAVIST GADOBUTROL 1 MMOL/ML IV SOLN COMPARISON:  Radiographs 07/26/2020 FINDINGS: There is an open wound noted along the lateral aspect of the ankle with underlying cellulitis. There is also enhancing granulation tissue but no rim enhancing soft tissue abscess. No findings suspicious for septic arthritis or osteomyelitis. TENDONS Peroneal: Intact. Posteromedial: Intact Anterior: Intact Achilles: Normal Plantar Fascia: Intact LIGAMENTS Lateral: Intact Medial: Intact CARTILAGE Ankle Joint: Minimal degenerative changes. No chondral defects or osteochondral lesion. No findings for septic arthritis. Subtalar Joints/Sinus Tarsi: The subtalar joints are maintained. Mild degenerative changes. The sinus tarsi is unremarkable. Bones: No acute bony findings. No evidence of osteomyelitis. Mild midfoot degenerative changes. Other: Unremarkable foot and ankle musculature. No findings for myofasciitis or pyomyositis. IMPRESSION: IMPRESSION 1. Open wound along the lateral aspect of the ankle with underlying cellulitis. No subcutaneous abscess. 2. No MR findings to suggest myofasciitis, pyomyositis, septic arthritis or osteomyelitis. 3. Intact major ankle ligaments and tendons. Electronically Signed   By: Rudie Meyer M.D.   On: 07/30/2020 08:09        Scheduled Meds: . ARIPiprazole  2 mg Oral Daily  . aspirin EC  81 mg Oral Daily  . buPROPion  100 mg Oral BID  . enoxaparin (LOVENOX) injection  40 mg Subcutaneous Q24H  . gabapentin  300 mg Oral TID  . mupirocin ointment   Topical BID  . nicotine  14 mg  Transdermal Daily  . pantoprazole  40 mg Oral QHS  . polyethylene glycol  34 g Oral BID   Continuous Infusions: . vancomycin 1,250 mg (07/29/20 1812)     LOS: 4 days    Time spent: 27 minutes    Marrion Coy, MD [facility]   To contact the attending provider between 7A-7P or the covering provider during after hours 7P-7A, please  log into the web site [email or web address] and access using universal [address] password for that web site. If you do not have the password, please call the hospital operator.  07/30/2020, 11:23 AM

## 2020-07-30 NOTE — Discharge Summary (Signed)
Physician Discharge Summary  Patient ID: [name] MRN: [id number] DOB/AGE: Dec 09, 1970 50 y.o.  Admit date: 07/26/2020 Discharge date: 07/30/2020  Admission Diagnoses:  Discharge Diagnoses:  Principal Problem:   Rash of both feet Active Problems:   Tobacco use disorder   Cellulitis   Foot pain, bilateral   Rash and nonspecific skin eruption   Discharged Condition: good  Hospital Course:  Alexandra [name] a 50 y.o.femaleseen in ed with complaints ofbl foot pain.  Initially there was small red rash on the right ankle, it was itchy and then formed blisters.  It is spread to a larger area.  Then became granulated and necrotic. She is seen by ID, suspect impetigo, started on vancomycin.  Dr. Sampson Goon also spoke with Dr. Gavin Potters yesterday, rheumatology did not recommend any additional treatment.  Recommended antibiotic treatment, if not improved, consider biopsy.  Dr. Sampson Goon also spoke with dermatologist and arranged for outpatient appointment.  For now, most likely patient has staph aureus cellulitis.  Continue doxycycline for 10 days.  Also topical antibiotics.  #1.  Bilateral ankles wounds.   Worse on the right side. ID suspected impetigo, vancomycin started.    I spoke with Dr. Sampson Goon, patient will be followed by him as well as dermatologist in case condition does not improve.  He can have biopsy at that time. Continue doxycycline for 10 days. Patient has no evidence of peripheral vascular disease, she has a strong pedal pulse bilaterally, she does not have claudication.  #2.  Tobacco abuse.  #3.  Thrombocytosis.    Consults: ID  Significant Diagnostic Studies:  MRI OF THE RIGHT ANKLE WITHOUT AND WITH CONTRAST  TECHNIQUE: Multiplanar, multisequence MR imaging of the ankle was performed before and after the administration of intravenous contrast.  CONTRAST:  13mL GADAVIST GADOBUTROL 1 MMOL/ML IV SOLN  COMPARISON:  Radiographs  07/26/2020  FINDINGS: There is an open wound noted along the lateral aspect of the ankle with underlying cellulitis. There is also enhancing granulation tissue but no rim enhancing soft tissue abscess. No findings suspicious for septic arthritis or osteomyelitis.  TENDONS  Peroneal: Intact.  Posteromedial: Intact  Anterior: Intact  Achilles: Normal  Plantar Fascia: Intact  LIGAMENTS  Lateral: Intact  Medial: Intact  CARTILAGE  Ankle Joint: Minimal degenerative changes. No chondral defects or osteochondral lesion. No findings for septic arthritis.  Subtalar Joints/Sinus Tarsi: The subtalar joints are maintained. Mild degenerative changes. The sinus tarsi is unremarkable.  Bones: No acute bony findings. No evidence of osteomyelitis. Mild midfoot degenerative changes.  Other: Unremarkable foot and ankle musculature. No findings for myofasciitis or pyomyositis.  IMPRESSION: IMPRESSION 1. Open wound along the lateral aspect of the ankle with underlying cellulitis. No subcutaneous abscess. 2. No MR findings to suggest myofasciitis, pyomyositis, septic arthritis or osteomyelitis. 3. Intact major ankle ligaments and tendons.   Electronically Signed   By: Rudie Meyer M.D.   On: 07/30/2020 08:09  BILATERAL LOWER EXTREMITY VENOUS DUPLEX ULTRASOUND  TECHNIQUE: Gray-scale sonography with graded compression, as well as color Doppler and duplex ultrasound were performed to evaluate the lower extremity deep venous systems from the level of the common femoral vein and including the common femoral, femoral, profunda femoral, popliteal and calf veins including the posterior tibial, peroneal and gastrocnemius veins when visible. The superficial great saphenous vein was also interrogated. Spectral Doppler was utilized to evaluate flow at rest and with distal augmentation maneuvers in the common femoral, femoral and popliteal veins.  COMPARISON:   None.  FINDINGS: RIGHT  LOWER EXTREMITY  Common Femoral Vein: No evidence of thrombus. Normal compressibility, respiratory phasicity and response to augmentation.  Saphenofemoral Junction: No evidence of thrombus. Normal compressibility and flow on color Doppler imaging.  Profunda Femoral Vein: No evidence of thrombus. Normal compressibility and flow on color Doppler imaging.  Femoral Vein: No evidence of thrombus. Normal compressibility, respiratory phasicity and response to augmentation.  Popliteal Vein: No evidence of thrombus. Normal compressibility, respiratory phasicity and response to augmentation.  Calf Veins: No evidence of thrombus. Normal compressibility and flow on color Doppler imaging.  Superficial Great Saphenous Vein: No evidence of thrombus. Normal compressibility.  Venous Reflux:  None.  Other Findings: There is a popliteal/Baker's cyst in the right popliteal fossa measuring 2.3 x 0.9 cm.  LEFT LOWER EXTREMITY  Common Femoral Vein: No evidence of thrombus. Normal compressibility, respiratory phasicity and response to augmentation.  Saphenofemoral Junction: No evidence of thrombus. Normal compressibility and flow on color Doppler imaging.  Profunda Femoral Vein: No evidence of thrombus. Normal compressibility and flow on color Doppler imaging.  Femoral Vein: No evidence of thrombus. Normal compressibility, respiratory phasicity and response to augmentation.  Popliteal Vein: No evidence of thrombus. Normal compressibility, respiratory phasicity and response to augmentation.  Calf Veins: No evidence of thrombus. Normal compressibility and flow on color Doppler imaging.  Superficial Great Saphenous Vein: No evidence of thrombus. Normal compressibility.  Venous Reflux:  None.  Other Findings:  None.  IMPRESSION: No evidence of deep venous thrombosis in either lower extremity. Baker's cyst right popliteal fossa measuring 2.3  x 0.9 cm.   Electronically Signed   By: Bretta Bang III M.D.   On: 07/26/2020 20:03    Treatments: vancomycin  Discharge Exam: Blood pressure 122/70, pulse 90, temperature 98.4 F (36.9 C), temperature source Oral, resp. rate 16, height 5\' 2"  (1.575 m), weight 62.1 kg, last menstrual period 01/14/2009, SpO2 98 %. General appearance: alert and cooperative Resp: clear to auscultation bilaterally Cardio: regular rate and rhythm, S1, S2 normal, no murmur, click, rub or gallop GI: soft, non-tender; bowel sounds normal; no masses,  no organomegaly Extremities: right anle wounds/rashes present, no redness  Disposition: Discharge disposition: 01-Home or Self Care       Discharge Instructions    Diet - low sodium heart healthy   Complete by: As directed    Discharge wound care:   Complete by: As directed    Follow with pcp.   Increase activity slowly   Complete by: As directed      Allergies as of 07/30/2020   No Known Allergies     Medication List    TAKE these medications   ARIPiprazole 2 MG tablet Commonly known as: ABILIFY Take 2 mg by mouth daily.   aspirin 81 MG EC tablet Take 81 mg by mouth daily.   buPROPion 100 MG tablet Commonly known as: WELLBUTRIN Take 100 mg by mouth 2 (two) times daily.   cholecalciferol 25 MCG (1000 UNIT) tablet Commonly known as: VITAMIN D Take 1,000 Units by mouth daily. For Vitamin D maintenance after completion of Vitamin D treatment.   doxycycline 100 MG tablet Commonly known as: ADOXA Take 1 tablet (100 mg total) by mouth 2 (two) times daily for 10 days.   DULoxetine 60 MG capsule Commonly known as: CYMBALTA Take 60 mg by mouth daily.   gabapentin 100 MG capsule Commonly known as: NEURONTIN Take 100 mg by mouth 3 (three) times daily. Take 1 capsule by mouth three times a day for 3  days; then 2 capsules three times a day for 3 days; then 3 capsules three times a day   mupirocin ointment 2 % Commonly known as:  BACTROBAN Apply topically 2 (two) times daily.   valACYclovir 1000 MG tablet Commonly known as: VALTREX Take 1,000 mg by mouth 3 (three) times daily.            Discharge Care Instructions  (From admission, onward)         Start     Ordered   07/30/20 0000  Discharge wound care:       Comments: Follow with pcp.   07/30/20 1430          Follow-up Information    [facility] Follow up in 1 week(s).   Specialty: General Practice Contact information: [address] [place] [address] [phone]        Mick Sell, MD Follow up in 1 week(s).   Specialty: Infectious Diseases Contact information: [address] [place] [address] [phone]              32 minutes Signed: Marrion Coy 07/30/2020, 2:31 PM

## 2020-07-30 NOTE — Progress Notes (Signed)
[facility] CLINIC INFECTIOUS DISEASE PROGRESS NOTE Date of Admission:  07/26/2020     ID: Alexandra [name] is a 50 y.o. female with leg wounds *Principal Problem:   Rash of both feet Active Problems:   Tobacco use disorder   Cellulitis   Foot pain, bilateral   Rash and nonspecific skin eruption   Subjective: No fevers.  Still with pain at the foot but it is less red.  MRI done.  ROS  Eleven systems are reviewed and negative except per hpi  Medications:  Antibiotics Given (last 72 hours)    Date/Time Action Medication Dose Rate   07/27/20 2117 Given   minocycline (MINOCIN) capsule 100 mg 100 mg    07/28/20 1847 New Bag/Given   vancomycin (VANCOREADY) IVPB 1500 mg/300 mL 1,500 mg 150 mL/hr   07/29/20 1812 New Bag/Given   vancomycin (VANCOREADY) IVPB 1250 mg/250 mL 1,250 mg 166.7 mL/hr     . ARIPiprazole  2 mg Oral Daily  . aspirin EC  81 mg Oral Daily  . buPROPion  100 mg Oral BID  . enoxaparin (LOVENOX) injection  40 mg Subcutaneous Q24H  . gabapentin  300 mg Oral TID  . mupirocin ointment   Topical BID  . nicotine  14 mg Transdermal Daily  . pantoprazole  40 mg Oral QHS  . polyethylene glycol  34 g Oral BID    Objective: Vital signs in last 24 hours: Temp:  [97.7 F (36.5 C)-99 F (37.2 C)] 98.4 F (36.9 C) (05/18 1141) Pulse Rate:  [81-99] 90 (05/18 1141) Resp:  [16-17] 16 (05/18 1141) BP: (89-122)/(60-70) 122/70 (05/18 1141) SpO2:  [94 %-98 %] 98 % (05/18 1141) FiO2 (%):  [0 %] 0 % (05/18 0058) Physical Exam  Constitutional:  oriented to person, place, and time. appears well-developed and well-nourished. No distress.  HENT: [place]/AT, PERRLA, no scleral icterus Mouth/Throat: Oropharynx is clear and moist. No oropharyngeal exudate.  Cardiovascular: Normal rate, regular rhythm and normal heart sounds. Exam reveals no gallop and no friction rub.  No murmur heard.  Pulmonary/Chest: Effort normal and breath sounds normal. No respiratory distress.  has no wheezes.  Neck  = supple, no nuchal rigidity Abdominal: Soft. Bowel sounds are normal.  exhibits no distension. There is no tenderness.  Lymphadenopathy: no cervical adenopathy. No axillary adenopathy Neurological: alert and oriented to person, place, and time.  R ankle lateral with large scabbed over dry wound, improving surrounding erythema. New splotchy area of erythema most medial ankle. Scaly area medial ankle.  L dorsum foot with small scab  Lab Results Recent Labs    07/29/20 0552 07/30/20 0525  WBC 7.7 7.6  HGB 10.8* 11.6*  HCT 36.1 37.4  NA 136 138  K 4.2 4.0  CL 102 102  CO2 27 28  BUN 14 15  CREATININE 0.63 0.59    Microbiology: Results for orders placed or performed during the hospital encounter of 07/26/20  Blood culture (routine x 2)     Status: None (Preliminary result)   Collection Time: 07/26/20  6:35 PM   Specimen: BLOOD  Result Value Ref Range Status   Specimen Description BLOOD  LEFT AC  Final   Special Requests   Final    BOTTLES DRAWN AEROBIC AND ANAEROBIC Blood Culture results may not be optimal due to an inadequate volume of blood received in culture bottles   Culture   Final    NO GROWTH 3 DAYS Performed at [facility], [address], [place], [place]  [address]    Report Status PENDING  Incomplete  Blood culture (routine x 2)     Status: None (Preliminary result)   Collection Time: 07/26/20  6:36 PM   Specimen: BLOOD  Result Value Ref Range Status   Specimen Description BLOOD  RIGHT [facility]  Final   Special Requests   Final    BOTTLES DRAWN AEROBIC AND ANAEROBIC Blood Culture adequate volume   Culture   Final    NO GROWTH 3 DAYS Performed at [facility], [address] [place], [place] [address]    Report Status PENDING  Incomplete  SARS CORONAVIRUS 2 (TAT 6-24 HRS) Nasopharyngeal Nasopharyngeal Swab     Status: None   Collection Time: 07/26/20 11:47 PM   Specimen: Nasopharyngeal Swab  Result Value Ref Range Status   SARS Coronavirus 2  NEGATIVE NEGATIVE Final    Comment: (NOTE) SARS-CoV-2 target nucleic acids are NOT DETECTED.  The SARS-CoV-2 RNA is generally detectable in upper and lower respiratory specimens during the acute phase of infection. Negative results do not preclude SARS-CoV-2 infection, do not rule out co-infections with other pathogens, and should not be used as the sole basis for treatment or other patient management decisions. Negative results must be combined with clinical observations, patient history, and epidemiological information. The expected result is Negative.  Fact Sheet for Patients: HairSlick.no  Fact Sheet for Healthcare Providers: quierodirigir.com  This test is not yet approved or cleared by the [place] FDA and  has been authorized for detection and/or diagnosis of SARS-CoV-2 by FDA under an Emergency Use Authorization (EUA). This EUA will remain  in effect (meaning this test can be used) for the duration of the COVID-19 declaration under Se ction 564(b)(1) of the Act, 21 U.S.C. section 360bbb-3(b)(1), unless the authorization is terminated or revoked sooner.  Performed at [facility], [address] [place], [place] [address]   Aerobic Culture w Gram Stain (superficial specimen)     Status: None (Preliminary result)   Collection Time: 07/28/20  4:58 PM   Specimen: Leg; Wound  Result Value Ref Range Status   Specimen Description   Final    LEG Performed at [facility], [address] [place], [place] [address]    Special Requests   Final    Normal Performed at [facility], [address] [place], [place] [address]    Gram Stain NO WBC SEEN NO ORGANISMS SEEN   Final   Culture   Final    NO GROWTH < 12 HOURS Performed at [facility], [address], [place], [place] [address]    Report Status PENDING  Incomplete    Studies/Results: MR ANKLE RIGHT W WO CONTRAST  Result  Date: 07/30/2020 CLINICAL DATA:  Open wound involving the lateral ankle. EXAM: MRI OF THE RIGHT ANKLE WITHOUT AND WITH CONTRAST TECHNIQUE: Multiplanar, multisequence MR imaging of the ankle was performed before and after the administration of intravenous contrast. CONTRAST:  56mL GADAVIST GADOBUTROL 1 MMOL/ML IV SOLN COMPARISON:  Radiographs 07/26/2020 FINDINGS: There is an open wound noted along the lateral aspect of the ankle with underlying cellulitis. There is also enhancing granulation tissue but no rim enhancing soft tissue abscess. No findings suspicious for septic arthritis or osteomyelitis. TENDONS Peroneal: Intact. Posteromedial: Intact Anterior: Intact Achilles: Normal Plantar Fascia: Intact LIGAMENTS Lateral: Intact Medial: Intact CARTILAGE Ankle Joint: Minimal degenerative changes. No chondral defects or osteochondral lesion. No findings for septic arthritis. Subtalar Joints/Sinus Tarsi: The subtalar joints  are maintained. Mild degenerative changes. The sinus tarsi is unremarkable. Bones: No acute bony findings. No evidence of osteomyelitis. Mild midfoot degenerative changes. Other: Unremarkable foot and ankle musculature. No findings for myofasciitis or pyomyositis. IMPRESSION: IMPRESSION 1. Open wound along the lateral aspect of the ankle with underlying cellulitis. No subcutaneous abscess. 2. No MR findings to suggest myofasciitis, pyomyositis, septic arthritis or osteomyelitis. 3. Intact major ankle ligaments and tendons. Electronically Signed   By: Rudie Meyer M.D.   On: 07/30/2020 08:09    Assessment/Plan: Alexandra [name] is a 50 y.o. female relatively healthy female who now has over a month of lower extremity wounds around her bilateral ankles.  She has had multiple ER visits for arthralgias as well but reports those are improving.  She has not really received any antibiotics but was treated with Valtrex.  She is received a prednisone course.  As an outpatient she was referred to  rheumatology but has not seen them yet. She has no fever.  White count is normal.  Moderate elevations in her sed rate and CRP currently but her sed rate was all the way up to 90 initially.  HIV screening test was positive but neg  confirmatory test.. Differential diagnosis is broad.  This could just be impetigo given how it spread from one leg to the other and is pruritic and she reports has a yellow drainage when she scratches on it.  However vasculitis is certainly in the differential.  She is a smoker so Buerger's disease could be a possibility.   5/18 some improvement. Cannot get inpt derm consult of bxp. cx neg    Recommendations Can dc on oral doxy 100  Bid for 10 days Fu Dr Adolphus Birchwood in dermatology. If no improvement with abx should have skin bxp.  I have faxed her facesheet there with a message. Cont Bactroban ointment to the wound twice a day. Thank you very much for the consult. Will follow with you.  Mick Sell   07/30/2020, 2:24 PM

## 2020-07-30 NOTE — Plan of Care (Signed)
Patient discharged home per MD orders at this time.All discharge instructions,education and medications reviewed with patient.Pt expressed understanding and will comply with dc instructions.follow up appointments also communicated to patient.no verbal c/o or any ssx of distress.Patient transported self in her private car.

## 2020-07-31 LAB — CULTURE, BLOOD (ROUTINE X 2)
Culture: NO GROWTH
Culture: NO GROWTH
Special Requests: ADEQUATE

## 2020-07-31 LAB — AEROBIC CULTURE W GRAM STAIN (SUPERFICIAL SPECIMEN)
Culture: NORMAL
Gram Stain: NONE SEEN
Special Requests: NORMAL

## 2020-07-31 LAB — MISC LABCORP TEST (SEND OUT): Labcorp test code: 83962

## 2020-08-01 LAB — HEPATITIS PANEL, ACUTE
HCV Ab: NONREACTIVE
Hep A IgM: NONREACTIVE
Hep B C IgM: NONREACTIVE
Hepatitis B Surface Ag: UNDETERMINED — AB

## 2020-08-01 LAB — ANCA TITERS
Atypical P-ANCA titer: <1:20 {titer}
C-ANCA: <1:20 {titer}
P-ANCA: 1:160 {titer} — ABNORMAL HIGH

## 2020-08-01 LAB — RHEUMATOID FACTOR: Rheumatoid fact SerPl-aCnc: >650 IU/mL — ABNORMAL HIGH (ref <14.0)

## 2020-08-04 LAB — MISC LABCORP TEST (SEND OUT): Labcorp test code: 144000

## 2020-08-14 ENCOUNTER — Telehealth: Payer: Self-pay | Admitting: Infectious Diseases

## 2020-08-14 NOTE — Telephone Encounter (Signed)
Spoke with Dr Adolphus Birchwood in derm regarding biopsies.   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  P anca is positive. And RF positive.  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 Referral made to

## 2020-08-28 ENCOUNTER — Inpatient Hospital Stay
Admission: EM | Admit: 2020-08-28 | Discharge: 2020-09-16 | DRG: 269 | Disposition: A | Discharge status: Home Health | Payer: Medicaid Other | Attending: Internal Medicine | Admitting: Internal Medicine

## 2020-08-28 ENCOUNTER — Other Ambulatory Visit: Payer: Self-pay

## 2020-08-28 ENCOUNTER — Emergency Department: Payer: Medicaid Other

## 2020-08-28 DIAGNOSIS — Z720 Tobacco use: Secondary | ICD-10-CM | POA: Diagnosis present

## 2020-08-28 DIAGNOSIS — I7409 Other arterial embolism and thrombosis of abdominal aorta: Secondary | ICD-10-CM | POA: Diagnosis not present

## 2020-08-28 DIAGNOSIS — B9562 Methicillin resistant Staphylococcus aureus infection as the cause of diseases classified elsewhere: Secondary | ICD-10-CM | POA: Diagnosis present

## 2020-08-28 DIAGNOSIS — F32A Depression, unspecified: Secondary | ICD-10-CM | POA: Diagnosis present

## 2020-08-28 DIAGNOSIS — L97519 Non-pressure chronic ulcer of other part of right foot with unspecified severity: Secondary | ICD-10-CM | POA: Diagnosis present

## 2020-08-28 DIAGNOSIS — J8410 Pulmonary fibrosis, unspecified: Secondary | ICD-10-CM | POA: Diagnosis present

## 2020-08-28 DIAGNOSIS — R700 Elevated erythrocyte sedimentation rate: Secondary | ICD-10-CM | POA: Diagnosis present

## 2020-08-28 DIAGNOSIS — I731 Thromboangiitis obliterans [Buerger's disease]: Secondary | ICD-10-CM | POA: Diagnosis present

## 2020-08-28 DIAGNOSIS — I700 Atherosclerosis of aorta: Secondary | ICD-10-CM | POA: Diagnosis present

## 2020-08-28 DIAGNOSIS — Z20822 Contact with and (suspected) exposure to covid-19: Secondary | ICD-10-CM | POA: Diagnosis present

## 2020-08-28 DIAGNOSIS — D75839 Thrombocytosis, unspecified: Secondary | ICD-10-CM

## 2020-08-28 DIAGNOSIS — D72828 Other elevated white blood cell count: Secondary | ICD-10-CM | POA: Diagnosis not present

## 2020-08-28 DIAGNOSIS — J9811 Atelectasis: Secondary | ICD-10-CM | POA: Diagnosis present

## 2020-08-28 DIAGNOSIS — F17210 Nicotine dependence, cigarettes, uncomplicated: Secondary | ICD-10-CM | POA: Diagnosis present

## 2020-08-28 DIAGNOSIS — F17200 Nicotine dependence, unspecified, uncomplicated: Secondary | ICD-10-CM | POA: Diagnosis present

## 2020-08-28 DIAGNOSIS — Z803 Family history of malignant neoplasm of breast: Secondary | ICD-10-CM

## 2020-08-28 DIAGNOSIS — Z90710 Acquired absence of both cervix and uterus: Secondary | ICD-10-CM

## 2020-08-28 DIAGNOSIS — I70261 Atherosclerosis of native arteries of extremities with gangrene, right leg: Secondary | ICD-10-CM | POA: Diagnosis not present

## 2020-08-28 DIAGNOSIS — T380X5A Adverse effect of glucocorticoids and synthetic analogues, initial encounter: Secondary | ICD-10-CM | POA: Diagnosis not present

## 2020-08-28 DIAGNOSIS — R42 Dizziness and giddiness: Secondary | ICD-10-CM | POA: Diagnosis present

## 2020-08-28 DIAGNOSIS — M86161 Other acute osteomyelitis, right tibia and fibula: Secondary | ICD-10-CM

## 2020-08-28 DIAGNOSIS — I7789 Other specified disorders of arteries and arterioles: Secondary | ICD-10-CM | POA: Diagnosis present

## 2020-08-28 DIAGNOSIS — Z825 Family history of asthma and other chronic lower respiratory diseases: Secondary | ICD-10-CM

## 2020-08-28 DIAGNOSIS — L97311 Non-pressure chronic ulcer of right ankle limited to breakdown of skin: Secondary | ICD-10-CM | POA: Diagnosis present

## 2020-08-28 DIAGNOSIS — M25571 Pain in right ankle and joints of right foot: Secondary | ICD-10-CM | POA: Diagnosis present

## 2020-08-28 DIAGNOSIS — R230 Cyanosis: Secondary | ICD-10-CM | POA: Diagnosis not present

## 2020-08-28 DIAGNOSIS — G629 Polyneuropathy, unspecified: Secondary | ICD-10-CM | POA: Diagnosis present

## 2020-08-28 DIAGNOSIS — L03115 Cellulitis of right lower limb: Secondary | ICD-10-CM | POA: Diagnosis present

## 2020-08-28 DIAGNOSIS — Z5112 Encounter for antineoplastic immunotherapy: Secondary | ICD-10-CM | POA: Diagnosis present

## 2020-08-28 DIAGNOSIS — Z821 Family history of blindness and visual loss: Secondary | ICD-10-CM

## 2020-08-28 DIAGNOSIS — Z8249 Family history of ischemic heart disease and other diseases of the circulatory system: Secondary | ICD-10-CM

## 2020-08-28 DIAGNOSIS — M79671 Pain in right foot: Secondary | ICD-10-CM | POA: Diagnosis not present

## 2020-08-28 DIAGNOSIS — M7989 Other specified soft tissue disorders: Secondary | ICD-10-CM | POA: Diagnosis not present

## 2020-08-28 DIAGNOSIS — R159 Full incontinence of feces: Secondary | ICD-10-CM | POA: Diagnosis not present

## 2020-08-28 DIAGNOSIS — M869 Osteomyelitis, unspecified: Secondary | ICD-10-CM | POA: Diagnosis present

## 2020-08-28 DIAGNOSIS — I96 Gangrene, not elsewhere classified: Secondary | ICD-10-CM | POA: Diagnosis not present

## 2020-08-28 DIAGNOSIS — R195 Other fecal abnormalities: Secondary | ICD-10-CM | POA: Diagnosis not present

## 2020-08-28 DIAGNOSIS — M86671 Other chronic osteomyelitis, right ankle and foot: Secondary | ICD-10-CM | POA: Diagnosis not present

## 2020-08-28 DIAGNOSIS — R55 Syncope and collapse: Secondary | ICD-10-CM | POA: Diagnosis present

## 2020-08-28 DIAGNOSIS — R600 Localized edema: Secondary | ICD-10-CM | POA: Diagnosis not present

## 2020-08-28 DIAGNOSIS — L97319 Non-pressure chronic ulcer of right ankle with unspecified severity: Secondary | ICD-10-CM | POA: Diagnosis not present

## 2020-08-28 DIAGNOSIS — I998 Other disorder of circulatory system: Secondary | ICD-10-CM | POA: Diagnosis not present

## 2020-08-28 DIAGNOSIS — L97909 Non-pressure chronic ulcer of unspecified part of unspecified lower leg with unspecified severity: Secondary | ICD-10-CM

## 2020-08-28 DIAGNOSIS — D509 Iron deficiency anemia, unspecified: Secondary | ICD-10-CM

## 2020-08-28 DIAGNOSIS — F339 Major depressive disorder, recurrent, unspecified: Secondary | ICD-10-CM | POA: Diagnosis present

## 2020-08-28 DIAGNOSIS — I70233 Atherosclerosis of native arteries of right leg with ulceration of ankle: Secondary | ICD-10-CM | POA: Diagnosis not present

## 2020-08-28 DIAGNOSIS — Z79899 Other long term (current) drug therapy: Secondary | ICD-10-CM

## 2020-08-28 DIAGNOSIS — I7410 Embolism and thrombosis of unspecified parts of aorta: Secondary | ICD-10-CM | POA: Diagnosis not present

## 2020-08-28 DIAGNOSIS — L97513 Non-pressure chronic ulcer of other part of right foot with necrosis of muscle: Secondary | ICD-10-CM | POA: Diagnosis not present

## 2020-08-28 DIAGNOSIS — M064 Inflammatory polyarthropathy: Secondary | ICD-10-CM | POA: Diagnosis present

## 2020-08-28 DIAGNOSIS — Z7982 Long term (current) use of aspirin: Secondary | ICD-10-CM

## 2020-08-28 DIAGNOSIS — Z89511 Acquired absence of right leg below knee: Secondary | ICD-10-CM | POA: Diagnosis not present

## 2020-08-28 DIAGNOSIS — Z86718 Personal history of other venous thrombosis and embolism: Secondary | ICD-10-CM | POA: Clinically undetermined

## 2020-08-28 LAB — CBC WITH DIFFERENTIAL/PLATELET
Abs Immature Granulocytes: 0.03 10*3/uL (ref 0.00–0.07)
Basophils Absolute: 0.1 10*3/uL (ref 0.0–0.1)
Basophils Relative: 1 %
Eosinophils Absolute: 0.6 10*3/uL — ABNORMAL HIGH (ref 0.0–0.5)
Eosinophils Relative: 6 %
HCT: 35 % — ABNORMAL LOW (ref 36.0–46.0)
Hemoglobin: 10.7 g/dL — ABNORMAL LOW (ref 12.0–15.0)
Immature Granulocytes: 0 %
Lymphocytes Relative: 24 %
Lymphs Abs: 2.3 10*3/uL (ref 0.7–4.0)
MCH: 23.7 pg — ABNORMAL LOW (ref 26.0–34.0)
MCHC: 30.6 g/dL (ref 30.0–36.0)
MCV: 77.6 fL — ABNORMAL LOW (ref 80.0–100.0)
Monocytes Absolute: 0.9 10*3/uL (ref 0.1–1.0)
Monocytes Relative: 10 %
Neutro Abs: 5.6 10*3/uL (ref 1.7–7.7)
Neutrophils Relative %: 59 %
Platelets: 499 10*3/uL — ABNORMAL HIGH (ref 150–400)
RBC: 4.51 MIL/uL (ref 3.87–5.11)
RDW: 18.2 % — ABNORMAL HIGH (ref 11.5–15.5)
WBC: 9.5 10*3/uL (ref 4.0–10.5)
nRBC: 0 % (ref 0.0–0.2)

## 2020-08-28 LAB — COMPREHENSIVE METABOLIC PANEL
ALT: 11 U/L (ref 0–44)
AST: 16 U/L (ref 15–41)
Albumin: 3.3 g/dL — ABNORMAL LOW (ref 3.5–5.0)
Alkaline Phosphatase: 85 U/L (ref 38–126)
Anion gap: 8 (ref 5–15)
BUN: 15 mg/dL (ref 6–20)
CO2: 25 mmol/L (ref 22–32)
Calcium: 8.7 mg/dL — ABNORMAL LOW (ref 8.9–10.3)
Chloride: 105 mmol/L (ref 98–111)
Creatinine, Ser: 0.75 mg/dL (ref 0.44–1.00)
GFR, Estimated: >60 mL/min (ref >60)
Glucose, Bld: 88 mg/dL (ref 70–99)
Potassium: 3.5 mmol/L (ref 3.5–5.1)
Sodium: 138 mmol/L (ref 135–145)
Total Bilirubin: 0.3 mg/dL (ref 0.3–1.2)
Total Protein: 7.3 g/dL (ref 6.5–8.1)

## 2020-08-28 LAB — PROTIME-INR
INR: 1.1 (ref 0.8–1.2)
Prothrombin Time: 13.9 seconds (ref 11.4–15.2)

## 2020-08-28 LAB — RESP PANEL BY RT-PCR (FLU A&B, COVID) ARPGX2
Influenza A by PCR: NEGATIVE
Influenza B by PCR: NEGATIVE
SARS Coronavirus 2 by RT PCR: NEGATIVE

## 2020-08-28 LAB — APTT: aPTT: 28 seconds (ref 24–36)

## 2020-08-28 LAB — LACTIC ACID, PLASMA: Lactic Acid, Venous: 1.4 mmol/L (ref 0.5–1.9)

## 2020-08-28 MED ORDER — MORPHINE SULFATE (PF) 4 MG/ML IV SOLN
6.0000 mg | Freq: Once | INTRAVENOUS | Status: AC
  Administered 2020-08-28: 6 mg via INTRAVENOUS
  Filled 2020-08-28: qty 2

## 2020-08-28 MED ORDER — LACTATED RINGERS IV BOLUS
1000.0000 mL | Freq: Once | INTRAVENOUS | Status: AC
  Administered 2020-08-28: 1000 mL via INTRAVENOUS

## 2020-08-28 MED ORDER — PIPERACILLIN-TAZOBACTAM 3.375 G IVPB 30 MIN
3.3750 g | Freq: Once | INTRAVENOUS | Status: AC
  Administered 2020-08-28: 3.375 g via INTRAVENOUS
  Filled 2020-08-28: qty 50

## 2020-08-28 MED ORDER — VANCOMYCIN HCL 1250 MG/250ML IV SOLN
1250.0000 mg | Freq: Once | INTRAVENOUS | Status: AC
  Administered 2020-08-29: 02:00:00 1250 mg via INTRAVENOUS
  Filled 2020-08-28: qty 250

## 2020-08-28 NOTE — ED Triage Notes (Signed)
Pt presents via POV c/o right ankle wound x3 months. Reports admitted x1 month ago for same issue. Reports being followed by dermatology. Reports apt for rheumatology in July. Reports wound very painful and worsening. Report drainage and foul odor.

## 2020-08-28 NOTE — ED Provider Notes (Signed)
[facility] Emergency Department Provider Note ____________________________________________   Event Date/Time   First MD Initiated Contact with Patient 08/28/20 2120     (approximate)  I have reviewed the triage vital signs and the nursing notes.  HISTORY  Chief Complaint Wound Check   HPI Alexandra [name] is a 50 y.o. femalewho presents to the ED for evaluation of her wound.   Chart review indicates admission last month, 5/14 - 5/18, for rash to her bilateral feet, primarily right ankle.  Diagnosed with cellulitis, suspect disease suspected impetigo.  She was discharged with a course of doxycycline to continue for 10 additional days. I see telephone encounter from 6/2, ID Dr. 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  P ANCA positive and rheumatoid factor positive.  Patient returns to the ED for evaluation of worsening pain to her right ankle, purulent discharge to an ulcerative wound with a foul smell.  She reports these worsening symptoms have been progressive over the past 2-3 days.  She reports severe 10/10 atraumatic pain to her right ankle.  She reports associated generalized weakness, subjective chills and sweats without documented fever.  She was lightheaded dizziness and presyncope without syncope.  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 Authorizing Provider  ARIPiprazole (ABILIFY) 2 MG tablet Take 2 mg by mouth daily.    [provider]  aspirin 81 MG EC tablet Take 81 mg by mouth daily. Patient not taking: Reported on 07/27/2020    [provider]  buPROPion (WELLBUTRIN) 100 MG tablet Take 100 mg by mouth 2 (two) times daily.    [provider]  cholecalciferol (VITAMIN D) 25 MCG (1000 UNIT) tablet Take 1,000 Units by mouth daily. For Vitamin D maintenance after completion of Vitamin D treatment. 04/04/20   [provider]  DULoxetine (CYMBALTA) 60 MG capsule Take 60 mg by mouth daily. Patient not taking: Reported on 07/27/2020    [provider]  gabapentin (NEURONTIN) 100 MG capsule Take 100 mg by mouth 3 (three) times daily. Take 1 capsule by mouth three times a day for 3 days; then 2 capsules three times a day for 3 days; then 3 capsules three times a day Patient not taking: Reported on 07/27/2020 07/24/20   [provider]  mupirocin ointment (BACTROBAN) 2 % Apply topically 2 (two) times daily. 07/30/20   Marrion Coy, MD  valACYclovir (VALTREX) 1000 MG tablet Take 1,000 mg by mouth 3 (three) times daily. Patient not taking: Reported on 07/27/2020 07/24/20   [provider]  albuterol (PROVENTIL HFA;VENTOLIN HFA) 108 (90 Base) MCG/ACT inhaler Inhale 2 puffs into the lungs every 6 (six) hours as needed. 03/26/17 12/20/19  Rebecka Apley, MD    Allergies Patient has no known allergies.  Family History  Problem Relation Age of Onset   Breast cancer Mother 62   Breast cancer Maternal Aunt    Breast cancer Maternal Grandmother    Breast cancer Maternal Aunt        Maternal Great Aunt    Breast cancer Cousin 34    Social History Social History   Tobacco Use   Smoking status: Every Day     Packs/day: 1.00    Years: 29.00    Pack years: 29.00    Types: Cigarettes   Smokeless tobacco: Never  Substance Use Topics   Alcohol use: Yes    Alcohol/week: 0.0 standard drinks    Comment: social   Drug use: No    Review of Systems  Constitutional: Positive for subjective chills, generalized weakness and dizziness. Eyes: No visual changes. ENT: No sore throat. Cardiovascular: Denies chest pain. Respiratory: Denies shortness of breath. Gastrointestinal: No abdominal pain.  No nausea, no vomiting.  No diarrhea.  No constipation. Genitourinary: Negative for dysuria. Musculoskeletal: Negative for back pain.  Positive for atraumatic right ankle pain. Skin: Negative for rash. Neurological: Negative for headaches, focal weakness or numbness.  ____________________________________________   PHYSICAL EXAM:  VITAL SIGNS: Vitals:   08/28/20 1926 08/28/20 2240  BP: 112/62 111/89  Pulse: (!) 106 95  Resp: 16 19  Temp: 98.7 F (37.1 C)   SpO2: 97% 100%     Constitutional: Alert and oriented.  Appears uncomfortable, tearful. Eyes: Conjunctivae are normal. PERRL. EOMI. Head: Atraumatic. Nose: No congestion/rhinnorhea. Mouth/Throat: Mucous membranes are moist.  Oropharynx non-erythematous. Neck: No stridor. No cervical spine tenderness to palpation. Cardiovascular: Tachycardic rate, regular rhythm. Grossly normal heart sounds.  Good peripheral circulation. Respiratory: Normal respiratory effort.  No retractions. Lungs CTAB. Gastrointestinal: Soft , nondistended, nontender to palpation. No CVA tenderness. Musculoskeletal:  No joint effusions. No signs of acute trauma. Ulcerative lesion to right lateral ankle, as pictured below.  Just superior to her lateral malleolus.  Purulent discharge and foul smell is present.  Tenderness surrounding this diffusely.  Calf is soft.  Brisk capillary refill to her toes.  Strong DP pulse. Neurologic:  Normal speech and language. No gross focal  neurologic deficits are appreciated.  Skin:  Skin is warm, dry and intact. No rash noted. Psychiatric: Mood and affect are normal. Speech and behavior are normal.    ____________________________________________   LABS (all labs ordered are listed, but only abnormal results are displayed)  Labs Reviewed  COMPREHENSIVE METABOLIC PANEL - Abnormal; Notable for the following components:      Result Value   Calcium 8.7 (*)    Albumin 3.3 (*)    All other components within normal limits  CBC WITH DIFFERENTIAL/PLATELET - Abnormal; Notable for the following components:   Hemoglobin 10.7 (*)    HCT 35.0 (*)    MCV 77.6 (*)    MCH 23.7 (*)    RDW 18.2 (*)    Platelets 499 (*)    Eosinophils Absolute 0.6 (*)    All other components within normal limits  CULTURE, BLOOD (SINGLE)  URINE CULTURE  RESP PANEL BY RT-PCR (FLU A&B, COVID) ARPGX2  CULTURE, BLOOD (SINGLE) W REFLEX TO ID PANEL  LACTIC ACID, PLASMA  PROTIME-INR  APTT  LACTIC ACID, PLASMA  URINALYSIS, COMPLETE (UACMP) WITH MICROSCOPIC  PREGNANCY, URINE   ____________________________________________  12 Lead EKG  Sinus rhythm, rate 84 bpm.  Normal axis and intervals.  No evidence of acute ischemia. ____________________________________________  RADIOLOGY  ED MD interpretation: Plain film of the right ankle reviewed by me.  Official radiology report(s): DG Ankle Complete Right  Result Date: 08/28/2020 CLINICAL DATA:  Right ankle wound for 3 months, ulcerations, assess for osteomyelitis EXAM: RIGHT ANKLE - COMPLETE 3+ VIEW COMPARISON:  Radiograph 07/26/2020 FINDINGS: Persistent diffuse soft tissue swelling of the lower extremity with more focal skin thickening and ulcerative defect along the lateral malleolus and distal fibular diaphysis. There may be some early periosteal reaction which could reflect early features of periostitis or early developing osteomyelitis. Small ankle joint effusion. No acute bony abnormality.  Specifically, no fracture, subluxation, or dislocation. Degenerative changes in the ankle and imaged portions of the foot. Posterior calcaneal spurring. IMPRESSION: Diffuse soft tissue swelling or more focal ulceration over the lateral malleolus and distal fibular diaphysis where some early periosteal reaction is suggested. May implicate reactive periostitis or early osteomyelitis. Electronically Signed   By: Kreg Shropshire M.D.   On: 08/28/2020 23:09   DG Chest Port 1 View  Result Date: 08/28/2020 CLINICAL DATA:  Sepsis, lower extremity wounds EXAM: PORTABLE CHEST 1 VIEW COMPARISON:  12/20/2019 radiograph, 06/05/2011 CT FINDINGS: Low lung volumes. Patchy and reticular opacities are present in the mid to lower lungs with associated pulmonary vascular congestion, fissural thickening and peribronchial cuffing. Chronic reticular changes are noted on comparison priors as well. No pneumothorax or visible effusion. Stable cardiomediastinal contours. No acute osseous or soft tissue abnormality. IMPRESSION: Coarse reticular and patchy opacities in the lung bases favor superimposed edematous change given vascular congestion, septal thickening and cuffing. Underlying infection is difficult to fully exclude. Low volumes with additional features of atelectasis. Electronically Signed   By: Kreg Shropshire M.D.   On: 08/28/2020 23:11    ____________________________________________   PROCEDURES and INTERVENTIONS  Procedure(s) performed (including Critical Care):  Procedures  Medications  vancomycin (VANCOREADY) IVPB 1250 mg/250 mL (has no administration in time range)  piperacillin-tazobactam (ZOSYN) IVPB 3.375 g (has no administration in time range)  morphine 4 MG/ML injection 6 mg (6 mg Intravenous Given 08/28/20 2241)  lactated ringers bolus 1,000 mL (1,000 mLs Intravenous New Bag/Given 08/28/20 2245)    ____________________________________________   MDM / ED COURSE   50 year old woman with ulcerative lesion  to her right ankle over the past few months, possibly vasculitis, presents to the ED with evidence of superimposed infection and osteomyelitis requiring medical admission.  Tachycardic in triage, otherwise normal vitals.  Blood work reassuring without evidence of sepsis, leukocytosis or lactic acidosis.  X-ray unfortunately does demonstrate evidence of periosteal reaction and her medical picture is most consistent with osteomyelitis.  We will initiate broad-spectrum antibiotics after cultures are drawn and discussed the case with hospitalist for admission.     ____________________________________________   FINAL CLINICAL IMPRESSION(S) / ED DIAGNOSES  Final diagnoses:  Other acute osteomyelitis of right fibula [facility]     ED Discharge Orders     None        Anchor Dwan   Note:  This document was prepared using Dragon voice recognition software and may include unintentional dictation errors.    Delton Prairie, MD 08/28/20 2325

## 2020-08-29 ENCOUNTER — Inpatient Hospital Stay: Payer: Medicaid Other

## 2020-08-29 ENCOUNTER — Encounter: Payer: Self-pay | Admitting: Internal Medicine

## 2020-08-29 DIAGNOSIS — R600 Localized edema: Secondary | ICD-10-CM

## 2020-08-29 DIAGNOSIS — F32A Depression, unspecified: Secondary | ICD-10-CM

## 2020-08-29 DIAGNOSIS — L97319 Non-pressure chronic ulcer of right ankle with unspecified severity: Secondary | ICD-10-CM

## 2020-08-29 DIAGNOSIS — F17200 Nicotine dependence, unspecified, uncomplicated: Secondary | ICD-10-CM

## 2020-08-29 DIAGNOSIS — Z0000 Encounter for general adult medical examination without abnormal findings: Secondary | ICD-10-CM

## 2020-08-29 DIAGNOSIS — L97519 Non-pressure chronic ulcer of other part of right foot with unspecified severity: Secondary | ICD-10-CM

## 2020-08-29 LAB — CBC
HCT: 31 % — ABNORMAL LOW (ref 36.0–46.0)
Hemoglobin: 9.8 g/dL — ABNORMAL LOW (ref 12.0–15.0)
MCH: 24.7 pg — ABNORMAL LOW (ref 26.0–34.0)
MCHC: 31.6 g/dL (ref 30.0–36.0)
MCV: 78.1 fL — ABNORMAL LOW (ref 80.0–100.0)
Platelets: 387 10*3/uL (ref 150–400)
RBC: 3.97 MIL/uL (ref 3.87–5.11)
RDW: 18 % — ABNORMAL HIGH (ref 11.5–15.5)
WBC: 10.5 10*3/uL (ref 4.0–10.5)
nRBC: 0 % (ref 0.0–0.2)

## 2020-08-29 LAB — HEMOGLOBIN A1C
Hgb A1c MFr Bld: 5.6 % (ref 4.8–5.6)
Mean Plasma Glucose: 114 mg/dL

## 2020-08-29 LAB — LACTIC ACID, PLASMA: Lactic Acid, Venous: 1 mmol/L (ref 0.5–1.9)

## 2020-08-29 LAB — BASIC METABOLIC PANEL
Anion gap: 5 (ref 5–15)
BUN: 15 mg/dL (ref 6–20)
CO2: 25 mmol/L (ref 22–32)
Calcium: 8.2 mg/dL — ABNORMAL LOW (ref 8.9–10.3)
Chloride: 110 mmol/L (ref 98–111)
Creatinine, Ser: 0.72 mg/dL (ref 0.44–1.00)
GFR, Estimated: >60 mL/min (ref >60)
Glucose, Bld: 93 mg/dL (ref 70–99)
Potassium: 4 mmol/L (ref 3.5–5.1)
Sodium: 140 mmol/L (ref 135–145)

## 2020-08-29 LAB — PREALBUMIN: Prealbumin: 12.9 mg/dL — ABNORMAL LOW (ref 18–38)

## 2020-08-29 LAB — C-REACTIVE PROTEIN: CRP: 2.2 mg/dL — ABNORMAL HIGH (ref <1.0)

## 2020-08-29 LAB — SEDIMENTATION RATE: Sed Rate: 54 mm/hr — ABNORMAL HIGH (ref 0–30)

## 2020-08-29 MED ORDER — SODIUM CHLORIDE 0.9% FLUSH: 3.0000 mL | INTRAVENOUS | Status: DC | PRN

## 2020-08-29 MED ORDER — VANCOMYCIN HCL 1250 MG/250ML IV SOLN
1250.0000 mg | INTRAVENOUS | Status: DC
  Administered 2020-08-29 – 2020-08-30 (×2): 1250 mg via INTRAVENOUS
  Filled 2020-08-29 (×4): qty 250

## 2020-08-29 MED ORDER — METRONIDAZOLE 500 MG/100ML IV SOLN
500.0000 mg | Freq: Three times a day (TID) | INTRAVENOUS | Status: DC
  Administered 2020-08-29 – 2020-09-01 (×11): 500 mg via INTRAVENOUS
  Filled 2020-08-29 (×14): qty 100

## 2020-08-29 MED ORDER — VANCOMYCIN HCL 750 MG/150ML IV SOLN
750.0000 mg | Freq: Two times a day (BID) | INTRAVENOUS | Status: DC
  Administered 2020-08-29: 14:00:00 750 mg via INTRAVENOUS
  Filled 2020-08-29 (×2): qty 150

## 2020-08-29 MED ORDER — SODIUM CHLORIDE 0.9% FLUSH
3.0000 mL | Freq: Two times a day (BID) | INTRAVENOUS | Status: DC
  Administered 2020-08-29 – 2020-09-16 (×34): 3 mL via INTRAVENOUS

## 2020-08-29 MED ORDER — ACETAMINOPHEN 325 MG PO TABS
650.0000 mg | ORAL_TABLET | Freq: Four times a day (QID) | ORAL | Status: AC | PRN
  Administered 2020-08-29: 650 mg via ORAL
  Filled 2020-08-29 (×2): qty 2

## 2020-08-29 MED ORDER — SODIUM CHLORIDE 0.9 % IV SOLN
250.0000 mL | INTRAVENOUS | Status: DC | PRN
  Administered 2020-08-29: 250 mL via INTRAVENOUS

## 2020-08-29 MED ORDER — ARIPIPRAZOLE 2 MG PO TABS: 2.0000 mg | ORAL_TABLET | Freq: Every day | ORAL | Status: DC

## 2020-08-29 MED ORDER — ENOXAPARIN SODIUM 40 MG/0.4ML IJ SOSY
40.0000 mg | PREFILLED_SYRINGE | INTRAMUSCULAR | Status: DC
  Administered 2020-08-29 – 2020-09-06 (×8): 40 mg via SUBCUTANEOUS
  Filled 2020-08-29 (×8): qty 0.4

## 2020-08-29 MED ORDER — NICOTINE 21 MG/24HR TD PT24
21.0000 mg | MEDICATED_PATCH | Freq: Every day | TRANSDERMAL | Status: DC
  Administered 2020-08-29 – 2020-09-16 (×19): 21 mg via TRANSDERMAL
  Filled 2020-08-29 (×19): qty 1

## 2020-08-29 MED ORDER — BUPROPION HCL 100 MG PO TABS: 100.0000 mg | ORAL_TABLET | Freq: Two times a day (BID) | ORAL | Status: DC

## 2020-08-29 MED ORDER — ENSURE MAX PROTEIN PO LIQD
11.0000 [oz_av] | Freq: Two times a day (BID) | ORAL | Status: DC
  Administered 2020-08-29: 21:00:00 11 [oz_av] via ORAL
  Administered 2020-08-30: 237 mL via ORAL
  Administered 2020-08-30 – 2020-09-03 (×6): 11 [oz_av] via ORAL
  Filled 2020-08-29: qty 330

## 2020-08-29 MED ORDER — FERROUS GLUCONATE 324 (38 FE) MG PO TABS
324.0000 mg | ORAL_TABLET | Freq: Two times a day (BID) | ORAL | Status: DC
  Administered 2020-08-29 – 2020-09-16 (×33): 324 mg via ORAL
  Filled 2020-08-29 (×40): qty 1

## 2020-08-29 MED ORDER — SODIUM CHLORIDE 0.9 % IV SOLN
2.0000 g | INTRAVENOUS | Status: DC
  Administered 2020-08-29 – 2020-09-01 (×4): 2 g via INTRAVENOUS
  Filled 2020-08-29: qty 2
  Filled 2020-08-29 (×2): qty 20
  Filled 2020-08-29: qty 2
  Filled 2020-08-29 (×2): qty 20

## 2020-08-29 MED ORDER — ASPIRIN 81 MG PO TBEC: 81.0000 mg | DELAYED_RELEASE_TABLET | Freq: Every day | ORAL | Status: DC

## 2020-08-29 MED ORDER — VITAMIN D3 25 MCG (1000 UNIT) PO TABS
1000.0000 [IU] | ORAL_TABLET | Freq: Every day | ORAL | Status: DC
  Administered 2020-08-29 – 2020-09-16 (×19): 1000 [IU] via ORAL
  Filled 2020-08-29 (×38): qty 1

## 2020-08-29 MED ORDER — ASCORBIC ACID 500 MG PO TABS
250.0000 mg | ORAL_TABLET | Freq: Two times a day (BID) | ORAL | Status: DC
  Administered 2020-08-29 – 2020-09-16 (×35): 250 mg via ORAL
  Filled 2020-08-29 (×35): qty 1

## 2020-08-29 MED ORDER — JUVEN PO PACK
1.0000 | PACK | Freq: Two times a day (BID) | ORAL | Status: DC
  Administered 2020-08-29 – 2020-09-16 (×29): 1 via ORAL

## 2020-08-29 MED ORDER — ONDANSETRON HCL 4 MG/2ML IJ SOLN
4.0000 mg | Freq: Four times a day (QID) | INTRAMUSCULAR | Status: DC | PRN
  Administered 2020-08-31 – 2020-09-13 (×12): 4 mg via INTRAVENOUS
  Filled 2020-08-29 (×15): qty 2

## 2020-08-29 MED ORDER — HYDROMORPHONE HCL 1 MG/ML IJ SOLN
0.5000 mg | INTRAMUSCULAR | Status: DC | PRN
  Administered 2020-08-29 – 2020-09-06 (×24): 0.5 mg via INTRAVENOUS
  Filled 2020-08-29 (×24): qty 1

## 2020-08-29 MED ORDER — ONDANSETRON HCL 4 MG PO TABS
4.0000 mg | ORAL_TABLET | Freq: Four times a day (QID) | ORAL | Status: DC | PRN
  Administered 2020-08-30: 13:00:00 4 mg via ORAL
  Filled 2020-08-29: qty 1

## 2020-08-29 MED ORDER — GABAPENTIN 300 MG PO CAPS
300.0000 mg | ORAL_CAPSULE | Freq: Three times a day (TID) | ORAL | Status: DC
  Administered 2020-08-29 – 2020-09-06 (×24): 300 mg via ORAL
  Filled 2020-08-29 (×24): qty 1

## 2020-08-29 MED ORDER — DULOXETINE HCL 60 MG PO CPEP: 60.0000 mg | ORAL_CAPSULE | Freq: Every day | ORAL | Status: DC

## 2020-08-29 NOTE — Progress Notes (Addendum)
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 Continue on IV flagyl, vanco, rocephin. RF and P-ANCA were positive. Podiatry and vascular surg consulted   Depression: severity unknown. Pt stopped taking her home dose of aripiprazole, bupropion, & cymbalta   Nicotine dependence: nicotine patch to prevent w/drawal. Smoking cessation counseling   IDA: will start on iron supplements. No need for a transfusion currently    DVT prophylaxis: lovenox  Code Status:  full  Family Communication:  Disposition Plan: likely d/c back home   Level of care: Med-Surg  Status is: Inpatient  Remains inpatient appropriate because:Ongoing diagnostic testing needed not appropriate for outpatient work up, IV treatments appropriate due to intensity of illness or inability to take PO, and Inpatient level of care appropriate due to severity of illness  Dispo: The patient is from: home               Anticipated d/c is to: Home              Patient currently is not medically stable to d/c.   Difficult to place patient : unclear    Consultants:  Podiatry Vascular surg   Procedures:   Antimicrobials: vanco, flagyl & ceftriaxone    Subjective: Pt c/o intermittent numbness & tingling of right foot   Objective: Vitals:   08/28/20 1927 08/28/20 2240 08/28/20 2327 08/29/20 0136  BP:  111/89 110/69 118/67  Pulse:  95 81 74  Resp:  19 18 16   Temp:    97.9 F (36.6 C)  TempSrc:    Oral  SpO2:  100% 100% 96%  Weight: 58.5 kg     Height: 5\' 3"  (1.6 m)       Intake/Output Summary (Last 24 hours) at 08/29/2020 0721 Last data filed at 08/29/2020 0302 Gross per 24 hour  Intake 342.89 ml  Output --   Net 342.89 ml   Filed Weights   08/28/20 1927  Weight: 58.5 kg    Examination:  General exam: Appears calm and comfortable  Respiratory system: Clear to auscultation. Respiratory effort normal. Cardiovascular system: S1 & S2+.  No rubs, gallops or clicks. B/l LE edema R>L  Gastrointestinal system: Abdomen is nondistended, soft and nontender. Normal bowel sounds heard. Central nervous system: Alert and oriented. Moves all extremities Extremities: large ulcer over right malleolus  Psychiatry: Judgement and insight appear normal. Mood & affect appropriate.     Data Reviewed: I have personally reviewed following labs and imaging studies  CBC: Recent Labs  Lab 08/28/20 2236 08/29/20 0450  WBC 9.5 10.5  NEUTROABS 5.6  --   HGB 10.7* 9.8*  HCT 35.0* 31.0*  MCV 77.6* 78.1*  PLT 499* 387   Basic Metabolic Panel: Recent Labs  Lab 08/28/20 2236 08/29/20 0450  NA 138 140  K 3.5 4.0  CL 105 110  CO2 25 25  GLUCOSE 88 93  BUN 15 15  CREATININE 0.75 0.72  CALCIUM 8.7* 8.2*   GFR: Estimated Creatinine Clearance: 68.8 mL/min (by C-G formula based on SCr of 0.72 mg/dL). Liver Function Tests: Recent Labs  Lab 08/28/20 2236  AST 16  ALT 11  ALKPHOS 85  BILITOT 0.3  PROT 7.3  ALBUMIN 3.3*   No results for input(s): LIPASE, AMYLASE in the last 168 hours. No results for input(s): AMMONIA in the last 168 hours. Coagulation Profile: Recent Labs  Lab 08/28/20 2236  INR 1.1   Cardiac Enzymes: No results for input(s): CKTOTAL, CKMB, CKMBINDEX, TROPONINI in the last 168 hours. BNP (last 3 results) No results for input(s): PROBNP in the last 8760 hours. HbA1C: No results for input(s): HGBA1C in the last 72 hours. CBG: No results for input(s): GLUCAP in the last 168 hours. Lipid Profile: No results for input(s): CHOL, HDL, LDLCALC, TRIG, CHOLHDL, LDLDIRECT in the last 72 hours. Thyroid Function Tests: No results for input(s): TSH, T4TOTAL, FREET4, T3FREE, THYROIDAB  in the last 72 hours. Anemia Panel: No results for input(s): VITAMINB12, FOLATE, FERRITIN, TIBC, IRON, RETICCTPCT in the last 72 hours. Sepsis Labs: Recent Labs  Lab 08/28/20 2236 08/29/20 0450  LATICACIDVEN 1.4 1.0    Recent Results (from the past 240 hour(s))  Culture, blood (single) w Reflex to ID Panel     Status: None (Preliminary result)   Collection Time: 08/28/20 10:36 PM   Specimen: BLOOD  Result Value Ref Range Status   Specimen Description BLOOD RIGHT ANTECUBITAL  Final   Special Requests   Final    BOTTLES DRAWN AEROBIC AND ANAEROBIC Blood Culture adequate volume   Culture   Final    NO GROWTH < 12 HOURS Performed at [facility], [address], [place], [place] [address]    Report Status PENDING  Incomplete  Resp Panel by RT-PCR (Flu A&B, Covid) Nasopharyngeal Swab     Status: None   Collection Time: 08/28/20 10:37 PM   Specimen: Nasopharyngeal Swab; Nasopharyngeal(NP) swabs in vial transport medium  Result Value Ref Range Status   SARS Coronavirus 2 by RT PCR NEGATIVE NEGATIVE Final    Comment: (NOTE) SARS-CoV-2 target nucleic acids are NOT DETECTED.  The SARS-CoV-2 RNA is generally detectable in upper respiratory specimens during the acute phase of infection. The lowest concentration of SARS-CoV-2 viral copies this assay can detect is 138 copies/mL. A negative result does not preclude SARS-Cov-2 infection and should not be used as the sole basis for treatment or other patient management decisions. A negative result may occur with  improper specimen collection/handling, submission of specimen other than nasopharyngeal swab, presence of viral mutation(s) within the areas targeted by this assay, and inadequate number of viral copies(<138 copies/mL). A negative result must be combined with clinical observations, patient history, and epidemiological information. The expected result is Negative.  Fact Sheet for Patients:   BloggerCourse.com  Fact Sheet for Healthcare Providers:  SeriousBroker.it  This test is no t yet approved or cleared by the [place] FDA and  has been authorized for detection and/or diagnosis of SARS-CoV-2 by FDA under an Emergency Use Authorization (EUA). This EUA will remain  in effect (meaning this test can be used) for the duration of the COVID-19 declaration under Section 564(b)(1) of the Act, 21 U.S.C.section 360bbb-3(b)(1), unless the authorization is terminated  or revoked sooner.       Influenza A by PCR NEGATIVE NEGATIVE Final   Influenza B by PCR NEGATIVE NEGATIVE Final    Comment: (NOTE) The Xpert Xpress SARS-CoV-2/FLU/RSV plus assay is intended as an aid in the diagnosis of influenza from Nasopharyngeal swab specimens and should not be used as a sole basis for treatment. Nasal washings and aspirates are unacceptable for Xpert Xpress SARS-CoV-2/FLU/RSV testing.  Fact Sheet for Patients: BloggerCourse.com  Fact Sheet for  Healthcare Providers: SeriousBroker.it  This test is not yet approved or cleared by the [place] and has been authorized for detection and/or diagnosis of SARS-CoV-2 by FDA under an Emergency Use Authorization (EUA). This EUA will remain in effect (meaning this test can be used) for the duration of the COVID-19 declaration under Section 564(b)(1) of the Act, 21 U.S.C. section 360bbb-3(b)(1), unless the authorization is terminated or revoked.  Performed at [facility], [address] [place], [place] [address]   Blood culture (routine single)     Status: None (Preliminary result)   Collection Time: 08/28/20 10:39 PM   Specimen: BLOOD  Result Value Ref Range Status   Specimen Description BLOOD LEFT ANTECUBITAL  Final   Special Requests   Final    BOTTLES DRAWN AEROBIC AND ANAEROBIC Blood Culture adequate volume    Culture   Final    NO GROWTH < 12 HOURS Performed at [facility], [address] [place], [place] [address]    Report Status PENDING  Incomplete         Radiology Studies: DG Ankle Complete Right  Result Date: 08/28/2020 CLINICAL DATA:  Right ankle wound for 3 months, ulcerations, assess for osteomyelitis EXAM: RIGHT ANKLE - COMPLETE 3+ VIEW COMPARISON:  Radiograph 07/26/2020 FINDINGS: Persistent diffuse soft tissue swelling of the lower extremity with more focal skin thickening and ulcerative defect along the lateral malleolus and distal fibular diaphysis. There may be some early periosteal reaction which could reflect early features of periostitis or early developing osteomyelitis. Small ankle joint effusion. No acute bony abnormality. Specifically, no fracture, subluxation, or dislocation. Degenerative changes in the ankle and imaged portions of the foot. Posterior calcaneal spurring. IMPRESSION: Diffuse soft tissue swelling or more focal ulceration over the lateral malleolus and distal fibular diaphysis where some early periosteal reaction is suggested. May implicate reactive periostitis or early osteomyelitis. Electronically Signed   By: Kreg Shropshire M.D.   On: 08/28/2020 23:09   DG Chest Port 1 View  Result Date: 08/28/2020 CLINICAL DATA:  Sepsis, lower extremity wounds EXAM: PORTABLE CHEST 1 VIEW COMPARISON:  12/20/2019 radiograph, 06/05/2011 CT FINDINGS: Low lung volumes. Patchy and reticular opacities are present in the mid to lower lungs with associated pulmonary vascular congestion, fissural thickening and peribronchial cuffing. Chronic reticular changes are noted on comparison priors as well. No pneumothorax or visible effusion. Stable cardiomediastinal contours. No acute osseous or soft tissue abnormality. IMPRESSION: Coarse reticular and patchy opacities in the lung bases favor superimposed edematous change given vascular congestion, septal thickening and cuffing.  Underlying infection is difficult to fully exclude. Low volumes with additional features of atelectasis. Electronically Signed   By: Kreg Shropshire M.D.   On: 08/28/2020 23:11        Scheduled Meds:  ARIPiprazole  2 mg Oral Daily   aspirin  81 mg Oral Daily   buPROPion  100 mg Oral BID   cholecalciferol  1,000 Units Oral Daily   DULoxetine  60 mg Oral Daily   enoxaparin (LOVENOX) injection  40 mg Subcutaneous Q24H   gabapentin  300 mg Oral TID   nicotine  21 mg Transdermal Daily   sodium chloride flush  3 mL Intravenous Q12H   Continuous Infusions:  sodium chloride 250 mL (08/29/20 0142)   cefTRIAXone (ROCEPHIN)  IV 2 g (08/29/20 0504)   metronidazole 500 mg (08/29/20 0213)   vancomycin       LOS: 1 day    Time spent: 33 mins  Charise Killian, MD [facility] Pager [phone]  If 7PM-7AM, please contact night-coverage 08/29/2020, 7:21 AM

## 2020-08-29 NOTE — H&P (Signed)
History and Physical    Alexandra [name] Alexandra [name] DOB: May 30, 1970 DOA: 08/28/2020  PCP: [facility]   Patient coming from: Home  I have personally briefly reviewed patient's old medical records in [facility]  Chief Complaint: Right lower extremity pain  HPI: Alexandra [name] is a 50 y.o. 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 Patient states that she has had this ulcer for 3 months, it is nonhealing and appears to be extending to the anterior surface of her right leg.  She rates her pain a 10 x 10 in intensity at its worst and pain is usually at rest.  When she is ambulating or standing on her feet she does not feel any pain. She has increased purulent drainage from the wound but denies having any fever or chills.  She has felt unwell and complains of nausea without any emesis or abdominal pain. She was hospitalized 1 month ago and was discharged home on doxycycline which she completed without any significant improvement.  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  Patient was referred to rheumatology and has an appointment scheduled for July 7. She denies having any chest pain,  no headache, no cough, no abdominal pain, no changes in her bowel habits, no dizziness, no lightheadedness no urinary symptoms or changes in her bowel habits. Labs showed sodium 138, potassium 3.5, chloride 105, bicarb 25, glucose 88, BUN 15, creatinine 0.75, calcium 8.7, alkaline phosphatase 85, albumin 3.3, AST 16, ALT 11, total protein 7.3, lactic acid 1.4, white count 9.5,  hemoglobin 10.7, hematocrit 35.0, MCV 77.6, RDW 18.2, platelet count 499, PT 13.9, INR 1.1 Respiratory viral panel is negative Right ankle x-ray shows diffuse soft tissue swelling or more focal ulceration over the lateral malleolus and distal fibular diaphysis where some early periosteal reaction is suggested. May implicate reactive periostitis or early osteomyelitis. Chest x-ray reviewed by me shows coarse reticular and patchy opacities in the lung bases favor superimposed edematous change given vascular congestion, septal thickening and cuffing. Underlying infection is difficult to fully exclude. Low volumes with additional features of atelectasis. Twelve-lead EKG reviewed by me shows sinus rhythm.     ED Course: Patient is a 50 year old female who presents to the ER for evaluation of pain and increased purulent drainage from a chronic ulcer over her right ankle. Imaging shows early periosteal reaction which could be reactive periostitis versus early osteomyelitis. She will be admitted to the hospital for further evaluation.    Review of Systems: As per HPI otherwise all other systems reviewed and negative.    Past Medical History:  Diagnosis Date   Breast discharge 2013   present X 3 years  Left only multiple ducts per pt   Depression     Past Surgical History:  Procedure Laterality Date   ABDOMINAL HYSTERECTOMY     BREAST CYST EXCISION Left 2006     reports that she has been smoking cigarettes. She has a 29.00 pack-year smoking history. She has never used smokeless tobacco. She reports current alcohol use. She reports that she does not use drugs.  No Known Allergies  Family History  Problem Relation Age  of Onset   Breast cancer Mother 81   Breast cancer Maternal Aunt    Breast cancer Maternal Grandmother    Breast cancer Maternal Aunt        Maternal Great Aunt    Breast cancer Cousin 31      Prior to Admission medications   Medication Sig Start Date End Date  Taking? Authorizing Provider  gabapentin (NEURONTIN) 100 MG capsule Take 100 mg by mouth 3 (three) times daily. Take 1 capsule by mouth three times a day for 3 days; then 2 capsules three times a day for 3 days; then 3 capsules three times a day 07/24/20  Yes [provider]  PRESCRIPTION MEDICATION Nicotine patches not sure what strength   Yes [provider]  ARIPiprazole (ABILIFY) 2 MG tablet Take 2 mg by mouth daily. Patient not taking: No sig reported    [provider]  aspirin 81 MG EC tablet Take 81 mg by mouth daily. Patient not taking: No sig reported    [provider]  buPROPion (WELLBUTRIN) 100 MG tablet Take 100 mg by mouth 2 (two) times daily. Patient not taking: No sig reported    [provider]  cholecalciferol (VITAMIN D) 25 MCG (1000 UNIT) tablet Take 1,000 Units by mouth daily. For Vitamin D maintenance after completion of Vitamin D treatment. 04/04/20   [provider]  DULoxetine (CYMBALTA) 60 MG capsule Take 60 mg by mouth daily. Patient not taking: No sig reported    [provider]  mupirocin ointment (BACTROBAN) 2 % Apply topically 2 (two) times daily. 07/30/20   Marrion Coy, MD  valACYclovir (VALTREX) 1000 MG tablet Take 1,000 mg by mouth 3 (three) times daily. Patient not taking: No sig reported 07/24/20   [provider]  albuterol (PROVENTIL HFA;VENTOLIN HFA) 108 (90 Base) MCG/ACT inhaler Inhale 2 puffs into the lungs every 6 (six) hours as needed. 03/26/17 12/20/19  Rebecka Apley, MD    Physical Exam: Vitals:   08/28/20 1926 08/28/20 1927 08/28/20 2240 08/28/20 2327  BP: 112/62  111/89 110/69  Pulse: (!) 106  95 81  Resp: 16  19 18   Temp: 98.7 F (37.1 C)     TempSrc: Oral     SpO2: 97%  100% 100%  Weight:  58.5 kg    Height:  5\' 3"  (1.6 m)       Vitals:   08/28/20 1926 08/28/20 1927 08/28/20 2240 08/28/20 2327  BP: 112/62  111/89 110/69  Pulse: (!) 106  95 81  Resp: 16  19 18    Temp: 98.7 F (37.1 C)     TempSrc: Oral     SpO2: 97%  100% 100%  Weight:  58.5 kg    Height:  5\' 3"  (1.6 m)        Constitutional: Alert and oriented x 3 . Not in any apparent distress HEENT:      Head: Normocephalic and atraumatic.         Eyes: PERLA, EOMI, Conjunctivae are normal. Sclera is non-icteric.       Mouth/Throat: Mucous membranes are moist.       Neck: Supple with no signs of meningismus. Cardiovascular: Regular rate and rhythm. No murmurs, gallops, or rubs. 2+ symmetrical distal pulses are present . No JVD. No LE edema Respiratory: Respiratory effort normal .Lungs sounds clear bilaterally. No wheezes, crackles, or rhonchi.  Gastrointestinal: Soft, non tender, and non distended with positive bowel sounds.  Genitourinary: No CVA tenderness. Musculoskeletal:  Decreased range of motion right ankle.  Right lower extremity swelling Neurologic:  Face is symmetric. Moving all extremities. No gross focal neurologic deficits . Skin: Skin is warm, dry.  Ulcer over right lateral malleolus with purulent drainage, surrounding redness Psychiatric: Mood and affect are normal    Labs on Admission: I have personally reviewed following labs and imaging studies  CBC: Recent Labs  Lab 08/28/20 2236  WBC 9.5  NEUTROABS 5.6  HGB 10.7*  HCT 35.0*  MCV 77.6*  PLT 499*   Basic Metabolic Panel: Recent Labs  Lab 08/28/20 2236  NA 138  K 3.5  CL 105  CO2 25  GLUCOSE 88  BUN 15  CREATININE 0.75  CALCIUM 8.7*   GFR: Estimated Creatinine Clearance: 68.8 mL/min (by C-G formula based on SCr of 0.75 mg/dL). Liver Function Tests: Recent Labs  Lab 08/28/20 2236  AST 16  ALT 11  ALKPHOS 85  BILITOT 0.3  PROT 7.3  ALBUMIN 3.3*   No results for input(s): LIPASE, AMYLASE in the last 168 hours. No results for input(s): AMMONIA in the last 168 hours. Coagulation Profile: Recent Labs  Lab 08/28/20 2236  INR 1.1   Cardiac Enzymes: No results for input(s): CKTOTAL, CKMB,  CKMBINDEX, TROPONINI in the last 168 hours. BNP (last 3 results) No results for input(s): PROBNP in the last 8760 hours. HbA1C: No results for input(s): HGBA1C in the last 72 hours. CBG: No results for input(s): GLUCAP in the last 168 hours. Lipid Profile: No results for input(s): CHOL, HDL, LDLCALC, TRIG, CHOLHDL, LDLDIRECT in the last 72 hours. Thyroid Function Tests: No results for input(s): TSH, T4TOTAL, FREET4, T3FREE, THYROIDAB in the last 72 hours. Anemia Panel: No results for input(s): VITAMINB12, FOLATE, FERRITIN, TIBC, IRON, RETICCTPCT in the last 72 hours. Urine analysis:    Component Value Date/Time   COLORURINE YELLOW (A) 07/26/2020 2051   APPEARANCEUR HAZY (A) 07/26/2020 2051   APPEARANCEUR Clear 09/17/2013 1229   LABSPEC 1.014 07/26/2020 2051   LABSPEC 1.008 09/17/2013 1229   PHURINE 5.0 07/26/2020 2051   GLUCOSEU NEGATIVE 07/26/2020 2051   GLUCOSEU Negative 09/17/2013 1229   HGBUR NEGATIVE 07/26/2020 2051   BILIRUBINUR NEGATIVE 07/26/2020 2051   BILIRUBINUR Negative 09/17/2013 1229   KETONESUR NEGATIVE 07/26/2020 2051   PROTEINUR NEGATIVE 07/26/2020 2051   NITRITE NEGATIVE 07/26/2020 2051   LEUKOCYTESUR NEGATIVE 07/26/2020 2051   LEUKOCYTESUR Negative 09/17/2013 1229    Radiological Exams on Admission: DG Ankle Complete Right  Result Date: 08/28/2020 CLINICAL DATA:  Right ankle wound for 3 months, ulcerations, assess for osteomyelitis EXAM: RIGHT ANKLE - COMPLETE 3+ VIEW COMPARISON:  Radiograph 07/26/2020 FINDINGS: Persistent diffuse soft tissue swelling of the lower extremity with more focal skin thickening and ulcerative defect along the lateral malleolus and distal fibular diaphysis. There may be some early periosteal reaction which could reflect early features of periostitis or early developing osteomyelitis. Small ankle joint effusion. No acute bony abnormality. Specifically, no fracture, subluxation, or dislocation. Degenerative changes in the ankle and  imaged portions of the foot. Posterior calcaneal spurring. IMPRESSION: Diffuse soft tissue swelling or more focal ulceration over the lateral malleolus and distal fibular diaphysis where some early periosteal reaction is suggested. May implicate reactive periostitis or early osteomyelitis. Electronically Signed   By: Kreg ShropshirePrice  DeHay M.D.   On: 08/28/2020 23:09   DG Chest Port 1 View  Result Date: 08/28/2020 CLINICAL DATA:  Sepsis, lower extremity wounds EXAM: PORTABLE CHEST 1 VIEW COMPARISON:  12/20/2019 radiograph, 06/05/2011 CT FINDINGS: Low  lung volumes. Patchy and reticular opacities are present in the mid to lower lungs with associated pulmonary vascular congestion, fissural thickening and peribronchial cuffing. Chronic reticular changes are noted on comparison priors as well. No pneumothorax or visible effusion. Stable cardiomediastinal contours. No acute osseous or soft tissue abnormality. IMPRESSION: Coarse reticular and patchy opacities in the lung bases favor superimposed edematous change given vascular congestion, septal thickening and cuffing. Underlying infection is difficult to fully exclude. Low volumes with additional features of atelectasis. Electronically Signed   By: Kreg Shropshire M.D.   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  Patient has been referred to rheumatology as an outpatient and has an appointment in July We will treat patient empirically with vancomycin, Rocephin and  Flagyl She will benefit from ID consult for further recommendation    Depression Continue aripiprazole, bupropion and Cymbalta    Nicotine dependence Smoking cessation has been discussed with patient in detail We will place patient on nicotine transdermal patch 21 mg daily   DVT prophylaxis: Lovenox  Code Status: full code  Family Communication: Greater than 50% of time was spent discussing plan of care with patient at the bedside.  All questions and concerns have been addressed.  She verbalizes understanding and agrees with the plan. Disposition Plan: Back to previous home environment Consults called: Podiatry Status: At the time of admission, it appears that the appropriate admission status for this patient is inpatient. This is judged to be reasonable and necessary in order to provide the required intensity of service to ensure the patient's safety given the presenting symptoms, physical exam findings, and initial radiographic and laboratory data in the context of their comorbid conditions. Patient requires inpatient status due to high intensity of service, high risk for further deterioration and high frequency of surveillance.    Lucile Shutters MD [facility]     08/29/2020, 12:24 AM

## 2020-08-29 NOTE — Progress Notes (Signed)
Pharmacy Antibiotic Note  Alexandra [name] is a 50 y.o. female admitted on 08/28/2020 with  cellulits (suspected diabetic foot infection @ high risk for MRSA) .  Pharmacy has been consulted for Vancomcyin dosing.  Plan: Pt order initial dose of Vancomycin 1250 by MD. Vancomycin 750 mg IV Q 12 hrs.  Goal AUC 400-550. Expected AUC: 545.1 SCr used: 0.75  Pharmacy will continue to follow and adjust abx dosing whenever warranted.   Height: 5\' 3"  (160 cm) Weight: 58.5 kg (129 lb) IBW/kg (Calculated) : 52.4  Temp (24hrs), Avg:98.3 F (36.8 C), Min:97.9 F (36.6 C), Max:98.7 F (37.1 C)  Recent Labs  Lab 08/28/20 2236  WBC 9.5  CREATININE 0.75  LATICACIDVEN 1.4    Estimated Creatinine Clearance: 68.8 mL/min (by C-G formula based on SCr of 0.75 mg/dL).    No Known Allergies  Antimicrobials this admission: 6/16 Zosyn x 1  6/16 Vancomycin >> 6/17 Ceftriaxone >>  6/17 Flagyl >>   Microbiology results: 6/16 BCx: Pending 6/16 UCx: Pending   Thank you for allowing pharmacy to be a part of this patient's care.  7/16, PharmD, [facility] 08/29/2020 2:12 AM

## 2020-08-29 NOTE — Progress Notes (Signed)
Initial Nutrition Assessment  DOCUMENTATION CODES:   Not applicable  INTERVENTION:  Continue current diet as ordered, encourage PO intake 1 packet Juven BID, each packet provides 95 calories, 2.5 grams of protein (collagen), and 9.8 grams of carbohydrate (3 grams sugar); also contains 7 grams of L-arginine and L-glutamine, 300 mg vitamin C, 15 mg vitamin E, 1.2 mcg vitamin B-12, 9.5 mg zinc, 200 mg calcium, and 1.5 g  Calcium Beta-hydroxy-Beta-methylbutyrate to support wound healing Vitamin C 500mg  daily Ensure Max po BID, each supplement provides 150 kcal and 30 grams of protein.   NUTRITION DIAGNOSIS:  Increased nutrient needs related to wound healing as evidenced by estimated needs.  GOAL:  Patient will meet greater than or equal to 90% of their needs  MONITOR:  PO intake, Supplement acceptance, Skin, Labs  REASON FOR ASSESSMENT:  Consult Wound healing  ASSESSMENT:  50 year old woman with ulcerative lesion to her right ankle worsening over the past 3 months. Tachycardic in triage, otherwise normal vitals.  X-ray demonstrates evidence of periosteal reaction and her medical picture is most consistent with osteomyelitis.  Pt on phone at the first attempted visit, with other provider at second attempt. Will follow-up with nutrition hx and exam at a later date. Noted that podiatry and vascular consults are pending for evaluation of wound. Will add nutrition supplements to support wound healing and follow along for management plan  Average Meal Intake: 6/16-6/17: 95% intake x 1 recorded meal  Nutritionally Relevant Medications: Scheduled Meds:  cholecalciferol  1,000 Units Oral Daily   Continuous Infusions:  vancomycin     PRN Meds: ondansetron  Labs Reviewed  NUTRITION - FOCUSED PHYSICAL EXAM: Defer to follow-up assessment, pt unavailable.  Diet Order:   Diet Order             Diet regular Room service appropriate? Yes; Fluid consistency: Thin  Diet effective now                    EDUCATION NEEDS:  No education needs have been identified at this time  Skin:  Skin Assessment: Skin Integrity Issues: Skin Integrity Issues:: Other (Comment) Other: open draining wound to the right, lateral ankle. 5x7 cm  Last BM:  6/15  Height:  Ht Readings from Last 1 Encounters:  08/28/20 5\' 3"  (1.6 m)    Weight:  Wt Readings from Last 1 Encounters:  08/28/20 58.5 kg    Ideal Body Weight:  52.3 kg  BMI:  Body mass index is 22.85 kg/m.  Estimated Nutritional Needs:  Kcal:  1800-2000 kcal/d Protein:  90-100 g/d Fluid:  >2L/d   , RD, LDN Clinical Dietitian Pager on Amion

## 2020-08-29 NOTE — Consult Note (Signed)
[facility] Vascular Consult Note  MRN : [id number]  Alexandra [name] is a 50 y.o. (1971-01-27) female who presents with chief complaint of  Chief Complaint  Patient presents with   Wound Check  .  History of Present Illness: I am asked to see the patient by Dr. Mayford Knife.  We are seeing the patient for nonhealing ulceration of the right foot and ankle.  This is been present for several weeks to months now.  The patient has severe pain from this ulceration.  She does have a long history of tobacco abuse and there is concern for poor perfusion contributing to the lower extremity wound.  No fevers or chills.  Started on antibiotics here at the hospital.  Current Facility-Administered Medications  Medication Dose Route Frequency Provider Last Rate Last Admin   0.9 %  sodium chloride infusion  250 mL Intravenous PRN Agbata, Tochukwu, MD 10 mL/hr at 08/29/20 1015 Infusion Verify at 08/29/20 1015   cefTRIAXone (ROCEPHIN) 2 g in sodium chloride 0.9 % 100 mL IVPB  2 g Intravenous Q24H Lucile Shutters, MD   Stopped at 08/29/20 0534   cholecalciferol (VITAMIN D) tablet 1,000 Units  1,000 Units Oral Daily Agbata, Tochukwu, MD   1,000 Units at 08/29/20 0825   enoxaparin (LOVENOX) injection 40 mg  40 mg Subcutaneous Q24H Agbata, Tochukwu, MD   40 mg at 08/29/20 0827   ferrous gluconate (FERGON) tablet 324 mg  324 mg Oral BID WC Charise Killian, MD   324 mg at 08/29/20 1542   gabapentin (NEURONTIN) capsule 300 mg  300 mg Oral TID Agbata, Tochukwu, MD   300 mg at 08/29/20 1543   HYDROmorphone (DILAUDID) injection 0.5 mg  0.5 mg Intravenous Q4H PRN Agbata, Tochukwu, MD   0.5 mg at 08/29/20 1233   metroNIDAZOLE (FLAGYL) IVPB 500 mg  500 mg Intravenous Q8H Agbata, Tochukwu, MD   Stopped at 08/29/20 0941   nicotine (NICODERM CQ - dosed in mg/24 hours) patch 21 mg  21 mg Transdermal Daily Agbata, Tochukwu, MD   21 mg at 08/29/20 0826   ondansetron (ZOFRAN) tablet 4 mg  4 mg Oral Q6H  PRN Agbata, Tochukwu, MD       Or   ondansetron (ZOFRAN) injection 4 mg  4 mg Intravenous Q6H PRN Agbata, Tochukwu, MD       sodium chloride flush (NS) 0.9 % injection 3 mL  3 mL Intravenous Q12H Agbata, Tochukwu, MD   3 mL at 08/29/20 0827   sodium chloride flush (NS) 0.9 % injection 3 mL  3 mL Intravenous PRN Agbata, Tochukwu, MD       vancomycin (VANCOREADY) IVPB 1250 mg/250 mL  1,250 mg Intravenous Q24H Bettey Costa, RPH        Past Medical History:  Diagnosis Date   Breast discharge 2013   present X 3 years  Left only multiple ducts per pt   Depression     Past Surgical History:  Procedure Laterality Date   ABDOMINAL HYSTERECTOMY     BREAST CYST EXCISION Left 2006     Social History   Tobacco Use   Smoking status: Every Day    Packs/day: 1.00    Years: 29.00    Pack years: 29.00    Types: Cigarettes   Smokeless tobacco: Never  Vaping Use   Vaping Use: Never used  Substance Use Topics   Alcohol use: Yes    Alcohol/week: 12.0 standard drinks    Types: 12 Cans  of beer per week    Comment: social   Drug use: No     Family History  Problem Relation Age of Onset   Breast cancer Mother 74   Breast cancer Maternal Aunt    Breast cancer Maternal Grandmother    Breast cancer Maternal Aunt        Maternal Great Aunt    Breast cancer Cousin 31    No Known Allergies   REVIEW OF SYSTEMS (Negative unless checked)  Constitutional: [] Weight loss  [] Fever  [] Chills Cardiac: [] Chest pain   [] Chest pressure   [] Palpitations   [] Shortness of breath when laying flat   [] Shortness of breath at rest   [] Shortness of breath with exertion. Vascular:  [x] Pain in legs with walking   [x] Pain in legs at rest   [x] Pain in legs when laying flat   [] Claudication   [] Pain in feet when walking  [] Pain in feet at rest  [] Pain in feet when laying flat   [] History of DVT   [] Phlebitis   [] Swelling in legs   [] Varicose veins   [x] Non-healing ulcers Pulmonary:   [] Uses home oxygen    [] Productive cough   [] Hemoptysis   [] Wheeze  [] COPD   [] Asthma Neurologic:  [] Dizziness  [] Blackouts   [] Seizures   [] History of stroke   [] History of TIA  [] Aphasia   [] Temporary blindness   [] Dysphagia   [] Weakness or numbness in arms   [] Weakness or numbness in legs Musculoskeletal:  [] Arthritis   [] Joint swelling   [] Joint pain   [] Low back pain Hematologic:  [] Easy bruising  [] Easy bleeding   [] Hypercoagulable state   [] Anemic  [] Hepatitis Gastrointestinal:  [] Blood in stool   [] Vomiting blood  [] Gastroesophageal reflux/heartburn   [] Difficulty swallowing. Genitourinary:  [] Chronic kidney disease   [] Difficult urination  [] Frequent urination  [] Burning with urination   [] Blood in urine Skin:  [] Rashes   [x] Ulcers   [x] Wounds Psychological:  [] History of anxiety   [x]  History of major depression.  Physical Examination  Vitals:   08/28/20 2327 08/29/20 0136 08/29/20 0807 08/29/20 1205  BP: 110/69 118/67 98/62 (!) 92/58  Pulse: 81 74 74 78  Resp: 18 16 17 17   Temp:  97.9 F (36.6 C) 98.3 F (36.8 C) 98.6 F (37 C)  TempSrc:  Oral Oral Oral  SpO2: 100% 96% 100% 99%  Weight:      Height:       Body mass index is 22.85 kg/m. Gen:  WD/WN, NAD Head: [place]/AT, No temporalis wasting.  Ear/Nose/Throat: Hearing grossly intact, nares w/o erythema or drainage, oropharynx w/o Erythema/Exudate Eyes: Sclera non-icteric, conjunctiva clear Neck: Trachea midline.  No JVD.  Pulmonary:  Good air movement, respirations not labored, equal bilaterally.  Cardiac: RRR, normal S1, S2. Vascular:  Vessel Right Left  Radial Palpable Palpable                          PT Trace Palpable 1+ Palpable  DP 2+ Palpable 2+ Palpable   Musculoskeletal: M/S 5/5 throughout.  1+ bilateral lower extremity edema Neurologic: Sensation grossly intact in extremities.  Symmetrical.  Speech is fluent. Motor exam as listed above. Psychiatric: Judgment intact, Mood & affect appropriate for pt's clinical  situation. Dermatologic: Large, dry, ischemic appearing ulceration on the right lateral ankle and top of the foot.  There are a few satellite scabs on the medial aspect as well.  She has a biopsy site on the left leg and a  couple scabs on the left leg but no open wounds.     CBC Lab Results  Component Value Date   WBC 10.5 08/29/2020   HGB 9.8 (L) 08/29/2020   HCT 31.0 (L) 08/29/2020   MCV 78.1 (L) 08/29/2020   PLT 387 08/29/2020    BMET    Component Value Date/Time   NA 140 08/29/2020 0450   NA 133 (L) 11/08/2013 1424   K 4.0 08/29/2020 0450   K 3.7 11/08/2013 1424   CL 110 08/29/2020 0450   CL 100 11/08/2013 1424   CO2 25 08/29/2020 0450   CO2 23 11/08/2013 1424   GLUCOSE 93 08/29/2020 0450   GLUCOSE 92 11/08/2013 1424   BUN 15 08/29/2020 0450   BUN 10 11/08/2013 1424   CREATININE 0.72 08/29/2020 0450   CREATININE 0.73 11/08/2013 1424   CALCIUM 8.2 (L) 08/29/2020 0450   CALCIUM 8.3 (L) 11/08/2013 1424   GFRNONAA >60 08/29/2020 0450   GFRNONAA >60 11/08/2013 1424   GFRAA >60 03/25/2017 1955   GFRAA >60 11/08/2013 1424   Estimated Creatinine Clearance: 68.8 mL/min (by C-G formula based on SCr of 0.72 mg/dL).  COAG Lab Results  Component Value Date   INR 1.1 08/28/2020    Radiology DG Ankle Complete Right  Result Date: 08/28/2020 CLINICAL DATA:  Right ankle wound for 3 months, ulcerations, assess for osteomyelitis EXAM: RIGHT ANKLE - COMPLETE 3+ VIEW COMPARISON:  Radiograph 07/26/2020 FINDINGS: Persistent diffuse soft tissue swelling of the lower extremity with more focal skin thickening and ulcerative defect along the lateral malleolus and distal fibular diaphysis. There may be some early periosteal reaction which could reflect early features of periostitis or early developing osteomyelitis. Small ankle joint effusion. No acute bony abnormality. Specifically, no fracture, subluxation, or dislocation. Degenerative changes in the ankle and imaged portions of the foot.  Posterior calcaneal spurring. IMPRESSION: Diffuse soft tissue swelling or more focal ulceration over the lateral malleolus and distal fibular diaphysis where some early periosteal reaction is suggested. May implicate reactive periostitis or early osteomyelitis. Electronically Signed   By: Kreg Shropshire M.D.   On: 08/28/2020 23:09   US Venous Img Lower Unilateral Right (DVT)  Result Date: 08/29/2020 CLINICAL DATA:  Right leg pain and swelling EXAM: RIGHT LOWER EXTREMITY VENOUS DOPPLER ULTRASOUND TECHNIQUE: Gray-scale sonography with graded compression, as well as color Doppler and duplex ultrasound were performed to evaluate the lower extremity deep venous systems from the level of the common femoral vein and including the common femoral, femoral, profunda femoral, popliteal and calf veins including the posterior tibial, peroneal and gastrocnemius veins when visible. The superficial great saphenous vein was also interrogated. Spectral Doppler was utilized to evaluate flow at rest and with distal augmentation maneuvers in the common femoral, femoral and popliteal veins. COMPARISON:  None. FINDINGS: Contralateral Common Femoral Vein: Respiratory phasicity is normal and symmetric with the symptomatic side. No evidence of thrombus. Normal compressibility. Common Femoral Vein: No evidence of thrombus. Normal compressibility, respiratory phasicity and response to augmentation. Saphenofemoral Junction: No evidence of thrombus. Normal compressibility and flow on color Doppler imaging. Profunda Femoral Vein: No evidence of thrombus. Normal compressibility and flow on color Doppler imaging. Femoral Vein: No evidence of thrombus. Normal compressibility, respiratory phasicity and response to augmentation. Popliteal Vein: No evidence of thrombus. Normal compressibility, respiratory phasicity and response to augmentation. Calf Veins: No evidence of thrombus. Normal compressibility and flow on color Doppler imaging. Other  Findings: Right popliteal fossa Baker's cyst measures 3.2 x 1.0 x  1.4 cm IMPRESSION: Negative for right lower extremity DVT. 3.2 cm right Baker's cyst. Electronically Signed   By: M.  Shick M.D.   On: 08/29/2020 14:46   DG Chest Port 1 View  Result Date: 08/28/2020 CLINICAL DATA:  Sepsis, lower extremity wounds EXAM: PORTABLE CHEST 1 VIEW COMPARISON:  12/20/2019 radiograph, 06/05/2011 CT FINDINGS: Low lung volumes. Patchy and reticular opacities are present in the mid to lower lungs with associated pulmonary vascular congestion, fissural thickening and peribronchial cuffing. Chronic reticular changes are noted on comparison priors as well. No pneumothorax or visible effusion. Stable cardiomediastinal contours. No acute osseous or soft tissue abnormality. IMPRESSION: Coarse reticular and patchy opacities in the lung bases favor superimposed edematous change given vascular congestion, septal thickening and cuffing. Underlying infection is difficult to fully exclude. Low volumes with additional features of atelectasis. Electronically Signed   By: Price  DeHay M.D.   On: 08/28/2020 23:11      Assessment/Plan 1.  Nonhealing ulceration of the right foot and ankle.  Very dry and mall perfused appearing, but she does have a palpable pulse in the foot.  I do think an angiogram still warranted to see if other tibial disease is present that we may be able to improve.  I have discussed the risks and benefits the procedure.  She will be put on the schedule for an angiogram on Monday. 2.  Tobacco use.  Does bring into possibility of Buerger's disease or peripheral arterial disease.    Kassadee Carawan, MD  08/29/2020 3:45 PM    This note was created with Dragon medical transcription system.  Any error is purely unintentional  

## 2020-08-29 NOTE — Progress Notes (Signed)
Pharmacy Antibiotic Note  Alexandra [name] is a 50 y.o. female admitted on 08/28/2020 with  cellulits (suspected diabetic foot infection @ high risk for MRSA) .  Pharmacy has been consulted for Vancomcyin dosing. Also has Rocephin and Metronidazole ordered.  Plan: Will adjust dose to Vancomycin 1250 mg IV Q 24 hrs.  Goal AUC 400-550. Expected AUC: 482.4 Expected Css: 9.6 SCr used: 0.75  Pharmacy will continue to follow and adjust abx dosing whenever warranted.   Height: 5\' 3"  (160 cm) Weight: 58.5 kg (129 lb) IBW/kg (Calculated) : 52.4  Temp (24hrs), Avg:98.4 F (36.9 C), Min:97.9 F (36.6 C), Max:98.7 F (37.1 C)  Recent Labs  Lab 08/28/20 2236 08/29/20 0450  WBC 9.5 10.5  CREATININE 0.75 0.72  LATICACIDVEN 1.4 1.0     Estimated Creatinine Clearance: 68.8 mL/min (by C-G formula based on SCr of 0.72 mg/dL).    No Known Allergies  Antimicrobials this admission: 6/16 Zosyn x 1  6/16 Vancomycin >> 6/17 Ceftriaxone >>  6/17 Flagyl >>   Microbiology results: 6/16 BCx: NG<12hrs 6/16 UCx: Pending   Thank you for allowing pharmacy to be a part of this patient's care.  7/16, PharmD, MBA 08/29/2020 2:00 PM

## 2020-08-29 NOTE — H&P (View-Only) (Signed)
[facility] Vascular Consult Note  MRN : [id number]  Alexandra [name] is a 50 y.o. (1971-01-27) female who presents with chief complaint of  Chief Complaint  Patient presents with   Wound Check  .  History of Present Illness: I am asked to see the patient by Dr. Mayford Knife.  We are seeing the patient for nonhealing ulceration of the right foot and ankle.  This is been present for several weeks to months now.  The patient has severe pain from this ulceration.  She does have a long history of tobacco abuse and there is concern for poor perfusion contributing to the lower extremity wound.  No fevers or chills.  Started on antibiotics here at the hospital.  Current Facility-Administered Medications  Medication Dose Route Frequency Provider Last Rate Last Admin   0.9 %  sodium chloride infusion  250 mL Intravenous PRN Agbata, Tochukwu, MD 10 mL/hr at 08/29/20 1015 Infusion Verify at 08/29/20 1015   cefTRIAXone (ROCEPHIN) 2 g in sodium chloride 0.9 % 100 mL IVPB  2 g Intravenous Q24H Lucile Shutters, MD   Stopped at 08/29/20 0534   cholecalciferol (VITAMIN D) tablet 1,000 Units  1,000 Units Oral Daily Agbata, Tochukwu, MD   1,000 Units at 08/29/20 0825   enoxaparin (LOVENOX) injection 40 mg  40 mg Subcutaneous Q24H Agbata, Tochukwu, MD   40 mg at 08/29/20 0827   ferrous gluconate (FERGON) tablet 324 mg  324 mg Oral BID WC Charise Killian, MD   324 mg at 08/29/20 1542   gabapentin (NEURONTIN) capsule 300 mg  300 mg Oral TID Agbata, Tochukwu, MD   300 mg at 08/29/20 1543   HYDROmorphone (DILAUDID) injection 0.5 mg  0.5 mg Intravenous Q4H PRN Agbata, Tochukwu, MD   0.5 mg at 08/29/20 1233   metroNIDAZOLE (FLAGYL) IVPB 500 mg  500 mg Intravenous Q8H Agbata, Tochukwu, MD   Stopped at 08/29/20 0941   nicotine (NICODERM CQ - dosed in mg/24 hours) patch 21 mg  21 mg Transdermal Daily Agbata, Tochukwu, MD   21 mg at 08/29/20 0826   ondansetron (ZOFRAN) tablet 4 mg  4 mg Oral Q6H  PRN Agbata, Tochukwu, MD       Or   ondansetron (ZOFRAN) injection 4 mg  4 mg Intravenous Q6H PRN Agbata, Tochukwu, MD       sodium chloride flush (NS) 0.9 % injection 3 mL  3 mL Intravenous Q12H Agbata, Tochukwu, MD   3 mL at 08/29/20 0827   sodium chloride flush (NS) 0.9 % injection 3 mL  3 mL Intravenous PRN Agbata, Tochukwu, MD       vancomycin (VANCOREADY) IVPB 1250 mg/250 mL  1,250 mg Intravenous Q24H Bettey Costa, RPH        Past Medical History:  Diagnosis Date   Breast discharge 2013   present X 3 years  Left only multiple ducts per pt   Depression     Past Surgical History:  Procedure Laterality Date   ABDOMINAL HYSTERECTOMY     BREAST CYST EXCISION Left 2006     Social History   Tobacco Use   Smoking status: Every Day    Packs/day: 1.00    Years: 29.00    Pack years: 29.00    Types: Cigarettes   Smokeless tobacco: Never  Vaping Use   Vaping Use: Never used  Substance Use Topics   Alcohol use: Yes    Alcohol/week: 12.0 standard drinks    Types: 12 Cans  of beer per week    Comment: social   Drug use: No     Family History  Problem Relation Age of Onset   Breast cancer Mother 74   Breast cancer Maternal Aunt    Breast cancer Maternal Grandmother    Breast cancer Maternal Aunt        Maternal Great Aunt    Breast cancer Cousin 31    No Known Allergies   REVIEW OF SYSTEMS (Negative unless checked)  Constitutional: [] Weight loss  [] Fever  [] Chills Cardiac: [] Chest pain   [] Chest pressure   [] Palpitations   [] Shortness of breath when laying flat   [] Shortness of breath at rest   [] Shortness of breath with exertion. Vascular:  [x] Pain in legs with walking   [x] Pain in legs at rest   [x] Pain in legs when laying flat   [] Claudication   [] Pain in feet when walking  [] Pain in feet at rest  [] Pain in feet when laying flat   [] History of DVT   [] Phlebitis   [] Swelling in legs   [] Varicose veins   [x] Non-healing ulcers Pulmonary:   [] Uses home oxygen    [] Productive cough   [] Hemoptysis   [] Wheeze  [] COPD   [] Asthma Neurologic:  [] Dizziness  [] Blackouts   [] Seizures   [] History of stroke   [] History of TIA  [] Aphasia   [] Temporary blindness   [] Dysphagia   [] Weakness or numbness in arms   [] Weakness or numbness in legs Musculoskeletal:  [] Arthritis   [] Joint swelling   [] Joint pain   [] Low back pain Hematologic:  [] Easy bruising  [] Easy bleeding   [] Hypercoagulable state   [] Anemic  [] Hepatitis Gastrointestinal:  [] Blood in stool   [] Vomiting blood  [] Gastroesophageal reflux/heartburn   [] Difficulty swallowing. Genitourinary:  [] Chronic kidney disease   [] Difficult urination  [] Frequent urination  [] Burning with urination   [] Blood in urine Skin:  [] Rashes   [x] Ulcers   [x] Wounds Psychological:  [] History of anxiety   [x]  History of major depression.  Physical Examination  Vitals:   08/28/20 2327 08/29/20 0136 08/29/20 0807 08/29/20 1205  BP: 110/69 118/67 98/62 (!) 92/58  Pulse: 81 74 74 78  Resp: 18 16 17 17   Temp:  97.9 F (36.6 C) 98.3 F (36.8 C) 98.6 F (37 C)  TempSrc:  Oral Oral Oral  SpO2: 100% 96% 100% 99%  Weight:      Height:       Body mass index is 22.85 kg/m. Gen:  WD/WN, NAD Head: [place]/AT, No temporalis wasting.  Ear/Nose/Throat: Hearing grossly intact, nares w/o erythema or drainage, oropharynx w/o Erythema/Exudate Eyes: Sclera non-icteric, conjunctiva clear Neck: Trachea midline.  No JVD.  Pulmonary:  Good air movement, respirations not labored, equal bilaterally.  Cardiac: RRR, normal S1, S2. Vascular:  Vessel Right Left  Radial Palpable Palpable                          PT Trace Palpable 1+ Palpable  DP 2+ Palpable 2+ Palpable   Musculoskeletal: M/S 5/5 throughout.  1+ bilateral lower extremity edema Neurologic: Sensation grossly intact in extremities.  Symmetrical.  Speech is fluent. Motor exam as listed above. Psychiatric: Judgment intact, Mood & affect appropriate for pt's clinical  situation. Dermatologic: Large, dry, ischemic appearing ulceration on the right lateral ankle and top of the foot.  There are a few satellite scabs on the medial aspect as well.  She has a biopsy site on the left leg and a  couple scabs on the left leg but no open wounds.     CBC Lab Results  Component Value Date   WBC 10.5 08/29/2020   HGB 9.8 (L) 08/29/2020   HCT 31.0 (L) 08/29/2020   MCV 78.1 (L) 08/29/2020   PLT 387 08/29/2020    BMET    Component Value Date/Time   NA 140 08/29/2020 0450   NA 133 (L) 11/08/2013 1424   K 4.0 08/29/2020 0450   K 3.7 11/08/2013 1424   CL 110 08/29/2020 0450   CL 100 11/08/2013 1424   CO2 25 08/29/2020 0450   CO2 23 11/08/2013 1424   GLUCOSE 93 08/29/2020 0450   GLUCOSE 92 11/08/2013 1424   BUN 15 08/29/2020 0450   BUN 10 11/08/2013 1424   CREATININE 0.72 08/29/2020 0450   CREATININE 0.73 11/08/2013 1424   CALCIUM 8.2 (L) 08/29/2020 0450   CALCIUM 8.3 (L) 11/08/2013 1424   GFRNONAA >60 08/29/2020 0450   GFRNONAA >60 11/08/2013 1424   GFRAA >60 03/25/2017 1955   GFRAA >60 11/08/2013 1424   Estimated Creatinine Clearance: 68.8 mL/min (by C-G formula based on SCr of 0.72 mg/dL).  COAG Lab Results  Component Value Date   INR 1.1 08/28/2020    Radiology DG Ankle Complete Right  Result Date: 08/28/2020 CLINICAL DATA:  Right ankle wound for 3 months, ulcerations, assess for osteomyelitis EXAM: RIGHT ANKLE - COMPLETE 3+ VIEW COMPARISON:  Radiograph 07/26/2020 FINDINGS: Persistent diffuse soft tissue swelling of the lower extremity with more focal skin thickening and ulcerative defect along the lateral malleolus and distal fibular diaphysis. There may be some early periosteal reaction which could reflect early features of periostitis or early developing osteomyelitis. Small ankle joint effusion. No acute bony abnormality. Specifically, no fracture, subluxation, or dislocation. Degenerative changes in the ankle and imaged portions of the foot.  Posterior calcaneal spurring. IMPRESSION: Diffuse soft tissue swelling or more focal ulceration over the lateral malleolus and distal fibular diaphysis where some early periosteal reaction is suggested. May implicate reactive periostitis or early osteomyelitis. Electronically Signed   By: Kreg Shropshire M.D.   On: 08/28/2020 23:09   US Venous Img Lower Unilateral Right (DVT)  Result Date: 08/29/2020 CLINICAL DATA:  Right leg pain and swelling EXAM: RIGHT LOWER EXTREMITY VENOUS DOPPLER ULTRASOUND TECHNIQUE: Gray-scale sonography with graded compression, as well as color Doppler and duplex ultrasound were performed to evaluate the lower extremity deep venous systems from the level of the common femoral vein and including the common femoral, femoral, profunda femoral, popliteal and calf veins including the posterior tibial, peroneal and gastrocnemius veins when visible. The superficial great saphenous vein was also interrogated. Spectral Doppler was utilized to evaluate flow at rest and with distal augmentation maneuvers in the common femoral, femoral and popliteal veins. COMPARISON:  None. FINDINGS: Contralateral Common Femoral Vein: Respiratory phasicity is normal and symmetric with the symptomatic side. No evidence of thrombus. Normal compressibility. Common Femoral Vein: No evidence of thrombus. Normal compressibility, respiratory phasicity and response to augmentation. Saphenofemoral Junction: No evidence of thrombus. Normal compressibility and flow on color Doppler imaging. Profunda Femoral Vein: No evidence of thrombus. Normal compressibility and flow on color Doppler imaging. Femoral Vein: No evidence of thrombus. Normal compressibility, respiratory phasicity and response to augmentation. Popliteal Vein: No evidence of thrombus. Normal compressibility, respiratory phasicity and response to augmentation. Calf Veins: No evidence of thrombus. Normal compressibility and flow on color Doppler imaging. Other  Findings: Right popliteal fossa Baker's cyst measures 3.2 x 1.0 x  1.4 cm IMPRESSION: Negative for right lower extremity DVT. 3.2 cm right Baker's cyst. Electronically Signed   By: Judie PetitM.  Shick M.D.   On: 08/29/2020 14:46   DG Chest Port 1 View  Result Date: 08/28/2020 CLINICAL DATA:  Sepsis, lower extremity wounds EXAM: PORTABLE CHEST 1 VIEW COMPARISON:  12/20/2019 radiograph, 06/05/2011 CT FINDINGS: Low lung volumes. Patchy and reticular opacities are present in the mid to lower lungs with associated pulmonary vascular congestion, fissural thickening and peribronchial cuffing. Chronic reticular changes are noted on comparison priors as well. No pneumothorax or visible effusion. Stable cardiomediastinal contours. No acute osseous or soft tissue abnormality. IMPRESSION: Coarse reticular and patchy opacities in the lung bases favor superimposed edematous change given vascular congestion, septal thickening and cuffing. Underlying infection is difficult to fully exclude. Low volumes with additional features of atelectasis. Electronically Signed   By: Kreg ShropshirePrice  DeHay M.D.   On: 08/28/2020 23:11      Assessment/Plan 1.  Nonhealing ulceration of the right foot and ankle.  Very dry and mall perfused appearing, but she does have a palpable pulse in the foot.  I do think an angiogram still warranted to see if other tibial disease is present that we may be able to improve.  I have discussed the risks and benefits the procedure.  She will be put on the schedule for an angiogram on Monday. 2.  Tobacco use.  Does bring into possibility of Buerger's disease or peripheral arterial disease.    Festus BarrenJason Rayaan Lorah, MD  08/29/2020 3:45 PM    This note was created with Dragon medical transcription system.  Any error is purely unintentional

## 2020-08-30 DIAGNOSIS — D75839 Thrombocytosis, unspecified: Secondary | ICD-10-CM

## 2020-08-30 DIAGNOSIS — M86671 Other chronic osteomyelitis, right ankle and foot: Secondary | ICD-10-CM

## 2020-08-30 DIAGNOSIS — L03115 Cellulitis of right lower limb: Secondary | ICD-10-CM

## 2020-08-30 DIAGNOSIS — D508 Other iron deficiency anemias: Secondary | ICD-10-CM

## 2020-08-30 LAB — BASIC METABOLIC PANEL
Anion gap: 4 — ABNORMAL LOW (ref 5–15)
BUN: 13 mg/dL (ref 6–20)
CO2: 28 mmol/L (ref 22–32)
Calcium: 8.3 mg/dL — ABNORMAL LOW (ref 8.9–10.3)
Chloride: 103 mmol/L (ref 98–111)
Creatinine, Ser: 0.5 mg/dL (ref 0.44–1.00)
GFR, Estimated: >60 mL/min (ref >60)
Glucose, Bld: 88 mg/dL (ref 70–99)
Potassium: 4 mmol/L (ref 3.5–5.1)
Sodium: 135 mmol/L (ref 135–145)

## 2020-08-30 LAB — CBC
HCT: 33.8 % — ABNORMAL LOW (ref 36.0–46.0)
Hemoglobin: 10.3 g/dL — ABNORMAL LOW (ref 12.0–15.0)
MCH: 23.6 pg — ABNORMAL LOW (ref 26.0–34.0)
MCHC: 30.5 g/dL (ref 30.0–36.0)
MCV: 77.5 fL — ABNORMAL LOW (ref 80.0–100.0)
Platelets: 403 10*3/uL — ABNORMAL HIGH (ref 150–400)
RBC: 4.36 MIL/uL (ref 3.87–5.11)
RDW: 17.9 % — ABNORMAL HIGH (ref 11.5–15.5)
WBC: 8.3 10*3/uL (ref 4.0–10.5)
nRBC: 0 % (ref 0.0–0.2)

## 2020-08-30 LAB — MRSA PCR SCREENING: MRSA by PCR: NEGATIVE

## 2020-08-30 MED ORDER — ACETAMINOPHEN 325 MG PO TABS
650.0000 mg | ORAL_TABLET | Freq: Four times a day (QID) | ORAL | Status: DC | PRN
  Administered 2020-08-30 – 2020-09-10 (×2): 650 mg via ORAL
  Filled 2020-08-30: qty 2

## 2020-08-30 NOTE — Progress Notes (Deleted)
Error

## 2020-08-30 NOTE — Consult Note (Signed)
  Subjective:  Patient ID: Alexandra [name], female    DOB: [birth year],  MRN: [id number]  A 50 y.o. 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  She states that starting get a little bit bigger as well.  Patient was seen by me at bedside.  She states she is doing okay.  She has a lot of pain in that ankle.  This has been present for a long period of time.  She denies any other acute complaints.  She would like to discuss treatment options for this.  She is being followed by vascular surgery who is planning on doing an angiogram on Monday. Objective:   Vitals:   08/30/20 0053 08/30/20 0533  BP: (!) 92/58 98/66  Pulse: 85 89  Resp:  16  Temp:  98.4 F (36.9 C)  SpO2:  95%   General AA&O x3. Normal mood and affect.  Vascular Dorsalis pedis and posterior tibial pulses 2/4 bilat. Brisk capillary refill to all digits. Pedal hair present.  Neurologic Epicritic sensation grossly intact.  Dermatologic Right lateral fibular wound that appears to be probing to be deep tissue.  No cellulitis present.  Further regression of the wound noted.  Orthopedic: MMT 5/5 in dorsiflexion, plantarflexion, inversion, and eversion. Normal joint ROM without pain or crepitus.     Assessment & Plan:  Patient was evaluated and treated and all questions answered.  Right lateral wound probing to deep tissue with concern for Buerger's disease with a history of smoking -All questions and concerns were discussed with the patient in extensive detail. -MRI was reviewed that was done on 5/18 which was negative for osteomyelitis. -Radiographs were reviewed from this admission.  Not a strong indication for osteomyelitic changes. -At this time patient will benefit from Xeroform, Betadine wet-to-dry dressing changes every day. -I discussed with the patient that primary goal of this is to  discontinue smoking for which she has agreed. -I also believe she will benefit with debridement and graft application on an outpatient setting after her flow has been restored. -She can be weightbearing as tolerated without any restrictive devices -We will see her in clinic on an outpatient setting 1 week from discharge. -No acute intervention needed during this admission.  Candelaria Stagers, DPM  Accessible via secure chat for questions or concerns.

## 2020-08-30 NOTE — Progress Notes (Signed)
Xeroform dressing placed to R ankle.  Pt leg elevated.

## 2020-08-30 NOTE — Progress Notes (Signed)
TOC consult due to patient being uninsured. CSW spoke to patient. Patient reported she has insurance that will be in effect starting 7/1.  Patient currently uses [facility] for PCP. Patient uses [facility] or [facility] [employer] for medications. Denies issues obtaining medications.  Patient drives herself to appointments.  Patient denied resource needs at this time.  Alfonso Ramus, [place] [phone]

## 2020-08-30 NOTE — Progress Notes (Signed)
PROGRESS NOTE    Alexandra TEBBETTS  FAO:[id number] DOB: 1970-06-01 DOA: 08/28/2020 PCP: [facility]    Assessment & Plan:   Principal Problem:   Osteomyelitis of ankle (HCC) Active Problems:   Tobacco use disorder   Depression   Nonhealing right lateral malleolus ulcer & likely cellulitis: not a strong indication for osteomyelitis as per podiatry. Continue on IV flagyl, vanco, rocephin. RF and P-ANCA were positive. Concern for Buerger's disease w/ hx of smoking. [place] of RLE was neg for DVT. Wound dressings as per podiatry. Would benefit from debridement & graft application as an outpatient as per podiatry. Angiogram of RLE on 09/01/20 as per vascular surg   Depression: severity unknown. Pt stopped taking her home dose of cymbalta, bupropion & aripiprazole   Nicotine dependence: nicotine patch to prevent w/drawal. Smoking cessation counseling   IDA: will continue on iron supplements. No need for a transfusion currently   Thrombocytosis: likely reactive. Will continue to monitor    DVT prophylaxis: lovenox  Code Status:  full  Family Communication:  Disposition Plan: likely d/c back home   Level of care: Med-Surg  Status is: Inpatient  Remains inpatient appropriate because:Ongoing diagnostic testing needed not appropriate for outpatient work up, IV treatments appropriate due to intensity of illness or inability to take PO, and Inpatient level of care appropriate due to severity of illness  Dispo: The patient is from: home               Anticipated d/c is to: Home              Patient currently is not medically stable to d/c.   Difficult to place patient : unclear    Consultants:  Podiatry Vascular surg   Procedures:   Antimicrobials: vanco, flagyl & ceftriaxone    Subjective: Pt c/o right leg pain   Objective: Vitals:   08/30/20 0028 08/30/20 0053 08/30/20 0533 08/30/20 0825  BP: (!) 88/54 (!) 92/58 98/66 (!) 105/58  Pulse: 87 85 89 93  Resp:  17  16 16   Temp: 98.7 F (37.1 C)  98.4 F (36.9 C) 99.1 F (37.3 C)  TempSrc: Oral  Oral Oral  SpO2: 95%  95% 97%  Weight:      Height:        Intake/Output Summary (Last 24 hours) at 08/30/2020 1048 Last data filed at 08/30/2020 0900 Gross per 24 hour  Intake 848.34 ml  Output --  Net 848.34 ml   Filed Weights   08/28/20 1927  Weight: 58.5 kg    Examination:  General exam: Appears comfortable  Respiratory system: clear breath sounds b/l  Cardiovascular system: S1/S2+. No gallops or clicks. R>L LE swelling  Gastrointestinal system: Abd is soft, NT, ND & hypoactive bowel sounds  Central nervous system:  Alert and oriented. Moves all extremities  Extremities: large ulcer over right malleolus  Psychiatry: Judgement and insight appear normal. Appropriate mood and affect      Data Reviewed: I have personally reviewed following labs and imaging studies  CBC: Recent Labs  Lab 08/28/20 2236 08/29/20 0450 08/30/20 0440  WBC 9.5 10.5 8.3  NEUTROABS 5.6  --   --   HGB 10.7* 9.8* 10.3*  HCT 35.0* 31.0* 33.8*  MCV 77.6* 78.1* 77.5*  PLT 499* 387 403*   Basic Metabolic Panel: Recent Labs  Lab 08/28/20 2236 08/29/20 0450 08/30/20 0440  NA 138 140 135  K 3.5 4.0 4.0  CL 105 110 103  CO2  25 25 28   GLUCOSE 88 93 88  BUN 15 15 13   CREATININE 0.75 0.72 0.50  CALCIUM 8.7* 8.2* 8.3*   GFR: Estimated Creatinine Clearance: 68.8 mL/min (by C-G formula based on SCr of 0.5 mg/dL). Liver Function Tests: Recent Labs  Lab 08/28/20 2236  AST 16  ALT 11  ALKPHOS 85  BILITOT 0.3  PROT 7.3  ALBUMIN 3.3*   No results for input(s): LIPASE, AMYLASE in the last 168 hours. No results for input(s): AMMONIA in the last 168 hours. Coagulation Profile: Recent Labs  Lab 08/28/20 2236  INR 1.1   Cardiac Enzymes: No results for input(s): CKTOTAL, CKMB, CKMBINDEX, TROPONINI in the last 168 hours. BNP (last 3 results) No results for input(s): PROBNP in the last 8760  hours. HbA1C: Recent Labs    08/29/20 0450  HGBA1C 5.6   CBG: No results for input(s): GLUCAP in the last 168 hours. Lipid Profile: No results for input(s): CHOL, HDL, LDLCALC, TRIG, CHOLHDL, LDLDIRECT in the last 72 hours. Thyroid Function Tests: No results for input(s): TSH, T4TOTAL, FREET4, T3FREE, THYROIDAB in the last 72 hours. Anemia Panel: No results for input(s): VITAMINB12, FOLATE, FERRITIN, TIBC, IRON, RETICCTPCT in the last 72 hours. Sepsis Labs: Recent Labs  Lab 08/28/20 2236 08/29/20 0450  LATICACIDVEN 1.4 1.0    Recent Results (from the past 240 hour(s))  Culture, blood (single) w Reflex to ID Panel     Status: None (Preliminary result)   Collection Time: 08/28/20 10:36 PM   Specimen: BLOOD  Result Value Ref Range Status   Specimen Description BLOOD RIGHT ANTECUBITAL  Final   Special Requests   Final    BOTTLES DRAWN AEROBIC AND ANAEROBIC Blood Culture adequate volume   Culture   Final    NO GROWTH 2 DAYS Performed at [facility], [address], [place], [address] [place]    Report Status PENDING  Incomplete  Resp Panel by RT-PCR (Flu A&B, Covid) Nasopharyngeal Swab     Status: None   Collection Time: 08/28/20 10:37 PM   Specimen: Nasopharyngeal Swab; Nasopharyngeal(NP) swabs in vial transport medium  Result Value Ref Range Status   SARS Coronavirus 2 by RT PCR NEGATIVE NEGATIVE Final    Comment: (NOTE) SARS-CoV-2 target nucleic acids are NOT DETECTED.  The SARS-CoV-2 RNA is generally detectable in upper respiratory specimens during the acute phase of infection. The lowest concentration of SARS-CoV-2 viral copies this assay can detect is 138 copies/mL. A negative result does not preclude SARS-Cov-2 infection and should not be used as the sole basis for treatment or other patient management decisions. A negative result may occur with  improper specimen collection/handling, submission of specimen other than nasopharyngeal swab, presence of  viral mutation(s) within the areas targeted by this assay, and inadequate number of viral copies(<138 copies/mL). A negative result must be combined with clinical observations, patient history, and epidemiological information. The expected result is Negative.  Fact Sheet for Patients:  20947  Fact Sheet for Healthcare Providers:  08/30/20  This test is no t yet approved or cleared by the [employer] FDA and  has been authorized for detection and/or diagnosis of SARS-CoV-2 by FDA under an Emergency Use Authorization (EUA). This EUA will remain  in effect (meaning this test can be used) for the duration of the COVID-19 declaration under Section 564(b)(1) of the Act, 21 U.S.C.section 360bbb-3(b)(1), unless the authorization is terminated  or revoked sooner.       Influenza A by PCR NEGATIVE NEGATIVE Final  Influenza B by PCR NEGATIVE NEGATIVE Final    Comment: (NOTE) The Xpert Xpress SARS-CoV-2/FLU/RSV plus assay is intended as an aid in the diagnosis of influenza from Nasopharyngeal swab specimens and should not be used as a sole basis for treatment. Nasal washings and aspirates are unacceptable for Xpert Xpress SARS-CoV-2/FLU/RSV testing.  Fact Sheet for Patients: BloggerCourse.com  Fact Sheet for Healthcare Providers: SeriousBroker.it  This test is not yet approved or cleared by the [place] FDA and has been authorized for detection and/or diagnosis of SARS-CoV-2 by FDA under an Emergency Use Authorization (EUA). This EUA will remain in effect (meaning this test can be used) for the duration of the COVID-19 declaration under Section 564(b)(1) of the Act, 21 U.S.C. section 360bbb-3(b)(1), unless the authorization is terminated or revoked.  Performed at [facility], [address] [place], [place] [address]   Blood culture (routine  single)     Status: None (Preliminary result)   Collection Time: 08/28/20 10:39 PM   Specimen: BLOOD  Result Value Ref Range Status   Specimen Description BLOOD LEFT ANTECUBITAL  Final   Special Requests   Final    BOTTLES DRAWN AEROBIC AND ANAEROBIC Blood Culture adequate volume   Culture   Final    NO GROWTH 2 DAYS Performed at [facility], [address] [place], [place] [address]    Report Status PENDING  Incomplete         Radiology Studies: DG Ankle Complete Right  Result Date: 08/28/2020 CLINICAL DATA:  Right ankle wound for 3 months, ulcerations, assess for osteomyelitis EXAM: RIGHT ANKLE - COMPLETE 3+ VIEW COMPARISON:  Radiograph 07/26/2020 FINDINGS: Persistent diffuse soft tissue swelling of the lower extremity with more focal skin thickening and ulcerative defect along the lateral malleolus and distal fibular diaphysis. There may be some early periosteal reaction which could reflect early features of periostitis or early developing osteomyelitis. Small ankle joint effusion. No acute bony abnormality. Specifically, no fracture, subluxation, or dislocation. Degenerative changes in the ankle and imaged portions of the foot. Posterior calcaneal spurring. IMPRESSION: Diffuse soft tissue swelling or more focal ulceration over the lateral malleolus and distal fibular diaphysis where some early periosteal reaction is suggested. May implicate reactive periostitis or early osteomyelitis. Electronically Signed   By: Kreg Shropshire M.D.   On: 08/28/2020 23:09   US Venous Img Lower Unilateral Right (DVT)  Result Date: 08/29/2020 CLINICAL DATA:  Right leg pain and swelling EXAM: RIGHT LOWER EXTREMITY VENOUS DOPPLER ULTRASOUND TECHNIQUE: Gray-scale sonography with graded compression, as well as color Doppler and duplex ultrasound were performed to evaluate the lower extremity deep venous systems from the level of the common femoral vein and including the common femoral, femoral,  profunda femoral, popliteal and calf veins including the posterior tibial, peroneal and gastrocnemius veins when visible. The superficial great saphenous vein was also interrogated. Spectral Doppler was utilized to evaluate flow at rest and with distal augmentation maneuvers in the common femoral, femoral and popliteal veins. COMPARISON:  None. FINDINGS: Contralateral Common Femoral Vein: Respiratory phasicity is normal and symmetric with the symptomatic side. No evidence of thrombus. Normal compressibility. Common Femoral Vein: No evidence of thrombus. Normal compressibility, respiratory phasicity and response to augmentation. Saphenofemoral Junction: No evidence of thrombus. Normal compressibility and flow on color Doppler imaging. Profunda Femoral Vein: No evidence of thrombus. Normal compressibility and flow on color Doppler imaging. Femoral Vein: No evidence of thrombus. Normal compressibility, respiratory phasicity and response to augmentation. Popliteal Vein: No evidence of  thrombus. Normal compressibility, respiratory phasicity and response to augmentation. Calf Veins: No evidence of thrombus. Normal compressibility and flow on color Doppler imaging. Other Findings: Right popliteal fossa Baker's cyst measures 3.2 x 1.0 x 1.4 cm IMPRESSION: Negative for right lower extremity DVT. 3.2 cm right Baker's cyst. Electronically Signed   By: Judie PetitM.  Shick M.D.   On: 08/29/2020 14:46   DG Chest Port 1 View  Result Date: 08/28/2020 CLINICAL DATA:  Sepsis, lower extremity wounds EXAM: PORTABLE CHEST 1 VIEW COMPARISON:  12/20/2019 radiograph, 06/05/2011 CT FINDINGS: Low lung volumes. Patchy and reticular opacities are present in the mid to lower lungs with associated pulmonary vascular congestion, fissural thickening and peribronchial cuffing. Chronic reticular changes are noted on comparison priors as well. No pneumothorax or visible effusion. Stable cardiomediastinal contours. No acute osseous or soft tissue  abnormality. IMPRESSION: Coarse reticular and patchy opacities in the lung bases favor superimposed edematous change given vascular congestion, septal thickening and cuffing. Underlying infection is difficult to fully exclude. Low volumes with additional features of atelectasis. Electronically Signed   By: Kreg ShropshirePrice  DeHay M.D.   On: 08/28/2020 23:11        Scheduled Meds:  vitamin C  250 mg Oral BID   cholecalciferol  1,000 Units Oral Daily   enoxaparin (LOVENOX) injection  40 mg Subcutaneous Q24H   ferrous gluconate  324 mg Oral BID WC   gabapentin  300 mg Oral TID   nicotine  21 mg Transdermal Daily   nutrition supplement (JUVEN)  1 packet Oral BID BM   Ensure Max Protein  11 oz Oral BID   sodium chloride flush  3 mL Intravenous Q12H   Continuous Infusions:  sodium chloride Stopped (08/29/20 1709)   cefTRIAXone (ROCEPHIN)  IV 2 g (08/30/20 0116)   metronidazole 500 mg (08/30/20 0848)   vancomycin Stopped (08/30/20 0427)     LOS: 2 days    Time spent: 30 mins    Charise KillianJamiese M Darly Fails, MD [facility] Pager [phone]  If 7PM-7AM, please contact night-coverage 08/30/2020, 10:48 AM

## 2020-08-31 LAB — BASIC METABOLIC PANEL
Anion gap: 4 — ABNORMAL LOW (ref 5–15)
BUN: 20 mg/dL (ref 6–20)
CO2: 28 mmol/L (ref 22–32)
Calcium: 8.3 mg/dL — ABNORMAL LOW (ref 8.9–10.3)
Chloride: 105 mmol/L (ref 98–111)
Creatinine, Ser: 0.52 mg/dL (ref 0.44–1.00)
GFR, Estimated: >60 mL/min (ref >60)
Glucose, Bld: 99 mg/dL (ref 70–99)
Potassium: 4.1 mmol/L (ref 3.5–5.1)
Sodium: 137 mmol/L (ref 135–145)

## 2020-08-31 LAB — CBC
HCT: 32.4 % — ABNORMAL LOW (ref 36.0–46.0)
Hemoglobin: 10.1 g/dL — ABNORMAL LOW (ref 12.0–15.0)
MCH: 24 pg — ABNORMAL LOW (ref 26.0–34.0)
MCHC: 31.2 g/dL (ref 30.0–36.0)
MCV: 77.1 fL — ABNORMAL LOW (ref 80.0–100.0)
Platelets: 438 10*3/uL — ABNORMAL HIGH (ref 150–400)
RBC: 4.2 MIL/uL (ref 3.87–5.11)
RDW: 17.7 % — ABNORMAL HIGH (ref 11.5–15.5)
WBC: 8.4 10*3/uL (ref 4.0–10.5)
nRBC: 0 % (ref 0.0–0.2)

## 2020-08-31 MED ORDER — POLYETHYLENE GLYCOL 3350 17 G PO PACK
17.0000 g | PACK | Freq: Every day | ORAL | Status: DC
  Filled 2020-08-31 (×2): qty 1

## 2020-08-31 MED ORDER — OXYCODONE-ACETAMINOPHEN 7.5-325 MG PO TABS
1.0000 | ORAL_TABLET | Freq: Four times a day (QID) | ORAL | Status: DC | PRN
  Administered 2020-08-31 – 2020-09-08 (×14): 1 via ORAL
  Filled 2020-08-31 (×14): qty 1

## 2020-08-31 MED ORDER — CEFAZOLIN SODIUM-DEXTROSE 2-4 GM/100ML-% IV SOLN
2.0000 g | INTRAVENOUS | Status: AC
  Administered 2020-09-01: 2 g via INTRAVENOUS
  Filled 2020-08-31: qty 100

## 2020-08-31 MED ORDER — SODIUM CHLORIDE 0.9 % IV SOLN: INTRAVENOUS | Status: DC

## 2020-08-31 MED ORDER — DOCUSATE SODIUM 100 MG PO CAPS
200.0000 mg | ORAL_CAPSULE | Freq: Two times a day (BID) | ORAL | Status: DC
  Administered 2020-08-31: 08:00:00 200 mg via ORAL
  Filled 2020-08-31 (×6): qty 2

## 2020-08-31 NOTE — Progress Notes (Signed)
PROGRESS NOTE    Alexandra [name]  WJX:[id number] DOB: 1970/07/12 DOA: 08/28/2020 PCP: [facility]    Assessment & Plan:   Principal Problem:   Osteomyelitis of ankle (HCC) Active Problems:   Tobacco use disorder   Depression   Nonhealing right lateral malleolus ulcer & likely cellulitis: not a strong indication for osteomyelitis as per podiatry. Continue on IV rocephin, flagyl. D/c vanco. RF and P-ANCA were positive. Concern for Buerger's disease w/ hx of smoking. US of RLE was neg for DVT. Wound dressings as per podiatry. Would benefit from debridement & graft application as an outpatient as per podiatry. Angiogram of RLE on 09/01/20 as per vascular surg   Depression: severity unknown. Stopped taking her home dose of aripiprazole, bupropion, cymbalta   Nicotine dependence: nicotine patch to prevent w/drawals. Received smoking cessation counseling   IDA: will continue on iron supplements. H&H are stable   Thrombocytosis: likely reactive. Will continue to monitor    DVT prophylaxis: lovenox  Code Status:  full  Family Communication:  Disposition Plan: likely d/c back home   Level of care: Med-Surg  Status is: Inpatient  Remains inpatient appropriate because:Ongoing diagnostic testing needed not appropriate for outpatient work up, IV treatments appropriate due to intensity of illness or inability to take PO, and Inpatient level of care appropriate due to severity of illness  Dispo: The patient is from: home               Anticipated d/c is to: Home              Patient currently is not medically stable to d/c.   Difficult to place patient : unclear    Consultants:  Podiatry Vascular surg   Procedures:   Antimicrobials: flagyl, rocephin    Subjective: Pt c/o fatigue   Objective: Vitals:   08/30/20 1723 08/30/20 2108 08/31/20 0116 08/31/20 0517  BP: (!) 92/59 96/63 91/60  (!) 86/60  Pulse: 84 92 89   Resp: 17 16 16 17   Temp: 99.1 F (37.3  C) 99.4 F (37.4 C) 99.2 F (37.3 C) 98.1 F (36.7 C)  TempSrc: Oral Oral Oral   SpO2: 96% 96% 95%   Weight:      Height:        Intake/Output Summary (Last 24 hours) at 08/31/2020 0722 Last data filed at 08/30/2020 0900 Gross per 24 hour  Intake 240 ml  Output --  Net 240 ml   Filed Weights   08/28/20 1927  Weight: 58.5 kg    Examination:  General exam: Appears calm & comfortable  Respiratory system: clear breath sounds b/l. No wheezes Cardiovascular system: S1 &S2+. No gallops or clicks  Gastrointestinal system: Abd is soft, NT, ND & hypoactive bowel sounds Central nervous system:  Alert and oriented. Moves all extremities  Extremities: large ulcer over right malleolus  Psychiatry: judgement and insight appears normal. Flat mood and affect     Data Reviewed: I have personally reviewed following labs and imaging studies  CBC: Recent Labs  Lab 08/28/20 2236 08/29/20 0450 08/30/20 0440 08/31/20 0440  WBC 9.5 10.5 8.3 8.4  NEUTROABS 5.6  --   --   --   HGB 10.7* 9.8* 10.3* 10.1*  HCT 35.0* 31.0* 33.8* 32.4*  MCV 77.6* 78.1* 77.5* 77.1*  PLT 499* 387 403* 438*   Basic Metabolic Panel: Recent Labs  Lab 08/28/20 2236 08/29/20 0450 08/30/20 0440 08/31/20 0440  NA 138 140 135 137  K 3.5 4.0 4.0  4.1  CL 105 110 103 105  CO2 25 25 28 28   GLUCOSE 88 93 88 99  BUN 15 15 13 20   CREATININE 0.75 0.72 0.50 0.52  CALCIUM 8.7* 8.2* 8.3* 8.3*   GFR: Estimated Creatinine Clearance: 68.8 mL/min (by C-G formula based on SCr of 0.52 mg/dL). Liver Function Tests: Recent Labs  Lab 08/28/20 2236  AST 16  ALT 11  ALKPHOS 85  BILITOT 0.3  PROT 7.3  ALBUMIN 3.3*   No results for input(s): LIPASE, AMYLASE in the last 168 hours. No results for input(s): AMMONIA in the last 168 hours. Coagulation Profile: Recent Labs  Lab 08/28/20 2236  INR 1.1   Cardiac Enzymes: No results for input(s): CKTOTAL, CKMB, CKMBINDEX, TROPONINI in the last 168 hours. BNP (last 3  results) No results for input(s): PROBNP in the last 8760 hours. HbA1C: Recent Labs    08/29/20 0450  HGBA1C 5.6   CBG: No results for input(s): GLUCAP in the last 168 hours. Lipid Profile: No results for input(s): CHOL, HDL, LDLCALC, TRIG, CHOLHDL, LDLDIRECT in the last 72 hours. Thyroid Function Tests: No results for input(s): TSH, T4TOTAL, FREET4, T3FREE, THYROIDAB in the last 72 hours. Anemia Panel: No results for input(s): VITAMINB12, FOLATE, FERRITIN, TIBC, IRON, RETICCTPCT in the last 72 hours. Sepsis Labs: Recent Labs  Lab 08/28/20 2236 08/29/20 0450  LATICACIDVEN 1.4 1.0    Recent Results (from the past 240 hour(s))  Culture, blood (single) w Reflex to ID Panel     Status: None (Preliminary result)   Collection Time: 08/28/20 10:36 PM   Specimen: BLOOD  Result Value Ref Range Status   Specimen Description BLOOD RIGHT ANTECUBITAL  Final   Special Requests   Final    BOTTLES DRAWN AEROBIC AND ANAEROBIC Blood Culture adequate volume   Culture   Final    NO GROWTH 3 DAYS Performed at [facility], [address], [place], [address] [place]    Report Status PENDING  Incomplete  Resp Panel by RT-PCR (Flu A&B, Covid) Nasopharyngeal Swab     Status: None   Collection Time: 08/28/20 10:37 PM   Specimen: Nasopharyngeal Swab; Nasopharyngeal(NP) swabs in vial transport medium  Result Value Ref Range Status   SARS Coronavirus 2 by RT PCR NEGATIVE NEGATIVE Final    Comment: (NOTE) SARS-CoV-2 target nucleic acids are NOT DETECTED.  The SARS-CoV-2 RNA is generally detectable in upper respiratory specimens during the acute phase of infection. The lowest concentration of SARS-CoV-2 viral copies this assay can detect is 138 copies/mL. A negative result does not preclude SARS-Cov-2 infection and should not be used as the sole basis for treatment or other patient management decisions. A negative result may occur with  improper specimen collection/handling, submission  of specimen other than nasopharyngeal swab, presence of viral mutation(s) within the areas targeted by this assay, and inadequate number of viral copies(<138 copies/mL). A negative result must be combined with clinical observations, patient history, and epidemiological information. The expected result is Negative.  Fact Sheet for Patients:  74259  Fact Sheet for Healthcare Providers:  08/30/20  This test is no t yet approved or cleared by the [employer] FDA and  has been authorized for detection and/or diagnosis of SARS-CoV-2 by FDA under an Emergency Use Authorization (EUA). This EUA will remain  in effect (meaning this test can be used) for the duration of the COVID-19 declaration under Section 564(b)(1) of the Act, 21 U.S.C.section 360bbb-3(b)(1), unless the authorization is terminated  or revoked  sooner.       Influenza A by PCR NEGATIVE NEGATIVE Final   Influenza B by PCR NEGATIVE NEGATIVE Final    Comment: (NOTE) The Xpert Xpress SARS-CoV-2/FLU/RSV plus assay is intended as an aid in the diagnosis of influenza from Nasopharyngeal swab specimens and should not be used as a sole basis for treatment. Nasal washings and aspirates are unacceptable for Xpert Xpress SARS-CoV-2/FLU/RSV testing.  Fact Sheet for Patients: BloggerCourse.com  Fact Sheet for Healthcare Providers: SeriousBroker.it  This test is not yet approved or cleared by the [place] FDA and has been authorized for detection and/or diagnosis of SARS-CoV-2 by FDA under an Emergency Use Authorization (EUA). This EUA will remain in effect (meaning this test can be used) for the duration of the COVID-19 declaration under Section 564(b)(1) of the Act, 21 U.S.C. section 360bbb-3(b)(1), unless the authorization is terminated or revoked.  Performed at [facility], [address] [place], [place] [address]   Blood culture (routine single)     Status: None (Preliminary result)   Collection Time: 08/28/20 10:39 PM   Specimen: BLOOD  Result Value Ref Range Status   Specimen Description BLOOD LEFT ANTECUBITAL  Final   Special Requests   Final    BOTTLES DRAWN AEROBIC AND ANAEROBIC Blood Culture adequate volume   Culture   Final    NO GROWTH 3 DAYS Performed at [facility] [address] [place], [place] [address]    Report Status PENDING  Incomplete  MRSA PCR Screening     Status: None   Collection Time: 08/30/20  1:32 PM  Result Value Ref Range Status   MRSA by PCR NEGATIVE NEGATIVE Final    Comment:        The GeneXpert MRSA Assay (FDA approved for NASAL specimens only), is one component of a comprehensive MRSA colonization surveillance program. It is not intended to diagnose MRSA infection nor to guide or monitor treatment for MRSA infections. Performed at [facility], [address], [place], [place] [address]          Radiology Studies: US Venous Img Lower Unilateral Right (DVT)  Result Date: 08/29/2020 CLINICAL DATA:  Right leg pain and swelling EXAM: RIGHT LOWER EXTREMITY VENOUS DOPPLER ULTRASOUND TECHNIQUE: Gray-scale sonography with graded compression, as well as color Doppler and duplex ultrasound were performed to evaluate the lower extremity deep venous systems from the level of the common femoral vein and including the common femoral, femoral, profunda femoral, popliteal and calf veins including the posterior tibial, peroneal and gastrocnemius veins when visible. The superficial great saphenous vein was also interrogated. Spectral Doppler was utilized to evaluate flow at rest and with distal augmentation maneuvers in the common femoral, femoral and popliteal veins. COMPARISON:  None. FINDINGS: Contralateral Common Femoral Vein: Respiratory phasicity is normal and symmetric with the symptomatic side. No evidence of  thrombus. Normal compressibility. Common Femoral Vein: No evidence of thrombus. Normal compressibility, respiratory phasicity and response to augmentation. Saphenofemoral Junction: No evidence of thrombus. Normal compressibility and flow on color Doppler imaging. Profunda Femoral Vein: No evidence of thrombus. Normal compressibility and flow on color Doppler imaging. Femoral Vein: No evidence of thrombus. Normal compressibility, respiratory phasicity and response to augmentation. Popliteal Vein: No evidence of thrombus. Normal compressibility, respiratory phasicity and response to augmentation. Calf Veins: No evidence of thrombus. Normal compressibility and flow on color Doppler imaging. Other Findings: Right popliteal fossa Baker's cyst measures 3.2 x 1.0 x 1.4 cm IMPRESSION: Negative for right lower  extremity DVT. 3.2 cm right Baker's cyst. Electronically Signed   By: Judie Petit.  Shick M.D.   On: 08/29/2020 14:46        Scheduled Meds:  vitamin C  250 mg Oral BID   cholecalciferol  1,000 Units Oral Daily   enoxaparin (LOVENOX) injection  40 mg Subcutaneous Q24H   ferrous gluconate  324 mg Oral BID WC   gabapentin  300 mg Oral TID   nicotine  21 mg Transdermal Daily   nutrition supplement (JUVEN)  1 packet Oral BID BM   Ensure Max Protein  11 oz Oral BID   sodium chloride flush  3 mL Intravenous Q12H   Continuous Infusions:  sodium chloride Stopped (08/29/20 1709)   cefTRIAXone (ROCEPHIN)  IV 2 g (08/31/20 0123)   metronidazole 500 mg (08/31/20 0222)   vancomycin Stopped (08/31/20 0015)     LOS: 3 days    Time spent: 31 mins    Charise Killian, MD [facility] Pager [phone]  If 7PM-7AM, please contact night-coverage 08/31/2020, 7:22 AM

## 2020-08-31 NOTE — Progress Notes (Signed)
   Preoperative Note   Procedure: Aortogram, bilateral leg runoff  Date: ? 09/01/20  Preoperative diagnosis: possible Buerger's disease  Consent: to be obtained, all questions answered  Laboratory:  CBC:    Component Value Date/Time   WBC 8.4 08/31/2020 0440   RBC 4.20 08/31/2020 0440   HGB 10.1 (L) 08/31/2020 0440   HGB 13.5 11/08/2013 1424   HCT 32.4 (L) 08/31/2020 0440   HCT 42.1 11/08/2013 1424   PLT 438 (H) 08/31/2020 0440   PLT 304 11/08/2013 1424   MCV 77.1 (L) 08/31/2020 0440   MCV 91 11/08/2013 1424   MCH 24.0 (L) 08/31/2020 0440   MCHC 31.2 08/31/2020 0440   RDW 17.7 (H) 08/31/2020 0440   RDW 14.3 11/08/2013 1424   LYMPHSABS 2.3 08/28/2020 2236   LYMPHSABS 2.3 11/08/2013 1424   MONOABS 0.9 08/28/2020 2236   MONOABS 0.9 11/08/2013 1424   EOSABS 0.6 (H) 08/28/2020 2236   EOSABS 0.3 11/08/2013 1424   BASOSABS 0.1 08/28/2020 2236   BASOSABS 0.1 11/08/2013 1424    BMP:    Component Value Date/Time   NA 137 08/31/2020 0440   NA 133 (L) 11/08/2013 1424   K 4.1 08/31/2020 0440   K 3.7 11/08/2013 1424   CL 105 08/31/2020 0440   CL 100 11/08/2013 1424   CO2 28 08/31/2020 0440   CO2 23 11/08/2013 1424   GLUCOSE 99 08/31/2020 0440   GLUCOSE 92 11/08/2013 1424   BUN 20 08/31/2020 0440   BUN 10 11/08/2013 1424   CREATININE 0.52 08/31/2020 0440   CREATININE 0.73 11/08/2013 1424   CALCIUM 8.3 (L) 08/31/2020 0440   CALCIUM 8.3 (L) 11/08/2013 1424   GFRNONAA >60 08/31/2020 0440   GFRNONAA >60 11/08/2013 1424   GFRAA >60 03/25/2017 1955   GFRAA >60 11/08/2013 1424    Coagulation: Lab Results  Component Value Date   INR 1.1 08/28/2020   No results found for: PTT   Leonides Sake, MD [facility] Office: [phone] Pager: [phone]  08/31/2020, 8:48 AM

## 2020-09-01 ENCOUNTER — Encounter: Payer: Self-pay | Admitting: Vascular Surgery

## 2020-09-01 ENCOUNTER — Encounter
Admission: EM | Disposition: A | Discharge status: Home Health | Payer: Self-pay | Source: Home / Self Care | Attending: Internal Medicine

## 2020-09-01 DIAGNOSIS — I731 Thromboangiitis obliterans [Buerger's disease]: Secondary | ICD-10-CM

## 2020-09-01 DIAGNOSIS — L97513 Non-pressure chronic ulcer of other part of right foot with necrosis of muscle: Secondary | ICD-10-CM

## 2020-09-01 DIAGNOSIS — S91001A Unspecified open wound, right ankle, initial encounter: Secondary | ICD-10-CM

## 2020-09-01 DIAGNOSIS — I776 Arteritis, unspecified: Secondary | ICD-10-CM

## 2020-09-01 DIAGNOSIS — L97519 Non-pressure chronic ulcer of other part of right foot with unspecified severity: Secondary | ICD-10-CM

## 2020-09-01 HISTORY — PX: LOWER EXTREMITY ANGIOGRAPHY: CATH118251

## 2020-09-01 LAB — BASIC METABOLIC PANEL
Anion gap: 7 (ref 5–15)
BUN: 14 mg/dL (ref 6–20)
CO2: 27 mmol/L (ref 22–32)
Calcium: 8.3 mg/dL — ABNORMAL LOW (ref 8.9–10.3)
Chloride: 104 mmol/L (ref 98–111)
Creatinine, Ser: 0.48 mg/dL (ref 0.44–1.00)
GFR, Estimated: >60 mL/min (ref >60)
Glucose, Bld: 91 mg/dL (ref 70–99)
Potassium: 3.9 mmol/L (ref 3.5–5.1)
Sodium: 138 mmol/L (ref 135–145)

## 2020-09-01 LAB — URINALYSIS, COMPLETE (UACMP) WITH MICROSCOPIC
Bilirubin Urine: NEGATIVE
Glucose, UA: NEGATIVE mg/dL
Hgb urine dipstick: NEGATIVE
Ketones, ur: NEGATIVE mg/dL
Leukocytes,Ua: NEGATIVE
Nitrite: NEGATIVE
Protein, ur: NEGATIVE mg/dL
Specific Gravity, Urine: 1.008 (ref 1.005–1.030)
pH: 8 (ref 5.0–8.0)

## 2020-09-01 LAB — CBC
HCT: 34.5 % — ABNORMAL LOW (ref 36.0–46.0)
Hemoglobin: 10.8 g/dL — ABNORMAL LOW (ref 12.0–15.0)
MCH: 23.6 pg — ABNORMAL LOW (ref 26.0–34.0)
MCHC: 31.3 g/dL (ref 30.0–36.0)
MCV: 75.5 fL — ABNORMAL LOW (ref 80.0–100.0)
Platelets: 494 10*3/uL — ABNORMAL HIGH (ref 150–400)
RBC: 4.57 MIL/uL (ref 3.87–5.11)
RDW: 18 % — ABNORMAL HIGH (ref 11.5–15.5)
WBC: 8.4 10*3/uL (ref 4.0–10.5)
nRBC: 0 % (ref 0.0–0.2)

## 2020-09-01 LAB — PREGNANCY, URINE: Preg Test, Ur: NEGATIVE

## 2020-09-01 SURGERY — LOWER EXTREMITY ANGIOGRAPHY
Anesthesia: Moderate Sedation | Laterality: Right

## 2020-09-01 MED ORDER — HEPARIN SODIUM (PORCINE) 1000 UNIT/ML IJ SOLN
INTRAMUSCULAR | Status: AC
  Filled 2020-09-01: qty 1

## 2020-09-01 MED ORDER — MIDAZOLAM HCL 2 MG/2ML IJ SOLN
INTRAMUSCULAR | Status: DC | PRN
  Administered 2020-09-01 (×3): 1 mg via INTRAVENOUS

## 2020-09-01 MED ORDER — SODIUM CHLORIDE 0.9 % IV SOLN
500.0000 mg | Freq: Two times a day (BID) | INTRAVENOUS | Status: AC
  Administered 2020-09-01 – 2020-09-04 (×6): 500 mg via INTRAVENOUS
  Filled 2020-09-01 (×6): qty 4

## 2020-09-01 MED ORDER — AMOXICILLIN-POT CLAVULANATE 875-125 MG PO TABS
1.0000 | ORAL_TABLET | Freq: Two times a day (BID) | ORAL | Status: DC
  Administered 2020-09-01 – 2020-09-03 (×4): 1 via ORAL
  Filled 2020-09-01 (×4): qty 1

## 2020-09-01 MED ORDER — FENTANYL CITRATE (PF) 100 MCG/2ML IJ SOLN
INTRAMUSCULAR | Status: DC | PRN
  Administered 2020-09-01 (×3): 25 ug via INTRAVENOUS

## 2020-09-01 MED ORDER — MIDAZOLAM HCL 5 MG/5ML IJ SOLN
INTRAMUSCULAR | Status: AC
  Filled 2020-09-01: qty 5

## 2020-09-01 MED ORDER — FENTANYL CITRATE (PF) 100 MCG/2ML IJ SOLN
INTRAMUSCULAR | Status: AC
  Filled 2020-09-01: qty 2

## 2020-09-01 SURGICAL SUPPLY — 8 items
CATH ANGIO 5F PIGTAIL 65CM (CATHETERS) ×1 IMPLANT
COVER PROBE U/S 5X48 (MISCELLANEOUS) ×1 IMPLANT
DEVICE STARCLOSE SE CLOSURE (Vascular Products) ×1 IMPLANT
PACK ANGIOGRAPHY (CUSTOM PROCEDURE TRAY) ×2 IMPLANT
SHEATH BRITE TIP 5FRX11 (SHEATH) ×1 IMPLANT
SYR MEDRAD MARK 7 150ML (SYRINGE) ×1 IMPLANT
TUBING CONTRAST HIGH PRESS 72 (TUBING) ×1 IMPLANT
WIRE GUIDERIGHT .035X150 (WIRE) ×2 IMPLANT

## 2020-09-01 NOTE — Consult Note (Signed)
Reason for Consult: Leg ulcer.  Concern for vasculitis  Referring Physician: Hospitalist.  ID consultant.  Gilda Crease   HPI: 50 year old white female.  Works as a Child psychotherapist  About 4 months ago she started having joint pain.  Knees ankles.  Could hardly work.  Be very stiff in the morning.  May have had some swelling.  Took some Aleve.  Was seen in the emergency room on more than 1 occasion.  Had elevated sedimentation rate.  Concerned about rheumatoid arthritis.  Sed rate 90 and then 78. Joints improved.  Then developed a rash mainly over the right leg and ankle.  Some over the left.  Small blisters.  Then began to ulcerate.  Had hospitalization 2 months ago.  Thought to be cellulitis.  Had antibiotics.  Felt like 2 of the areas in the right ankle improved but not on the lateral aspect.  During that evaluation she had abnormal labs.  Positive rheumatoid factor of 1.650.  Positive ANCA P ANCA.  1-1 60.  Thrombocytosis.  Mild anemia with low iron.  Elevated sed rate and CRP.  She had MRI that did not show any osteomyelitis. She has had progressive and enlarging wound.  She had dermatology opinion.  Dermatology had biopsy x3.  Communicated to Dr. Sampson Goon infectious disease that he felt this was a medium vessel vasculitis. She was readmitted with pain and worsening ulceration now several centimeters of the right ankle.  She had ultrasound showing Baker's cyst in the right knee.  Chest x-ray shows patchy reticular nodular pattern.  Creatinine normal.  Urinalysis normal. Arteriogram showed poor small vessels in the foot and ankle.  No definite beading.  Most recent sedimentation rate 54.  CRP 2.2.  Normal creatinine normal liver functions. No Raynaud's.  No history of sinus disease.  Weight loss 10 pounds.  PMH: Negative for the connective tissue diseases  SURGICAL HISTORY: No joint surgery  Family History: No history of rheumatoid arthritis or lupus  Social History: Smokes.  No  illegal drugs  Allergies: No Known Allergies  Medications: Scheduled:  amoxicillin-clavulanate  1 tablet Oral Q12H   vitamin C  250 mg Oral BID   cholecalciferol  1,000 Units Oral Daily   docusate sodium  200 mg Oral BID   enoxaparin (LOVENOX) injection  40 mg Subcutaneous Q24H   fentaNYL       ferrous gluconate  324 mg Oral BID WC   gabapentin  300 mg Oral TID   heparin sodium (porcine)       midazolam       nicotine  21 mg Transdermal Daily   nutrition supplement (JUVEN)  1 packet Oral BID BM   polyethylene glycol  17 g Oral Daily   Ensure Max Protein  11 oz Oral BID   sodium chloride flush  3 mL Intravenous Q12H        ROS: No abdominal pain.  Does have numbness in the feet.  Tingling.  Not in the hands.  No fever.   PHYSICAL EXAM: Blood pressure 112/66, pulse 89, temperature 98.6 F (37 C), temperature source Oral, resp. rate 13, height 5\' 3"  (1.6 m), weight 58.5 kg, last menstrual period 01/14/2009, SpO2 99 %. Pleasant female.  Sclera clear.  Crackles both bases.  No murmur.  Nontender abdomen.  Good distal pulses.  10 sonometer ulcer on the right lateral ankle.  2 smaller ulcers which have healed in the medial aspect of the right ankle on the anterior aspect of  the left foot.  No petechial rash.  No significant edema Musculoskeletal: Good range of motion neck and shoulders.  No synovitis wrists MCPs PIPs.  No definite effusion of the knee or ankle Neurologic: Good pin and vibratory sense.  Has trace ankle jerks bilaterally  Assessment: Vasculitis, medium vessel.  Prior consistent biopsy and arteriogram evidence of small vessel poor flow.  Positive p-ANCA and positive rheumatoid factor.  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  Had inflammatory arthritis and peripheral neuropathy and abnormal chest x-ray as part of the clinical picture.   Less likely rheumatoid vasculitis(does not fit clinical picture though she does have high titer antibodies)  Recommendations: Discussed  with patient. Begin Solu-Medrol 1 g daily in divided doses for 3 days Other serology sent.  Anti-CCP antibody, hepatitis B, ANCA antibodies PR-3 and MPO.  Cryoglobulins  Would ask hospitalist to consider chest CT.  Better to delineate her reticular nodular lesions and determine whether they appear like a vasculitis  After steroid taper would consider giving her loading dose of rituximab.  Would like to see other antibodies first.  That would require agreement of hematology  Kandyce Rud 09/01/2020, 5:22 PM

## 2020-09-01 NOTE — Op Note (Signed)
[facility]  Percutaneous Study/Intervention Procedural Note   Date of Surgery: 08/28/2020 - 09/01/2020  Surgeon(s):Coleby Yett    Assistants:none  Pre-operative Diagnosis: Nonhealing ulceration right lower extremity  Post-operative diagnosis:  Same  Procedure(s) Performed:             1.  Ultrasound guidance for vascular access left femoral artery             2.  Catheter placement into right SFA from left femoral approach             3.  Aortogram and selective right lower extremity angiogram             4.  StarClose closure device left femoral artery  EBL: 5 cc  Contrast: 25 cc  Fluoro Time: 1.3 minutes  Moderate Conscious Sedation Time: approximately 31 minutes using 3 mg of Versed and 75 mcg of Fentanyl              Indications:  Patient is a 50 y.o.female with a severe nonhealing ulceration of the right foot and ankle. The patient is brought in for angiography for further evaluation and potential treatment.  Due to the limb threatening nature of the situation, angiogram was performed for attempted limb salvage. The patient is aware that if the procedure fails, amputation would be expected.  The patient also understands that even with successful revascularization, amputation may still be required due to the severity of the situation.  Risks and benefits are discussed and informed consent is obtained.   Procedure:  The patient was identified and appropriate procedural time out was performed.  The patient was then placed supine on the table and prepped and draped in the usual sterile fashion. Moderate conscious sedation was administered during a face to face encounter with the patient throughout the procedure with my supervision of the RN administering medicines and monitoring the patient's vital signs, pulse oximetry, telemetry and mental status throughout from the start of the procedure until the patient was taken to the recovery room. Ultrasound was used to  evaluate the left common femoral artery.  It was patent .  A digital ultrasound image was acquired.  A Seldinger needle was used to access the left common femoral artery under direct ultrasound guidance and a permanent image was performed.  A 0.035 J wire was advanced without resistance and a 5Fr sheath was placed.  Pigtail catheter was placed into the aorta and an AP aortogram was performed. This demonstrated normal renal arteries and normal aorta and iliac segments without significant stenosis. I then crossed the aortic bifurcation and advanced to the right femoral head. Selective right lower extremity angiogram was then performed.  For the final image of the foot and ankle, the catheter is advanced into the mid to distal superficial femoral artery to help opacify distally.  This demonstrated normal common femoral artery, profunda femoris artery was small but normal, superficial femoral artery and popliteal arteries were normal.  There was a typical tibial trifurcation with three-vessel runoff although all 3 vessels were small with poor flow into the foot and small vessel disease in the foot and ankle.  Although there were not the pathognomonic corkscrew appearance of Buerger's disease, the small vessel disease is consistent with Buerger's disease.  There was no role for intervention in this case. I elected to terminate the procedure. The sheath was removed and StarClose closure device was deployed in the left femoral artery with excellent hemostatic result. The patient was taken  to the recovery room in stable condition having tolerated the procedure well.  Findings:               Aortogram: Normal renal arteries, normal aorta and iliac arteries without significant stenosis             Right lower Extremity: Normal common femoral artery, profunda femoris artery was small but normal, superficial femoral artery and popliteal arteries were normal.  There was a typical tibial trifurcation with three-vessel runoff  although all 3 vessels were small with poor flow into the foot and small vessel disease in the foot and ankle.  Although there were not the pathognomonic corkscrew appearance of Buerger's disease, the small vessel disease is consistent with Buerger's disease   Disposition: Patient was taken to the recovery room in stable condition having tolerated the procedure well.  Complications: None  Festus Barren 09/01/2020 12:00 PM   This note was created with Dragon Medical transcription system. Any errors in dictation are purely unintentional.

## 2020-09-01 NOTE — Consult Note (Addendum)
NAME: Alexandra [name]  DOB: [birth year]  MRN: [id number]  Date/Time: 09/01/2020 1:36 PM  REQUESTING PROVIDER: Dr. Mayford Knife Subjective:  REASON FOR CONSULT: Right ankle wound ? Alexandra [name] is a 50 y.o. female with no significant past medical history presents with worsening right ankle wound. Patient initially noted a rash on her feet about 2 months ago.  She also had foot pain.  She presented to the hospital on Jul 26, 2020 and was hospitalized.  ID had seen her then.  At that time she had a lateral malleoli or ulcerating wound on the right foot and was started on ceftriaxone and minocycline.  She did not have any fever.  Lab work revealed a normal hepatitis ANC, PE was negative HIV was reactive but confirmatory test and viral load was negative.  She Had a mildly elevated platelet count.  Immunological work-up was sent.  There was a concern for impetigo but wound culture was negative.  She was discharged on doxycycline to be followed with dermatologist.  After discharge the rheumatoid factor came back very high at > 1:640 and P ANCA was also positive As per Dr. 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  And a referral was made to rheumatology.  Patient presented to the ED on 08/29/2020 with right lower extremity pain.  She was also complaining of the ulcer which has not been healing for the past few months and also worsening discharge. In the ED vitals were BP of 110/69, temperature 98.7, heart rate 81, sats of 100%.  Labs revealed sodium 138, potassium 3.5, creatinine 0.75, alkaline phosphatase 85, total protein 7.3, white count of 9.5, Hb of 10.7, platelet of 499. Right ankle x-ray showed diffuse soft tissue swelling Chest x-ray showed coarse reticular and patchy opacities She was started on ceftriaxone and Flagyl.  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  Past Medical History:  Diagnosis Date   Breast  discharge 2013   present X 3 years  Left only multiple ducts per pt   Depression     Past Surgical History:  Procedure Laterality Date   ABDOMINAL HYSTERECTOMY     BREAST CYST EXCISION Left 2006    Social History   Socioeconomic History   Marital status: Divorced    Spouse name: Not on file   Number of children: Not on file   Years of education: Not on file   Highest education level: Not on file  Occupational History   Not on file  Tobacco Use   Smoking status: Every Day    Packs/day: 1.00    Years: 29.00    Pack years: 29.00    Types: Cigarettes   Smokeless tobacco: Never  Vaping Use   Vaping Use: Never used  Substance and Sexual Activity   Alcohol use: Yes    Alcohol/week: 12.0 standard drinks    Types: 12 Cans of beer per week    Comment: social   Drug use: No   Sexual activity: Yes  Other Topics Concern   Not on file  Social History Narrative   Not on file   Social Determinants of Health   Financial Resource Strain: Not on file  Food Insecurity: Not on file  Transportation Needs: Not on file  Physical Activity: Not on file  Stress: Not on file  Social Connections: Not on file  Intimate Partner Violence: Not on file    Family History  Problem  Relation Age of Onset   Breast cancer Mother 56   Breast cancer Maternal Aunt    Breast cancer Maternal Grandmother    Breast cancer Maternal Aunt        Maternal Great Aunt    Breast cancer Cousin 31   No Known Allergies I? Current Facility-Administered Medications  Medication Dose Route Frequency Provider Last Rate Last Admin   0.9 %  sodium chloride infusion  250 mL Intravenous PRN Agbata, Tochukwu, MD   Stopped at 08/29/20 1709   acetaminophen (TYLENOL) tablet 650 mg  650 mg Oral Q6H PRN Manuela Schwartz, NP   650 mg at 08/30/20 2357   ascorbic acid (VITAMIN C) tablet 250 mg  250 mg Oral BID Charise Killian, MD   250 mg at 09/01/20 1331   cefTRIAXone (ROCEPHIN) 2 g in sodium chloride 0.9 % 100 mL IVPB   2 g Intravenous Q24H Agbata, Tochukwu, MD 200 mL/hr at 09/01/20 0110 2 g at 09/01/20 0110   cholecalciferol (VITAMIN D) tablet 1,000 Units  1,000 Units Oral Daily Agbata, Tochukwu, MD   1,000 Units at 09/01/20 1331   docusate sodium (COLACE) capsule 200 mg  200 mg Oral BID Charise Killian, MD   200 mg at 08/31/20 0800   enoxaparin (LOVENOX) injection 40 mg  40 mg Subcutaneous Q24H Agbata, Tochukwu, MD   40 mg at 08/31/20 0739   fentaNYL (SUBLIMAZE) 100 MCG/2ML injection            ferrous gluconate (FERGON) tablet 324 mg  324 mg Oral BID WC Charise Killian, MD   324 mg at 08/31/20 1622   gabapentin (NEURONTIN) capsule 300 mg  300 mg Oral TID Agbata, Tochukwu, MD   300 mg at 08/31/20 2247   heparin sodium (porcine) 1000 UNIT/ML injection            HYDROmorphone (DILAUDID) injection 0.5 mg  0.5 mg Intravenous Q4H PRN Agbata, Tochukwu, MD   0.5 mg at 08/31/20 1933   metroNIDAZOLE (FLAGYL) IVPB 500 mg  500 mg Intravenous Q8H Agbata, Tochukwu, MD 100 mL/hr at 09/01/20 0107 500 mg at 09/01/20 0107   midazolam (VERSED) 5 MG/5ML injection            nicotine (NICODERM CQ - dosed in mg/24 hours) patch 21 mg  21 mg Transdermal Daily Agbata, Tochukwu, MD   21 mg at 08/31/20 0732   nutrition supplement (JUVEN) (JUVEN) powder packet 1 packet  1 packet Oral BID BM Charise Killian, MD   1 packet at 08/31/20 0740   ondansetron (ZOFRAN) tablet 4 mg  4 mg Oral Q6H PRN Agbata, Tochukwu, MD   4 mg at 08/30/20 1325   Or   ondansetron (ZOFRAN) injection 4 mg  4 mg Intravenous Q6H PRN Agbata, Tochukwu, MD   4 mg at 08/31/20 2249   oxyCODONE-acetaminophen (PERCOCET) 7.5-325 MG per tablet 1 tablet  1 tablet Oral Q6H PRN Charise Killian, MD   1 tablet at 09/01/20 1331   polyethylene glycol (MIRALAX / GLYCOLAX) packet 17 g  17 g Oral Daily Charise Killian, MD       protein supplement (ENSURE MAX) liquid  11 oz Oral BID Charise Killian, MD   11 oz at 08/31/20 2253   sodium chloride flush (NS) 0.9 %  injection 3 mL  3 mL Intravenous Q12H Agbata, Tochukwu, MD   3 mL at 08/31/20 2250   sodium chloride flush (NS) 0.9 % injection 3 mL  3 mL  Intravenous PRN Agbata, Tochukwu, MD         Abtx:  Anti-infectives (From admission, onward)    Start     Dose/Rate Route Frequency Ordered Stop   08/31/20 2305  ceFAZolin (ANCEF) IVPB 2g/100 mL premix        2 g 200 mL/hr over 30 Minutes Intravenous 30 min pre-op 08/31/20 2305 09/01/20 1333   08/29/20 2200  vancomycin (VANCOREADY) IVPB 1250 mg/250 mL  Status:  Discontinued        1,250 mg 166.7 mL/hr over 90 Minutes Intravenous Every 24 hours 08/29/20 1359 08/31/20 0723   08/29/20 1400  vancomycin (VANCOREADY) IVPB 750 mg/150 mL  Status:  Discontinued        750 mg 150 mL/hr over 60 Minutes Intravenous Every 12 hours 08/29/20 0208 08/29/20 1359   08/29/20 0130  metroNIDAZOLE (FLAGYL) IVPB 500 mg        500 mg 100 mL/hr over 60 Minutes Intravenous Every 8 hours 08/29/20 0022     08/29/20 0100  cefTRIAXone (ROCEPHIN) 2 g in sodium chloride 0.9 % 100 mL IVPB        2 g 200 mL/hr over 30 Minutes Intravenous Every 24 hours 08/29/20 0022     08/28/20 2330  vancomycin (VANCOREADY) IVPB 1250 mg/250 mL        1,250 mg 166.7 mL/hr over 90 Minutes Intravenous  Once 08/28/20 2315 08/29/20 0330   08/28/20 2330  piperacillin-tazobactam (ZOSYN) IVPB 3.375 g        3.375 g 100 mL/hr over 30 Minutes Intravenous  Once 08/28/20 2315 08/28/20 2359       REVIEW OF SYSTEMS:  Const: negative fever, negative chills, 10 pound weight loss in 2 weeks Eyes: negative diplopia or visual changes, negative eye pain ENT: negative coryza, negative sore throat Resp: negative cough, hemoptysis, dyspnea Cards: negative for chest pain, palpitations, lower extremity edema GU: negative for frequency, dysuria and hematuria GI: Negative for abdominal pain, diarrhea, bleeding, constipation Skin: As above General heme: negative for easy bruising and gum/nose bleeding MS:  Weakness Neurolo:negative for headaches, dizziness, vertigo, memory problems  Psych: negative for feelings of anxiety, depression  Endocrine: negative for thyroid, diabetes Allergy/Immunology- negative for any medication or food allergies ?  Objective:  VITALS:  BP 112/66   Pulse 89   Temp 98.6 F (37 C) (Oral)   Resp 13   Ht 5\' 3"  (1.6 m)   Wt 58.5 kg   LMP 01/14/2009   SpO2 99%   BMI 22.85 kg/m  PHYSICAL EXAM:  General: Alert, cooperative, no distress, appears stated age.  Head: Normocephalic, without obvious abnormality, atraumatic. Eyes: Conjunctivae clear, anicteric sclerae. Pupils are equal ENT Nares normal. No drainage or sinus tenderness. Lips, mucosa, and tongue normal. No Thrush Neck: Supple, symmetrical, no adenopathy, thyroid: non tender no carotid bruit and no JVD. Back: No CVA tenderness. Lungs: Bilateral air entry.  Coarse crepitations up to midlung zone Heart regular rate and rhythm, no murmur, rub or gallop. Abdomen: Soft, non-tender,not distended. Bowel sounds normal. No masses Extremities: Right ankle on the lateral malleolus there is some 6 cm scabbed wound with some minimal discharge   Skin: No rashes or lesions. Or bruising Lymph: Cervical, supraclavicular normal. Neurologic: Grossly non-focal Pertinent Labs Lab Results CBC    Component Value Date/Time   WBC 8.4 09/01/2020 0620   RBC 4.57 09/01/2020 0620   HGB 10.8 (L) 09/01/2020 0620   HGB 13.5 11/08/2013 1424   HCT 34.5 (L) 09/01/2020 09/03/2020  HCT 42.1 11/08/2013 1424   PLT 494 (H) 09/01/2020 0620   PLT 304 11/08/2013 1424   MCV 75.5 (L) 09/01/2020 0620   MCV 91 11/08/2013 1424   MCH 23.6 (L) 09/01/2020 0620   MCHC 31.3 09/01/2020 0620   RDW 18.0 (H) 09/01/2020 0620   RDW 14.3 11/08/2013 1424   LYMPHSABS 2.3 08/28/2020 2236   LYMPHSABS 2.3 11/08/2013 1424   MONOABS 0.9 08/28/2020 2236   MONOABS 0.9 11/08/2013 1424   EOSABS 0.6 (H) 08/28/2020 2236   EOSABS 0.3 11/08/2013 1424    BASOSABS 0.1 08/28/2020 2236   BASOSABS 0.1 11/08/2013 1424    CMP Latest Ref Rng & Units 09/01/2020 08/31/2020 08/30/2020  Glucose 70 - 99 mg/dL 91 99 88  BUN 6 - 20 mg/dL 14 20 13   Creatinine 0.44 - 1.00 mg/dL 0.93 8.18  Sodium 135 - 145 mmol/L 138 137 135  Potassium 3.5 - 5.1 mmol/L 3.9 4.1 4.0  Chloride 98 - 111 mmol/L 104 105 103  CO2 22 - 32 mmol/L 27 28 28   Calcium 8.9 - 10.3 mg/dL 8.3(L) 8.3(L) 8.3(L)  Total Protein 6.5 - 8.1 g/dL - - -  Total Bilirubin 0.3 - 1.2 mg/dL - - -  Alkaline Phos 38 - 126 U/L - - -  AST 15 - 41 U/L - - -  ALT 0 - 44 U/L - - -      Microbiology: Recent Results (from the past 240 hour(s))  Culture, blood (single) w Reflex to ID Panel     Status: None (Preliminary result)   Collection Time: 08/28/20 10:36 PM   Specimen: BLOOD  Result Value Ref Range Status   Specimen Description BLOOD RIGHT ANTECUBITAL  Final   Special Requests   Final    BOTTLES DRAWN AEROBIC AND ANAEROBIC Blood Culture adequate volume   Culture   Final    NO GROWTH 3 DAYS Performed at [facility], [address], [place], [address] [place]    Report Status PENDING  Incomplete  Resp Panel by RT-PCR (Flu A&B, Covid) Nasopharyngeal Swab     Status: None   Collection Time: 08/28/20 10:37 PM   Specimen: Nasopharyngeal Swab; Nasopharyngeal(NP) swabs in vial transport medium  Result Value Ref Range Status   SARS Coronavirus 2 by RT PCR NEGATIVE NEGATIVE Final    Comment: (NOTE) SARS-CoV-2 target nucleic acids are NOT DETECTED.  The SARS-CoV-2 RNA is generally detectable in upper respiratory specimens during the acute phase of infection. The lowest concentration of SARS-CoV-2 viral copies this assay can detect is 138 copies/mL. A negative result does not preclude SARS-Cov-2 infection and should not be used as the sole basis for treatment or other patient management decisions. A negative result may occur with  improper specimen collection/handling, submission of  specimen other than nasopharyngeal swab, presence of viral mutation(s) within the areas targeted by this assay, and inadequate number of viral copies(<138 copies/mL). A negative result must be combined with clinical observations, patient history, and epidemiological information. The expected result is Negative.  Fact Sheet for Patients:  37169  Fact Sheet for Healthcare Providers:  08/30/20  This test is no t yet approved or cleared by the [employer] FDA and  has been authorized for detection and/or diagnosis of SARS-CoV-2 by FDA under an Emergency Use Authorization (EUA). This EUA will remain  in effect (meaning this test can be used) for the duration of the COVID-19 declaration under Section 564(b)(1) of the Act, 21 U.S.C.section 360bbb-3(b)(1), unless the authorization  is terminated  or revoked sooner.       Influenza A by PCR NEGATIVE NEGATIVE Final   Influenza B by PCR NEGATIVE NEGATIVE Final    Comment: (NOTE) The Xpert Xpress SARS-CoV-2/FLU/RSV plus assay is intended as an aid in the diagnosis of influenza from Nasopharyngeal swab specimens and should not be used as a sole basis for treatment. Nasal washings and aspirates are unacceptable for Xpert Xpress SARS-CoV-2/FLU/RSV testing.  Fact Sheet for Patients: [employer]comhttps://www.fda.gov/media/152166/download  Fact Sheet for Healthcare Providers: SeriousBroker.ithttps://www.fda.gov/media/152162/download  This test is not yet approved or cleared by the [place] FDA and has been authorized for detection and/or diagnosis of SARS-CoV-2 by FDA under an Emergency Use Authorization (EUA). This EUA will remain in effect (meaning this test can be used) for the duration of the COVID-19 declaration under Section 564(b)(1) of the Act, 21 U.S.C. section 360bbb-3(b)(1), unless the authorization is terminated or revoked.  Performed at [facility], [address] [place], [place] [address]   Blood culture (routine single)     Status: None (Preliminary result)   Collection Time: 08/28/20 10:39 PM   Specimen: BLOOD  Result Value Ref Range Status   Specimen Description BLOOD LEFT ANTECUBITAL  Final   Special Requests   Final    BOTTLES DRAWN AEROBIC AND ANAEROBIC Blood Culture adequate volume   Culture   Final    NO GROWTH 3 DAYS Performed at [facility], [address] [place], [place] [address]    Report Status PENDING  Incomplete  MRSA PCR Screening     Status: None   Collection Time: 08/30/20  1:32 PM  Result Value Ref Range Status   MRSA by PCR NEGATIVE NEGATIVE Final    Comment:        The GeneXpert MRSA Assay (FDA approved for NASAL specimens only), is one component of a comprehensive MRSA colonization surveillance program. It is not intended to diagnose MRSA infection nor to guide or monitor treatment for MRSA infections. Performed at [facility], [address] [place], [place] [address]     IMAGING RESULTS:  I have personally reviewed the films ?Coarse reticular and patchy opacities in the lung bases   Impression/Recommendation 50 year old female presenting with 6857-month history of right ankle wound on the lateral aspect.  No improvement to antibiotics.  Also has positive rheumatoid factor and P ANCA Right ankle wound.  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 Infection is unlikely.  We will do a culture.  Can continue cefazolin and changed to p.o. Augmentin till the culture is back. She will need disc rheumatological consult because of positive p-ANCA and rheumatoid factor. She is going to need steroids and immunosuppressive therapy  She is got lung findings of coarse crepitations up to mid lungs.  Will need a CT chest.  In the previous admission HIV preliminary test was reactive but negative confirmatory test and viral load. ? Current  smoker. ___________________________________________________ Discussed with patient, requesting provider Recommend rheumatology consult. Note:  This document was prepared using Dragon voice recognition software and may include unintentional dictation errors.

## 2020-09-01 NOTE — Progress Notes (Signed)
PROGRESS NOTE    Alexandra [name]  FMB:[id number] DOB: [birth year] DOA: 08/28/2020 PCP: [facility]    Assessment & Plan:   Principal Problem:   Osteomyelitis of ankle (HCC) Active Problems:   Tobacco use disorder   Depression   Nonhealing right lateral malleolus ulcer & likely cellulitis: not a strong indication for osteomyelitis as per podiatry. Continue on IV rocephin, flagyl. D/c vanco. RF and P-ANCA were positive. Concern for Buerger's disease w/ hx of smoking. [place] of RLE was neg for DVT. Wound dressings as per podiatry. Would benefit from debridement & graft application as an outpatient as per podiatry. Will go for angiogram today as per vascular surg. ID consulted   Depression: severity unknown. Stopped taking her home dose of bupropion, cymbalta & ariprazole   Nicotine dependence: nicotine patch to prevent w/drawal. Received smoking cessation counseling   IDA: continue on Fe supplements. No need for a transfusion currently   Thrombocytosis: likely reactive, etiology unclear. Will continue to monitor    DVT prophylaxis: lovenox  Code Status:  full  Family Communication:  Disposition Plan: likely d/c back home   Level of care: Med-Surg  Status is: Inpatient  Remains inpatient appropriate because:Ongoing diagnostic testing needed not appropriate for outpatient work up, IV treatments appropriate due to intensity of illness or inability to take PO, and Inpatient level of care appropriate due to severity of illness, angiogram today   Dispo: The patient is from: home               Anticipated d/c is to: Home              Patient currently is not medically stable to d/c.   Difficult to place patient : unclear    Consultants:  Podiatry Vascular surg   Procedures:   Antimicrobials: flagyl, rocephin    Subjective: Pt c/o malaise   Objective: Vitals:   08/31/20 1137 08/31/20 1525 09/01/20 0009 09/01/20 0400  BP: (!) 95/53 98/67 (!) 90/44 (!)  96/56  Pulse: 77 72 76 77  Resp: 16 18 18 16   Temp: 98.4 F (36.9 C) 97.6 F (36.4 C) 98.1 F (36.7 C) 97.8 F (36.6 C)  TempSrc: Oral Oral Oral Oral  SpO2: 96% 99% 98% 98%  Weight:      Height:        Intake/Output Summary (Last 24 hours) at 09/01/2020 0723 Last data filed at 09/01/2020 0000 Gross per 24 hour  Intake 960 ml  Output --  Net 960 ml   Filed Weights   08/28/20 1927  Weight: 58.5 kg    Examination:  General exam: Appears comfortable  Respiratory system: clear breath sounds b/l Cardiovascular system: S1 & S2+. No rubs or clicks  Gastrointestinal system: Abd is soft, NT, ND & hypoactive bowel sounds  Central nervous system:  Alert and oriented. Moves all extremities   Extremities: large ulcer over right malleolus  Psychiatry: judgement and insight appear normal. Flat mood and affect    Data Reviewed: I have personally reviewed following labs and imaging studies  CBC: Recent Labs  Lab 08/28/20 2236 08/29/20 0450 08/30/20 0440 08/31/20 0440 09/01/20 0620  WBC 9.5 10.5 8.3 8.4 8.4  NEUTROABS 5.6  --   --   --   --   HGB 10.7* 9.8* 10.3* 10.1* 10.8*  HCT 35.0* 31.0* 33.8* 32.4* 34.5*  MCV 77.6* 78.1* 77.5* 77.1* 75.5*  PLT 499* 387 403* 438* 494*   Basic Metabolic Panel: Recent Labs  Lab  08/28/20 2236 08/29/20 0450 08/30/20 0440 08/31/20 0440  NA 138 140 135 137  K 3.5 4.0 4.0 4.1  CL 105 110 103 105  CO2 25 25 28 28   GLUCOSE 88 93 88 99  BUN 15 15 13 20   CREATININE 0.75 0.72 0.50 0.52  CALCIUM 8.7* 8.2* 8.3* 8.3*   GFR: Estimated Creatinine Clearance: 68.8 mL/min (by C-G formula based on SCr of 0.52 mg/dL). Liver Function Tests: Recent Labs  Lab 08/28/20 2236  AST 16  ALT 11  ALKPHOS 85  BILITOT 0.3  PROT 7.3  ALBUMIN 3.3*   No results for input(s): LIPASE, AMYLASE in the last 168 hours. No results for input(s): AMMONIA in the last 168 hours. Coagulation Profile: Recent Labs  Lab 08/28/20 2236  INR 1.1   Cardiac  Enzymes: No results for input(s): CKTOTAL, CKMB, CKMBINDEX, TROPONINI in the last 168 hours. BNP (last 3 results) No results for input(s): PROBNP in the last 8760 hours. HbA1C: No results for input(s): HGBA1C in the last 72 hours.  CBG: No results for input(s): GLUCAP in the last 168 hours. Lipid Profile: No results for input(s): CHOL, HDL, LDLCALC, TRIG, CHOLHDL, LDLDIRECT in the last 72 hours. Thyroid Function Tests: No results for input(s): TSH, T4TOTAL, FREET4, T3FREE, THYROIDAB in the last 72 hours. Anemia Panel: No results for input(s): VITAMINB12, FOLATE, FERRITIN, TIBC, IRON, RETICCTPCT in the last 72 hours. Sepsis Labs: Recent Labs  Lab 08/28/20 2236 08/29/20 0450  LATICACIDVEN 1.4 1.0    Recent Results (from the past 240 hour(s))  Culture, blood (single) w Reflex to ID Panel     Status: None (Preliminary result)   Collection Time: 08/28/20 10:36 PM   Specimen: BLOOD  Result Value Ref Range Status   Specimen Description BLOOD RIGHT ANTECUBITAL  Final   Special Requests   Final    BOTTLES DRAWN AEROBIC AND ANAEROBIC Blood Culture adequate volume   Culture   Final    NO GROWTH 3 DAYS Performed at [facility], [address], [place], [address] [place]    Report Status PENDING  Incomplete  Resp Panel by RT-PCR (Flu A&B, Covid) Nasopharyngeal Swab     Status: None   Collection Time: 08/28/20 10:37 PM   Specimen: Nasopharyngeal Swab; Nasopharyngeal(NP) swabs in vial transport medium  Result Value Ref Range Status   SARS Coronavirus 2 by RT PCR NEGATIVE NEGATIVE Final    Comment: (NOTE) SARS-CoV-2 target nucleic acids are NOT DETECTED.  The SARS-CoV-2 RNA is generally detectable in upper respiratory specimens during the acute phase of infection. The lowest concentration of SARS-CoV-2 viral copies this assay can detect is 138 copies/mL. A negative result does not preclude SARS-Cov-2 infection and should not be used as the sole basis for treatment or other  patient management decisions. A negative result may occur with  improper specimen collection/handling, submission of specimen other than nasopharyngeal swab, presence of viral mutation(s) within the areas targeted by this assay, and inadequate number of viral copies(<138 copies/mL). A negative result must be combined with clinical observations, patient history, and epidemiological information. The expected result is Negative.  Fact Sheet for Patients:  27035  Fact Sheet for Healthcare Providers:  08/30/20  This test is no t yet approved or cleared by the [employer] FDA and  has been authorized for detection and/or diagnosis of SARS-CoV-2 by FDA under an Emergency Use Authorization (EUA). This EUA will remain  in effect (meaning this test can be used) for the duration of the COVID-19  declaration under Section 564(b)(1) of the Act, 21 U.S.C.section 360bbb-3(b)(1), unless the authorization is terminated  or revoked sooner.       Influenza A by PCR NEGATIVE NEGATIVE Final   Influenza B by PCR NEGATIVE NEGATIVE Final    Comment: (NOTE) The Xpert Xpress SARS-CoV-2/FLU/RSV plus assay is intended as an aid in the diagnosis of influenza from Nasopharyngeal swab specimens and should not be used as a sole basis for treatment. Nasal washings and aspirates are unacceptable for Xpert Xpress SARS-CoV-2/FLU/RSV testing.  Fact Sheet for Patients: [employer]com  Fact Sheet for Healthcare Providers: SeriousBroker.it  This test is not yet approved or cleared by the [place] FDA and has been authorized for detection and/or diagnosis of SARS-CoV-2 by FDA under an Emergency Use Authorization (EUA). This EUA will remain in effect (meaning this test can be used) for the duration of the COVID-19 declaration under Section 564(b)(1) of the Act, 21 U.S.C. section  360bbb-3(b)(1), unless the authorization is terminated or revoked.  Performed at [facility], [address] [place], [place] [address]   Blood culture (routine single)     Status: None (Preliminary result)   Collection Time: 08/28/20 10:39 PM   Specimen: BLOOD  Result Value Ref Range Status   Specimen Description BLOOD LEFT ANTECUBITAL  Final   Special Requests   Final    BOTTLES DRAWN AEROBIC AND ANAEROBIC Blood Culture adequate volume   Culture   Final    NO GROWTH 3 DAYS Performed at [facility], [address], [place], [place] [address]    Report Status PENDING  Incomplete  MRSA PCR Screening     Status: None   Collection Time: 08/30/20  1:32 PM  Result Value Ref Range Status   MRSA by PCR NEGATIVE NEGATIVE Final    Comment:        The GeneXpert MRSA Assay (FDA approved for NASAL specimens only), is one component of a comprehensive MRSA colonization surveillance program. It is not intended to diagnose MRSA infection nor to guide or monitor treatment for MRSA infections. Performed at [facility], [address] [place], [place] [address]          Radiology Studies: No results found.      Scheduled Meds:  vitamin C  250 mg Oral BID   cholecalciferol  1,000 Units Oral Daily   docusate sodium  200 mg Oral BID   enoxaparin (LOVENOX) injection  40 mg Subcutaneous Q24H   ferrous gluconate  324 mg Oral BID WC   gabapentin  300 mg Oral TID   nicotine  21 mg Transdermal Daily   nutrition supplement (JUVEN)  1 packet Oral BID BM   polyethylene glycol  17 g Oral Daily   Ensure Max Protein  11 oz Oral BID   sodium chloride flush  3 mL Intravenous Q12H   Continuous Infusions:  sodium chloride Stopped (08/29/20 1709)   sodium chloride 20 mL/hr at 09/01/20 0106    ceFAZolin (ANCEF) IV     cefTRIAXone (ROCEPHIN)  IV 2 g (09/01/20 0110)   metronidazole 500 mg (09/01/20 0107)     LOS: 4 days    Time spent: 30  mins    Charise Killian, MD [facility] Pager [phone]  If 7PM-7AM, please contact night-coverage 09/01/2020, 7:23 AM

## 2020-09-01 NOTE — Interval H&P Note (Signed)
History and Physical Interval Note:  09/01/2020 10:24 AM  Alexandra [name]  has presented today for surgery, with the diagnosis of ulceration right foot.  The various methods of treatment have been discussed with the patient and family. After consideration of risks, benefits and other options for treatment, the patient has consented to  Procedure(s): Lower Extremity Angiography (Right) as a surgical intervention.  The patient's history has been reviewed, patient examined, no change in status, stable for surgery.  I have reviewed the patient's chart and labs.  Questions were answered to the patient's satisfaction.     Festus Barren

## 2020-09-02 ENCOUNTER — Inpatient Hospital Stay: Payer: Medicaid Other

## 2020-09-02 ENCOUNTER — Encounter: Payer: Self-pay | Admitting: Internal Medicine

## 2020-09-02 DIAGNOSIS — M86161 Other acute osteomyelitis, right tibia and fibula: Secondary | ICD-10-CM

## 2020-09-02 DIAGNOSIS — S81801A Unspecified open wound, right lower leg, initial encounter: Secondary | ICD-10-CM

## 2020-09-02 DIAGNOSIS — R600 Localized edema: Secondary | ICD-10-CM

## 2020-09-02 LAB — CBC
HCT: 36.1 % (ref 36.0–46.0)
Hemoglobin: 11.4 g/dL — ABNORMAL LOW (ref 12.0–15.0)
MCH: 23.8 pg — ABNORMAL LOW (ref 26.0–34.0)
MCHC: 31.6 g/dL (ref 30.0–36.0)
MCV: 75.2 fL — ABNORMAL LOW (ref 80.0–100.0)
Platelets: 537 10*3/uL — ABNORMAL HIGH (ref 150–400)
RBC: 4.8 MIL/uL (ref 3.87–5.11)
RDW: 17.8 % — ABNORMAL HIGH (ref 11.5–15.5)
WBC: 5.5 10*3/uL (ref 4.0–10.5)
nRBC: 0 % (ref 0.0–0.2)

## 2020-09-02 LAB — BASIC METABOLIC PANEL
Anion gap: 8 (ref 5–15)
BUN: 17 mg/dL (ref 6–20)
CO2: 25 mmol/L (ref 22–32)
Calcium: 9 mg/dL (ref 8.9–10.3)
Chloride: 106 mmol/L (ref 98–111)
Creatinine, Ser: 0.45 mg/dL (ref 0.44–1.00)
GFR, Estimated: >60 mL/min (ref >60)
Glucose, Bld: 160 mg/dL — ABNORMAL HIGH (ref 70–99)
Potassium: 4 mmol/L (ref 3.5–5.1)
Sodium: 139 mmol/L (ref 135–145)

## 2020-09-02 LAB — OCCULT BLOOD X 1 CARD TO LAB, STOOL: Fecal Occult Bld: NEGATIVE

## 2020-09-02 LAB — URINE CULTURE: Culture: NO GROWTH

## 2020-09-02 LAB — CULTURE, BLOOD (SINGLE)
Culture: NO GROWTH
Culture: NO GROWTH
Special Requests: ADEQUATE
Special Requests: ADEQUATE

## 2020-09-02 MED ORDER — CITALOPRAM HYDROBROMIDE 20 MG PO TABS
20.0000 mg | ORAL_TABLET | Freq: Every day | ORAL | Status: DC
  Administered 2020-09-02 – 2020-09-16 (×15): 20 mg via ORAL
  Filled 2020-09-02 (×15): qty 1

## 2020-09-02 MED ORDER — LOPERAMIDE HCL 2 MG PO CAPS
2.0000 mg | ORAL_CAPSULE | Freq: Two times a day (BID) | ORAL | Status: DC | PRN
  Administered 2020-09-04: 2 mg via ORAL
  Filled 2020-09-02: qty 1

## 2020-09-02 MED ORDER — IOHEXOL 300 MG/ML  SOLN
75.0000 mL | Freq: Once | INTRAMUSCULAR | Status: AC | PRN
  Administered 2020-09-02: 75 mL via INTRAVENOUS

## 2020-09-02 MED ORDER — LOPERAMIDE HCL 2 MG PO CAPS
2.0000 mg | ORAL_CAPSULE | ORAL | Status: DC | PRN
  Administered 2020-09-02 (×2): 2 mg via ORAL
  Filled 2020-09-02 (×2): qty 1

## 2020-09-02 MED ORDER — PANTOPRAZOLE SODIUM 40 MG PO TBEC
40.0000 mg | DELAYED_RELEASE_TABLET | Freq: Every day | ORAL | Status: DC
  Administered 2020-09-02 – 2020-09-16 (×15): 40 mg via ORAL
  Filled 2020-09-02 (×15): qty 1

## 2020-09-02 NOTE — Progress Notes (Signed)
Date of Admission:  08/28/2020    ID: Alexandra [name] is a 50 y.o. female  Principal Problem:   Osteomyelitis of ankle (HCC) Active Problems:   Tobacco use disorder   Depression    Subjective: Black stool Has loose stools since she took a dose of dulcolax   Medications:   amoxicillin-clavulanate  1 tablet Oral Q12H   vitamin C  250 mg Oral BID   cholecalciferol  1,000 Units Oral Daily   docusate sodium  200 mg Oral BID   enoxaparin (LOVENOX) injection  40 mg Subcutaneous Q24H   ferrous gluconate  324 mg Oral BID WC   gabapentin  300 mg Oral TID   nicotine  21 mg Transdermal Daily   nutrition supplement (JUVEN)  1 packet Oral BID BM   polyethylene glycol  17 g Oral Daily   Ensure Max Protein  11 oz Oral BID   sodium chloride flush  3 mL Intravenous Q12H    Objective: Vital signs in last 24 hours: Temp:  [97.6 F (36.4 C)-98.8 F (37.1 C)] 98.4 F (36.9 C) (06/21 1311) Pulse Rate:  [76-98] 96 (06/21 1311) Resp:  [16-20] 18 (06/21 1311) BP: (89-110)/(46-69) 105/59 (06/21 1311) SpO2:  [96 %-99 %] 98 % (06/21 1311)  PHYSICAL EXAM:  General: Alert, cooperative, no distress, appears stated age.  Head: Normocephalic, without obvious abnormality, atraumatic. Eyes: Conjunctivae clear, anicteric sclerae. Pupils are equal ENT Nares normal. No drainage or sinus tenderness. Lips, mucosa, and tongue normal. No Thrush Neck: Supple, symmetrical, no adenopathy, thyroid: non tender no carotid bruit and no JVD. Back: No CVA tenderness. Lungs: b/l coarse crepts to midzone Heart: Regular rate and rhythm, no murmur, rub or gallop. Abdomen: Soft, non-tender,not distended. Bowel sounds normal. No masses Extremities: rt ankle dressing not removed Skin: No rashes or lesions. Or bruising Lymph: Cervical, supraclavicular normal. Neurologic: Grossly non-focal  Lab Results Recent Labs    09/01/20 0620 09/02/20 0432  WBC 8.4 5.5  HGB 10.8* 11.4*  HCT 34.5* 36.1  NA 138 139  K  3.9 4.0  CL 104 106  CO2 27 25  BUN 14 17  CREATININE 0.48 0.45   Liver Panel No results for input(s): PROT, ALBUMIN, AST, ALT, ALKPHOS, BILITOT, BILIDIR, IBILI in the last 72 hours. Sedimentation Rate No results for input(s): ESRSEDRATE in the last 72 hours. C-Reactive Protein No results for input(s): CRP in the last 72 hours.  Microbiology:  Studies/Results: CT CHEST W CONTRAST  Result Date: 09/02/2020 CLINICAL DATA:  Pulmonary nodule. EXAM: CT CHEST WITH CONTRAST TECHNIQUE: Multidetector CT imaging of the chest was performed during intravenous contrast administration. CONTRAST:  36mL OMNIPAQUE IOHEXOL 300 MG/ML  SOLN COMPARISON:  06/05/2011 FINDINGS: Cardiovascular: The heart size is normal. No substantial pericardial effusion. No thoracic aortic aneurysm. Mediastinum/Nodes: 14 mm short axis right paratracheal node is similar to prior suggesting benign/reactive etiology. Small prevascular lymph nodes are also stable. Small lymph nodes in the hilar regions and subcarinal station are similar to prior. The esophagus has normal imaging features. There is no axillary lymphadenopathy. Lungs/Pleura: Subpleural reticulation noted in both lungs with a basilar predominance. Small patchy areas of ground-glass opacity are seen in both lungs. No dense focal airspace consolidation. No pleural effusion. Upper Abdomen: Unremarkable. Musculoskeletal: No worrisome lytic or sclerotic osseous abnormality. IMPRESSION: 1. Subpleural reticulation in both lungs with a basilar predominance. Small patchy areas of ground-glass opacity are seen in both lungs. Imaging features suggest underlying component of pulmonary fibrosis in UIP would  be a consideration. Follow-up high-resolution chest CT recommended for more definitive evaluation. 2. Stable mild mediastinal and hilar lymphadenopathy, likely reactive. 3. Aortic Atherosclerosis (ICD10-I70.0). Electronically Signed   By: Kennith Center M.D.   On: 09/02/2020 15:09    PERIPHERAL VASCULAR CATHETERIZATION  Result Date: 09/01/2020 See surgical note for result.    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 P-ANCA positive Seen by rheumatologist and started on high-dose Solu-Medrol. Appreciate Dr. [place]ill[place] consult CT chest shows pulmonary fibrosis.  Right ankle wound.  On Augmentin.  Wound culture has been sent.  BLACK loose stool- she was on NSAID at home- r/o GI bleed  Discussed the management with the patient

## 2020-09-02 NOTE — Progress Notes (Signed)
[facility] Daily Progress Note   09/01/20             1.  Ultrasound guidance for vascular access left femoral artery             2.  Catheter placement into right SFA from left femoral approach             3.  Aortogram and selective right lower extremity angiogram             4.  StarClose closure device left femoral artery  Subjective: Patient is without complaints this AM.  No acute issues overnight.  Patient is without complaint.  Objective: Vitals:   09/01/20 1923 09/02/20 0001 09/02/20 0407 09/02/20 0715  BP: (!) 90/53 (!) 89/59 (!) 91/55 (!) 91/51  Pulse: 92 78 78 76  Resp: 16 16 16 16   Temp: 98.8 F (37.1 C) 98 F (36.7 C) 97.8 F (36.6 C) 98.4 F (36.9 C)  TempSrc:    Oral  SpO2: 96% 97% 96% 96%  Weight:      Height:        Intake/Output Summary (Last 24 hours) at 09/02/2020 1117 Last data filed at 09/02/2020 0950 Gross per 24 hour  Intake 828 ml  Output --  Net 828 ml   Physical Exam: A&Ox3, NAD CV: RRR Pulmonary: CTA Bilaterally Abdomen: Soft, Nontender, Nondistended Left access site:  Dressing is clean dry and intact.  No swelling or drainage noted. Vascular:  Right lower extremity: Thigh soft.  Calf soft. Extremities warm distally to toes. Soft. Warm. Good capillary refill.  Laboratory: CBC    Component Value Date/Time   WBC 5.5 09/02/2020 0432   HGB 11.4 (L) 09/02/2020 0432   HGB 13.5 11/08/2013 1424   HCT 36.1 09/02/2020 0432   HCT 42.1 11/08/2013 1424   PLT 537 (H) 09/02/2020 0432   PLT 304 11/08/2013 1424   BMET    Component Value Date/Time   NA 139 09/02/2020 0432   NA 133 (L) 11/08/2013 1424   K 4.0 09/02/2020 0432   K 3.7 11/08/2013 1424   CL 106 09/02/2020 0432   CL 100 11/08/2013 1424   CO2 25 09/02/2020 0432   CO2 23 11/08/2013 1424   GLUCOSE 160 (H) 09/02/2020 0432   GLUCOSE 92 11/08/2013 1424   BUN 17 09/02/2020 0432   BUN 10 11/08/2013 1424   CREATININE 0.45 09/02/2020 0432   CREATININE 0.73  11/08/2013 1424   CALCIUM 9.0 09/02/2020 0432   CALCIUM 8.3 (L) 11/08/2013 1424   GFRNONAA >60 09/02/2020 0432   GFRNONAA >60 11/08/2013 1424   GFRAA >60 03/25/2017 1955   GFRAA >60 11/08/2013 1424   Assessment/Planning: The patient is a 50 year old female who presents with 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  There was a typical tibial trifurcation with three-vessel runoff although all 3 vessels were small with poor flow into the foot and small vessel disease in the foot and ankle.  Although there were not the pathognomonic corkscrew appearance of Buerger's disease, the small vessel disease is consistent with Buerger's disease.  2) Access site is clean dry and intact  3) Patient is on aspirin as an outpatient.  Okay to continue this.  4) Vascular surgery will sign off at this time. Since there  was no intervention the patient does not need to follow-up with [place] in the outpatient setting.  Discussed with Dr. Wallis Mart Darly Fails PA-C 09/02/2020 11:17 AM

## 2020-09-02 NOTE — Consult Note (Signed)
WOC Nurse Consult Note: Patient receiving care in [facility]. Reason for Consult: right ankle wound, concern for vasculitis Wound type: full thickness vasculitic wound of right lateral malleolus Pressure Injury POA: Yes/No/NA Measurement: 5.2 cm x 5.8 cm Wound bed: crusting and blackened, with lifting necrosis of the skin at the area of the tattoo. Drainage (amount, consistency, odor) scant drainage on existing xeroform gauze dressing. Periwound: intact Dressing procedure/placement/frequency: After liberally moistening existing dressing with saline, and a slow and very gently approach, I was able to remove the existing dressing.  Vasculitic wounds are well known for their excruciating pain.  Then I dressed the wound with Mepitel and a few turns of Kerlix.   I have entered the orders for bedside staff as follows: VERY gently remove existing dressing to right lateral malleolus. Saturate with saline if needed to ease removal. Place a mepitel silicone dressing over the wound. Secure with a few turns of kerlix.  Change every 2 days and prn soilage.  I explained to the patient that there is not a dressing that can be placed over the wound to solve the underlying cause.  The underlying cause must be treated with medications that she will swallow, or be given IV.  She voiced her understanding.  Monitor the wound area(s) for worsening of condition such as: Signs/symptoms of infection,  Increase in size,  Development of or worsening of odor, Development of pain, or increased pain at the affected locations.  Notify the medical team if any of these develop.  Thank you for the consult.  Discussed plan of care with the patient and bedside nurse.  WOC nurse will not follow at this time.  Please re-consult the WOC team if needed.  Helmut Muster, RN, MSN, CWOCN, CNS-BC, pager [phone]

## 2020-09-02 NOTE — Progress Notes (Signed)
PROGRESS NOTE   HPI was taken from Dr. Joylene Igo: Alexandra [name] is a 50 y.o. 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 Patient states that she has had this ulcer for 3 months, it is nonhealing and appears to be extending to the anterior surface of her right leg.  She rates her pain a 10 x 10 in intensity at its worst and pain is usually at rest.  When she is ambulating or standing on her feet she does not feel any pain. She has increased purulent drainage from the wound but denies having any fever or chills.  She has felt unwell and complains of nausea without any emesis or abdominal pain. She was hospitalized 1 month ago and was discharged home on doxycycline which she completed without any significant improvement.  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  Patient was referred to rheumatology and has an appointment scheduled for July 7. She denies having any chest pain,  no headache, no cough, no abdominal pain, no changes in her bowel habits, no dizziness, no lightheadedness no urinary symptoms or changes in her bowel habits. Labs showed sodium 138, potassium 3.5, chloride 105, bicarb 25, glucose 88, BUN 15, creatinine 0.75, calcium 8.7, alkaline phosphatase 85, albumin 3.3, AST 16, ALT 11, total protein 7.3, lactic acid 1.4, white count 9.5, hemoglobin 10.7, hematocrit 35.0, MCV 77.6, RDW 18.2, platelet count 499, PT 13.9, INR 1.1 Respiratory viral panel is negative Right ankle x-ray shows diffuse soft tissue swelling or more focal ulceration over the lateral malleolus and distal fibular  diaphysis where some early periosteal reaction is suggested. May implicate reactive periostitis or early osteomyelitis. Chest x-ray reviewed by me shows coarse reticular and patchy opacities in the lung bases favor superimposed edematous change given vascular congestion, septal thickening and cuffing. Underlying infection is difficult to fully exclude. Low volumes with additional features of atelectasis. Twelve-lead EKG reviewed by me shows sinus rhythm.       ED Course: Patient is a 50 year old female who presents to the ER for evaluation of pain and increased purulent drainage from a chronic ulcer over her right ankle. Imaging shows early periosteal reaction which could be reactive periostitis versus early osteomyelitis. She will be admitted to the hospital for further evaluation.   Hospital course from Dr. Mayford Knife 6/17-6/21/22: Pt was found to have nonhealing right lateral malleolus ulcer & possible cellulitis. Pt has a positive RF and P-ANCA and there is a concern for vasculitis. Rheumatology and ID are following the pt. Other serologies were sent as per rheum (see rheum's note). Pt was started on IV steroids x 3 days and CT chest ordered to further evaluate reticular nodular lesions in the lungs was ordered as per rheum.Of note, pt is now on po augmentin as per ID and podiatry no longer recommends f/u w/ them outpatient for possible debridement but to f/u w/ a wound care center and consider hyperbaric oxygen to help w/ wound healing.   RICARDO KAYES  ZOX:[id number] DOB: Mar 16, 1970 DOA: 08/28/2020 PCP: [facility]    Assessment & Plan:   Principal Problem:   Osteomyelitis of ankle [facility]) Active Problems:   Tobacco  use disorder   Depression   Nonhealing right lateral malleolus ulcer & likely cellulitis: switched to po augmentin as per ID. RF and P-ANCA were positive, concern for vasculitis. CT chest ordered to further assess reticular nodular lesions in the lungs as  per rheum. Continue on IV steroids x 3 days as per rheum. Concern for Buerger's disease w/ hx of smoking. US of RLE was neg for DVT. Wound care consulted. Podiatry no longer recommends f/u w/ them outpatient for possible debridement but to f/u w/ a wound care center and consider hyperbaric oxygen to help w/ wound healing. 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 Stopped taking her home dose of bupropion, cymbalta & ariprazole   Nicotine dependence: nicotine patch to prevent w/drawal. Received smoking cessation counseling   IDA: continue on iron supplements. H&H are stable.   Thrombocytosis: likely reactive, etiology unclear. Will continue to monitor    DVT prophylaxis: lovenox  Code Status:  full  Family Communication:  Disposition Plan: likely d/c back home   Level of care: Med-Surg  Status is: Inpatient  Remains inpatient appropriate because:Ongoing diagnostic testing needed not appropriate for outpatient work up, IV treatments appropriate due to intensity of illness or inability to take PO, and Inpatient level of care appropriate due to severity of illness, CT chest today and started on IV steroids   Dispo: The patient is from: home               Anticipated d/c is to: Home              Patient currently is not medically stable to d/c.   Difficult to place patient : unclear    Consultants:  Podiatry Vascular surg   Procedures:   Antimicrobials: augmentin    Subjective: Pt c/o fatigue   Objective: Vitals:   09/01/20 1923 09/02/20 0001 09/02/20 0407 09/02/20 0715  BP: (!) 90/53 (!) 89/59 (!) 91/55 (!) 91/51  Pulse: 92 78 78 76  Resp: 16 16 16 16   Temp: 98.8 F (37.1 C) 98 F (36.7 C) 97.8 F (36.6 C) 98.4 F (36.9 C)  TempSrc:    Oral  SpO2: 96% 97% 96% 96%  Weight:      Height:        Intake/Output Summary (Last 24 hours) at 09/02/2020 0717 Last  data filed at 09/02/2020 0600 Gross per 24 hour  Intake 588 ml  Output --  Net 588 ml   Filed Weights   08/28/20 1927 09/01/20 0937  Weight: 58.5 kg 58.5 kg    Examination:  General exam: Appears calm & comfortable  Respiratory system: diminished breath sounds b/l  Cardiovascular system: S1/S2+. No rubs or clicks Gastrointestinal system: Abd is soft, NT, ND & hypoactive bowel sounds Central nervous system: Alert and oriented. Moves all extremities  Extremities: large ulcer over right malleolus  Psychiatry: judgement and insight appears normal. Flat mood and affect    Data Reviewed: I have personally reviewed following labs and imaging studies  CBC: Recent Labs  Lab 08/28/20 2236 08/29/20 0450 08/30/20 0440 08/31/20 0440 09/01/20 0620 09/02/20 0432  WBC 9.5 10.5 8.3 8.4 8.4 5.5  NEUTROABS 5.6  --   --   --   --   --   HGB 10.7* 9.8* 10.3* 10.1* 10.8* 11.4*  HCT 35.0* 31.0* 33.8* 32.4* 34.5* 36.1  MCV 77.6* 78.1* 77.5* 77.1*  75.5* 75.2*  PLT 499* 387 403* 438* 494* 537*   Basic Metabolic Panel: Recent Labs  Lab 08/29/20 0450 08/30/20 0440 08/31/20 0440 09/01/20 0620 09/02/20 0432  NA 140 135 137 138 139  K 4.0 4.0 4.1 3.9 4.0  CL 110 103 105 104 106  CO2 25 28 28 27 25   GLUCOSE 93 88 99 91 160*  BUN 15 13 20 14 17   CREATININE 0.72 0.50 0.52 0.48 0.45  CALCIUM 8.2* 8.3* 8.3* 8.3* 9.0   GFR: Estimated Creatinine Clearance: 68.8 mL/min (by C-G formula based on SCr of 0.45 mg/dL). Liver Function Tests: Recent Labs  Lab 08/28/20 2236  AST 16  ALT 11  ALKPHOS 85  BILITOT 0.3  PROT 7.3  ALBUMIN 3.3*   No results for input(s): LIPASE, AMYLASE in the last 168 hours. No results for input(s): AMMONIA in the last 168 hours. Coagulation Profile: Recent Labs  Lab 08/28/20 2236  INR 1.1   Cardiac Enzymes: No results for input(s): CKTOTAL, CKMB, CKMBINDEX, TROPONINI in the last 168 hours. BNP (last 3 results) No results for input(s): PROBNP in the last  8760 hours. HbA1C: No results for input(s): HGBA1C in the last 72 hours.  CBG: No results for input(s): GLUCAP in the last 168 hours. Lipid Profile: No results for input(s): CHOL, HDL, LDLCALC, TRIG, CHOLHDL, LDLDIRECT in the last 72 hours. Thyroid Function Tests: No results for input(s): TSH, T4TOTAL, FREET4, T3FREE, THYROIDAB in the last 72 hours. Anemia Panel: No results for input(s): VITAMINB12, FOLATE, FERRITIN, TIBC, IRON, RETICCTPCT in the last 72 hours. Sepsis Labs: Recent Labs  Lab 08/28/20 2236 08/29/20 0450  LATICACIDVEN 1.4 1.0    Recent Results (from the past 240 hour(s))  Culture, blood (single) w Reflex to ID Panel     Status: None (Preliminary result)   Collection Time: 08/28/20 10:36 PM   Specimen: BLOOD  Result Value Ref Range Status   Specimen Description BLOOD RIGHT ANTECUBITAL  Final   Special Requests   Final    BOTTLES DRAWN AEROBIC AND ANAEROBIC Blood Culture adequate volume   Culture   Final    NO GROWTH 4 DAYS Performed at [facility], [address], [place], [address] [place]    Report Status PENDING  Incomplete  Resp Panel by RT-PCR (Flu A&B, Covid) Nasopharyngeal Swab     Status: None   Collection Time: 08/28/20 10:37 PM   Specimen: Nasopharyngeal Swab; Nasopharyngeal(NP) swabs in vial transport medium  Result Value Ref Range Status   SARS Coronavirus 2 by RT PCR NEGATIVE NEGATIVE Final    Comment: (NOTE) SARS-CoV-2 target nucleic acids are NOT DETECTED.  The SARS-CoV-2 RNA is generally detectable in upper respiratory specimens during the acute phase of infection. The lowest concentration of SARS-CoV-2 viral copies this assay can detect is 138 copies/mL. A negative result does not preclude SARS-Cov-2 infection and should not be used as the sole basis for treatment or other patient management decisions. A negative result may occur with  improper specimen collection/handling, submission of specimen other than nasopharyngeal swab,  presence of viral mutation(s) within the areas targeted by this assay, and inadequate number of viral copies(<138 copies/mL). A negative result must be combined with clinical observations, patient history, and epidemiological information. The expected result is Negative.  Fact Sheet for Patients:  08676  Fact Sheet for Healthcare Providers:  08/30/20  This test is no t yet approved or cleared by the [employer] FDA and  has been authorized for detection and/or diagnosis  of SARS-CoV-2 by FDA under an Emergency Use Authorization (EUA). This EUA will remain  in effect (meaning this test can be used) for the duration of the COVID-19 declaration under Section 564(b)(1) of the Act, 21 U.S.C.section 360bbb-3(b)(1), unless the authorization is terminated  or revoked sooner.       Influenza A by PCR NEGATIVE NEGATIVE Final   Influenza B by PCR NEGATIVE NEGATIVE Final    Comment: (NOTE) The Xpert Xpress SARS-CoV-2/FLU/RSV plus assay is intended as an aid in the diagnosis of influenza from Nasopharyngeal swab specimens and should not be used as a sole basis for treatment. Nasal washings and aspirates are unacceptable for Xpert Xpress SARS-CoV-2/FLU/RSV testing.  Fact Sheet for Patients: [employer]  Fact Sheet for Healthcare Providers: [employer]ious[employer]ker[employer]it  This test is not yet approved or cleared by the [place] FDA and has been authorized for detection and/or diagnosis of SARS-CoV-2 by FDA under an Emergency Use Authorization (EUA). This EUA will remain in effect (meaning this test can be used) for the duration of the COVID-19 declaration under Section 564(b)(1) of the Act, 21 U.S.C. section 360bbb-3(b)(1), unless the authorization is terminated or revoked.  Performed at [facility], [address] [place], [place] [address]   Blood culture  (routine single)     Status: None (Preliminary result)   Collection Time: 08/28/20 10:39 PM   Specimen: BLOOD  Result Value Ref Range Status   Specimen Description BLOOD LEFT ANTECUBITAL  Final   Special Requests   Final    BOTTLES DRAWN AEROBIC AND ANAEROBIC Blood Culture adequate volume   Culture   Final    NO GROWTH 4 DAYS Performed at [facility], [address] [place], [place] [address]    Report Status PENDING  Incomplete  MRSA PCR Screening     Status: None   Collection Time: 08/30/20  1:32 PM  Result Value Ref Range Status   MRSA by PCR NEGATIVE NEGATIVE Final    Comment:        The GeneXpert MRSA Assay (FDA approved for NASAL specimens only), is one component of a comprehensive MRSA colonization surveillance program. It is not intended to diagnose MRSA infection nor to guide or monitor treatment for MRSA infections. Performed at [facility], [address] [place], [place] [address]   Aerobic Culture w Gram Stain (superficial specimen)     Status: None (Preliminary result)   Collection Time: 09/01/20  2:40 PM   Specimen: Wound  Result Value Ref Range Status   Specimen Description   Final    WOUND Performed at [facility], [address] [place], [place] [address]    Special Requests   Final    NONE Performed at [facility], [address] [place], [place] [address]    Gram Stain   Final    RARE WBC PRESENT, PREDOMINANTLY PMN FEW GRAM NEGATIVE RODS FEW GRAM POSITIVE COCCI IN CLUSTERS Performed at [facility], [address], [place], [place] [address]    Culture PENDING  Incomplete   Report Status PENDING  Incomplete         Radiology Studies: PERIPHERAL VASCULAR CATHETERIZATION  Result Date: 09/01/2020 See surgical note for result.       Scheduled Meds:  amoxicillin-clavulanate  1 tablet Oral Q12H   vitamin C  250 mg Oral BID   cholecalciferol  1,000 Units Oral Daily   docusate sodium   200 mg Oral BID   enoxaparin (LOVENOX) injection  40 mg Subcutaneous Q24H   ferrous gluconate  324 mg Oral BID WC   gabapentin  300 mg Oral TID   nicotine  21 mg Transdermal Daily   nutrition supplement (JUVEN)  1 packet Oral BID BM   polyethylene glycol  17 g Oral Daily   Ensure Max Protein  11 oz Oral BID   sodium chloride flush  3 mL Intravenous Q12H   Continuous Infusions:  sodium chloride Stopped (08/29/20 1709)   methylPREDNISolone (SOLU-MEDROL) injection Stopped (09/02/20 0600)     LOS: 5 days    Time spent: 33 mins    Charise Killian, MD [facility] Pager [phone]  If 7PM-7AM, please contact night-coverage 09/02/2020, 7:17 AM

## 2020-09-02 NOTE — Plan of Care (Addendum)
End of Shift Summary:  SoftBPs. Tearful at start of shift. Multiple episodes of diarrhea, 9-10 per pt, notified Mansy - no stool specimen ordered, started on immodium PRN. Denies n/v. Pain managed with prn Percocet x1, zofran given prophylactically. Wound to right ankle dressed with xeroform, gauze and wrapped. Up to bathroom independently, urine output adequate. Remained free from falls or injury, bed low and in locked position. Call bell within reach and able to use.  Problem: Education: Goal: Knowledge of General Education information will improve Description: Including pain rating scale, medication(s)/side effects and non-pharmacologic comfort measures Outcome: Progressing   Problem: Health Behavior/Discharge Planning: Goal: Ability to manage health-related needs will improve Outcome: Progressing   Problem: Clinical Measurements: Goal: Ability to maintain clinical measurements within normal limits will improve Outcome: Progressing Goal: Will remain free from infection Outcome: Progressing   Problem: Coping: Goal: Level of anxiety will decrease Outcome: Progressing   Problem: Pain Managment: Goal: General experience of comfort will improve Outcome: Progressing   Problem: Skin Integrity: Goal: Risk for impaired skin integrity will decrease Outcome: Progressing   Problem: Clinical Measurements: Goal: Ability to maintain clinical measurements within normal limits will improve Outcome: Progressing Goal: Will remain free from infection Outcome: Progressing Goal: Diagnostic test results will improve Outcome: Progressing Goal: Respiratory complications will improve Outcome: Progressing Goal: Cardiovascular complication will be avoided Outcome: Progressing   Problem: Coping: Goal: Level of anxiety will decrease Outcome: Progressing   Problem: Pain Managment: Goal: General experience of comfort will improve Outcome: Progressing   Problem: Safety: Goal: Ability to remain  free from injury will improve Outcome: Progressing

## 2020-09-03 DIAGNOSIS — D509 Iron deficiency anemia, unspecified: Secondary | ICD-10-CM

## 2020-09-03 DIAGNOSIS — D75839 Thrombocytosis, unspecified: Secondary | ICD-10-CM

## 2020-09-03 DIAGNOSIS — T148XXA Other injury of unspecified body region, initial encounter: Secondary | ICD-10-CM

## 2020-09-03 DIAGNOSIS — L089 Local infection of the skin and subcutaneous tissue, unspecified: Secondary | ICD-10-CM

## 2020-09-03 LAB — CYCLIC CITRUL PEPTIDE ANTIBODY, IGG/IGA: CCP Antibodies IgG/IgA: 4 units (ref 0–19)

## 2020-09-03 LAB — CBC
HCT: 35.1 % — ABNORMAL LOW (ref 36.0–46.0)
Hemoglobin: 10.9 g/dL — ABNORMAL LOW (ref 12.0–15.0)
MCH: 23.7 pg — ABNORMAL LOW (ref 26.0–34.0)
MCHC: 31.1 g/dL (ref 30.0–36.0)
MCV: 76.3 fL — ABNORMAL LOW (ref 80.0–100.0)
Platelets: 542 10*3/uL — ABNORMAL HIGH (ref 150–400)
RBC: 4.6 MIL/uL (ref 3.87–5.11)
RDW: 18.3 % — ABNORMAL HIGH (ref 11.5–15.5)
WBC: 13.5 10*3/uL — ABNORMAL HIGH (ref 4.0–10.5)
nRBC: 0 % (ref 0.0–0.2)

## 2020-09-03 LAB — BASIC METABOLIC PANEL
Anion gap: 9 (ref 5–15)
BUN: 18 mg/dL (ref 6–20)
CO2: 24 mmol/L (ref 22–32)
Calcium: 8.4 mg/dL — ABNORMAL LOW (ref 8.9–10.3)
Chloride: 107 mmol/L (ref 98–111)
Creatinine, Ser: 0.41 mg/dL — ABNORMAL LOW (ref 0.44–1.00)
GFR, Estimated: >60 mL/min (ref >60)
Glucose, Bld: 137 mg/dL — ABNORMAL HIGH (ref 70–99)
Potassium: 3.9 mmol/L (ref 3.5–5.1)
Sodium: 140 mmol/L (ref 135–145)

## 2020-09-03 LAB — HEPATITIS B SURFACE ANTIBODY, QUANTITATIVE: Hep B S AB Quant (Post): <3.1 m[IU]/mL — ABNORMAL LOW (ref >9.9)

## 2020-09-03 MED ORDER — SULFAMETHOXAZOLE-TRIMETHOPRIM 800-160 MG PO TABS
1.0000 | ORAL_TABLET | Freq: Two times a day (BID) | ORAL | Status: DC
  Administered 2020-09-03 – 2020-09-11 (×18): 1 via ORAL
  Filled 2020-09-03 (×21): qty 1

## 2020-09-03 NOTE — Consult Note (Signed)
Follow-up vasculitis Day 2 of IV steroids No side effect CT shows UIP. No joint pain.  No new skin lesions  Exam: Unchanged.  No new skin lesions in the lower extremities  Impression: ANCA positive vasculitis.  Positive skin biopsy.  Inflammatory arthritis.  Component of neuropathy.  Interstitial lung disease  Recommendations: Continue third day of IV Solu-Medrol high-dose.    Recommend hematology consult regarding initiation of rituximab.  Wegener's dosing.  possibly get first dose in the hospital .  After the above may discharge on 60 mg of prednisone with follow-up to me in 2 to 3 weeks

## 2020-09-03 NOTE — Progress Notes (Signed)
PROGRESS NOTE    Alexandra [name]  BMW:[id number] DOB: May 07, 1970 DOA: 08/28/2020 PCP: [facility] (Confirm with patient/family/NH records and if not entered, this HAS to be entered at [facility] point of entry. "No PCP" if truly none.)   Chief Complaint  Patient presents with   Wound Check    Brief Narrative:  HPI was taken from Dr. Joylene Igo: Alexandra [name] is a 50 y.o. 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 Patient states that she has had this ulcer for 3 months, it is nonhealing and appears to be extending to the anterior surface of her right leg.  She rates her pain a 10 x 10 in intensity at its worst and pain is usually at rest.  When she is ambulating or standing on her feet she does not feel any pain. She has increased purulent drainage from the wound but denies having any fever or chills.  She has felt unwell and complains of nausea without any emesis or abdominal pain. She was hospitalized 1 month ago and was discharged home on doxycycline which she completed without any significant improvement.  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  Patient was referred to rheumatology and has an appointment scheduled for July 7. She denies having any chest pain,  no headache, no cough, no abdominal pain, no changes in her bowel habits, no dizziness, no lightheadedness no urinary symptoms or changes in her bowel habits. Labs showed sodium 138, potassium 3.5, chloride 105, bicarb 25, glucose 88, BUN 15, creatinine 0.75, calcium 8.7, alkaline phosphatase 85, albumin 3.3, AST 16,  ALT 11, total protein 7.3, lactic acid 1.4, white count 9.5, hemoglobin 10.7, hematocrit 35.0, MCV 77.6, RDW 18.2, platelet count 499, PT 13.9, INR 1.1 Respiratory viral panel is negative Right ankle x-ray shows diffuse soft tissue swelling or more focal ulceration over the lateral malleolus and distal fibular diaphysis where some early periosteal reaction is suggested. May implicate reactive periostitis or early osteomyelitis. Chest x-ray reviewed by me shows coarse reticular and patchy opacities in the lung bases favor superimposed edematous change given vascular congestion, septal thickening and cuffing. Underlying infection is difficult to fully exclude. Low volumes with additional features of atelectasis. Twelve-lead EKG reviewed by me shows sinus rhythm.       ED Course: Patient is a 50 year old female who presents to the ER for evaluation of pain and increased purulent drainage from a chronic ulcer over her right ankle. Imaging shows early periosteal reaction which could be reactive periostitis versus early osteomyelitis. She will be admitted to the hospital for further evaluation.     Hospital course from Dr. Mayford Knife 6/17-6/21/22: Pt was found to have nonhealing right lateral malleolus ulcer & possible cellulitis. Pt has a positive RF and P-ANCA and there is a concern for vasculitis. Rheumatology and ID are following the pt. Other serologies were sent as per rheum (see rheum's note). Pt was started on IV steroids x 3 days and CT chest ordered to further evaluate reticular nodular lesions in the lungs was ordered as per rheum.Of note, pt is now on po augmentin as per ID and podiatry no longer recommends f/u w/ them  outpatient for possible debridement but to f/u w/ a wound care center and consider hyperbaric oxygen to help w/ wound healing.     Assessment & Plan:   Principal Problem:   Osteomyelitis of ankle (HCC) Active Problems:   Tobacco use disorder   Depression   Iron  deficiency anemia   Thrombocytosis   #1 nonhealing right lateral malleolus ulcer/cellulitis-probable vasculitis -Patient noted to P ANCA positive.  Chest x-ray, CT chest with interstitial lung disease. -Concern for possible Buerger's disease with history of smoking. -Right lower extremity ultrasound negative for DVT. -Patient was seen in consultation by podiatry who no longer recommends follow-up with them in the outpatient setting but to follow-up with the wound care center and consider hyperbaric oxygen to help with wound healing. -Patient seen by vascular surgery, underwent angiogram without intervention with normal common femoral artery, profunda femoris artery was small but normal, superficial femoral artery and popliteal arteries normal.  Typical tibial trifurcation with three-vessel runoff although all 3 vessels were small with poor flow into the foot and small vessel disease in the foot and ankle.  -Patient being followed by ID and patient currently on Augmentin. 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 -Supportive care -Follow.  2.  Depression -Stable. -It is noted that patient stopped taking her home dose of bupropion, Cymbalta, ariprazole. -Continue Celexa. -Outpatient follow-up.  3.  Nicotine dependence -Nicotine patch.  4.  Iron deficiency anemia -H&H stable. -Continue oral iron supplementation.  5.  Thrombocytosis -Likely reactive.   DVT prophylaxis: Lovenox Code Status: Full Family Communication: Updated patient.  No family at bedside. Disposition:   Status is: Inpatient  Remains inpatient appropriate because:IV treatments appropriate due to intensity of illness or inability to take PO  Dispo: The  patient is from: Home              Anticipated d/c is to: Home              Patient currently is not medically stable to d/c.   Difficult to place patient No       Consultants:  Vascular surgery: Dr. Wyn Quakerew 08/29/2020 Podiatry: Dr. Allena KatzPatel 08/30/2020 Infectious disease: Dr. Avon Gullyavishanker 09/01/2020 Rheumatology: Dr. Gavin [facility]le 09/01/2020    Procedures:  CT chest 09/02/2020 Chest x-ray 08/28/2020 Plain films of the right ankle 08/28/2020 Lower extremity Doppler 08/29/2020 Ultrasound guidance for vascular access left femoral artery/catheter placement into right SFA from left femoral approach/aortogram and selective right lower extremity angiogram/StarClose closure device left femoral artery per Dr. Wyn Quakerew 09/01/2020   Antimicrobials:  Augmentin 09/01/2020>>>>> 09/03/2020 Bactrim DS 09/03/2020>>>> IV Rocephin 08/29/2020>>>> 09/01/2020 IV Flagyl 08/29/2020>>>> 09/01/2020 IV vancomycin 08/29/2020>>>> 08/31/2020    Subjective: Tearful. No CP. No SOB.   Objective: Vitals:   09/03/20 0007 09/03/20 0501 09/03/20 0745 09/03/20 1208  BP: (!) 95/56 (!) 98/58 101/61 (!) 97/54  Pulse: 75 72 66 62  Resp: 14 17 18 16   Temp: 98 F (36.7 C) 98.3 F (36.8 C) 97.9 F (36.6 C) 98.1 F (36.7 C)  TempSrc: Oral     SpO2: 99% 96% 97% 94%  Weight:      Height:        Intake/Output Summary (Last 24 hours) at 09/03/2020 1857 Last data filed at 09/03/2020 1807 Gross per 24 hour  Intake 205.97 ml  Output --  Net 205.97 ml   Filed Weights   08/28/20 1927 09/01/20 0937  Weight: 58.5 kg 58.5 kg    Examination:  General exam: Appears calm and comfortable  Respiratory system: Clear to auscultation. Respiratory effort normal. Cardiovascular system: S1 & S2 heard, RRR. No JVD, murmurs, rubs, gallops or clicks. No pedal edema. Gastrointestinal system: Abdomen is nondistended, soft and nontender. No organomegaly or masses felt. Normal bowel sounds heard. Central nervous system: Alert and oriented. No focal  neurological deficits. Extremities: Symmetric 5 x 5 power. Skin: Large ulcer noted over right malleolus.  Psychiatry: Judgement and insight appear normal. Mood & affect appropriate.     Data Reviewed: I have personally reviewed following labs and imaging studies  CBC: Recent Labs  Lab 08/28/20 2236 08/29/20 0450 08/30/20 0440 08/31/20 0440 09/01/20 0620 09/02/20 0432 09/03/20 0533  WBC 9.5   < > 8.3 8.4 8.4 5.5 13.5*  NEUTROABS 5.6  --   --   --   --   --   --   HGB 10.7*   < > 10.3* 10.1* 10.8* 11.4* 10.9*  HCT 35.0*   < > 33.8* 32.4* 34.5* 36.1 35.1*  MCV 77.6*   < > 77.5* 77.1* 75.5* 75.2* 76.3*  PLT 499*   < > 403* 438* 494* 537* 542*   < > = values in this interval not displayed.    Basic Metabolic Panel: Recent Labs  Lab 08/30/20 0440 08/31/20 0440 09/01/20 0620 09/02/20 0432 09/03/20 0533  NA 135 137 138 139 140  K 4.0 4.1 3.9 4.0 3.9  CL 103 105 104 106 107  CO2 28 28 27 25 24   GLUCOSE 88 99 91 160* 137*  BUN 13 20 14 17 18   CREATININE 0.50 0.52 0.48 0.45 0.41*  CALCIUM 8.3* 8.3* 8.3* 9.0 8.4*    GFR: Estimated Creatinine Clearance: 68.8 mL/min (A) (by C-G formula based on SCr of 0.41 mg/dL (L)).  Liver Function Tests: Recent Labs  Lab 08/28/20 2236  AST 16  ALT 11  ALKPHOS 85  BILITOT 0.3  PROT 7.3  ALBUMIN 3.3*    CBG: No results for input(s): GLUCAP in the last 168 hours.   Recent Results (from the past 240 hour(s))  Culture, blood (single) w Reflex to ID Panel     Status: None   Collection Time: 08/28/20 10:36 PM   Specimen: BLOOD  Result Value Ref Range Status   Specimen Description BLOOD RIGHT ANTECUBITAL  Final   Special Requests   Final    BOTTLES DRAWN AEROBIC AND ANAEROBIC Blood Culture adequate volume   Culture   Final    NO GROWTH 5 DAYS Performed at [facility], [address] [place], [address] [place]    Report Status 09/02/2020 FINAL  Final  Resp Panel by RT-PCR (Flu A&B, Covid) Nasopharyngeal Swab      Status: None   Collection Time: 08/28/20 10:37 PM   Specimen: Nasopharyngeal Swab; Nasopharyngeal(NP) swabs in vial transport medium  Result Value Ref Range Status   SARS Coronavirus 2 by RT PCR NEGATIVE NEGATIVE Final    Comment: (NOTE) SARS-CoV-2 target nucleic acids are NOT DETECTED.  The SARS-CoV-2 RNA is generally detectable in upper respiratory specimens during the acute phase of infection. The lowest concentration of SARS-CoV-2 viral copies this assay can detect is 138 copies/mL. A negative result does not preclude SARS-Cov-2 infection and should not be used as the sole basis for treatment or other patient management decisions. A negative result may occur  with  improper specimen collection/handling, submission of specimen other than nasopharyngeal swab, presence of viral mutation(s) within the areas targeted by this assay, and inadequate number of viral copies(<138 copies/mL). A negative result must be combined with clinical observations, patient history, and epidemiological information. The expected result is Negative.  Fact Sheet for Patients:  BloggerCourse.com  Fact Sheet for Healthcare Providers:  SeriousBroker.it  This test is no t yet approved or cleared by the [place] FDA and  has been authorized for detection and/or diagnosis of SARS-CoV-2 by FDA under an Emergency Use Authorization (EUA). This EUA will remain  in effect (meaning this test can be used) for the duration of the COVID-19 declaration under Section 564(b)(1) of the Act, 21 U.S.C.section 360bbb-3(b)(1), unless the authorization is terminated  or revoked sooner.       Influenza A by PCR NEGATIVE NEGATIVE Final   Influenza B by PCR NEGATIVE NEGATIVE Final    Comment: (NOTE) The Xpert Xpress SARS-CoV-2/FLU/RSV plus assay is intended as an aid in the diagnosis of influenza from Nasopharyngeal swab specimens and should not be used as a sole basis  for treatment. Nasal washings and aspirates are unacceptable for Xpert Xpress SARS-CoV-2/FLU/RSV testing.  Fact Sheet for Patients: BloggerCourse.com  Fact Sheet for Healthcare Providers: SeriousBroker.it  This test is not yet approved or cleared by the [place] FDA and has been authorized for detection and/or diagnosis of SARS-CoV-2 by FDA under an Emergency Use Authorization (EUA). This EUA will remain in effect (meaning this test can be used) for the duration of the COVID-19 declaration under Section 564(b)(1) of the Act, 21 U.S.C. section 360bbb-3(b)(1), unless the authorization is terminated or revoked.  Performed at [facility], [address] [place], [place] [address]   Blood culture (routine single)     Status: None   Collection Time: 08/28/20 10:39 PM   Specimen: BLOOD  Result Value Ref Range Status   Specimen Description BLOOD LEFT ANTECUBITAL  Final   Special Requests   Final    BOTTLES DRAWN AEROBIC AND ANAEROBIC Blood Culture adequate volume   Culture   Final    NO GROWTH 5 DAYS Performed at [facility], [address] [place], [place] [address]    Report Status 09/02/2020 FINAL  Final  MRSA PCR Screening     Status: None   Collection Time: 08/30/20  1:32 PM  Result Value Ref Range Status   MRSA by PCR NEGATIVE NEGATIVE Final    Comment:        The GeneXpert MRSA Assay (FDA approved for NASAL specimens only), is one component of a comprehensive MRSA colonization surveillance program. It is not intended to diagnose MRSA infection nor to guide or monitor treatment for MRSA infections. Performed at [facility], [address], [place], [place] [address]   Urine culture     Status: None   Collection Time: 09/01/20  9:15 AM   Specimen: In/Out Cath Urine  Result Value Ref Range Status   Specimen Description   Final    IN/OUT CATH URINE Performed at [facility], [address], [place], [place] [address]    Special Requests   Final    NONE Performed at [facility], [address] [place], [place] [address]    Culture   Final    NO GROWTH Performed at [facility], [address], [place], [place] [address]    Report Status 09/02/2020 FINAL  Final  Aerobic Culture  w Gram Stain (superficial specimen)     Status: None (Preliminary result)   Collection Time: 09/01/20  2:40 PM   Specimen: Wound  Result Value Ref Range Status   Specimen Description   Final    WOUND Performed at [facility], [address], [place], [place] [address]    Special Requests   Final    NONE Performed at [facility], [address] [place], [place] [address]    Gram Stain   Final    RARE WBC PRESENT, PREDOMINANTLY PMN FEW GRAM NEGATIVE RODS FEW GRAM POSITIVE COCCI IN CLUSTERS    Culture   Final    ABUNDANT STAPHYLOCOCCUS AUREUS MODERATE STENOTROPHOMONAS MALTOPHILIA SUSCEPTIBILITIES TO FOLLOW Performed at [facility], [address] [place], [place] [address]    Report Status PENDING  Incomplete         Radiology Studies: CT CHEST W CONTRAST  Result Date: 09/02/2020 CLINICAL DATA:  Pulmonary nodule. EXAM: CT CHEST WITH CONTRAST TECHNIQUE: Multidetector CT imaging of the chest was performed during intravenous contrast administration. CONTRAST:  27mL OMNIPAQUE IOHEXOL 300 MG/ML  SOLN COMPARISON:  06/05/2011 FINDINGS: Cardiovascular: The heart size is normal. No substantial pericardial effusion. No thoracic aortic aneurysm. Mediastinum/Nodes: 14 mm short axis right paratracheal node is similar to prior suggesting benign/reactive etiology. Small prevascular lymph nodes are also stable. Small lymph nodes in the hilar regions and subcarinal station are similar to prior. The esophagus has normal imaging features. There is no axillary lymphadenopathy. Lungs/Pleura: Subpleural reticulation noted in both lungs with a  basilar predominance. Small patchy areas of ground-glass opacity are seen in both lungs. No dense focal airspace consolidation. No pleural effusion. Upper Abdomen: Unremarkable. Musculoskeletal: No worrisome lytic or sclerotic osseous abnormality. IMPRESSION: 1. Subpleural reticulation in both lungs with a basilar predominance. Small patchy areas of ground-glass opacity are seen in both lungs. Imaging features suggest underlying component of pulmonary fibrosis in UIP would be a consideration. Follow-up high-resolution chest CT recommended for more definitive evaluation. 2. Stable mild mediastinal and hilar lymphadenopathy, likely reactive. 3. Aortic Atherosclerosis (ICD10-I70.0). Electronically Signed   By: Kennith Center M.D.   On: 09/02/2020 15:09        Scheduled Meds:  vitamin C  250 mg Oral BID   cholecalciferol  1,000 Units Oral Daily   citalopram  20 mg Oral Daily   docusate sodium  200 mg Oral BID   enoxaparin (LOVENOX) injection  40 mg Subcutaneous Q24H   ferrous gluconate  324 mg Oral BID WC   gabapentin  300 mg Oral TID   nicotine  21 mg Transdermal Daily   nutrition supplement (JUVEN)  1 packet Oral BID BM   pantoprazole  40 mg Oral Daily   polyethylene glycol  17 g Oral Daily   Ensure Max Protein  11 oz Oral BID   sodium chloride flush  3 mL Intravenous Q12H   sulfamethoxazole-trimethoprim  1 tablet Oral Q12H   Continuous Infusions:  sodium chloride Stopped (08/29/20 1709)   methylPREDNISolone (SOLU-MEDROL) injection 500 mg (09/03/20 1710)     LOS: 6 days    Time spent: 35 minutes    Ramiro Harvest, MD [facility]   To contact the attending provider between 7A-7P or the covering provider during after hours 7P-7A, please log into the web site [email or web address] and access using universal [place] password for that web site. If you do not have the password, please call the hospital operator.  09/03/2020, 6:57 PM

## 2020-09-03 NOTE — Progress Notes (Addendum)
Date of Admission:  08/28/2020        Subjective: Pt feeling a little better Pain rt leg not as sharp as before Diarrhea better but still incontinent of bowels when passing urine  Medications:   vitamin C  250 mg Oral BID   cholecalciferol  1,000 Units Oral Daily   citalopram  20 mg Oral Daily   docusate sodium  200 mg Oral BID   enoxaparin (LOVENOX) injection  40 mg Subcutaneous Q24H   ferrous gluconate  324 mg Oral BID WC   gabapentin  300 mg Oral TID   nicotine  21 mg Transdermal Daily   nutrition supplement (JUVEN)  1 packet Oral BID BM   pantoprazole  40 mg Oral Daily   polyethylene glycol  17 g Oral Daily   Ensure Max Protein  11 oz Oral BID   sodium chloride flush  3 mL Intravenous Q12H   sulfamethoxazole-trimethoprim  1 tablet Oral Q12H    Objective: Vital signs in last 24 hours: Temp:  [97.9 F (36.6 C)-98.4 F (36.9 C)] 98.4 F (36.9 C) (06/22 1929) Pulse Rate:  [62-75] 65 (06/22 1929) Resp:  [14-18] 18 (06/22 1929) BP: (95-102)/(54-61) 102/57 (06/22 1929) SpO2:  [94 %-99 %] 96 % (06/22 1929)  PHYSICAL EXAM:  General: Alert, cooperative, no distress, appears stated age.  Head: Normocephalic, without obvious abnormality, atraumatic. Eyes: Conjunctivae clear, anicteric sclerae. Pupils are equal ENT Nares normal. No drainage or sinus tenderness. Lips, mucosa, and tongue normal. No Thrush Neck: Supple, symmetrical, no adenopathy, thyroid: non tender no carotid bruit and no JVD. Back: No CVA tenderness. Lungs: b/l air entry- coarse crepts bases Heart: Regular rate and rhythm, no murmur, rub or gallop. Abdomen: Soft, non-tender,not distended. Bowel sounds normal. No masses Extremities: rt leg- near ankle on  lateral aspect- superficial wound covered with eschar- painful Skin: No rashes or lesions. Or bruising Lymph: Cervical, supraclavicular normal. Neurologic: Grossly non-focal  Lab Results Recent Labs    09/02/20 0432 09/03/20 0533  WBC 5.5 13.5*   HGB 11.4* 10.9*  HCT 36.1 35.1*  NA 139 140  K 4.0 3.9  CL 106 107  CO2 25 24  BUN 17 18  CREATININE 0.45 0.41*     Microbiology: WC - staph aureus stenotrophomonas Studies/Results: CT CHEST W CONTRAST  Result Date: 09/02/2020 CLINICAL DATA:  Pulmonary nodule. EXAM: CT CHEST WITH CONTRAST TECHNIQUE: Multidetector CT imaging of the chest was performed during intravenous contrast administration. CONTRAST:  27mL OMNIPAQUE IOHEXOL 300 MG/ML  SOLN COMPARISON:  06/05/2011 FINDINGS: Cardiovascular: The heart size is normal. No substantial pericardial effusion. No thoracic aortic aneurysm. Mediastinum/Nodes: 14 mm short axis right paratracheal node is similar to prior suggesting benign/reactive etiology. Small prevascular lymph nodes are also stable. Small lymph nodes in the hilar regions and subcarinal station are similar to prior. The esophagus has normal imaging features. There is no axillary lymphadenopathy. Lungs/Pleura: Subpleural reticulation noted in both lungs with a basilar predominance. Small patchy areas of ground-glass opacity are seen in both lungs. No dense focal airspace consolidation. No pleural effusion. Upper Abdomen: Unremarkable. Musculoskeletal: No worrisome lytic or sclerotic osseous abnormality. IMPRESSION: 1. Subpleural reticulation in both lungs with a basilar predominance. Small patchy areas of ground-glass opacity are seen in both lungs. Imaging features suggest underlying component of pulmonary fibrosis in UIP would be a consideration. Follow-up high-resolution chest CT recommended for more definitive evaluation. 2. Stable mild mediastinal and hilar lymphadenopathy, likely reactive. 3. Aortic Atherosclerosis (ICD10-I70.0). Electronically Signed  By: Kennith Center M.D.   On: 09/02/2020 15:09     Assessment/Plan:   Rt leg wound secondary to vasculitis- colonized with staph and steno- will change augmentin to bactrim for 7 days Vasculitis - P anca positive Seen by  rheumatologist- Day 3 of high dose steroids Plan for rituximab Would recommend giving covid vaccine before that. Will also check hepatitis B surface antigen and cab   Anemia  Diarrhea- black stools could be from iron, also got duloclax

## 2020-09-03 NOTE — Plan of Care (Signed)
End of Shift Summary:   Soft BPs. Multiple episodes of diarrhea, 9 per pt, refuses imodium. Denies n/v. Pain managed with prn Percocet x1, zofran given prophylactically. Dressing to right ankle remained intact. Up to bathroom independently, urine output adequate. Remained free from falls or injury, bed low and in locked position. Call bell within reach and able to use.  Problem: Education: Goal: Knowledge of General Education information will improve Description: Including pain rating scale, medication(s)/side effects and non-pharmacologic comfort measures 09/03/2020 0639 by Tawanna Cooler, RN Outcome: Progressing 09/02/2020 2012 by Tawanna Cooler, RN Outcome: Progressing   Problem: Activity: Goal: Risk for activity intolerance will decrease 09/03/2020 0639 by Tawanna Cooler, RN Outcome: Progressing 09/02/2020 2012 by Tawanna Cooler, RN Outcome: Progressing   Problem: Nutrition: Goal: Adequate nutrition will be maintained 09/03/2020 0639 by Tawanna Cooler, RN Outcome: Progressing 09/02/2020 2012 by Tawanna Cooler, RN Outcome: Progressing   Problem: Coping: Goal: Level of anxiety will decrease 09/03/2020 0639 by Tawanna Cooler, RN Outcome: Progressing 09/02/2020 2012 by Tawanna Cooler, RN Outcome: Progressing   Problem: Pain Managment: Goal: General experience of comfort will improve 09/03/2020 0639 by Tawanna Cooler, RN Outcome: Progressing 09/02/2020 2012 by Tawanna Cooler, RN Outcome: Progressing   Problem: Safety: Goal: Ability to remain free from injury will improve 09/03/2020 0639 by Tawanna Cooler, RN Outcome: Progressing 09/02/2020 2012 by Tawanna Cooler, RN Outcome: Progressing   Problem: Skin Integrity: Goal: Risk for impaired skin integrity will decrease 09/03/2020 0639 by Tawanna Cooler, RN Outcome: Progressing 09/02/2020 2012 by Tawanna Cooler, RN Outcome: Progressing   Problem: Education: Goal: Knowledge of  General Education information will improve Description: Including pain rating scale, medication(s)/side effects and non-pharmacologic comfort measures 09/03/2020 0639 by Tawanna Cooler, RN Outcome: Progressing 09/02/2020 2012 by Tawanna Cooler, RN Outcome: Progressing   Problem: Clinical Measurements: Goal: Ability to maintain clinical measurements within normal limits will improve 09/03/2020 0639 by Tawanna Cooler, RN Outcome: Progressing 09/02/2020 2012 by Tawanna Cooler, RN Outcome: Progressing Goal: Will remain free from infection 09/03/2020 0639 by Tawanna Cooler, RN Outcome: Progressing 09/02/2020 2012 by Tawanna Cooler, RN Outcome: Progressing Goal: Diagnostic test results will improve 09/03/2020 0639 by Tawanna Cooler, RN Outcome: Progressing 09/02/2020 2012 by Tawanna Cooler, RN Outcome: Progressing Goal: Respiratory complications will improve 09/03/2020 0639 by Tawanna Cooler, RN Outcome: Progressing 09/02/2020 2012 by Tawanna Cooler, RN Outcome: Progressing Goal: Cardiovascular complication will be avoided 09/03/2020 0639 by Tawanna Cooler, RN Outcome: Progressing 09/02/2020 2012 by Tawanna Cooler, RN Outcome: Progressing

## 2020-09-04 ENCOUNTER — Other Ambulatory Visit: Payer: Self-pay | Admitting: Oncology

## 2020-09-04 DIAGNOSIS — M313 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: Secondary | ICD-10-CM | POA: Insufficient documentation

## 2020-09-04 DIAGNOSIS — M86471 Chronic osteomyelitis with draining sinus, right ankle and foot: Secondary | ICD-10-CM

## 2020-09-04 DIAGNOSIS — I776 Arteritis, unspecified: Secondary | ICD-10-CM | POA: Insufficient documentation

## 2020-09-04 DIAGNOSIS — D509 Iron deficiency anemia, unspecified: Secondary | ICD-10-CM

## 2020-09-04 DIAGNOSIS — I7782 Antineutrophilic cytoplasmic antibody (ANCA) vasculitis: Secondary | ICD-10-CM | POA: Insufficient documentation

## 2020-09-04 LAB — IMMUNOFIXATION ELECTROPHORESIS
IgA: 426 mg/dL — ABNORMAL HIGH (ref 87–352)
IgG (Immunoglobin G), Serum: 1008 mg/dL (ref 586–1602)
IgM (Immunoglobulin M), Srm: 171 mg/dL (ref 26–217)
Total Protein ELP: 5.5 g/dL — ABNORMAL LOW (ref 6.0–8.5)

## 2020-09-04 LAB — BASIC METABOLIC PANEL
Anion gap: 7 (ref 5–15)
BUN: 21 mg/dL — ABNORMAL HIGH (ref 6–20)
CO2: 24 mmol/L (ref 22–32)
Calcium: 8.3 mg/dL — ABNORMAL LOW (ref 8.9–10.3)
Chloride: 107 mmol/L (ref 98–111)
Creatinine, Ser: 0.46 mg/dL (ref 0.44–1.00)
GFR, Estimated: >60 mL/min (ref >60)
Glucose, Bld: 125 mg/dL — ABNORMAL HIGH (ref 70–99)
Potassium: 4 mmol/L (ref 3.5–5.1)
Sodium: 138 mmol/L (ref 135–145)

## 2020-09-04 LAB — CBC WITH DIFFERENTIAL/PLATELET
Abs Immature Granulocytes: 0.11 10*3/uL — ABNORMAL HIGH (ref 0.00–0.07)
Basophils Absolute: 0 10*3/uL (ref 0.0–0.1)
Basophils Relative: 0 %
Eosinophils Absolute: 0 10*3/uL (ref 0.0–0.5)
Eosinophils Relative: 0 %
HCT: 32.3 % — ABNORMAL LOW (ref 36.0–46.0)
Hemoglobin: 10.2 g/dL — ABNORMAL LOW (ref 12.0–15.0)
Immature Granulocytes: 1 %
Lymphocytes Relative: 9 %
Lymphs Abs: 1.3 10*3/uL (ref 0.7–4.0)
MCH: 24.2 pg — ABNORMAL LOW (ref 26.0–34.0)
MCHC: 31.6 g/dL (ref 30.0–36.0)
MCV: 76.5 fL — ABNORMAL LOW (ref 80.0–100.0)
Monocytes Absolute: 0.3 10*3/uL (ref 0.1–1.0)
Monocytes Relative: 2 %
Neutro Abs: 12.5 10*3/uL — ABNORMAL HIGH (ref 1.7–7.7)
Neutrophils Relative %: 88 %
Platelets: 470 10*3/uL — ABNORMAL HIGH (ref 150–400)
RBC: 4.22 MIL/uL (ref 3.87–5.11)
RDW: 18.4 % — ABNORMAL HIGH (ref 11.5–15.5)
WBC: 14.2 10*3/uL — ABNORMAL HIGH (ref 4.0–10.5)
nRBC: 0 % (ref 0.0–0.2)

## 2020-09-04 LAB — AEROBIC CULTURE W GRAM STAIN (SUPERFICIAL SPECIMEN)

## 2020-09-04 LAB — C4 COMPLEMENT: Complement C4, Body Fluid: 9 mg/dL — ABNORMAL LOW (ref 12–38)

## 2020-09-04 LAB — C3 COMPLEMENT: C3 Complement: 114 mg/dL (ref 82–167)

## 2020-09-04 LAB — HEPATITIS B CORE ANTIBODY, TOTAL: Hep B Core Total Ab: NONREACTIVE

## 2020-09-04 LAB — MAGNESIUM: Magnesium: 2.3 mg/dL (ref 1.7–2.4)

## 2020-09-04 MED ORDER — BOOST / RESOURCE BREEZE PO LIQD CUSTOM
1.0000 | Freq: Three times a day (TID) | ORAL | Status: DC
  Administered 2020-09-05 – 2020-09-16 (×30): 1 via ORAL

## 2020-09-04 NOTE — Progress Notes (Signed)
   Date of Admission:  08/28/2020     ID: Alexandra [name] is a 50 y.o. female  Principal Problem:   Osteomyelitis of ankle (HCC) Active Problems:   Tobacco use disorder   Depression   Iron deficiency anemia   Thrombocytosis    Subjective: Feeling better Pain rt leg better  Medications:   vitamin C  250 mg Oral BID   cholecalciferol  1,000 Units Oral Daily   citalopram  20 mg Oral Daily   enoxaparin (LOVENOX) injection  40 mg Subcutaneous Q24H   ferrous gluconate  324 mg Oral BID WC   gabapentin  300 mg Oral TID   nicotine  21 mg Transdermal Daily   nutrition supplement (JUVEN)  1 packet Oral BID BM   pantoprazole  40 mg Oral Daily   Ensure Max Protein  11 oz Oral BID   sodium chloride flush  3 mL Intravenous Q12H   sulfamethoxazole-trimethoprim  1 tablet Oral Q12H    Objective: Vital signs in last 24 hours: Temp:  [98.2 F (36.8 C)-98.8 F (37.1 C)] 98.2 F (36.8 C) (06/23 1149) Pulse Rate:  [52-65] 54 (06/23 1149) Resp:  [16-18] 16 (06/23 1149) BP: (101-116)/(56-63) 115/63 (06/23 1149) SpO2:  [96 %-99 %] 97 % (06/23 1149)  PHYSICAL EXAM:  General: Alert, cooperative, no distress, appears stated age.  Head: Normocephalic, without obvious abnormality, atraumatic. Lungs:b/l air entry- crepts bases Heart: Regular rate and rhythm, no murmur, rub or gallop. Extremities: rt leg dressing not removed Skin: No rashes or lesions. Or bruising Lymph: Cervical, supraclavicular normal. Neurologic: Grossly non-focal  Lab Results Recent Labs    09/03/20 0533 09/04/20 0436  WBC 13.5* 14.2*  HGB 10.9* 10.2*  HCT 35.1* 32.3*  NA 140 138  K 3.9 4.0  CL 107 107  CO2 24 24  BUN 18 21*  CREATININE 0.41* 0.46     Assessment/Plan: Right leg more secondary to vasculitis. Colonized with staph and stenotrophomonas.  On Bactrim.  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  Seen by rheumatologist and received 3 days of high-dose methylprednisone.  Now  planning for rituximab tomorrow. Giving covid vaccine today will not help , also will not be effective- pts options are Evushield  after rituximab and vaccine later Not sure how many doses of rituximab  she will need- rheum or heme to decide on timing of  evushield which is a along acting mab against covid./ and vaccine    Anemia   Diarrhea with black stools could be from iron.   Discussed with care team ID will sign off- call if needed

## 2020-09-04 NOTE — Consult Note (Signed)
[facility]  [phone] [phone]  ID: Alexandra [name] OB: April 16, 1970  MR#: [id number]  CHE#:[id number]  Patient Care Team: [facility], [facility] as PCP - General (General Practice)  CHIEF COMPLAINT: p-ANCA vasculitis requiring Rituxan infusion.  INTERVAL HISTORY: Patient is a 50 year old female recently diagnosed with vasculitis who is currently on high-dose steroids.  Recommendation from rheumatology is to pursue IV Rituxan 1000 mg x 2 doses separated by 14 days.  Patient currently feels well.  She has no neurologic complaints.  She denies any recent fevers.  She has a fair appetite, but denies weight loss.  She has no chest pain, shortness of breath, cough, or hemoptysis.  She denies any nausea, vomiting, constipation, or diarrhea.  She has no urinary complaints.  Patient offers no further specific complaints today.  REVIEW OF SYSTEMS:   Review of Systems  Constitutional: Negative.  Negative for fever, malaise/fatigue and weight loss.  Respiratory: Negative.  Negative for cough, hemoptysis and shortness of breath.   Cardiovascular: Negative.  Negative for chest pain and leg swelling.  Gastrointestinal: Negative.  Negative for abdominal pain.  Genitourinary: Negative.  Negative for dysuria and hematuria.  Musculoskeletal: Negative.   Skin: Negative.  Negative for rash.  Neurological: Negative.  Negative for dizziness, focal weakness, weakness and headaches.  Psychiatric/Behavioral: Negative.  The patient is not nervous/anxious.    As per HPI. Otherwise, a complete review of systems is negative.  PAST MEDICAL HISTORY: Past Medical History:  Diagnosis Date   Breast discharge 2013   present X 3 years  Left only multiple ducts per pt   Depression     PAST SURGICAL HISTORY: Past Surgical History:  Procedure Laterality Date   ABDOMINAL HYSTERECTOMY     BREAST CYST EXCISION Left 2006   LOWER EXTREMITY ANGIOGRAPHY Right 09/01/2020    Procedure: Lower Extremity Angiography;  Surgeon: Annice Needy, MD;  Location: [facility] INVASIVE CV LAB;  Service: Cardiovascular;  Laterality: Right;    FAMILY HISTORY: Family History  Problem Relation Age of Onset   Breast cancer Mother 82   Breast cancer Maternal Aunt    Breast cancer Maternal Grandmother    Breast cancer Maternal Aunt        Maternal Great Aunt    Breast cancer Cousin 72    ADVANCED DIRECTIVES (Y/N):  @ADVDIR @  HEALTH MAINTENANCE: Social History   Tobacco Use   Smoking status: Every Day    Packs/day: 1.00    Years: 29.00    Pack years: 29.00    Types: Cigarettes   Smokeless tobacco: Never  Vaping Use   Vaping Use: Never used  Substance Use Topics   Alcohol use: Yes    Alcohol/week: 12.0 standard drinks    Types: 12 Cans of beer per week    Comment: social   Drug use: No     Colonoscopy:  PAP:  Bone density:  Lipid panel:  No Known Allergies  Current Facility-Administered Medications  Medication Dose Route Frequency Provider Last Rate Last Admin   0.9 %  sodium chloride infusion  250 mL Intravenous PRN , MD   Stopped at 08/29/20 1709   acetaminophen (TYLENOL) tablet 650 mg  650 mg Oral Q6H PRN 08/31/20, MD   650 mg at 08/30/20 2357   ascorbic acid (VITAMIN C) tablet 250 mg  250 mg Oral BID 09/01/20, MD   250 mg at 09/04/20 09/06/20   cholecalciferol (VITAMIN D) tablet 1,000 Units  1,000 Units Oral Daily Annice Needy, MD   1,000 Units at 09/04/20 0932   citalopram (CELEXA) tablet 20 mg  20 mg Oral Daily Charise Killian, MD   20 mg at 09/04/20 0933   enoxaparin (LOVENOX) injection 40 mg  40 mg Subcutaneous Q24H Annice Needy, MD   40 mg at 09/04/20 4132   feeding supplement (BOOST / RESOURCE BREEZE) liquid 1 Container  1 Container Oral TID BM Rodolph Bong, MD       ferrous gluconate (FERGON) tablet 324 mg  324 mg Oral BID WC Annice Needy, MD   324 mg at 09/04/20 1648   gabapentin (NEURONTIN) capsule 300 mg  300 mg Oral TID  Annice Needy, MD   300 mg at 09/04/20 1541   HYDROmorphone (DILAUDID) injection 0.5 mg  0.5 mg Intravenous Q4H PRN Annice Needy, MD   0.5 mg at 09/04/20 0931   loperamide (IMODIUM) capsule 2 mg  2 mg Oral BID PRN Charise Killian, MD       nicotine (NICODERM CQ - dosed in mg/24 hours) patch 21 mg  21 mg Transdermal Daily Annice Needy, MD   21 mg at 09/04/20 4401   nutrition supplement (JUVEN) (JUVEN) powder packet 1 packet  1 packet Oral BID BM Annice Needy, MD   1 packet at 09/04/20 1542   ondansetron (ZOFRAN) tablet 4 mg  4 mg Oral Q6H PRN Annice Needy, MD   4 mg at 08/30/20 1325   Or   ondansetron (ZOFRAN) injection 4 mg  4 mg Intravenous Q6H PRN Annice Needy, MD   4 mg at 09/04/20 1543   oxyCODONE-acetaminophen (PERCOCET) 7.5-325 MG per tablet 1 tablet  1 tablet Oral Q6H PRN Annice Needy, MD   1 tablet at 09/04/20 1541   pantoprazole (PROTONIX) EC tablet 40 mg  40 mg Oral Daily Charise Killian, MD   40 mg at 09/04/20 0936   sodium chloride flush (NS) 0.9 % injection 3 mL  3 mL Intravenous Q12H Annice Needy, MD   3 mL at 09/04/20 0939   sodium chloride flush (NS) 0.9 % injection 3 mL  3 mL Intravenous PRN Annice Needy, MD       sulfamethoxazole-trimethoprim (BACTRIM DS) 800-160 MG per tablet 1 tablet  1 tablet Oral Q12H Lynn Ito, MD   1 tablet at 09/04/20 0933    OBJECTIVE: Vitals:   09/04/20 1149 09/04/20 1537  BP: 115/63 (!) 105/54  Pulse: (!) 54 (!) 56  Resp: 16 18  Temp: 98.2 F (36.8 C) 98.1 F (36.7 C)  SpO2: 97% 96%     Body mass index is 22.85 kg/m.    ECOG FS:1 - Symptomatic but completely ambulatory  General: Well-developed, well-nourished, no acute distress. Eyes: Pink conjunctiva, anicteric sclera. HEENT: Normocephalic, moist mucous membranes. Lungs: No audible wheezing or coughing. Heart: Regular rate and rhythm. Abdomen: Soft, nontender, no obvious distention. Musculoskeletal: No edema, cyanosis, or clubbing. Neuro: Alert, answering all  questions appropriately. Cranial nerves grossly intact. Skin: No rashes or petechiae noted. Psych: Normal affect. Lymphatics: No cervical, calvicular, axillary or inguinal LAD.   LAB RESULTS:  Lab Results  Component Value Date   NA 138 09/04/2020   K 4.0 09/04/2020   CL 107 09/04/2020   CO2 24 09/04/2020   GLUCOSE 125 (H) 09/04/2020   BUN 21 (H) 09/04/2020   CREATININE 0.46 09/04/2020   CALCIUM 8.3 (L) 09/04/2020   PROT  7.3 08/28/2020   ALBUMIN 3.3 (L) 08/28/2020   AST 16 08/28/2020   ALT 11 08/28/2020   ALKPHOS 85 08/28/2020   BILITOT 0.3 08/28/2020   GFRNONAA >60 09/04/2020   GFRAA >60 03/25/2017    Lab Results  Component Value Date   WBC 14.2 (H) 09/04/2020   NEUTROABS 12.5 (H) 09/04/2020   HGB 10.2 (L) 09/04/2020   HCT 32.3 (L) 09/04/2020   MCV 76.5 (L) 09/04/2020   PLT 470 (H) 09/04/2020   Lab Results  Component Value Date   IRON 18 (L) 07/26/2020   TIBC 357 07/26/2020   IRONPCTSAT 5 (L) 07/26/2020   Lab Results  Component Value Date   FERRITIN 9 (L) 07/26/2020      STUDIES: DG Ankle Complete Right  Result Date: 08/28/2020 CLINICAL DATA:  Right ankle wound for 3 months, ulcerations, assess for osteomyelitis EXAM: RIGHT ANKLE - COMPLETE 3+ VIEW COMPARISON:  Radiograph 07/26/2020 FINDINGS: Persistent diffuse soft tissue swelling of the lower extremity with more focal skin thickening and ulcerative defect along the lateral malleolus and distal fibular diaphysis. There may be some early periosteal reaction which could reflect early features of periostitis or early developing osteomyelitis. Small ankle joint effusion. No acute bony abnormality. Specifically, no fracture, subluxation, or dislocation. Degenerative changes in the ankle and imaged portions of the foot. Posterior calcaneal spurring. IMPRESSION: Diffuse soft tissue swelling or more focal ulceration over the lateral malleolus and distal fibular diaphysis where some early periosteal reaction is suggested.  May implicate reactive periostitis or early osteomyelitis. Electronically Signed   By: Kreg ShropshirePrice  DeHay M.D.   On: 08/28/2020 23:09   CT CHEST W CONTRAST  Result Date: 09/02/2020 CLINICAL DATA:  Pulmonary nodule. EXAM: CT CHEST WITH CONTRAST TECHNIQUE: Multidetector CT imaging of the chest was performed during intravenous contrast administration. CONTRAST:  75mL OMNIPAQUE IOHEXOL 300 MG/ML  SOLN COMPARISON:  06/05/2011 FINDINGS: Cardiovascular: The heart size is normal. No substantial pericardial effusion. No thoracic aortic aneurysm. Mediastinum/Nodes: 14 mm short axis right paratracheal node is similar to prior suggesting benign/reactive etiology. Small prevascular lymph nodes are also stable. Small lymph nodes in the hilar regions and subcarinal station are similar to prior. The esophagus has normal imaging features. There is no axillary lymphadenopathy. Lungs/Pleura: Subpleural reticulation noted in both lungs with a basilar predominance. Small patchy areas of ground-glass opacity are seen in both lungs. No dense focal airspace consolidation. No pleural effusion. Upper Abdomen: Unremarkable. Musculoskeletal: No worrisome lytic or sclerotic osseous abnormality. IMPRESSION: 1. Subpleural reticulation in both lungs with a basilar predominance. Small patchy areas of ground-glass opacity are seen in both lungs. Imaging features suggest underlying component of pulmonary fibrosis in UIP would be a consideration. Follow-up high-resolution chest CT recommended for more definitive evaluation. 2. Stable mild mediastinal and hilar lymphadenopathy, likely reactive. 3. Aortic Atherosclerosis (ICD10-I70.0). Electronically Signed   By: Kennith CenterEric  Mansell M.D.   On: 09/02/2020 15:09   PERIPHERAL VASCULAR CATHETERIZATION  Result Date: 09/01/2020 See surgical note for result.  US Venous Img Lower Unilateral Right (DVT)  Result Date: 08/29/2020 CLINICAL DATA:  Right leg pain and swelling EXAM: RIGHT LOWER EXTREMITY VENOUS  DOPPLER ULTRASOUND TECHNIQUE: Gray-scale sonography with graded compression, as well as color Doppler and duplex ultrasound were performed to evaluate the lower extremity deep venous systems from the level of the common femoral vein and including the common femoral, femoral, profunda femoral, popliteal and calf veins including the posterior tibial, peroneal and gastrocnemius veins when visible. The superficial great saphenous vein was  also interrogated. Spectral Doppler was utilized to evaluate flow at rest and with distal augmentation maneuvers in the common femoral, femoral and popliteal veins. COMPARISON:  None. FINDINGS: Contralateral Common Femoral Vein: Respiratory phasicity is normal and symmetric with the symptomatic side. No evidence of thrombus. Normal compressibility. Common Femoral Vein: No evidence of thrombus. Normal compressibility, respiratory phasicity and response to augmentation. Saphenofemoral Junction: No evidence of thrombus. Normal compressibility and flow on color Doppler imaging. Profunda Femoral Vein: No evidence of thrombus. Normal compressibility and flow on color Doppler imaging. Femoral Vein: No evidence of thrombus. Normal compressibility, respiratory phasicity and response to augmentation. Popliteal Vein: No evidence of thrombus. Normal compressibility, respiratory phasicity and response to augmentation. Calf Veins: No evidence of thrombus. Normal compressibility and flow on color Doppler imaging. Other Findings: Right popliteal fossa Baker's cyst measures 3.2 x 1.0 x 1.4 cm IMPRESSION: Negative for right lower extremity DVT. 3.2 cm right Baker's cyst. Electronically Signed   By: Judie Petit.  Shick M.D.   On: 08/29/2020 14:46   DG Chest Port 1 View  Result Date: 08/28/2020 CLINICAL DATA:  Sepsis, lower extremity wounds EXAM: PORTABLE CHEST 1 VIEW COMPARISON:  12/20/2019 radiograph, 06/05/2011 CT FINDINGS: Low lung volumes. Patchy and reticular opacities are present in the mid to lower  lungs with associated pulmonary vascular congestion, fissural thickening and peribronchial cuffing. Chronic reticular changes are noted on comparison priors as well. No pneumothorax or visible effusion. Stable cardiomediastinal contours. No acute osseous or soft tissue abnormality. IMPRESSION: Coarse reticular and patchy opacities in the lung bases favor superimposed edematous change given vascular congestion, septal thickening and cuffing. Underlying infection is difficult to fully exclude. Low volumes with additional features of atelectasis. Electronically Signed   By: Kreg Shropshire M.D.   On: 08/28/2020 23:11    ASSESSMENT: p-ANCA vasculitis requiring Rituxan infusion.  PLAN:    p-ANCA vasculitis requiring Rituxan infusion: Patient currently receiving high-dose steroids.  She will remain inpatient, but be transferred to the cancer center tomorrow morning at 9 AM to receive 1000 mg IV Rituxan.  Will premedicate with Tylenol and Benadryl.  Recommendation from rheumatology is to repeat dosing in 2 weeks as an outpatient.  Will arrange appropriate follow-up on September 19, 2020 for her second dose.  Patient expressed understanding that any questions regarding her treatment for her underlying vasculitis should be directed to rheumatology. Iron deficiency anemia: Patient noted to have decreased hemoglobin and iron stores.  She may benefit from IV iron in the future. Leukocytosis: Likely from steroids, monitor. Thrombocytosis: Possibly related to underlying iron deficiency anemia, also reactive.  Monitor. COVID-vaccine: Agree with ID no benefit in giving vaccine prior to or immediately after Rituxan infusion.  Appreciate consult, will follow.   Jeralyn Ruths, MD   09/04/2020 7:19 PM

## 2020-09-04 NOTE — Progress Notes (Signed)
Nutrition Follow-up  DOCUMENTATION CODES:  Not applicable  INTERVENTION:  Continue current diet as ordered, encourage PO intake 1 packet Juven BID, each packet provides 95 calories, 2.5 grams of protein (collagen), and 9.8 grams of carbohydrate (3 grams sugar); also contains 7 grams of L-arginine and L-glutamine, 300 mg vitamin C, 15 mg vitamin E, 1.2 mcg vitamin B-12, 9.5 mg zinc, 200 mg calcium, and 1.5 g  Calcium Beta-hydroxy-Beta-methylbutyrate to support wound healing Vitamin C 500mg  daily Boost Breeze po TID, each supplement provides 250 kcal and 9 grams of protein  NUTRITION DIAGNOSIS:  Increased nutrient needs related to wound healing as evidenced by estimated needs.  GOAL:  Patient will meet greater than or equal to 90% of their needs  MONITOR:  PO intake, Supplement acceptance, Skin, Labs  REASON FOR ASSESSMENT:  Consult Wound healing  ASSESSMENT:  50 year old woman with ulcerative lesion to her right ankle worsening over the past 3 months. Tachycardic in triage, otherwise normal vitals.  X-ray demonstrates evidence of periosteal reaction and her medical picture is most consistent with osteomyelitis.  Vascular surgery consulted and determined that angiogram would be appropriate. Completed 6/20. Podiatry plans to see pt outpatient for possible debridement and graft application after her flow has been restored  Pt resting in bed at the time of assessment. States that appetite has not been very good this admission, having taste changes the last few days. States that intake was better PTA. Does endorse weight loss of ~10 lbs in the weeks prior to admission despite normal eating. 5.7% noted in the last month (5/14-6/20). Likely related to increased needs from infection.   Muscle and fat stores intact on exam.   Pt reports she has been drinking the Juven, but the ensure max upsets her stomach. Requests a change, will try boost breeze.   Pt does report she is having diarrhea.  Likely related to antibiotic administration. Hopeful to be able to go home by this weekend.  Average Meal Intake: 6/16-6/17: 95% intake x 1 recorded meal 6/18-6/23: 73% average x 5 recorded meals  Nutritionally Relevant Medications: Scheduled Meds:  vitamin C  250 mg Oral BID   cholecalciferol  1,000 Units Oral Daily   ferrous gluconate  324 mg Oral BID WC   nutrition supplement (JUVEN)  1 packet Oral BID BM   pantoprazole  40 mg Oral Daily   Ensure Max Protein  11 oz Oral BID   sodium chloride flush  3 mL Intravenous Q12H   sulfamethoxazole-trimethoprim  1 tablet Oral Q12H   PRN Meds: loperamide, ondansetron  Labs Reviewed  NUTRITION - FOCUSED PHYSICAL EXAM: Flowsheet Row Most Recent Value  Orbital Region No depletion  Upper Arm Region No depletion  Thoracic and Lumbar Region No depletion  Buccal Region No depletion  Temple Region Mild depletion  Clavicle Bone Region No depletion  Clavicle and Acromion Bone Region No depletion  Scapular Bone Region No depletion  Dorsal Hand No depletion  Patellar Region No depletion  Anterior Thigh Region No depletion  Posterior Calf Region No depletion  Edema (RD Assessment) None  Hair Reviewed  Eyes Reviewed  Mouth Reviewed  Skin Reviewed  Nails Reviewed   Diet Order:   Diet Order             Diet Heart Room service appropriate? Yes; Fluid consistency: Thin  Diet effective now                  EDUCATION NEEDS:  No education needs have been  identified at this time  Skin:  Skin Assessment: Skin Integrity Issues: Skin Integrity Issues:: Other (Comment) Other: open draining wound to the right, lateral ankle. 5x7 cm  Last BM:  6/23 - type 7  Height:  Ht Readings from Last 1 Encounters:  09/01/20 5\' 3"  (1.6 m)    Weight:  Wt Readings from Last 1 Encounters:  09/01/20 58.5 kg   Ideal Body Weight:  52.3 kg  BMI:  Body mass index is 22.85 kg/m.  Estimated Nutritional Needs:  Kcal:  1800-2000 kcal/d Protein:   90-100 g/d Fluid:  >2L/d   09/03/20, RD, LDN Clinical Dietitian Pager on Amion

## 2020-09-04 NOTE — Progress Notes (Signed)
PROGRESS NOTE    Alexandra [name]  BMW:[id number] DOB: May 07, 1970 DOA: 08/28/2020 PCP: [facility] (Confirm with patient/family/NH records and if not entered, this HAS to be entered at [facility] point of entry. "No PCP" if truly none.)   Chief Complaint  Patient presents with   Wound Check    Brief Narrative:  HPI was taken from Dr. Joylene Igo: Alexandra [name] is a 50 y.o. 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 Patient states that she has had this ulcer for 3 months, it is nonhealing and appears to be extending to the anterior surface of her right leg.  She rates her pain a 10 x 10 in intensity at its worst and pain is usually at rest.  When she is ambulating or standing on her feet she does not feel any pain. She has increased purulent drainage from the wound but denies having any fever or chills.  She has felt unwell and complains of nausea without any emesis or abdominal pain. She was hospitalized 1 month ago and was discharged home on doxycycline which she completed without any significant improvement.  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  Patient was referred to rheumatology and has an appointment scheduled for July 7. She denies having any chest pain,  no headache, no cough, no abdominal pain, no changes in her bowel habits, no dizziness, no lightheadedness no urinary symptoms or changes in her bowel habits. Labs showed sodium 138, potassium 3.5, chloride 105, bicarb 25, glucose 88, BUN 15, creatinine 0.75, calcium 8.7, alkaline phosphatase 85, albumin 3.3, AST 16,  ALT 11, total protein 7.3, lactic acid 1.4, white count 9.5, hemoglobin 10.7, hematocrit 35.0, MCV 77.6, RDW 18.2, platelet count 499, PT 13.9, INR 1.1 Respiratory viral panel is negative Right ankle x-ray shows diffuse soft tissue swelling or more focal ulceration over the lateral malleolus and distal fibular diaphysis where some early periosteal reaction is suggested. May implicate reactive periostitis or early osteomyelitis. Chest x-ray reviewed by me shows coarse reticular and patchy opacities in the lung bases favor superimposed edematous change given vascular congestion, septal thickening and cuffing. Underlying infection is difficult to fully exclude. Low volumes with additional features of atelectasis. Twelve-lead EKG reviewed by me shows sinus rhythm.       ED Course: Patient is a 50 year old female who presents to the ER for evaluation of pain and increased purulent drainage from a chronic ulcer over her right ankle. Imaging shows early periosteal reaction which could be reactive periostitis versus early osteomyelitis. She will be admitted to the hospital for further evaluation.     Hospital course from Dr. Mayford Knife 6/17-6/21/22: Pt was found to have nonhealing right lateral malleolus ulcer & possible cellulitis. Pt has a positive RF and P-ANCA and there is a concern for vasculitis. Rheumatology and ID are following the pt. Other serologies were sent as per rheum (see rheum's note). Pt was started on IV steroids x 3 days and CT chest ordered to further evaluate reticular nodular lesions in the lungs was ordered as per rheum.Of note, pt is now on po augmentin as per ID and podiatry no longer recommends f/u w/ them  outpatient for possible debridement but to f/u w/ a wound care center and consider hyperbaric oxygen to help w/ wound healing.     Assessment & Plan:   Principal Problem:   Osteomyelitis of ankle (HCC) Active Problems:   Tobacco use disorder   Depression   Iron  deficiency anemia   Thrombocytosis   1 nonhealing right lateral malleolus ulcer/cellulitis-probable vasculitis -Patient noted to P ANCA positive.  Chest x-ray, CT chest with interstitial lung disease. -Concern for possible Buerger's disease with history of smoking. -Right lower extremity ultrasound negative for DVT. -Patient was seen in consultation by podiatry who no longer recommends follow-up with them in the outpatient setting but to follow-up with the wound care center and consider hyperbaric oxygen to help with wound healing. -Patient seen by vascular surgery, underwent angiogram without intervention with normal common femoral artery, profunda femoris artery was small but normal, superficial femoral artery and popliteal arteries normal.  Typical tibial trifurcation with three-vessel runoff although all 3 vessels were small with poor flow into the foot and small vessel disease in the foot and ankle.  -Patient being followed by ID and cultures with abundant MRSA within mixed organisms.  -Patient was on Augmentin and transition to Bactrim DS x7 more days per ID recommendations. 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 -Hematology consulted and case discussed with rheumatology and hematology via secure chat and patient scheduled for inpatient dose of rituximab tomorrow morning. -Supportive care -Appreciate rheumatology, hematology, ID input and recommendations. -Follow.  2.  Depression -Stable. -It is noted that patient stopped taking her home dose of bupropion, Cymbalta, ariprazole. -Continue Celexa.   -Outpatient follow-up.    3.  Nicotine dependence -Cessation stressed to patient and patient seems motivated to quit.  -Continue  nicotine patch.    4.  Iron deficiency anemia -H&H stable. -Continue oral iron supplementation.   5.  Thrombocytosis -Likely reactive. -Trending down.   DVT prophylaxis: Lovenox Code Status: Full Family Communication: Updated patient.  No family at bedside. Disposition:   Status is: Inpatient  Remains inpatient appropriate because:IV treatments appropriate due to intensity of illness or inability to take PO  Dispo: The patient is from: Home              Anticipated d/c is to: Home              Patient currently is not medically stable to d/c.   Difficult to place patient No       Consultants:  Vascular surgery: Dr. Wyn Quaker 08/29/2020 Podiatry: Dr. Allena Katz 08/30/2020 Infectious disease: Dr. Avon Gully 09/01/2020 Rheumatology: Dr. Gavin Potters 09/01/2020 Hematology/oncology: Dr. Orlie Dakin    Procedures:  CT chest 09/02/2020 Chest x-ray 08/28/2020 Plain films of the right ankle 08/28/2020 Lower extremity Doppler 08/29/2020 Ultrasound guidance for vascular access left femoral artery/catheter placement into right SFA from left femoral approach/aortogram and selective right lower extremity angiogram/StarClose closure device left femoral artery per Dr. Wyn Quaker 09/01/2020   Antimicrobials:  Augmentin 09/01/2020>>>>> 09/03/2020 Bactrim DS 09/03/2020>>>> IV Rocephin 08/29/2020>>>> 09/01/2020 IV Flagyl 08/29/2020>>>> 09/01/2020 IV vancomycin 08/29/2020>>>> 08/31/2020    Subjective: Laying in bed.  No chest pain.  No shortness of breath.    Objective: Vitals:   09/03/20 2343 09/04/20 0527 09/04/20 0850 09/04/20 1149  BP: (!) 101/56 116/62 110/60 115/63  Pulse: 60 (!)  52 (!) 59 (!) 54  Resp: 18 18 18 16   Temp: 98.2 F (36.8 C) 98.4 F (36.9 C) 98.8 F (37.1 C) 98.2 F (36.8 C)  TempSrc:    Oral  SpO2: 99% 96% 96% 97%  Weight:      Height:        Intake/Output Summary (Last 24 hours) at 09/04/2020 1228 Last data filed at 09/03/2020 1807 Gross per 24 hour  Intake 60 ml  Output --  Net  60 ml    Filed Weights   08/28/20 1927 09/01/20 0937  Weight: 58.5 kg 58.5 kg    Examination:  General exam: NAD Respiratory system: Some diffuse fine crackles noted in the bases otherwise clear.  No wheezing.  No rhonchi.  Normal respiratory effort.   Cardiovascular system: Regular rate rhythm no murmurs rubs or gallops.  No JVD.  No lower extremity edema.  Gastrointestinal system: Abdomen is soft, nontender, nondistended, positive bowel sounds.  No rebound.  No guarding.  Central nervous system: Alert and oriented. No focal neurological deficits. Extremities: Symmetric 5 x 5 power. Skin: Large ulcer noted over right malleolus with gauze dressing around it.  Psychiatry: Judgement and insight appear normal. Mood & affect appropriate.     Data Reviewed: I have personally reviewed following labs and imaging studies  CBC: Recent Labs  Lab 08/28/20 2236 08/29/20 0450 08/31/20 0440 09/01/20 0620 09/02/20 0432 09/03/20 0533 09/04/20 0436  WBC 9.5   < > 8.4 8.4 5.5 13.5* 14.2*  NEUTROABS 5.6  --   --   --   --   --  12.5*  HGB 10.7*   < > 10.1* 10.8* 11.4* 10.9* 10.2*  HCT 35.0*   < > 32.4* 34.5* 36.1 35.1* 32.3*  MCV 77.6*   < > 77.1* 75.5* 75.2* 76.3* 76.5*  PLT 499*   < > 438* 494* 537* 542* 470*   < > = values in this interval not displayed.     Basic Metabolic Panel: Recent Labs  Lab 08/31/20 0440 09/01/20 0620 09/02/20 0432 09/03/20 0533 09/04/20 0436  NA 137 138 139 140 138  K 4.1 3.9 4.0 3.9 4.0  CL 105 104 106 107 107  CO2 28 27 25 24 24   GLUCOSE 99 91 160* 137* 125*  BUN 20 14 17 18  21*  CREATININE 0.52 0.48 0.45 0.41* 0.46  CALCIUM 8.3* 8.3* 9.0 8.4* 8.3*  MG  --   --   --   --  2.3     GFR: Estimated Creatinine Clearance: 68.8 mL/min (by C-G formula based on SCr of 0.46 mg/dL).  Liver Function Tests: Recent Labs  Lab 08/28/20 2236  AST 16  ALT 11  ALKPHOS 85  BILITOT 0.3  PROT 7.3  ALBUMIN 3.3*     CBG: No results for input(s):  GLUCAP in the last 168 hours.   Recent Results (from the past 240 hour(s))  Culture, blood (single) w Reflex to ID Panel     Status: None   Collection Time: 08/28/20 10:36 PM   Specimen: BLOOD  Result Value Ref Range Status   Specimen Description BLOOD RIGHT ANTECUBITAL  Final   Special Requests   Final    BOTTLES DRAWN AEROBIC AND ANAEROBIC Blood Culture adequate volume   Culture   Final    NO GROWTH 5 DAYS Performed at [facility], [address] [place], [place] [address]    Report Status 09/02/2020 FINAL  Final  Resp Panel by RT-PCR (Flu A&B,  Covid) Nasopharyngeal Swab     Status: None   Collection Time: 08/28/20 10:37 PM   Specimen: Nasopharyngeal Swab; Nasopharyngeal(NP) swabs in vial transport medium  Result Value Ref Range Status   SARS Coronavirus 2 by RT PCR NEGATIVE NEGATIVE Final    Comment: (NOTE) SARS-CoV-2 target nucleic acids are NOT DETECTED.  The SARS-CoV-2 RNA is generally detectable in upper respiratory specimens during the acute phase of infection. The lowest concentration of SARS-CoV-2 viral copies this assay can detect is 138 copies/mL. A negative result does not preclude SARS-Cov-2 infection and should not be used as the sole basis for treatment or other patient management decisions. A negative result may occur with  improper specimen collection/handling, submission of specimen other than nasopharyngeal swab, presence of viral mutation(s) within the areas targeted by this assay, and inadequate number of viral copies(<138 copies/mL). A negative result must be combined with clinical observations, patient history, and epidemiological information. The expected result is Negative.  Fact Sheet for Patients:  BloggerCourse.com  Fact Sheet for Healthcare Providers:  SeriousBroker.it  This test is no t yet approved or cleared by the [place] FDA and  has been authorized for detection and/or  diagnosis of SARS-CoV-2 by FDA under an Emergency Use Authorization (EUA). This EUA will remain  in effect (meaning this test can be used) for the duration of the COVID-19 declaration under Section 564(b)(1) of the Act, 21 U.S.C.section 360bbb-3(b)(1), unless the authorization is terminated  or revoked sooner.       Influenza A by PCR NEGATIVE NEGATIVE Final   Influenza B by PCR NEGATIVE NEGATIVE Final    Comment: (NOTE) The Xpert Xpress SARS-CoV-2/FLU/RSV plus assay is intended as an aid in the diagnosis of influenza from Nasopharyngeal swab specimens and should not be used as a sole basis for treatment. Nasal washings and aspirates are unacceptable for Xpert Xpress SARS-CoV-2/FLU/RSV testing.  Fact Sheet for Patients: BloggerCourse.com  Fact Sheet for Healthcare Providers: SeriousBroker.it  This test is not yet approved or cleared by the [place] FDA and has been authorized for detection and/or diagnosis of SARS-CoV-2 by FDA under an Emergency Use Authorization (EUA). This EUA will remain in effect (meaning this test can be used) for the duration of the COVID-19 declaration under Section 564(b)(1) of the Act, 21 U.S.C. section 360bbb-3(b)(1), unless the authorization is terminated or revoked.  Performed at [facility], [address] [place], [place] [address]   Blood culture (routine single)     Status: None   Collection Time: 08/28/20 10:39 PM   Specimen: BLOOD  Result Value Ref Range Status   Specimen Description BLOOD LEFT ANTECUBITAL  Final   Special Requests   Final    BOTTLES DRAWN AEROBIC AND ANAEROBIC Blood Culture adequate volume   Culture   Final    NO GROWTH 5 DAYS Performed at [facility], [address] [place], [place] [address]    Report Status 09/02/2020 FINAL  Final  MRSA PCR Screening     Status: None   Collection Time: 08/30/20  1:32 PM  Result Value Ref Range Status    MRSA by PCR NEGATIVE NEGATIVE Final    Comment:        The GeneXpert MRSA Assay (FDA approved for NASAL specimens only), is one component of a comprehensive MRSA colonization surveillance program. It is not intended to diagnose MRSA infection nor to guide or monitor treatment for MRSA infections. Performed at [facility], [address], [place], [place] [address]  Urine culture     Status: None   Collection Time: 09/01/20  9:15 AM   Specimen: In/Out Cath Urine  Result Value Ref Range Status   Specimen Description   Final    IN/OUT CATH URINE Performed at [facility], [address] [place], [place] [address]    Special Requests   Final    NONE Performed at [facility], [address] [place], [place] [address]    Culture   Final    NO GROWTH Performed at [facility], [address], [place], [place] [address]    Report Status 09/02/2020 FINAL  Final  Aerobic Culture w Gram Stain (superficial specimen)     Status: None (Preliminary result)   Collection Time: 09/01/20  2:40 PM   Specimen: Wound  Result Value Ref Range Status   Specimen Description   Final    WOUND Performed at [facility], [address], [place], [place] [address]    Special Requests   Final    NONE Performed at [facility], [address] [place], [place] [address]    Gram Stain   Final    RARE WBC PRESENT, PREDOMINANTLY PMN FEW GRAM NEGATIVE RODS FEW GRAM POSITIVE COCCI IN CLUSTERS    Culture   Final    ABUNDANT STAPHYLOCOCCUS AUREUS MODERATE STENOTROPHOMONAS MALTOPHILIA SUSCEPTIBILITIES TO FOLLOW Performed at [facility], [address], [place], [place] [address]    Report Status PENDING  Incomplete          Radiology Studies: CT CHEST W CONTRAST  Result Date: 09/02/2020 CLINICAL DATA:  Pulmonary nodule. EXAM: CT CHEST WITH CONTRAST TECHNIQUE: Multidetector CT imaging of the chest was performed during  intravenous contrast administration. CONTRAST:  75mL OMNIPAQUE IOHEXOL 300 MG/ML  SOLN COMPARISON:  06/05/2011 FINDINGS: Cardiovascular: The heart size is normal. No substantial pericardial effusion. No thoracic aortic aneurysm. Mediastinum/Nodes: 14 mm short axis right paratracheal node is similar to prior suggesting benign/reactive etiology. Small prevascular lymph nodes are also stable. Small lymph nodes in the hilar regions and subcarinal station are similar to prior. The esophagus has normal imaging features. There is no axillary lymphadenopathy. Lungs/Pleura: Subpleural reticulation noted in both lungs with a basilar predominance. Small patchy areas of ground-glass opacity are seen in both lungs. No dense focal airspace consolidation. No pleural effusion. Upper Abdomen: Unremarkable. Musculoskeletal: No worrisome lytic or sclerotic osseous abnormality. IMPRESSION: 1. Subpleural reticulation in both lungs with a basilar predominance. Small patchy areas of ground-glass opacity are seen in both lungs. Imaging features suggest underlying component of pulmonary fibrosis in UIP would be a consideration. Follow-up high-resolution chest CT recommended for more definitive evaluation. 2. Stable mild mediastinal and hilar lymphadenopathy, likely reactive. 3. Aortic Atherosclerosis (ICD10-I70.0). Electronically Signed   By: Kennith Center M.D.   On: 09/02/2020 15:09        Scheduled Meds:  vitamin C  250 mg Oral BID   cholecalciferol  1,000 Units Oral Daily   citalopram  20 mg Oral Daily   enoxaparin (LOVENOX) injection  40 mg Subcutaneous Q24H   ferrous gluconate  324 mg Oral BID WC   gabapentin  300 mg Oral TID   nicotine  21 mg Transdermal Daily   nutrition supplement (JUVEN)  1 packet Oral BID BM   pantoprazole  40 mg Oral Daily   Ensure Max Protein  11 oz Oral BID   sodium chloride flush  3 mL Intravenous Q12H   sulfamethoxazole-trimethoprim  1 tablet Oral Q12H   Continuous Infusions:  sodium  chloride Stopped (08/29/20 1709)     LOS: 7 days    Time spent: 35 minutes    Ramiro Harvest, MD [facility]   To contact the attending provider between 7A-7P or the covering provider during after hours 7P-7A, please log into the web site [email or web address] and access using universal [place] password for that web site. If you do not have the password, please call the hospital operator.  09/04/2020, 12:28 PM

## 2020-09-05 ENCOUNTER — Encounter: Payer: Self-pay | Admitting: Oncology

## 2020-09-05 ENCOUNTER — Inpatient Hospital Stay: Payer: Medicaid Other | Attending: Oncology

## 2020-09-05 VITALS — BP 89/44 | HR 75 | Temp 98.5°F | Resp 16

## 2020-09-05 DIAGNOSIS — I7789 Other specified disorders of arteries and arterioles: Secondary | ICD-10-CM | POA: Insufficient documentation

## 2020-09-05 DIAGNOSIS — I776 Arteritis, unspecified: Secondary | ICD-10-CM

## 2020-09-05 DIAGNOSIS — Z5112 Encounter for antineoplastic immunotherapy: Secondary | ICD-10-CM | POA: Diagnosis not present

## 2020-09-05 DIAGNOSIS — I9589 Other hypotension: Secondary | ICD-10-CM

## 2020-09-05 LAB — CBC WITH DIFFERENTIAL/PLATELET
Abs Immature Granulocytes: 0.11 10*3/uL — ABNORMAL HIGH (ref 0.00–0.07)
Basophils Absolute: 0 10*3/uL (ref 0.0–0.1)
Basophils Relative: 0 %
Eosinophils Absolute: 0 10*3/uL (ref 0.0–0.5)
Eosinophils Relative: 0 %
HCT: 32.7 % — ABNORMAL LOW (ref 36.0–46.0)
Hemoglobin: 10.2 g/dL — ABNORMAL LOW (ref 12.0–15.0)
Immature Granulocytes: 1 %
Lymphocytes Relative: 20 %
Lymphs Abs: 2.6 10*3/uL (ref 0.7–4.0)
MCH: 23.6 pg — ABNORMAL LOW (ref 26.0–34.0)
MCHC: 31.2 g/dL (ref 30.0–36.0)
MCV: 75.5 fL — ABNORMAL LOW (ref 80.0–100.0)
Monocytes Absolute: 0.7 10*3/uL (ref 0.1–1.0)
Monocytes Relative: 6 %
Neutro Abs: 9.3 10*3/uL — ABNORMAL HIGH (ref 1.7–7.7)
Neutrophils Relative %: 73 %
Platelets: 488 10*3/uL — ABNORMAL HIGH (ref 150–400)
RBC: 4.33 MIL/uL (ref 3.87–5.11)
RDW: 18.5 % — ABNORMAL HIGH (ref 11.5–15.5)
WBC: 12.7 10*3/uL — ABNORMAL HIGH (ref 4.0–10.5)
nRBC: 0 % (ref 0.0–0.2)

## 2020-09-05 LAB — BASIC METABOLIC PANEL
Anion gap: 6 (ref 5–15)
BUN: 25 mg/dL — ABNORMAL HIGH (ref 6–20)
CO2: 26 mmol/L (ref 22–32)
Calcium: 8.4 mg/dL — ABNORMAL LOW (ref 8.9–10.3)
Chloride: 105 mmol/L (ref 98–111)
Creatinine, Ser: 0.58 mg/dL (ref 0.44–1.00)
GFR, Estimated: >60 mL/min (ref >60)
Glucose, Bld: 107 mg/dL — ABNORMAL HIGH (ref 70–99)
Potassium: 4.3 mmol/L (ref 3.5–5.1)
Sodium: 137 mmol/L (ref 135–145)

## 2020-09-05 MED ORDER — SODIUM CHLORIDE 0.9 % IV SOLN
Freq: Once | INTRAVENOUS | Status: AC
  Filled 2020-09-05: qty 250

## 2020-09-05 MED ORDER — DIPHENHYDRAMINE HCL 50 MG/ML IJ SOLN
25.0000 mg | Freq: Once | INTRAMUSCULAR | Status: AC
  Administered 2020-09-05: 25 mg via INTRAVENOUS
  Filled 2020-09-05: qty 1

## 2020-09-05 MED ORDER — DIPHENOXYLATE-ATROPINE 2.5-0.025 MG/5ML PO LIQD: 5.0000 mL | Freq: Four times a day (QID) | ORAL | Status: DC | PRN

## 2020-09-05 MED ORDER — SODIUM CHLORIDE 0.9 % IV SOLN
1000.0000 mg | Freq: Once | INTRAVENOUS | Status: AC
  Administered 2020-09-05: 1000 mg via INTRAVENOUS
  Filled 2020-09-05: qty 100

## 2020-09-05 MED ORDER — SODIUM CHLORIDE 0.9 % IV BOLUS
500.0000 mL | Freq: Once | INTRAVENOUS | Status: AC
  Administered 2020-09-05: 500 mL via INTRAVENOUS

## 2020-09-05 MED ORDER — LOPERAMIDE HCL 2 MG PO CAPS
4.0000 mg | ORAL_CAPSULE | Freq: Three times a day (TID) | ORAL | Status: DC
  Administered 2020-09-05 – 2020-09-07 (×7): 4 mg via ORAL
  Filled 2020-09-05 (×8): qty 2

## 2020-09-05 MED ORDER — ACETAMINOPHEN 325 MG PO TABS
650.0000 mg | ORAL_TABLET | Freq: Once | ORAL | Status: AC
  Administered 2020-09-05: 650 mg via ORAL
  Filled 2020-09-05: qty 2

## 2020-09-05 NOTE — Patient Instructions (Signed)
[facility] [place] [facility]  Discharge Instructions: Thank you for choosing [facility] to provide your oncology and hematology care.  If you have a lab appointment with the Cancer Center, please go directly to the Cancer Center and check in at the registration area.  Wear comfortable clothing and clothing appropriate for easy access to any Portacath or PICC line.   We strive to give you quality time with your provider. You may need to reschedule your appointment if you arrive late (15 or more minutes).  Arriving late affects you and other patients whose appointments are after yours.  Also, if you miss three or more appointments without notifying the office, you may be dismissed from the clinic at the provider's discretion.      For prescription refill requests, have your pharmacy contact our office and allow 72 hours for refills to be completed.    Today you received the following chemotherapy and/or immunotherapy agents : Rituxan    To help prevent nausea and vomiting after your treatment, we encourage you to take your nausea medication as directed.  BELOW ARE SYMPTOMS THAT SHOULD BE REPORTED IMMEDIATELY: *FEVER GREATER THAN 100.4 F (38 C) OR HIGHER *CHILLS OR SWEATING *NAUSEA AND VOMITING THAT IS NOT CONTROLLED WITH YOUR NAUSEA MEDICATION *UNUSUAL SHORTNESS OF BREATH *UNUSUAL BRUISING OR BLEEDING *URINARY PROBLEMS (pain or burning when urinating, or frequent urination) *BOWEL PROBLEMS (unusual diarrhea, constipation, pain near the anus) TENDERNESS IN MOUTH AND THROAT WITH OR WITHOUT PRESENCE OF ULCERS (sore throat, sores in mouth, or a toothache) UNUSUAL RASH, SWELLING OR PAIN  UNUSUAL VAGINAL DISCHARGE OR ITCHING   Items with * indicate a potential emergency and should be followed up as soon as possible or go to the Emergency Department if any problems should occur.  Please show the CHEMOTHERAPY ALERT CARD or IMMUNOTHERAPY ALERT CARD at check-in to  the Emergency Department and triage nurse.  Should you have questions after your visit or need to cancel or reschedule your appointment, please contact [facility] [place] [facility]  [phone] and follow the prompts.  Office hours are 8:00 a.m. to 4:30 p.m. Monday - Friday. Please note that voicemails left after 4:00 p.m. may not be returned until the following business day.  We are closed weekends and major holidays. You have access to a nurse at all times for urgent questions. Please call the main number to the clinic [phone] and follow the prompts.  For any non-urgent questions, you may also contact your provider using MyChart. We now offer e-Visits for anyone 18 and older to request care online for non-urgent symptoms. For details visit [email or web address].   Also download the MyChart app! Go to the app store, search "MyChart", open the app, select [place], and log in with your MyChart username and password.  Due to Covid, a mask is required upon entering the hospital/clinic. If you do not have a mask, one will be given to you upon arrival. For doctor visits, patients may have 1 support person aged 18 or older with them. For treatment visits, patients cannot have anyone with them due to current Covid guidelines and our immunocompromised population.  

## 2020-09-05 NOTE — Progress Notes (Signed)
PROGRESS NOTE    Alexandra [name]  NWG:[id number] DOB: [birth year] DOA: 08/28/2020 PCP: [facility]    Chief Complaint  Patient presents with   Wound Check    Brief Narrative:  HPI was taken from Dr. Joylene Igo: Alexandra [name] is a 50 y.o. 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 Patient states that she has had this ulcer for 3 months, it is nonhealing and appears to be extending to the anterior surface of her right leg.  She rates her pain a 10 x 10 in intensity at its worst and pain is usually at rest.  When she is ambulating or standing on her feet she does not feel any pain. She has increased purulent drainage from the wound but denies having any fever or chills.  She has felt unwell and complains of nausea without any emesis or abdominal pain. She was hospitalized 1 month ago and was discharged home on doxycycline which she completed without any significant improvement.  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  Patient was referred to rheumatology and has an appointment scheduled for July 7. She denies having any chest pain,  no headache, no cough, no abdominal pain, no changes in her bowel habits, no dizziness, no lightheadedness no urinary symptoms or changes in her bowel habits. Labs showed sodium 138, potassium 3.5, chloride 105, bicarb 25, glucose 88, BUN 15, creatinine 0.75, calcium 8.7, alkaline phosphatase 85, albumin 3.3, AST 16, ALT 11, total protein 7.3, lactic acid 1.4, white count 9.5, hemoglobin 10.7, hematocrit 35.0, MCV 77.6, RDW 18.2, platelet count  499, PT 13.9, INR 1.1 Respiratory viral panel is negative Right ankle x-ray shows diffuse soft tissue swelling or more focal ulceration over the lateral malleolus and distal fibular diaphysis where some early periosteal reaction is suggested. May implicate reactive periostitis or early osteomyelitis. Chest x-ray reviewed by me shows coarse reticular and patchy opacities in the lung bases favor superimposed edematous change given vascular congestion, septal thickening and cuffing. Underlying infection is difficult to fully exclude. Low volumes with additional features of atelectasis. Twelve-lead EKG reviewed by me shows sinus rhythm.       ED Course: Patient is a 49 year old female who presents to the ER for evaluation of pain and increased purulent drainage from a chronic ulcer over her right ankle. Imaging shows early periosteal reaction which could be reactive periostitis versus early osteomyelitis. She will be admitted to the hospital for further evaluation.     Hospital course from Dr. Mayford Knife 6/17-6/21/22: Pt was found to have nonhealing right lateral malleolus ulcer & possible cellulitis. Pt has a positive RF and P-ANCA and there is a concern for vasculitis. Rheumatology and ID are following the pt. Other serologies were sent as per rheum (see rheum's note). Pt was started on IV steroids x 3 days and CT chest ordered to further evaluate reticular nodular lesions in the lungs was ordered as per rheum.Of note, pt is now on po augmentin as per ID and podiatry no longer recommends f/u w/ them outpatient for possible debridement but to f/u w/ a wound care center and consider hyperbaric oxygen to help w/ wound healing.  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  Chest x-ray, CT chest with interstitial lung disease. -Concern for possible Buerger's disease with history of smoking. -Right lower extremity ultrasound negative for DVT. -Patient was seen in consultation by podiatry who no longer recommends follow-up with them in the outpatient setting but to follow-up with the wound care center and consider hyperbaric oxygen to help with wound healing. -Patient seen by vascular surgery, underwent angiogram without intervention with normal common femoral artery, profunda femoris artery was small but normal, superficial femoral artery and popliteal arteries normal.  Typical tibial trifurcation with three-vessel runoff although all 3 vessels were small with poor flow into the foot and small vessel disease in the foot and ankle.  -Patient being followed by ID and cultures with abundant MRSA within mixed organisms and stenotrophomonas.  -Patient was on Augmentin and transitioned to Bactrim DS to complete a 7-day course of treatment.  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 -Hematology consulted and case discussed with rheumatology and hematology via secure chat and patient seen in consultation as well by Dr. Orlie Dakin and patient scheduled for first inpatient dose of rituximab today at the cancer center with recommendations from rheumatology to repeat dosing in 2 weeks as outpatient which will be arranged for September 19, 2020.  -In discussion with ID and hematology given the COVID-vaccine just prior to rituximab would not be helpful nor effective per ID and options are Evushield after rituximab and vaccine later on. -Supportive care -Appreciate rheumatology, hematology, ID input and  recommendations. -Follow.  2.  Depression -Stable. -It is noted that patient stopped taking her home dose of bupropion, Cymbalta, ariprazole. -Currently on Celexa.   -Outpatient follow-up.  3.  Nicotine dependence -Cessation stressed to patient and patient seems motivated to quit.  -Continue nicotine patch.    4.  Iron deficiency anemia -Continue oral iron supplementation.   -H&H stable. -May benefit from IV iron in the future which may be done in the outpatient setting.  5.  Thrombocytosis -Likely reactive. -Trending down.  6.  Diarrhea -Patient with complaints of combination of watery and mushy stools with multiple bouts which she feels might be antibiotic related.  We will place on scheduled Imodium 4 mg 3 times daily.  Lomotil as needed.   DVT prophylaxis: Lovenox Code Status: Full Family Communication: Updated patient.  No family at bedside. Disposition:   Status is: Inpatient  Remains inpatient appropriate because:IV treatments appropriate due to intensity of illness or inability to take PO  Dispo: The patient is from: Home              Anticipated d/c is to: Home              Patient currently is not medically stable to d/c.   Difficult to place patient No       Consultants:  Vascular surgery: Dr. Wyn Quaker 08/29/2020 Podiatry: Dr. Allena Katz 08/30/2020 Infectious disease: Dr. Avon Gully 09/01/2020 Rheumatology: Dr. Gavin Potters 09/01/2020 Hematology/oncology: Dr. Orlie Dakin    Procedures:  CT chest 09/02/2020 Chest x-ray 08/28/2020 Plain films of the  right ankle 08/28/2020 Lower extremity Doppler 08/29/2020 Ultrasound guidance for vascular access left femoral artery/catheter placement into right SFA from left femoral approach/aortogram and selective right lower extremity angiogram/StarClose closure device left femoral artery per Dr. Wyn Quaker 09/01/2020   Antimicrobials:  Augmentin 09/01/2020>>>>> 09/03/2020 Bactrim DS 09/03/2020>>>> IV Rocephin 08/29/2020>>>> 09/01/2020 IV Flagyl  08/29/2020>>>> 09/01/2020 IV vancomycin 08/29/2020>>>> 08/31/2020    Subjective: Sitting up in bed.  No chest pain.  No shortness of breath.  States still having a mix of watery and mushy loose stools.    Objective: Vitals:   09/05/20 0013 09/05/20 0453 09/05/20 0739 09/05/20 0740  BP: (!) 101/51 117/61 (!) 103/52   Pulse: (!) 51 (!) 51 (!) 49 (!) 53  Resp: 16 16 15    Temp: 98.1 F (36.7 C) 98.3 F (36.8 C) 98.3 F (36.8 C)   TempSrc: Oral Oral Oral   SpO2: 98% 97% 97% 98%  Weight:      Height:        Intake/Output Summary (Last 24 hours) at 09/05/2020 0746 Last data filed at 09/04/2020 1402 Gross per 24 hour  Intake 240 ml  Output --  Net 240 ml    Filed Weights   08/28/20 1927 09/01/20 0937  Weight: 58.5 kg 58.5 kg    Examination:  General exam: NAD Respiratory system: Some diffuse crackles noted in the bases otherwise clear.  No wheezing.  No rhonchi.  Normal respiratory effort.  Cardiovascular system: RRR no murmurs rubs or gallops.  No JVD.  No lower extremity edema. Gastrointestinal system: Abdomen is soft, nontender, nondistended, positive bowel sounds.  No rebound.  No guarding.   Central nervous system: Alert and oriented.  No focal neurological deficits. Extremities: Symmetric 5 x 5 power. Skin: Large ulcer noted over right malleolus with gauze dressing around it.  Psychiatry: Judgement and insight appear normal. Mood & affect appropriate.     Data Reviewed: I have personally reviewed following labs and imaging studies  CBC: Recent Labs  Lab 09/01/20 0620 09/02/20 0432 09/03/20 0533 09/04/20 0436 09/05/20 0553  WBC 8.4 5.5 13.5* 14.2* 12.7*  NEUTROABS  --   --   --  12.5* 9.3*  HGB 10.8* 11.4* 10.9* 10.2* 10.2*  HCT 34.5* 36.1 35.1* 32.3* 32.7*  MCV 75.5* 75.2* 76.3* 76.5* 75.5*  PLT 494* 537* 542* 470* 488*     Basic Metabolic Panel: Recent Labs  Lab 09/01/20 0620 09/02/20 0432 09/03/20 0533 09/04/20 0436 09/05/20 0553  NA 138 139 140  138 137  K 3.9 4.0 3.9 4.0 4.3  CL 104 106 107 107 105  CO2 27 25 24 24 26   GLUCOSE 91 160* 137* 125* 107*  BUN 14 17 18  21* 25*  CREATININE 0.48 0.45 0.41* 0.46 0.58  CALCIUM 8.3* 9.0 8.4* 8.3* 8.4*  MG  --   --   --  2.3  --      GFR: Estimated Creatinine Clearance: 68.8 mL/min (by C-G formula based on SCr of 0.58 mg/dL).  Liver Function Tests: No results for input(s): AST, ALT, ALKPHOS, BILITOT, PROT, ALBUMIN in the last 168 hours.   CBG: No results for input(s): GLUCAP in the last 168 hours.   Recent Results (from the past 240 hour(s))  Culture, blood (single) w Reflex to ID Panel     Status: None   Collection Time: 08/28/20 10:36 PM   Specimen: BLOOD  Result Value Ref Range Status   Specimen Description BLOOD RIGHT ANTECUBITAL  Final   Special Requests  Final    BOTTLES DRAWN AEROBIC AND ANAEROBIC Blood Culture adequate volume   Culture   Final    NO GROWTH 5 DAYS Performed at [facility], [address] [place], [place] [address]    Report Status 09/02/2020 FINAL  Final  Resp Panel by RT-PCR (Flu A&B, Covid) Nasopharyngeal Swab     Status: None   Collection Time: 08/28/20 10:37 PM   Specimen: Nasopharyngeal Swab; Nasopharyngeal(NP) swabs in vial transport medium  Result Value Ref Range Status   SARS Coronavirus 2 by RT PCR NEGATIVE NEGATIVE Final    Comment: (NOTE) SARS-CoV-2 target nucleic acids are NOT DETECTED.  The SARS-CoV-2 RNA is generally detectable in upper respiratory specimens during the acute phase of infection. The lowest concentration of SARS-CoV-2 viral copies this assay can detect is 138 copies/mL. A negative result does not preclude SARS-Cov-2 infection and should not be used as the sole basis for treatment or other patient management decisions. A negative result may occur with  improper specimen collection/handling, submission of specimen other than nasopharyngeal swab, presence of viral mutation(s) within the areas targeted by  this assay, and inadequate number of viral copies(<138 copies/mL). A negative result must be combined with clinical observations, patient history, and epidemiological information. The expected result is Negative.  Fact Sheet for Patients:  BloggerCourse.com  Fact Sheet for Healthcare Providers:  SeriousBroker.it  This test is no t yet approved or cleared by the [place] FDA and  has been authorized for detection and/or diagnosis of SARS-CoV-2 by FDA under an Emergency Use Authorization (EUA). This EUA will remain  in effect (meaning this test can be used) for the duration of the COVID-19 declaration under Section 564(b)(1) of the Act, 21 U.S.C.section 360bbb-3(b)(1), unless the authorization is terminated  or revoked sooner.       Influenza A by PCR NEGATIVE NEGATIVE Final   Influenza B by PCR NEGATIVE NEGATIVE Final    Comment: (NOTE) The Xpert Xpress SARS-CoV-2/FLU/RSV plus assay is intended as an aid in the diagnosis of influenza from Nasopharyngeal swab specimens and should not be used as a sole basis for treatment. Nasal washings and aspirates are unacceptable for Xpert Xpress SARS-CoV-2/FLU/RSV testing.  Fact Sheet for Patients: BloggerCourse.com  Fact Sheet for Healthcare Providers: SeriousBroker.it  This test is not yet approved or cleared by the [place] FDA and has been authorized for detection and/or diagnosis of SARS-CoV-2 by FDA under an Emergency Use Authorization (EUA). This EUA will remain in effect (meaning this test can be used) for the duration of the COVID-19 declaration under Section 564(b)(1) of the Act, 21 U.S.C. section 360bbb-3(b)(1), unless the authorization is terminated or revoked.  Performed at [facility], [address] [place], [place] [address]   Blood culture (routine single)     Status: None   Collection Time:  08/28/20 10:39 PM   Specimen: BLOOD  Result Value Ref Range Status   Specimen Description BLOOD LEFT ANTECUBITAL  Final   Special Requests   Final    BOTTLES DRAWN AEROBIC AND ANAEROBIC Blood Culture adequate volume   Culture   Final    NO GROWTH 5 DAYS Performed at [facility], [address] [place], [place] [address]    Report Status 09/02/2020 FINAL  Final  MRSA PCR Screening     Status: None   Collection Time: 08/30/20  1:32 PM  Result Value Ref Range Status   MRSA by PCR NEGATIVE NEGATIVE Final    Comment:  The GeneXpert MRSA Assay (FDA approved for NASAL specimens only), is one component of a comprehensive MRSA colonization surveillance program. It is not intended to diagnose MRSA infection nor to guide or monitor treatment for MRSA infections. Performed at [facility], [address] [place], [place] [address]   Urine culture     Status: None   Collection Time: 09/01/20  9:15 AM   Specimen: In/Out Cath Urine  Result Value Ref Range Status   Specimen Description   Final    IN/OUT CATH URINE Performed at [facility], [address] [place], [place] [address]    Special Requests   Final    NONE Performed at [facility], [address] [place], [place] [address]    Culture   Final    NO GROWTH Performed at [facility], [address] [place], [place] [address]    Report Status 09/02/2020 FINAL  Final  Aerobic Culture w Gram Stain (superficial specimen)     Status: None   Collection Time: 09/01/20  2:40 PM   Specimen: Wound  Result Value Ref Range Status   Specimen Description   Final    WOUND Performed at [facility], [address] [place], [place] [address]    Special Requests   Final    NONE Performed at [facility], [address] [place], [place] [address]    Gram Stain   Final    RARE WBC PRESENT, PREDOMINANTLY PMN FEW GRAM NEGATIVE RODS FEW GRAM POSITIVE COCCI IN  CLUSTERS    Culture   Final    ABUNDANT METHICILLIN RESISTANT STAPHYLOCOCCUS AUREUS WITHIN MIXED ORGANISMS Performed at [facility], [address] [place], [place] [address]    Report Status 09/04/2020 FINAL  Final   Organism ID, Bacteria METHICILLIN RESISTANT STAPHYLOCOCCUS AUREUS  Final      Susceptibility   Methicillin resistant staphylococcus aureus - MIC*    CIPROFLOXACIN <=0.5 SENSITIVE Sensitive     ERYTHROMYCIN <=0.25 SENSITIVE Sensitive     GENTAMICIN <=0.5 SENSITIVE Sensitive     OXACILLIN >=4 RESISTANT Resistant     TETRACYCLINE >=16 RESISTANT Resistant     VANCOMYCIN <=0.5 SENSITIVE Sensitive     TRIMETH/SULFA <=10 SENSITIVE Sensitive     CLINDAMYCIN <=0.25 SENSITIVE Sensitive     RIFAMPIN <=0.5 SENSITIVE Sensitive     Inducible Clindamycin NEGATIVE Sensitive     * ABUNDANT METHICILLIN RESISTANT STAPHYLOCOCCUS AUREUS          Radiology Studies: No results found.      Scheduled Meds:  vitamin C  250 mg Oral BID   cholecalciferol  1,000 Units Oral Daily   citalopram  20 mg Oral Daily   enoxaparin (LOVENOX) injection  40 mg Subcutaneous Q24H   feeding supplement  1 Container Oral TID BM   ferrous gluconate  324 mg Oral BID WC   gabapentin  300 mg Oral TID   nicotine  21 mg Transdermal Daily   nutrition supplement (JUVEN)  1 packet Oral BID BM   pantoprazole  40 mg Oral Daily   sodium chloride flush  3 mL Intravenous Q12H   sulfamethoxazole-trimethoprim  1 tablet Oral Q12H   Continuous Infusions:  sodium chloride Stopped (08/29/20 1709)     LOS: 8 days    Time spent: 35 minutes    Ramiro Harvestaniel Dixon Luczak, MD [facility]   To contact the attending provider between 7A-7P or the covering provider during after hours 7P-7A, please  log into the web site [email or web address] and access using universal [place] password for that web site. If you do not have the password, please call the hospital operator.  09/05/2020, 7:46 AM

## 2020-09-05 NOTE — Progress Notes (Signed)
BP with 3rd increase on Rituxan: 86/44. This has been around patient's baseline inpatient on 6/20 & 6/21. Patient denies any s/s at this time and states that she "feels fine". Will recheck VS in 15 minutes as opposed to 30 minute interval. Dr. Orlie Dakin and Mignon Pine, NP made aware.   See Flowsheets for proceeding VS.   1207: BP 80/53 (15 minutes into 4th rate of Rituxan). Patient denies any s/s at this time. Dr. Orlie Dakin made aware. Per Dr. Orlie Dakin, okay to proceed with titration.   1225: BP 78/60 HR 58. Patient ready for 5th titration rate. Patient asymptomatic. Continued patient on 4th rate. Made Dr. Orlie Dakin aware. Per Dr. Orlie Dakin, okay to increase rate to 5th titration rate and give 500cc NS bolus.   1410: BP did well with IVFs (see flowsheet). Patient ready to titrate to last rate. BP 74/64 HR 69. Patient is c/o headache at this time. Dr. Orlie Dakin made aware. Maintained same rate and rechecked BP after approx. 20 minutes. Rechecked BP 89/45. Dr., Orlie Dakin made aware and rate increased to final rate.   Patient completed Rituxan infusion. Final BP 89/44 HR 75. Dr. Orlie Dakin made aware.   1524: Report provided to Caralee Ates, RN and per RN, no further questions.

## 2020-09-06 ENCOUNTER — Inpatient Hospital Stay: Payer: Medicaid Other

## 2020-09-06 DIAGNOSIS — L97319 Non-pressure chronic ulcer of right ankle with unspecified severity: Secondary | ICD-10-CM

## 2020-09-06 DIAGNOSIS — I7409 Other arterial embolism and thrombosis of abdominal aorta: Secondary | ICD-10-CM

## 2020-09-06 DIAGNOSIS — M79671 Pain in right foot: Secondary | ICD-10-CM | POA: Diagnosis not present

## 2020-09-06 LAB — BASIC METABOLIC PANEL
Anion gap: 7 (ref 5–15)
BUN: 13 mg/dL (ref 6–20)
CO2: 27 mmol/L (ref 22–32)
Calcium: 8.1 mg/dL — ABNORMAL LOW (ref 8.9–10.3)
Chloride: 102 mmol/L (ref 98–111)
Creatinine, Ser: 0.46 mg/dL (ref 0.44–1.00)
GFR, Estimated: >60 mL/min (ref >60)
Glucose, Bld: 121 mg/dL — ABNORMAL HIGH (ref 70–99)
Potassium: 3.9 mmol/L (ref 3.5–5.1)
Sodium: 136 mmol/L (ref 135–145)

## 2020-09-06 LAB — CBC WITH DIFFERENTIAL/PLATELET
Abs Immature Granulocytes: 0.05 10*3/uL (ref 0.00–0.07)
Basophils Absolute: 0 10*3/uL (ref 0.0–0.1)
Basophils Relative: 0 %
Eosinophils Absolute: 0 10*3/uL (ref 0.0–0.5)
Eosinophils Relative: 0 %
HCT: 34 % — ABNORMAL LOW (ref 36.0–46.0)
Hemoglobin: 10.8 g/dL — ABNORMAL LOW (ref 12.0–15.0)
Immature Granulocytes: 1 %
Lymphocytes Relative: 10 %
Lymphs Abs: 0.6 10*3/uL — ABNORMAL LOW (ref 0.7–4.0)
MCH: 24 pg — ABNORMAL LOW (ref 26.0–34.0)
MCHC: 31.8 g/dL (ref 30.0–36.0)
MCV: 75.6 fL — ABNORMAL LOW (ref 80.0–100.0)
Monocytes Absolute: 0.3 10*3/uL (ref 0.1–1.0)
Monocytes Relative: 5 %
Neutro Abs: 5.5 10*3/uL (ref 1.7–7.7)
Neutrophils Relative %: 84 %
Platelets: 401 10*3/uL — ABNORMAL HIGH (ref 150–400)
RBC: 4.5 MIL/uL (ref 3.87–5.11)
RDW: 18.4 % — ABNORMAL HIGH (ref 11.5–15.5)
WBC: 6.5 10*3/uL (ref 4.0–10.5)
nRBC: 0 % (ref 0.0–0.2)

## 2020-09-06 LAB — HEPARIN LEVEL (UNFRACTIONATED): Heparin Unfractionated: 0.78 IU/mL — ABNORMAL HIGH (ref 0.30–0.70)

## 2020-09-06 LAB — MAGNESIUM: Magnesium: 2.2 mg/dL (ref 1.7–2.4)

## 2020-09-06 MED ORDER — GABAPENTIN 400 MG PO CAPS
400.0000 mg | ORAL_CAPSULE | Freq: Three times a day (TID) | ORAL | Status: DC
  Administered 2020-09-06 – 2020-09-16 (×29): 400 mg via ORAL
  Filled 2020-09-06 (×31): qty 1

## 2020-09-06 MED ORDER — IOHEXOL 350 MG/ML SOLN: 75.0000 mL | Freq: Once | INTRAVENOUS | Status: DC | PRN

## 2020-09-06 MED ORDER — HYDROMORPHONE HCL 1 MG/ML IJ SOLN: 1.0000 mg | INTRAMUSCULAR | Status: DC | PRN

## 2020-09-06 MED ORDER — HYDROMORPHONE HCL 1 MG/ML IJ SOLN
1.0000 mg | INTRAMUSCULAR | Status: DC | PRN
  Administered 2020-09-06 (×3): 1 mg via INTRAVENOUS
  Administered 2020-09-07 (×2): 2 mg via INTRAVENOUS
  Administered 2020-09-07: 1 mg via INTRAVENOUS
  Administered 2020-09-07 – 2020-09-09 (×5): 2 mg via INTRAVENOUS
  Filled 2020-09-06 (×2): qty 2
  Filled 2020-09-06: qty 1
  Filled 2020-09-06: qty 2
  Filled 2020-09-06: qty 1
  Filled 2020-09-06 (×2): qty 2
  Filled 2020-09-06: qty 1
  Filled 2020-09-06 (×3): qty 2
  Filled 2020-09-06: qty 1

## 2020-09-06 MED ORDER — HYDROMORPHONE HCL 1 MG/ML IJ SOLN
1.0000 mg | INTRAMUSCULAR | Status: DC | PRN
Start: 2020-09-06 — End: 2020-09-06
  Administered 2020-09-06: 10:00:00 1 mg via INTRAVENOUS
  Filled 2020-09-06: qty 1

## 2020-09-06 MED ORDER — HYDROCODONE-ACETAMINOPHEN 5-325 MG PO TABS
2.0000 | ORAL_TABLET | Freq: Four times a day (QID) | ORAL | Status: DC | PRN
  Administered 2020-09-06 – 2020-09-09 (×7): 2 via ORAL
  Filled 2020-09-06 (×7): qty 2

## 2020-09-06 MED ORDER — KETOROLAC TROMETHAMINE 30 MG/ML IJ SOLN
30.0000 mg | Freq: Once | INTRAMUSCULAR | Status: AC
  Administered 2020-09-06: 30 mg via INTRAVENOUS
  Filled 2020-09-06: qty 1

## 2020-09-06 MED ORDER — HEPARIN (PORCINE) 25000 UT/250ML-% IV SOLN
950.0000 [IU]/h | INTRAVENOUS | Status: DC
  Administered 2020-09-06: 1000 [IU]/h via INTRAVENOUS
  Filled 2020-09-06: qty 250

## 2020-09-06 MED ORDER — CHOLESTYRAMINE LIGHT 4 G PO PACK
4.0000 g | PACK | Freq: Two times a day (BID) | ORAL | Status: DC
  Administered 2020-09-06 – 2020-09-07 (×4): 4 g via ORAL
  Filled 2020-09-06 (×6): qty 1

## 2020-09-06 MED ORDER — ATORVASTATIN CALCIUM 20 MG PO TABS
40.0000 mg | ORAL_TABLET | Freq: Every day | ORAL | Status: DC
  Administered 2020-09-06 – 2020-09-16 (×11): 40 mg via ORAL
  Filled 2020-09-06 (×11): qty 2

## 2020-09-06 MED ORDER — HEPARIN BOLUS VIA INFUSION
4000.0000 [IU] | Freq: Once | INTRAVENOUS | Status: AC
  Administered 2020-09-06: 4000 [IU] via INTRAVENOUS
  Filled 2020-09-06: qty 4000

## 2020-09-06 MED ORDER — HYDROCODONE-ACETAMINOPHEN 5-325 MG PO TABS
1.0000 | ORAL_TABLET | Freq: Four times a day (QID) | ORAL | Status: DC | PRN
Start: 2020-09-06 — End: 2020-09-06
  Administered 2020-09-06: 07:00:00 1 via ORAL
  Filled 2020-09-06: qty 1

## 2020-09-06 MED ORDER — IOHEXOL 350 MG/ML SOLN
100.0000 mL | Freq: Once | INTRAVENOUS | Status: AC | PRN
  Administered 2020-09-06: 18:00:00 100 mL via INTRAVENOUS

## 2020-09-06 NOTE — Progress Notes (Signed)
Subjective  -   I was asked to reevaluate the patient because of acute onset of worsening pain and numbness in her right foot which began at 5 AM this morning.  She has a right ankle ulcer that has been present for 3 months.  The wound was biopsied recently and was positive for P ANCA and rheumatoid factor.  This was concerning for vasculitis.  She was started on IV steroids.  She underwent rituximab infusion. vascular surgery was consulted, and on 620 she underwent angiography which revealed normal appearance of the common femoral profundofemoral, superficial femoral, and popliteal artery.  She had three-vessel runoff however all 3 vessels were small with poor flow onto the foot and small vessel disease out onto the foot.  There was concerns for Buerger's disease.   Physical Exam:  By Doppler, there is a normal sounding popliteal artery as well as peroneal, posterior tibial, and anterior tibial in the lower right leg.  I was unable to hear any Doppler signals across the ankle.  She is able to move her first 3 toes however not toes 4 and 5.  The foot is cool.  She does not have sensation in the foot until you get to the ankle.       Assessment/Plan:    I am concerned about an ischemic right foot however based off of arteriogram and physical exam today, I am not sure that acute vascular intervention will be beneficial.  I think she needs to be started on IV heparin.  I will go ahead and get a CT angiogram to determine whether or not there has been a change in the blood flow to her foot from her arteriogram.  I did discuss that this is a limb threatening situation with the patient.  Hopefully by placing her leg in a dependent position, and IV heparin, her foot will improve.  I will follow-up after her CT scan.  Wells Marquarius Lofton 09/06/2020 4:36 PM --  Vitals:   09/06/20 1141 09/06/20 1541  BP: (!) 121/57 (!) 115/49  Pulse: (!) 58 70  Resp: 17 18  Temp: 98.4 F (36.9 C) 98.5 F (36.9 C)   SpO2: 98% 94%    Intake/Output Summary (Last 24 hours) at 09/06/2020 1636 Last data filed at 09/06/2020 1520 Gross per 24 hour  Intake 284.01 ml  Output --  Net 284.01 ml     Laboratory CBC    Component Value Date/Time   WBC 6.5 09/06/2020 0512   HGB 10.8 (L) 09/06/2020 0512   HGB 13.5 11/08/2013 1424   HCT 34.0 (L) 09/06/2020 0512   HCT 42.1 11/08/2013 1424   PLT 401 (H) 09/06/2020 0512   PLT 304 11/08/2013 1424    BMET    Component Value Date/Time   NA 136 09/06/2020 0512   NA 133 (L) 11/08/2013 1424   K 3.9 09/06/2020 0512   K 3.7 11/08/2013 1424   CL 102 09/06/2020 0512   CL 100 11/08/2013 1424   CO2 27 09/06/2020 0512   CO2 23 11/08/2013 1424   GLUCOSE 121 (H) 09/06/2020 0512   GLUCOSE 92 11/08/2013 1424   BUN 13 09/06/2020 0512   BUN 10 11/08/2013 1424   CREATININE 0.46 09/06/2020 0512   CREATININE 0.73 11/08/2013 1424   CALCIUM 8.1 (L) 09/06/2020 0512   CALCIUM 8.3 (L) 11/08/2013 1424   GFRNONAA >60 09/06/2020 0512   GFRNONAA >60 11/08/2013 1424   GFRAA >60 03/25/2017 1955   GFRAA >60 11/08/2013 1424  COAG Lab Results  Component Value Date   INR 1.1 08/28/2020   No results found for: PTT  Antibiotics Anti-infectives (From admission, onward)    Start     Dose/Rate Route Frequency Ordered Stop   09/03/20 1500  sulfamethoxazole-trimethoprim (BACTRIM DS) 800-160 MG per tablet 1 tablet        1 tablet Oral Every 12 hours 09/03/20 1411     09/01/20 2200  amoxicillin-clavulanate (AUGMENTIN) 875-125 MG per tablet 1 tablet  Status:  Discontinued        1 tablet Oral Every 12 hours 09/01/20 1521 09/03/20 1411   08/31/20 2305  ceFAZolin (ANCEF) IVPB 2g/100 mL premix        2 g 200 mL/hr over 30 Minutes Intravenous 30 min pre-op 08/31/20 2305 09/01/20 1333   08/29/20 2200  vancomycin (VANCOREADY) IVPB 1250 mg/250 mL  Status:  Discontinued        1,250 mg 166.7 mL/hr over 90 Minutes Intravenous Every 24 hours 08/29/20 1359 08/31/20 0723   08/29/20  1400  vancomycin (VANCOREADY) IVPB 750 mg/150 mL  Status:  Discontinued        750 mg 150 mL/hr over 60 Minutes Intravenous Every 12 hours 08/29/20 0208 08/29/20 1359   08/29/20 0130  metroNIDAZOLE (FLAGYL) IVPB 500 mg  Status:  Discontinued        500 mg 100 mL/hr over 60 Minutes Intravenous Every 8 hours 08/29/20 0022 09/01/20 1521   08/29/20 0100  cefTRIAXone (ROCEPHIN) 2 g in sodium chloride 0.9 % 100 mL IVPB  Status:  Discontinued        2 g 200 mL/hr over 30 Minutes Intravenous Every 24 hours 08/29/20 0022 09/01/20 1521   08/28/20 2330  vancomycin (VANCOREADY) IVPB 1250 mg/250 mL        1,250 mg 166.7 mL/hr over 90 Minutes Intravenous  Once 08/28/20 2315 08/29/20 0330   08/28/20 2330  piperacillin-tazobactam (ZOSYN) IVPB 3.375 g        3.375 g 100 mL/hr over 30 Minutes Intravenous  Once 08/28/20 2315 08/28/20 2359        V. Charlena Cross, M.D., [facility] [employer] Office: [phone] Pager:  [phone]

## 2020-09-06 NOTE — Progress Notes (Signed)
I have reviewed the patient's CT angiogram.  It has not been formally read by radiology.  Unfortunately, I cannot access the images to her recent angiogram for comparison.  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  I see no evidence of distal embolic spread down the right leg.  She has patent vasculature in the right leg without stenosis all the way down to her ankle.  At this level, I see no contrast going out onto the foot, which is consistent with her physical exam as well as what is described in her arteriogram.  She will likely need to have the aortic thrombus addressed either via mechanical thrombectomy or open surgical thrombectomy.  However, this will not have a significant impact on her foot.  Potentially, I could start a tPA infusion to see if she gets any improvement in her foot, however without addressing the aortic thrombus, there is the risk for embolic spread of this aortic thrombus.  I believe that she is at high risk for limb loss.  I have started her on heparin.  I would like to see how she does overnight with heparin before making any definitive surgical plans.  Alexandra [name]

## 2020-09-06 NOTE — Progress Notes (Signed)
ANTICOAGULATION CONSULT NOTE  Pharmacy Consult for heparin infusion Indication:  VTE treatment  No Known Allergies  Patient Measurements: Height: 5\' 3"  (160 cm) Weight: 58.5 kg (129 lb) IBW/kg (Calculated) : 52.4  Vital Signs: Temp: 98.4 F (36.9 C) (06/25 1141) Temp Source: Oral (06/25 0434) BP: 121/57 (06/25 1141) Pulse Rate: 58 (06/25 1141)  Labs: Recent Labs    09/04/20 0436 09/05/20 0553 09/06/20 0512  HGB 10.2* 10.2* 10.8*  HCT 32.3* 32.7* 34.0*  PLT 470* 488* 401*  CREATININE 0.46 0.58 0.46    Estimated Creatinine Clearance: 68.8 mL/min (by C-G formula based on SCr of 0.46 mg/dL).   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 She was hospitalized 1 month ago and was discharged home on doxycycline which she completed without any significant improvement.  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 Pt underwent angiogram 09/01/20 without intervention. Pharmacy has been consulted for heparin dosing and monitoring. Pt might get repeat angiogram Monday (09/08/20).  Goal of Therapy:  Heparin level 0.3-0.7 units/ml Monitor platelets by anticoagulation protocol: Yes   Plan:  Give 4000 units bolus x 1 Start heparin infusion at 1000 units/hr Check anti-Xa level in 6 hours and daily while on heparin Continue to  monitor H&H and platelets  09/10/20, PharmD 09/06/2020,2:22 PM

## 2020-09-06 NOTE — Progress Notes (Signed)
PROGRESS NOTE    Alexandra [name]  NWG:[id number] DOB: [birth year] DOA: 08/28/2020 PCP: [facility]    Chief Complaint  Patient presents with   Wound Check    Brief Narrative:  HPI was taken from Dr. Joylene Igo: Alexandra [name] is a 50 y.o. 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 Patient states that she has had this ulcer for 3 months, it is nonhealing and appears to be extending to the anterior surface of her right leg.  She rates her pain a 10 x 10 in intensity at its worst and pain is usually at rest.  When she is ambulating or standing on her feet she does not feel any pain. She has increased purulent drainage from the wound but denies having any fever or chills.  She has felt unwell and complains of nausea without any emesis or abdominal pain. She was hospitalized 1 month ago and was discharged home on doxycycline which she completed without any significant improvement.  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  Patient was referred to rheumatology and has an appointment scheduled for July 7. She denies having any chest pain,  no headache, no cough, no abdominal pain, no changes in her bowel habits, no dizziness, no lightheadedness no urinary symptoms or changes in her bowel habits. Labs showed sodium 138, potassium 3.5, chloride 105, bicarb 25, glucose 88, BUN 15, creatinine 0.75, calcium 8.7, alkaline phosphatase 85, albumin 3.3, AST 16, ALT 11, total protein 7.3, lactic acid 1.4, white count 9.5, hemoglobin 10.7, hematocrit 35.0, MCV 77.6, RDW 18.2, platelet count  499, PT 13.9, INR 1.1 Respiratory viral panel is negative Right ankle x-ray shows diffuse soft tissue swelling or more focal ulceration over the lateral malleolus and distal fibular diaphysis where some early periosteal reaction is suggested. May implicate reactive periostitis or early osteomyelitis. Chest x-ray reviewed by me shows coarse reticular and patchy opacities in the lung bases favor superimposed edematous change given vascular congestion, septal thickening and cuffing. Underlying infection is difficult to fully exclude. Low volumes with additional features of atelectasis. Twelve-lead EKG reviewed by me shows sinus rhythm.       ED Course: Patient is a 49 year old female who presents to the ER for evaluation of pain and increased purulent drainage from a chronic ulcer over her right ankle. Imaging shows early periosteal reaction which could be reactive periostitis versus early osteomyelitis. She will be admitted to the hospital for further evaluation.     Hospital course from Dr. Mayford Knife 6/17-6/21/22: Pt was found to have nonhealing right lateral malleolus ulcer & possible cellulitis. Pt has a positive RF and P-ANCA and there is a concern for vasculitis. Rheumatology and ID are following the pt. Other serologies were sent as per rheum (see rheum's note). Pt was started on IV steroids x 3 days and CT chest ordered to further evaluate reticular nodular lesions in the lungs was ordered as per rheum.Of note, pt is now on po augmentin as per ID and podiatry no longer recommends f/u w/ them outpatient for possible debridement but to f/u w/ a wound care center and consider hyperbaric oxygen to help w/ wound healing.  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  Chest x-ray, CT chest with interstitial lung disease. -Concern for possible Buerger's disease with history of smoking. -Right lower extremity ultrasound negative for DVT. -Patient was seen in consultation by podiatry who no longer recommends follow-up with them in the outpatient setting but to follow-up with the wound care center and consider hyperbaric oxygen to help with wound healing. -Patient seen by vascular surgery, underwent angiogram without intervention with normal common femoral artery, profunda femoris artery was small but normal, superficial femoral artery and popliteal arteries normal.  Typical tibial trifurcation with three-vessel runoff although all 3 vessels were small with poor flow into the foot and small vessel disease in the foot and ankle.  -Patient being followed by ID and cultures with abundant MRSA within mixed organisms and stenotrophomonas.  -Patient was on Augmentin and transitioned to Bactrim DS to complete a 7-day course of treatment.  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 -Hematology consulted and case discussed with rheumatology and hematology via secure chat and patient seen in consultation as well by Dr. Orlie DakinFinnegan and patient status post infusion of rituximab 09/05/2020 with recommendations from rheumatology to repeat dosing in 2 weeks as outpatient which will be arranged for September 19, 2020.  -In discussion with ID and hematology given the COVID-vaccine just prior to rituximab would not be helpful nor effective per ID and options are Evushield after rituximab and vaccine later on. -Supportive care -Patient now with right foot pain with absent dorsalis pedis pulses  with concern for ischemic foot.  Vascular surgery reconsulted and assessed the patient.  See #7. -Appreciate rheumatology, hematology, ID, vascular input and recommendations. -Follow.  2.  Depression -Stable. -It is noted that patient stopped taking her home dose of bupropion, Cymbalta, ariprazole. -Continue Celexa.   -Outpatient follow-up.   3.  Nicotine dependence -Cessation stressed to patient and patient seems motivated to quit.  -Nicotine patch.    4.  Iron deficiency anemia -Continue oral iron supplementation.   -H&H stable. -May benefit from IV iron in the future which may be done in the outpatient setting.  5.  Thrombocytosis -Likely reactive. -Trending down.  6.  Diarrhea -Patient with complaints of combination of watery and mushy stools with multiple bouts which she feels might be antibiotic related.  -Patient still with loose stools despite scheduled Imodium.  -Place on Questran twice daily.  -Lomotil as needed.   7.  Right foot pain/concern for ischemic foot -Patient with complaints of severe right foot pain starting around 5 AM and describes the pain as a burning pain similar to like a frostbite.  Unable to palpate dorsalis pedis pulses by hand and with a Doppler. -Concern for ischemic foot.  Consult with vascular surgery. -Spoke with Dr. Myra GianottiBrabham who went and assessed patient and could feel pulses above the ankle/wound area but no DP pulses and concern about possible ischemic foot. -Dr. Myra GianottiBrabham recommending heparin bolus and full dose heparin for now and patient may need repeat angiogram on Monday. -Check a fasting lipid panel.  Place empirically on Lipitor. -Pain management.   DVT prophylaxis: Heparin Code Status: Full Family Communication: Updated patient.  No family at bedside. Disposition:   Status is: Inpatient  Remains inpatient appropriate because:IV treatments appropriate due to intensity of illness or inability to take PO  Dispo: The patient is  from: Home              Anticipated d/c is to: Home              Patient currently is not medically stable to d/c.   Difficult to place patient No       Consultants:  Vascular surgery: Dr. Wyn Quaker 08/29/2020 Podiatry: Dr. Allena Katz 08/30/2020 Infectious disease: Dr. Avon Gully 09/01/2020 Rheumatology: Dr. Gavin Potters 09/01/2020 Hematology/oncology: Dr. Orlie Dakin Vascular surgery: Dr. Myra Gianotti 09/06/2020    Procedures:  CT chest 09/02/2020 Chest x-ray 08/28/2020 Plain films of the right ankle 08/28/2020 Lower extremity Doppler 08/29/2020 Ultrasound guidance for vascular access left femoral artery/catheter placement into right SFA from left femoral approach/aortogram and selective right lower extremity angiogram/StarClose closure device left femoral artery per Dr. Wyn Quaker 09/01/2020   Antimicrobials:  Augmentin 09/01/2020>>>>> 09/03/2020 Bactrim DS 09/03/2020>>>> IV Rocephin 08/29/2020>>>> 09/01/2020 IV Flagyl 08/29/2020>>>> 09/01/2020 IV vancomycin 08/29/2020>>>> 08/31/2020    Subjective: Patient complaining of severe burning pain in the right foot that started around 5 AM today not relieved by Percocet or Toradol or 0.5 mg of IV Dilaudid.  Patient states pain feels like a frostbite. Unable to palpate pulses.  Objective: Vitals:   09/05/20 2348 09/06/20 0434 09/06/20 0729 09/06/20 1141  BP: (!) 105/55 114/76 (!) 134/58 (!) 121/57  Pulse: 67 (!) 59 (!) 57 (!) 58  Resp: Temp: 98.6 F (37 C) (!) 100.8 F (38.2 C) 98.2 F (36.8 C) 98.4 F (36.9 C)  TempSrc: Oral Oral    SpO2: 96% 98% 100% 98%  Weight:      Height:        Intake/Output Summary (Last 24 hours) at 09/06/2020 1409 Last data filed at 09/05/2020 2200 Gross per 24 hour  Intake 936.68 ml  Output --  Net 936.68 ml   Filed Weights   08/28/20 1927 09/01/20 0937  Weight: 58.5 kg 58.5 kg    Examination:  General exam: NAD Respiratory system: Some diffuse fine crackles noted in the bases.  No wheezing.  No rhonchi.   Normal respiratory effort. Cardiovascular system: Regular rate and rhythm no murmurs rubs or gallops.  No JVD.  No lower extremity edema. Gastrointestinal system: Abdomen is soft, nontender, nondistended, positive bowel sounds.  No rebound.  No guarding.  Central nervous system: Alert and oriented.  No focal neurological deficits.   Extremities: Symmetric 5 x 5 power.  Right foot cool to touch.  Unable to feel for DP pulses. Skin: Large ulcer noted over right malleolus with gauze dressing around it.  Psychiatry: Judgement and insight appear normal. Mood & affect appropriate.     Data Reviewed: I have personally reviewed following labs and imaging studies  CBC: Recent Labs  Lab 09/02/20 0432 09/03/20 0533 09/04/20 0436 09/05/20 0553 09/06/20 0512  WBC 5.5 13.5* 14.2* 12.7* 6.5  NEUTROABS  --   --  12.5* 9.3* 5.5  HGB 11.4* 10.9* 10.2* 10.2* 10.8*  HCT 36.1 35.1* 32.3* 32.7* 34.0*  MCV 75.2* 76.3* 76.5* 75.5* 75.6*  PLT 537* 542* 470* 488* 401*    Basic Metabolic Panel: Recent Labs  Lab 09/02/20 0432 09/03/20 0533 09/04/20 0436 09/05/20 0553 09/06/20 0512  NA  139 140 138 137 136  K 4.0 3.9 4.0 4.3 3.9  CL 106 107 107 105 102  CO2 25 24 24 26 27   GLUCOSE 160* 137* 125* 107* 121*  BUN 17 18 21* 25* 13  CREATININE 0.45 0.41* 0.46 0.58 0.46  CALCIUM 9.0 8.4* 8.3* 8.4* 8.1*  MG  --   --  2.3  --  2.2    GFR: Estimated Creatinine Clearance: 68.8 mL/min (by C-G formula based on SCr of 0.46 mg/dL).  Liver Function Tests: No results for input(s): AST, ALT, ALKPHOS, BILITOT, PROT, ALBUMIN in the last 168 hours.   CBG: No results for input(s): GLUCAP in the last 168 hours.   Recent Results (from the past 240 hour(s))  Culture, blood (single) w Reflex to ID Panel     Status: None   Collection Time: 08/28/20 10:36 PM   Specimen: BLOOD  Result Value Ref Range Status   Specimen Description BLOOD RIGHT ANTECUBITAL  Final   Special Requests   Final    BOTTLES DRAWN  AEROBIC AND ANAEROBIC Blood Culture adequate volume   Culture   Final    NO GROWTH 5 DAYS Performed at [facility], [address] [place], [place]    Report Status 09/02/2020 FINAL  Final  Resp Panel by RT-PCR (Flu A&B, Covid) Nasopharyngeal Swab     Status: None   Collection Time: 08/28/20 10:37 PM   Specimen: Nasopharyngeal Swab; Nasopharyngeal(NP) swabs in vial transport medium  Result Value Ref Range Status   SARS Coronavirus 2 by RT PCR NEGATIVE NEGATIVE Final    Comment: (NOTE) SARS-CoV-2 target nucleic acids are NOT DETECTED.  The SARS-CoV-2 RNA is generally detectable in upper respiratory specimens during the acute phase of infection. The lowest concentration of SARS-CoV-2 viral copies this assay can detect is 138 copies/mL. A negative result does not preclude SARS-Cov-2 infection and should not be used as the sole basis for treatment or other patient management decisions. A negative result may occur with  improper specimen collection/handling, submission of specimen other than nasopharyngeal swab, presence of viral mutation(s) within the areas targeted by this assay, and inadequate number of viral copies(<138 copies/mL). A negative result must be combined with clinical observations, patient history, and epidemiological information. The expected result is Negative.  Fact Sheet for Patients:  08/30/20  Fact Sheet for Healthcare Providers:  BloggerCourse.com  This test is no t yet approved or cleared by the [employer] FDA and  has been authorized for detection and/or diagnosis of SARS-CoV-2 by FDA under an Emergency Use Authorization (EUA). This EUA will remain  in effect (meaning this test can be used) for the duration of the COVID-19 declaration under Section 564(b)(1) of the Act, 21 U.S.C.section 360bbb-3(b)(1), unless the authorization is terminated  or revoked sooner.        Influenza A by PCR NEGATIVE NEGATIVE Final   Influenza B by PCR NEGATIVE NEGATIVE Final    Comment: (NOTE) The Xpert Xpress SARS-CoV-2/FLU/RSV plus assay is intended as an aid in the diagnosis of influenza from Nasopharyngeal swab specimens and should not be used as a sole basis for treatment. Nasal washings and aspirates are unacceptable for Xpert Xpress SARS-CoV-2/FLU/RSV testing.  Fact Sheet for Patients: [place]  Fact Sheet for Healthcare Providers: BloggerCourse.com  This test is not yet approved or cleared by the [employer]ious[employer]ker.it FDA and has been authorized for detection and/or diagnosis of SARS-CoV-2 by FDA under an Emergency Use Authorization (EUA). This EUA will remain in  effect (meaning this test can be used) for the duration of the COVID-19 declaration under Section 564(b)(1) of the Act, 21 U.S.C. section 360bbb-3(b)(1), unless the authorization is terminated or revoked.  Performed at [facility], [address] [place], [place] [address]   Blood culture (routine single)     Status: None   Collection Time: 08/28/20 10:39 PM   Specimen: BLOOD  Result Value Ref Range Status   Specimen Description BLOOD LEFT ANTECUBITAL  Final   Special Requests   Final    BOTTLES DRAWN AEROBIC AND ANAEROBIC Blood Culture adequate volume   Culture   Final    NO GROWTH 5 DAYS Performed at [facility], [address] [place], [place] [address]    Report Status 09/02/2020 FINAL  Final  MRSA PCR Screening     Status: None   Collection Time: 08/30/20  1:32 PM  Result Value Ref Range Status   MRSA by PCR NEGATIVE NEGATIVE Final    Comment:        The GeneXpert MRSA Assay (FDA approved for NASAL specimens only), is one component of a comprehensive MRSA colonization surveillance program. It is not intended to diagnose MRSA infection nor to guide or monitor treatment for MRSA infections. Performed at  [facility], [address] [place], [place] [address]   Urine culture     Status: None   Collection Time: 09/01/20  9:15 AM   Specimen: In/Out Cath Urine  Result Value Ref Range Status   Specimen Description   Final    IN/OUT CATH URINE Performed at [facility], [address] [place], [place] [address]    Special Requests   Final    NONE Performed at [facility], [address] [place], [place] [address]    Culture   Final    NO GROWTH Performed at [facility], [address], [place], [place] [address]    Report Status 09/02/2020 FINAL  Final  Aerobic Culture w Gram Stain (superficial specimen)     Status: None   Collection Time: 09/01/20  2:40 PM   Specimen: Wound  Result Value Ref Range Status   Specimen Description   Final    WOUND Performed at [facility], [address], [place], [place] [address]    Special Requests   Final    NONE Performed at [facility], [address] [place], [place] [address]    Gram Stain   Final    RARE WBC PRESENT, PREDOMINANTLY PMN FEW GRAM NEGATIVE RODS FEW GRAM POSITIVE COCCI IN CLUSTERS    Culture   Final    ABUNDANT METHICILLIN RESISTANT STAPHYLOCOCCUS AUREUS WITHIN MIXED ORGANISMS Performed at [facility], [address], [place], [place] [address]    Report Status 09/04/2020 FINAL  Final   Organism ID, Bacteria METHICILLIN RESISTANT STAPHYLOCOCCUS AUREUS  Final      Susceptibility   Methicillin resistant staphylococcus aureus - MIC*    CIPROFLOXACIN <=0.5 SENSITIVE Sensitive     ERYTHROMYCIN <=0.25 SENSITIVE Sensitive     GENTAMICIN <=0.5 SENSITIVE Sensitive     OXACILLIN >=4 RESISTANT Resistant     TETRACYCLINE >=16 RESISTANT Resistant     VANCOMYCIN <=0.5 SENSITIVE Sensitive     TRIMETH/SULFA <=10 SENSITIVE Sensitive     CLINDAMYCIN <=0.25 SENSITIVE Sensitive     RIFAMPIN <=0.5 SENSITIVE Sensitive     Inducible Clindamycin NEGATIVE Sensitive     *  ABUNDANT METHICILLIN RESISTANT STAPHYLOCOCCUS  AUREUS          Radiology Studies: No results found.      Scheduled Meds:  vitamin C  250 mg Oral BID   atorvastatin  40 mg Oral Daily   cholecalciferol  1,000 Units Oral Daily   cholestyramine light  4 g Oral BID   citalopram  20 mg Oral Daily   enoxaparin (LOVENOX) injection  40 mg Subcutaneous Q24H   feeding supplement  1 Container Oral TID BM   ferrous gluconate  324 mg Oral BID WC   gabapentin  400 mg Oral TID   loperamide  4 mg Oral TID   nicotine  21 mg Transdermal Daily   nutrition supplement (JUVEN)  1 packet Oral BID BM   pantoprazole  40 mg Oral Daily   sodium chloride flush  3 mL Intravenous Q12H   sulfamethoxazole-trimethoprim  1 tablet Oral Q12H   Continuous Infusions:  sodium chloride Stopped (08/29/20 1709)     LOS: 9 days    Time spent: 40 minutes    Ramiro Harvest, MD [facility]   To contact the attending provider between 7A-7P or the covering provider during after hours 7P-7A, please log into the web site [email or web address] and access using universal [place] password for that web site. If you do not have the password, please call the hospital operator.  09/06/2020, 2:09 PM

## 2020-09-06 NOTE — Progress Notes (Signed)
ANTICOAGULATION CONSULT NOTE  Pharmacy Consult for heparin infusion Indication:  VTE treatment  No Known Allergies  Patient Measurements: Height: 5\' 3"  (160 cm) Weight: 58.5 kg (129 lb) IBW/kg (Calculated) : 52.4  Vital Signs: Temp: 98.3 F (36.8 C) (06/25 2006) Temp Source: Oral (06/25 1541) BP: 126/68 (06/25 2006) Pulse Rate: 79 (06/25 2006)  Labs: Recent Labs    09/04/20 0436 09/05/20 0553 09/06/20 0512 09/06/20 2039  HGB 10.2* 10.2* 10.8*  --   HCT 32.3* 32.7* 34.0*  --   PLT 470* 488* 401*  --   HEPARINUNFRC  --   --   --  0.78*  CREATININE 0.46 0.58 0.46  --      Estimated Creatinine Clearance: 68.8 mL/min (by C-G formula based on SCr of 0.46 mg/dL).   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 She was hospitalized 1 month ago and was discharged home on doxycycline which she completed without any significant improvement.  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 Pt underwent angiogram 09/01/20 without intervention. Pharmacy has been consulted for heparin dosing and monitoring. Pt might get repeat angiogram Monday (09/08/20).  6/25 2039 HL = 0.78  Goal of Therapy:  Heparin level 0.3-0.7 units/ml Monitor platelets by anticoagulation protocol: Yes   Plan:  Heparin supratherapeutic.  Reduce heparin  infusion to 900 units/hr Check anti-Xa level in 6 hours following rate change Continue to monitor H&H and platelets  2040, PharmD, BCPS 09/06/2020,9:10 PM

## 2020-09-07 ENCOUNTER — Encounter
Admission: EM | Disposition: A | Discharge status: Home Health | Payer: Self-pay | Source: Home / Self Care | Attending: Internal Medicine

## 2020-09-07 DIAGNOSIS — I998 Other disorder of circulatory system: Secondary | ICD-10-CM

## 2020-09-07 DIAGNOSIS — M79671 Pain in right foot: Secondary | ICD-10-CM | POA: Clinically undetermined

## 2020-09-07 DIAGNOSIS — Z86718 Personal history of other venous thrombosis and embolism: Secondary | ICD-10-CM | POA: Clinically undetermined

## 2020-09-07 DIAGNOSIS — I7410 Embolism and thrombosis of unspecified parts of aorta: Secondary | ICD-10-CM | POA: Clinically undetermined

## 2020-09-07 DIAGNOSIS — I70233 Atherosclerosis of native arteries of right leg with ulceration of ankle: Secondary | ICD-10-CM

## 2020-09-07 HISTORY — PX: LOWER EXTREMITY INTERVENTION: CATH118252

## 2020-09-07 LAB — CBC
HCT: 35.5 % — ABNORMAL LOW (ref 36.0–46.0)
HCT: 36.9 % (ref 36.0–46.0)
Hemoglobin: 11.1 g/dL — ABNORMAL LOW (ref 12.0–15.0)
Hemoglobin: 11.6 g/dL — ABNORMAL LOW (ref 12.0–15.0)
MCH: 23.8 pg — ABNORMAL LOW (ref 26.0–34.0)
MCH: 24 pg — ABNORMAL LOW (ref 26.0–34.0)
MCHC: 31.3 g/dL (ref 30.0–36.0)
MCHC: 31.4 g/dL (ref 30.0–36.0)
MCV: 76 fL — ABNORMAL LOW (ref 80.0–100.0)
MCV: 76.2 fL — ABNORMAL LOW (ref 80.0–100.0)
Platelets: 355 10*3/uL (ref 150–400)
Platelets: 348 10*3/uL (ref 150–400)
RBC: 4.67 MIL/uL (ref 3.87–5.11)
RBC: 4.84 MIL/uL (ref 3.87–5.11)
RDW: 18.7 % — ABNORMAL HIGH (ref 11.5–15.5)
RDW: 18.8 % — ABNORMAL HIGH (ref 11.5–15.5)
WBC: 12.2 10*3/uL — ABNORMAL HIGH (ref 4.0–10.5)
WBC: 14.7 10*3/uL — ABNORMAL HIGH (ref 4.0–10.5)
nRBC: 0 % (ref 0.0–0.2)
nRBC: 0 % (ref 0.0–0.2)

## 2020-09-07 LAB — CBC WITH DIFFERENTIAL/PLATELET
Abs Immature Granulocytes: 0.08 10*3/uL — ABNORMAL HIGH (ref 0.00–0.07)
Basophils Absolute: 0 10*3/uL (ref 0.0–0.1)
Basophils Relative: 0 %
Eosinophils Absolute: 0.4 10*3/uL (ref 0.0–0.5)
Eosinophils Relative: 3 %
HCT: 36.3 % (ref 36.0–46.0)
Hemoglobin: 11.6 g/dL — ABNORMAL LOW (ref 12.0–15.0)
Immature Granulocytes: 1 %
Lymphocytes Relative: 20 %
Lymphs Abs: 3.1 10*3/uL (ref 0.7–4.0)
MCH: 24 pg — ABNORMAL LOW (ref 26.0–34.0)
MCHC: 32 g/dL (ref 30.0–36.0)
MCV: 75 fL — ABNORMAL LOW (ref 80.0–100.0)
Monocytes Absolute: 1.2 10*3/uL — ABNORMAL HIGH (ref 0.1–1.0)
Monocytes Relative: 8 %
Neutro Abs: 10.6 10*3/uL — ABNORMAL HIGH (ref 1.7–7.7)
Neutrophils Relative %: 68 %
Platelets: 397 10*3/uL (ref 150–400)
RBC: 4.84 MIL/uL (ref 3.87–5.11)
RDW: 18.5 % — ABNORMAL HIGH (ref 11.5–15.5)
WBC: 15.4 10*3/uL — ABNORMAL HIGH (ref 4.0–10.5)
nRBC: 0 % (ref 0.0–0.2)

## 2020-09-07 LAB — GLUCOSE, CAPILLARY: Glucose-Capillary: 143 mg/dL — ABNORMAL HIGH (ref 70–99)

## 2020-09-07 LAB — QUANTIFERON-TB GOLD PLUS (RQFGPL)
QuantiFERON Mitogen Value: 0.04 IU/mL
QuantiFERON Nil Value: 0.01 IU/mL
QuantiFERON TB1 Ag Value: 0 IU/mL
QuantiFERON TB2 Ag Value: 0.01 IU/mL

## 2020-09-07 LAB — LIPID PANEL
Cholesterol: 183 mg/dL (ref 0–200)
HDL: 36 mg/dL — ABNORMAL LOW (ref >40)
LDL Cholesterol: 118 mg/dL — ABNORMAL HIGH (ref 0–99)
Total CHOL/HDL Ratio: 5.1 RATIO
Triglycerides: 143 mg/dL (ref <150)
VLDL: 29 mg/dL (ref 0–40)

## 2020-09-07 LAB — BASIC METABOLIC PANEL
Anion gap: 11 (ref 5–15)
BUN: 20 mg/dL (ref 6–20)
CO2: 25 mmol/L (ref 22–32)
Calcium: 8 mg/dL — ABNORMAL LOW (ref 8.9–10.3)
Chloride: 99 mmol/L (ref 98–111)
Creatinine, Ser: 0.64 mg/dL (ref 0.44–1.00)
GFR, Estimated: >60 mL/min (ref >60)
Glucose, Bld: 93 mg/dL (ref 70–99)
Potassium: 3.4 mmol/L — ABNORMAL LOW (ref 3.5–5.1)
Sodium: 135 mmol/L (ref 135–145)

## 2020-09-07 LAB — FIBRINOGEN
Fibrinogen: 370 mg/dL (ref 210–475)
Fibrinogen: 390 mg/dL (ref 210–475)

## 2020-09-07 LAB — HEPARIN LEVEL (UNFRACTIONATED)
Heparin Unfractionated: 0.33 IU/mL (ref 0.30–0.70)
Heparin Unfractionated: 0.2 IU/mL — ABNORMAL LOW (ref 0.30–0.70)
Heparin Unfractionated: 0.13 IU/mL — ABNORMAL LOW (ref 0.30–0.70)
Heparin Unfractionated: 0.2 IU/mL — ABNORMAL LOW (ref 0.30–0.70)

## 2020-09-07 LAB — CRYOGLOBULIN

## 2020-09-07 LAB — QUANTIFERON-TB GOLD PLUS: QuantiFERON-TB Gold Plus: UNDETERMINED — AB

## 2020-09-07 SURGERY — LOWER EXTREMITY INTERVENTION
Anesthesia: Moderate Sedation | Laterality: Right

## 2020-09-07 MED ORDER — HEPARIN (PORCINE) 25000 UT/250ML-% IV SOLN
900.0000 [IU]/h | INTRAVENOUS | Status: DC
  Filled 2020-09-07: qty 250

## 2020-09-07 MED ORDER — SODIUM CHLORIDE 0.9 % IV BOLUS
500.0000 mL | Freq: Once | INTRAVENOUS | Status: AC
  Administered 2020-09-07: 500 mL via INTRAVENOUS

## 2020-09-07 MED ORDER — HEPARIN BOLUS VIA INFUSION
800.0000 [IU] | Freq: Once | INTRAVENOUS | Status: AC
  Administered 2020-09-07: 800 [IU] via INTRAVENOUS
  Filled 2020-09-07: qty 800

## 2020-09-07 MED ORDER — MIDAZOLAM HCL 2 MG/2ML IJ SOLN: 1.0000 mg | INTRAMUSCULAR | Status: DC | PRN

## 2020-09-07 MED ORDER — FENTANYL CITRATE (PF) 100 MCG/2ML IJ SOLN
INTRAMUSCULAR | Status: AC
  Administered 2020-09-08: 25 ug via INTRAVENOUS
  Filled 2020-09-07: qty 2

## 2020-09-07 MED ORDER — MIDAZOLAM HCL 5 MG/5ML IJ SOLN
INTRAMUSCULAR | Status: AC
  Filled 2020-09-07: qty 5

## 2020-09-07 MED ORDER — CHLORHEXIDINE GLUCONATE CLOTH 2 % EX PADS
6.0000 | MEDICATED_PAD | Freq: Every day | CUTANEOUS | Status: DC
  Administered 2020-09-07 – 2020-09-16 (×5): 6 via TOPICAL

## 2020-09-07 MED ORDER — CEFAZOLIN SODIUM-DEXTROSE 2-4 GM/100ML-% IV SOLN
INTRAVENOUS | Status: AC
  Filled 2020-09-07: qty 100

## 2020-09-07 MED ORDER — FENTANYL CITRATE (PF) 100 MCG/2ML IJ SOLN
INTRAMUSCULAR | Status: DC | PRN
  Administered 2020-09-07: 25 ug via INTRAVENOUS
  Administered 2020-09-07: 50 ug via INTRAVENOUS

## 2020-09-07 MED ORDER — ONDANSETRON HCL 4 MG/2ML IJ SOLN: 4.0000 mg | Freq: Four times a day (QID) | INTRAMUSCULAR | Status: DC | PRN

## 2020-09-07 MED ORDER — SODIUM CHLORIDE 0.9 % IV SOLN: 250.0000 mL | INTRAVENOUS | Status: DC | PRN

## 2020-09-07 MED ORDER — POTASSIUM CHLORIDE CRYS ER 20 MEQ PO TBCR
40.0000 meq | EXTENDED_RELEASE_TABLET | Freq: Once | ORAL | Status: AC
  Administered 2020-09-07: 40 meq via ORAL
  Filled 2020-09-07: qty 2

## 2020-09-07 MED ORDER — SODIUM CHLORIDE 0.9 % IV SOLN
1.0000 mg/h | INTRAVENOUS | Status: DC
  Administered 2020-09-07 – 2020-09-08 (×3): 1 mg/h
  Filled 2020-09-07 (×6): qty 10

## 2020-09-07 MED ORDER — MORPHINE SULFATE (PF) 4 MG/ML IV SOLN
5.0000 mg | INTRAVENOUS | Status: DC | PRN
  Administered 2020-09-07 – 2020-09-10 (×6): 5 mg via INTRAVENOUS
  Filled 2020-09-07 (×6): qty 2

## 2020-09-07 MED ORDER — HEPARIN (PORCINE) 25000 UT/250ML-% IV SOLN
INTRAVENOUS | Status: AC
  Filled 2020-09-07: qty 250

## 2020-09-07 MED ORDER — HEPARIN SODIUM (PORCINE) 1000 UNIT/ML IJ SOLN
INTRAMUSCULAR | Status: AC
  Filled 2020-09-07: qty 1

## 2020-09-07 MED ORDER — ALTEPLASE 2 MG IJ SOLR
INTRAMUSCULAR | Status: AC
  Filled 2020-09-07: qty 8

## 2020-09-07 MED ORDER — MIDAZOLAM HCL 2 MG/2ML IJ SOLN
INTRAMUSCULAR | Status: DC | PRN
  Administered 2020-09-07 (×3): 1 mg via INTRAVENOUS

## 2020-09-07 MED ORDER — SODIUM CHLORIDE 0.9% FLUSH: 3.0000 mL | Freq: Two times a day (BID) | INTRAVENOUS | Status: DC

## 2020-09-07 MED ORDER — SODIUM CHLORIDE 0.9% FLUSH: 3.0000 mL | INTRAVENOUS | Status: DC | PRN

## 2020-09-07 SURGICAL SUPPLY — 10 items
CANNULA 5F STIFF (CANNULA) ×2 IMPLANT
CATH ANGIO 5F PIGTAIL 65CM (CATHETERS) ×2 IMPLANT
CATH INFUS 90CMX10CM (CATHETERS) ×2 IMPLANT
CATH KUMPE SOFT-VU 5FR 65 (CATHETERS) ×2 IMPLANT
COVER PROBE U/S 5X48 (MISCELLANEOUS) ×2 IMPLANT
PACK ANGIOGRAPHY (CUSTOM PROCEDURE TRAY) ×2 IMPLANT
SHEATH BRITE TIP 5FRX11 (SHEATH) ×2 IMPLANT
SHEATH DESTIN RDC 6FR 45 (SHEATH) ×2 IMPLANT
SUT PROLENE 0 CT 1 30 (SUTURE) ×2 IMPLANT
WIRE GUIDERIGHT .035X150 (WIRE) ×2 IMPLANT

## 2020-09-07 NOTE — Plan of Care (Signed)
Patient complaining 8/10 pain in right foot - alternating ordered pain medication. Slight pedal pulse noted and patient is able to move great toe. Remains purple and cold to touch.

## 2020-09-07 NOTE — Consult Note (Signed)
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 Patient has had high dose steroid load (3.0 grams) and first of 2 doses of rituximab induction.  Would maintain on 60mg  prednisone which will be gradually lowered as outpatient. Unsure why lab from 6/20 (MPO/PR3 antibodies have not been reported.

## 2020-09-07 NOTE — Progress Notes (Signed)
PROGRESS NOTE    Alexandra [name]  NWG:[id number] DOB: [birth year] DOA: 08/28/2020 PCP: [facility]    Chief Complaint  Patient presents with   Wound Check    Brief Narrative:  HPI was taken from Alexandra [name]: Alexandra [name] is a 50 y.o. 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 Patient states that she has had this ulcer for 3 months, it is nonhealing and appears to be extending to the anterior surface of her right leg.  She rates her pain a 10 x 10 in intensity at its worst and pain is usually at rest.  When she is ambulating or standing on her feet she does not feel any pain. She has increased purulent drainage from the wound but denies having any fever or chills.  She has felt unwell and complains of nausea without any emesis or abdominal pain. She was hospitalized 1 month ago and was discharged home on doxycycline which she completed without any significant improvement.  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  Patient was referred to rheumatology and has an appointment scheduled for July 7. She denies having any chest pain,  no headache, no cough, no abdominal pain, no changes in her bowel habits, no dizziness, no lightheadedness no urinary symptoms or changes in her bowel habits. Labs showed sodium 138, potassium 3.5, chloride 105, bicarb 25, glucose 88, BUN 15, creatinine 0.75, calcium 8.7, alkaline phosphatase 85, albumin 3.3, AST 16, ALT 11, total protein 7.3, lactic acid 1.4, white count 9.5, hemoglobin 10.7, hematocrit 35.0, MCV 77.6, RDW 18.2, platelet count  499, PT 13.9, INR 1.1 Respiratory viral panel is negative Right ankle x-ray shows diffuse soft tissue swelling or more focal ulceration over the lateral malleolus and distal fibular diaphysis where some early periosteal reaction is suggested. May implicate reactive periostitis or early osteomyelitis. Chest x-ray reviewed by me shows coarse reticular and patchy opacities in the lung bases favor superimposed edematous change given vascular congestion, septal thickening and cuffing. Underlying infection is difficult to fully exclude. Low volumes with additional features of atelectasis. Twelve-lead EKG reviewed by me shows sinus rhythm.       ED Course: Patient is a 49 year old female who presents to the ER for evaluation of pain and increased purulent drainage from a chronic ulcer over her right ankle. Imaging shows early periosteal reaction which could be reactive periostitis versus early osteomyelitis. She will be admitted to the hospital for further evaluation.     Hospital course from Dr. Mayford Knife 6/17-6/21/22: Pt was found to have nonhealing right lateral malleolus ulcer & possible cellulitis. Pt has a positive RF and P-ANCA and there is a concern for vasculitis. Rheumatology and ID are following the pt. Other serologies were sent as per rheum (see rheum's note). Pt was started on IV steroids x 3 days and CT chest ordered to further evaluate reticular nodular lesions in the lungs was ordered as per rheum.Of note, pt is now on po augmentin as per ID and podiatry no longer recommends f/u w/ them outpatient for possible debridement but to f/u w/ a wound care center and consider hyperbaric oxygen to help w/ wound healing.  Assessment & Plan:   Principal Problem:   Osteomyelitis of ankle (HCC) Active Problems:   Tobacco use disorder   Depression   Iron deficiency anemia   Thrombocytosis   Foot pain, right   Aortic thrombus (HCC)   Ischemic pain of right foot: Concern  for/probable   1 nonhealing right lateral malleolus ulcer/cellulitis-P ANCA vasculitis -Patient noted to P ANCA positive.  Chest x-ray, CT chest with interstitial lung disease. -Concern for possible Buerger's disease with history of smoking. -Right lower extremity ultrasound negative for DVT. -Patient was seen in consultation by podiatry who no longer recommends follow-up with them in the outpatient setting but to follow-up with the wound care center and consider hyperbaric oxygen to help with wound healing. -Patient seen by vascular surgery, underwent angiogram without intervention with normal common femoral artery, profunda femoris artery was small but normal, superficial femoral artery and popliteal arteries normal.  Typical tibial trifurcation with three-vessel runoff although all 3 vessels were small with poor flow into the foot and small vessel disease in the foot and ankle.  -Patient being followed by ID and cultures with abundant MRSA within mixed organisms and stenotrophomonas.  -Patient was on Augmentin and transitioned to Bactrim DS to complete a 7-day course of treatment.  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 -Hematology consulted and case discussed with rheumatology and hematology via secure chat and patient seen in consultation as well by Dr. Orlie Dakin and patient status post infusion of rituximab 09/05/2020 with recommendations from rheumatology to repeat dosing in 2 weeks as outpatient which will be arranged for September 19, 2020.  -In discussion with ID and hematology given the COVID-vaccine just prior to rituximab would not be helpful nor effective per ID and options are Evushield after rituximab and vaccine later  on. -Supportive care -Patient now with right foot pain with absent dorsalis pedis pulses with concern for ischemic foot.  Vascular surgery reconsulted and assessed the patient.  See #7. -Appreciate rheumatology, hematology, ID, vascular input and recommendations. -Follow.  2.  Depression -Stable. -It is noted that patient stopped taking her home dose of bupropion, Cymbalta, ariprazole. -Continue Celexa.   -Outpatient follow-up.   3.  Nicotine dependence -Cessation stressed to patient and patient seems very motivated to quit.   -Continue nicotine patch.   4.  Iron deficiency anemia -Continue oral iron supplementation.   -H&H stable. -May benefit from IV iron in the future which may be done in the outpatient setting.  5.  Thrombocytosis -Likely reactive. -Trending down.  6.  Diarrhea -Patient with complaints of combination of watery and mushy stools with multiple bouts which she feels might be antibiotic related.  -Diarrhea improved with addition of Questran to scheduled Imodium.   -Lomotil as needed.    7.  Right foot pain/concern for ischemic foot -Patient with complaints of severe right foot pain starting around 5 AM on 09/06/2020, and describes the pain as a burning pain similar to like a frostbite.  Unable to palpate dorsalis pedis pulses by hand and with a Doppler. -Concern for ischemic foot.   -Patient seen in consultation by vascular surgery, Dr. Myra Gianotti who assessed patient and could feel pulses above the ankle/wound area but no DP pulses and concern about possible ischemic foot. -Dr. Myra Gianotti recommended heparin bolus and full dose heparin.  -  Patient underwent CT angiogram of abdominal aorta with iliofemoral runoff which showed irregular, pedunculated aortic plaque/thrombus of the lower infrarenal abdominal aorta, potential source of embolus, plaque/thrombus contributes to 50% cross-sectional diameter narrowing of the lower infrarenal abdominal aorta.  Aortic atherosclerosis.   Minimal bilateral iliac artery disease.  No significant femoropopliteal disease.  Absent contrast column within the distal right AT, PT and peroneal arteries just above the ankle, favored to represent distal embolization, however slow arrival of contrast bolus could have this appearance.   -Fasting lipid panel with LDL of 118  -Continue Lipitor, heparin, pain management  -Vascular surgery following and appreciate input and recommendations.  8.  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 -Continue IV heparin. -Per vascular surgery.   DVT prophylaxis: Heparin Code Status: Full Family Communication: Updated patient.  No family at bedside. Disposition:   Status is: Inpatient  Remains inpatient appropriate because:IV treatments appropriate due to intensity of illness or inability to take PO  Dispo: The patient is from: Home              Anticipated d/c is to: Home              Patient currently is not medically stable to d/c.   Difficult to place patient No       Consultants:  Vascular surgery: Dr. Wyn Quakerew 08/29/2020 Podiatry: Dr. Allena KatzPatel 08/30/2020 Infectious disease: Dr. Avon Gullyavishanker 09/01/2020 Rheumatology: Dr. Gavin PottersKernodle 09/01/2020 Hematology/oncology: Dr. Orlie DakinFinnegan Vascular surgery: Dr. Myra GianottiBrabham 09/06/2020    Procedures:  CT chest 09/02/2020 Chest x-ray 08/28/2020 Plain films of the right ankle 08/28/2020 Lower extremity Doppler 08/29/2020 Ultrasound guidance for vascular access left femoral artery/catheter placement into right SFA from left femoral approach/aortogram and selective right lower extremity angiogram/StarClose closure device left femoral artery per Dr. Wyn Quakerew 09/01/2020 CT angiogram of abdominal aorta and iliofemoral runoff 09/06/2020   Antimicrobials:  Augmentin 09/01/2020>>>>> 09/03/2020 Bactrim DS 09/03/2020>>>> IV Rocephin 08/29/2020>>>> 09/01/2020 IV Flagyl 08/29/2020>>>> 09/01/2020 IV vancomycin  08/29/2020>>>> 08/31/2020    Subjective: Patient tearful. States pain slightly improved, but foot bluish and able to only move great toe. No CP, no SOB. Diarrhea improved.   Objective: Vitals:   09/06/20 2006 09/07/20 0435 09/07/20 0737 09/07/20 1126  BP: 126/68 102/61 106/62 119/63  Pulse: 79 80 78 90  Resp: 16 16 16 16   Temp: 98.3 F (36.8 C) 98.4 F (36.9 C) 98.6 F (37 C) 98.6 F (37 C)  TempSrc:    Oral  SpO2: 94% 94% 98% 95%  Weight:      Height:        Intake/Output Summary (Last 24 hours) at 09/07/2020 1210 Last data filed at 09/07/2020 1019 Gross per 24 hour  Intake 524.01 ml  Output --  Net 524.01 ml   Filed Weights   08/28/20 1927 09/01/20 0937  Weight: 58.5 kg 58.5 kg    Examination:  General exam: NAD Respiratory system: Fine diffuse crackles in the bases.  No wheezing.  Fair air movement.  Speaking in full sentences.  Cardiovascular system: RRR no murmurs rubs or gallops.  No JVD.  No lower extremity edema . Gastrointestinal system: Abdomen is soft, nontender, nondistended, positive bowel sounds.  No rebound.  No guarding.   Central nervous system: Alert and oriented.  No focal neurological deficits.   Extremities: Symmetric 5 x 5 power.  Right foot with some bluish discoloration.  Cool to touch.  Unable to palpate DP pulses.  Only able to  move right great toe.   Skin: Large ulcer noted over right malleolus with gauze dressing around it.  Psychiatry: Judgement and insight appear normal. Mood & affect appropriate.     Data Reviewed: I have personally reviewed following labs and imaging studies  CBC: Recent Labs  Lab 09/03/20 0533 09/04/20 0436 09/05/20 0553 09/06/20 0512 09/07/20 0426  WBC 13.5* 14.2* 12.7* 6.5 15.4*  NEUTROABS  --  12.5* 9.3* 5.5 10.6*  HGB 10.9* 10.2* 10.2* 10.8* 11.6*  HCT 35.1* 32.3* 32.7* 34.0* 36.3  MCV 76.3* 76.5* 75.5* 75.6* 75.0*  PLT 542* 470* 488* 401* 397    Basic Metabolic Panel: Recent Labs  Lab  09/03/20 0533 09/04/20 0436 09/05/20 0553 09/06/20 0512 09/07/20 0426  NA 140 138 137 136 135  K 3.9 4.0 4.3 3.9 3.4*  CL 107 107 105 102 99  CO2 GLUCOSE 137* 125* 107* 121* 93  BUN 18 21* 25* 13 20  CREATININE 0.41* 0.46 0.58 0.46 0.64  CALCIUM 8.4* 8.3* 8.4* 8.1* 8.0*  MG  --  2.3  --  2.2  --     GFR: Estimated Creatinine Clearance: 68.8 mL/min (by C-G formula based on SCr of 0.64 mg/dL).  Liver Function Tests: No results for input(s): AST, ALT, ALKPHOS, BILITOT, PROT, ALBUMIN in the last 168 hours.   CBG: No results for input(s): GLUCAP in the last 168 hours.   Recent Results (from the past 240 hour(s))  Culture, blood (single) w Reflex to ID Panel     Status: None   Collection Time: 08/28/20 10:36 PM   Specimen: BLOOD  Result Value Ref Range Status   Specimen Description BLOOD RIGHT ANTECUBITAL  Final   Special Requests   Final    BOTTLES DRAWN AEROBIC AND ANAEROBIC Blood Culture adequate volume   Culture   Final    NO GROWTH 5 DAYS Performed at [facility], [address] [place], [place] [address]    Report Status 09/02/2020 FINAL  Final  Resp Panel by RT-PCR (Flu A&B, Covid) Nasopharyngeal Swab     Status: None   Collection Time: 08/28/20 10:37 PM   Specimen: Nasopharyngeal Swab; Nasopharyngeal(NP) swabs in vial transport medium  Result Value Ref Range Status   SARS Coronavirus 2 by RT PCR NEGATIVE NEGATIVE Final    Comment: (NOTE) SARS-CoV-2 target nucleic acids are NOT DETECTED.  The SARS-CoV-2 RNA is generally detectable in upper respiratory specimens during the acute phase of infection. The lowest concentration of SARS-CoV-2 viral copies this assay can detect is 138 copies/mL. A negative result does not preclude SARS-Cov-2 infection and should not be used as the sole basis for treatment or other patient management decisions. A negative result may occur with  improper specimen collection/handling, submission of specimen  other than nasopharyngeal swab, presence of viral mutation(s) within the areas targeted by this assay, and inadequate number of viral copies(<138 copies/mL). A negative result must be combined with clinical observations, patient history, and epidemiological information. The expected result is Negative.  Fact Sheet for Patients:  BloggerCourse.com  Fact Sheet for Healthcare Providers:  SeriousBroker.it  This test is no t yet approved or cleared by the [place] FDA and  has been authorized for detection and/or diagnosis of SARS-CoV-2 by FDA under an Emergency Use Authorization (EUA). This EUA will remain  in effect (meaning this test can be used) for the duration of the COVID-19 declaration under Section 564(b)(1) of the Act, 21 U.S.C.section 360bbb-3(b)(1), unless the  authorization is terminated  or revoked sooner.       Influenza A by PCR NEGATIVE NEGATIVE Final   Influenza B by PCR NEGATIVE NEGATIVE Final    Comment: (NOTE) The Xpert Xpress SARS-CoV-2/FLU/RSV plus assay is intended as an aid in the diagnosis of influenza from Nasopharyngeal swab specimens and should not be used as a sole basis for treatment. Nasal washings and aspirates are unacceptable for Xpert Xpress SARS-CoV-2/FLU/RSV testing.  Fact Sheet for Patients: BloggerCourse.com  Fact Sheet for Healthcare Providers: SeriousBroker.it  This test is not yet approved or cleared by the [place] FDA and has been authorized for detection and/or diagnosis of SARS-CoV-2 by FDA under an Emergency Use Authorization (EUA). This EUA will remain in effect (meaning this test can be used) for the duration of the COVID-19 declaration under Section 564(b)(1) of the Act, 21 U.S.C. section 360bbb-3(b)(1), unless the authorization is terminated or revoked.  Performed at [facility] [address]  [place], [place] [address]   Blood culture (routine single)     Status: None   Collection Time: 08/28/20 10:39 PM   Specimen: BLOOD  Result Value Ref Range Status   Specimen Description BLOOD LEFT ANTECUBITAL  Final   Special Requests   Final    BOTTLES DRAWN AEROBIC AND ANAEROBIC Blood Culture adequate volume   Culture   Final    NO GROWTH 5 DAYS Performed at [facility], [address] [place], [place] [address]    Report Status 09/02/2020 FINAL  Final  MRSA PCR Screening     Status: None   Collection Time: 08/30/20  1:32 PM  Result Value Ref Range Status   MRSA by PCR NEGATIVE NEGATIVE Final    Comment:        The GeneXpert MRSA Assay (FDA approved for NASAL specimens only), is one component of a comprehensive MRSA colonization surveillance program. It is not intended to diagnose MRSA infection nor to guide or monitor treatment for MRSA infections. Performed at [facility], [address] [place], [place] [address]   Urine culture     Status: None   Collection Time: 09/01/20  9:15 AM   Specimen: In/Out Cath Urine  Result Value Ref Range Status   Specimen Description   Final    IN/OUT CATH URINE Performed at [facility], [address] [place], [place] [address]    Special Requests   Final    NONE Performed at [facility], [address], [place], [place] [address]    Culture   Final    NO GROWTH Performed at [facility], [address], [place], [place] [address]    Report Status 09/02/2020 FINAL  Final  Aerobic Culture w Gram Stain (superficial specimen)     Status: None   Collection Time: 09/01/20  2:40 PM   Specimen: Wound  Result Value Ref Range Status   Specimen Description   Final    WOUND Performed at [facility], [address], [place], [place] [address]    Special Requests   Final    NONE Performed at [facility], [address] [place], [place] [address]    Gram Stain    Final    RARE WBC PRESENT, PREDOMINANTLY PMN FEW GRAM NEGATIVE RODS FEW GRAM POSITIVE COCCI IN CLUSTERS    Culture   Final    ABUNDANT METHICILLIN RESISTANT STAPHYLOCOCCUS AUREUS WITHIN MIXED ORGANISMS Performed at [facility], [address] [place], [place] [address]    Report Status 09/04/2020 FINAL  Final   Organism ID, Bacteria METHICILLIN RESISTANT STAPHYLOCOCCUS AUREUS  Final      Susceptibility   Methicillin resistant staphylococcus aureus - MIC*    CIPROFLOXACIN <=0.5 SENSITIVE Sensitive     ERYTHROMYCIN <=0.25 SENSITIVE Sensitive     GENTAMICIN <=0.5 SENSITIVE Sensitive     OXACILLIN >=4 RESISTANT Resistant     TETRACYCLINE >=16 RESISTANT Resistant     VANCOMYCIN <=0.5 SENSITIVE Sensitive     TRIMETH/SULFA <=10 SENSITIVE Sensitive     CLINDAMYCIN <=0.25 SENSITIVE Sensitive     RIFAMPIN <=0.5 SENSITIVE Sensitive     Inducible Clindamycin NEGATIVE Sensitive     * ABUNDANT METHICILLIN RESISTANT STAPHYLOCOCCUS AUREUS          Radiology Studies: CT ANGIO AO+BIFEM W & OR WO CONTRAST  Result Date: 09/07/2020 CLINICAL DATA:  50 year old female with a history pain and numbness right foot at 5 a.m. EXAM: CT ANGIOGRAPHY OF ABDOMINAL AORTA WITH ILIOFEMORAL RUNOFF TECHNIQUE: Multidetector CT imaging of the abdomen, pelvis and lower extremities was performed using the standard protocol during bolus administration of intravenous contrast. Multiplanar CT image reconstructions and MIPs were obtained to evaluate the vascular anatomy. CONTRAST:  OMNIPAQUE IOHEXOL 350 MG/ML SOLN COMPARISON:  None. FINDINGS: VASCULAR Aorta: Distal thoracic aorta without significant atherosclerotic changes. Diameter at the hiatus 19 mm. Mild calcified plaque at the level of the IMA origin. Pedunculated soft plaque/thrombus at the right aspect of the infrarenal abdominal aorta, just above the bifurcation. Plaque extends 50% of the cross-sectional diameter of the aorta. Celiac: Patent, with no  significant atherosclerotic changes. SMA: Patent, with no significant atherosclerotic changes. Renals: - Right: Right renal artery patent. - Left: Left renal artery patent. IMA: Inferior mesenteric artery is patent. Right lower extremity: Mild mixed calcified and soft plaque of the common iliac artery. No high-grade stenosis. 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 Hypogastric artery is patent. External iliac artery is patent with no significant atherosclerosis. Common femoral artery patent with no significant atherosclerosis. Profunda femoris is patent. There is an accessory thigh branch from the proximal SFA. SFA is patent from the origin through the popliteal artery with no significant atherosclerosis. Typical arrangement of the trifurcation. Anterior tibial artery patent from the origin to the distal third. There is absent contrast at the ankle within the anterior tibial artery. Tibioperoneal trunk is patent without significant atherosclerosis. Posterior tibial artery patent from the origin to the distal third. There is abrupt contrast cut off in the distal third. Peroneal artery patent from the origin to the distal third where there is abrupt contrast cut off distally. There is also a cut off of the anterior communicating artery from the peroneal artery. Left lower extremity: Mild mixed soft and calcified plaque of the common iliac artery. No high-grade stenosis or occlusion. No pedunculated plaque or ulcerated plaque. Hypogastric artery is patent. No significant atherosclerotic changes of the left external iliac artery. Common femoral artery patent. Closure device on the anterior wall of the left common femoral artery. Profunda femoris and the thigh branches patent. SFA is patent from the origin through the popliteal artery. Typical trifurcation arrangement of the left tibial arteries. Anterior tibial artery is patent from  the origin into the foot. Tibioperoneal trunk is patent. Posterior tibial artery is patent from the origin into the foot. Peroneal artery is patent from the origin into the foot. Veins:  Unremarkable appearance of the venous system. Review of the MIP images confirms the above findings. NON-VASCULAR Lower chest: Redemonstration of subpleural reticular changes at the lung bases, which are likely extension weighted from the prior CT given the low lung volumes and respiratory motion. No acute finding at the lung bases. Hepatobiliary: Unremarkable appearance of the liver. Unremarkable gall bladder. Pancreas: Unremarkable. Spleen: Unremarkable. Adrenals/Urinary Tract: - Right adrenal gland: Unremarkable - Left adrenal gland: Unremarkable. - Right kidney: No hydronephrosis, nephrolithiasis, inflammation, or ureteral dilation. No focal lesion. - Left Kidney: No hydronephrosis, nephrolithiasis, inflammation, or ureteral dilation. No focal lesion. - Urinary Bladder: Unremarkable. Stomach/Bowel: - Stomach: Unremarkable. - Small bowel: Unremarkable - Appendix: Normal. - Colon: Moderate stool burden. No evidence of obstruction. No inflammatory changes. Minimal diverticular disease of the sigmoid colon. Lymphatic: No adenopathy. Mesenteric: No free fluid or air. No mesenteric adenopathy. Reproductive: Hysterectomy Other: Subcutaneous gas in the right lower quadrant into separate location, likely injection sequela. Small fat containing umbilical hernia. Musculoskeletal: Mild degenerative changes of the spine. No acute displaced fracture. Mild degenerative changes of the hips. IMPRESSION: Irregular, pedunculated aortic plaque/thrombus of the lower infrarenal abdominal aorta, potentially a source of embolus. The plaque/thrombus contributes to 50% cross-sectional diameter narrowing of the lower infrarenal abdominal aorta. Aortic Atherosclerosis (ICD10-I70.0). Minimal bilateral iliac arterial disease. No significant femoropopliteal  disease. There is absent contrast column within the distal right AT, PT, and peroneal arteries, just above the ankle, favored to represent distal embolization, however, slow arrival of the contrast bolus could have this appearance. The above preliminary results were discussed by telephone at the time of interpretation on 09/07/2020 at 9:29 am with Dr. Myra Gianotti. Ancillary findings as above. Signed, Yvone Neu. Reyne Dumas, RPVI [facility] Electronically Signed   By: Gilmer Mor D.O.   On: 09/07/2020 09:31        Scheduled Meds:  [EHM Hold] vitamin C  250 mg Oral BID   [MAR Hold] atorvastatin  40 mg Oral Daily   [MAR Hold] cholecalciferol  1,000 Units Oral Daily   [MAR Hold] cholestyramine light  4 g Oral BID   [MAR Hold] citalopram  20 mg Oral Daily   [MAR Hold] feeding supplement  1 Container Oral TID BM   [MAR Hold] ferrous gluconate  324 mg Oral BID WC   [MAR Hold] gabapentin  400 mg Oral TID   [MAR Hold] loperamide  4 mg Oral TID   [MAR Hold] nicotine  21 mg Transdermal Daily   [MAR Hold] nutrition supplement (JUVEN)  1 packet Oral BID BM   [MAR Hold] pantoprazole  40 mg Oral Daily   [MAR Hold] sodium chloride flush  3 mL Intravenous Q12H   [MAR Hold] sulfamethoxazole-trimethoprim  1 tablet Oral Q12H   Continuous Infusions:  [MAR Hold] sodium chloride Stopped (08/29/20 1709)   heparin 950 Units/hr (09/07/20 1131)     LOS: 10 days    Time spent: 40 minutes    Ramiro Harvest, MD [facility]   To contact the attending provider between 7A-7P or the covering provider during after hours 7P-7A, please log into the web site [email or web address] and access using universal [place] password for that web site. If you do not have the password, please call the hospital operator.  09/07/2020, 12:10 PM

## 2020-09-07 NOTE — H&P (View-Only) (Signed)
Subjective  -   Continues to complain of pain in her right foot   Physical Exam:  Slight progression of mottling of the right foot.  She continues to be able to move her first second and third toe.  Sensation has not changed.          Assessment/Plan:   Ischemic right foot: I discussed the results of the CT scan with the patient.  I discussed the aortic thrombus.  It seems unlikely that pieces of this broke off and were able to go down all 3 tibial vessels simultaneously.  Based off of her angiogram from last week, she has a Buerger's-like picture with very small tibial vessels and minimal perfusion of the foot.  Clearly she is at very high risk for limb loss not only because of her acute ischemia but also because of the appearance of ulcer.  Regardless, there has been a change in her foot that did not respond to heparin therefore I discussed several options including surgical thrombectomy versus initiation of lytic therapy.  Because of the size of her tibial vessels and poor perfusion of the foot, I do not think that surgical thrombectomy will give [place] a adequate result because of the inability to improve the microcirculation.  Unfortunately that is most immediate way of reestablishing blood flow.  The other option is to initiate lytic therapy which does take longer to work, however would be better at improving the microcirculation.  Because she has an aortic thrombus, I do not want to dislodge it and so I will plan on sticking antegrade and initiating tPA infusion.  We did discuss that she will need her aortic thrombus addressed.  This could either be done percutaneously or via open surgical thrombectomy.  All questions were answered.  Wells Adebayo Ensminger 09/07/2020 11:32 AM --  Vitals:   09/07/20 0737 09/07/20 1126  BP: 106/62 119/63  Pulse: 78 90  Resp: 16 16  Temp: 98.6 F (37 C) 98.6 F (37 C)  SpO2: 98% 95%    Intake/Output Summary (Last 24 hours) at 09/07/2020 1132 Last  data filed at 09/07/2020 1019 Gross per 24 hour  Intake 524.01 ml  Output --  Net 524.01 ml     Laboratory CBC    Component Value Date/Time   WBC 15.4 (H) 09/07/2020 0426   HGB 11.6 (L) 09/07/2020 0426   HGB 13.5 11/08/2013 1424   HCT 36.3 09/07/2020 0426   HCT 42.1 11/08/2013 1424   PLT 397 09/07/2020 0426   PLT 304 11/08/2013 1424    BMET    Component Value Date/Time   NA 135 09/07/2020 0426   NA 133 (L) 11/08/2013 1424   K 3.4 (L) 09/07/2020 0426   K 3.7 11/08/2013 1424   CL 99 09/07/2020 0426   CL 100 11/08/2013 1424   CO2 25 09/07/2020 0426   CO2 23 11/08/2013 1424   GLUCOSE 93 09/07/2020 0426   GLUCOSE 92 11/08/2013 1424   BUN 20 09/07/2020 0426   BUN 10 11/08/2013 1424   CREATININE 0.64 09/07/2020 0426   CREATININE 0.73 11/08/2013 1424   CALCIUM 8.0 (L) 09/07/2020 0426   CALCIUM 8.3 (L) 11/08/2013 1424   GFRNONAA >60 09/07/2020 0426   GFRNONAA >60 11/08/2013 1424   GFRAA >60 03/25/2017 1955   GFRAA >60 11/08/2013 1424    COAG Lab Results  Component Value Date   INR 1.1 08/28/2020   No results found for: PTT  Antibiotics Anti-infectives (From admission, onward)  Start     Dose/Rate Route Frequency Ordered Stop   09/03/20 1500  sulfamethoxazole-trimethoprim (BACTRIM DS) 800-160 MG per tablet 1 tablet        1 tablet Oral Every 12 hours 09/03/20 1411     09/01/20 2200  amoxicillin-clavulanate (AUGMENTIN) 875-125 MG per tablet 1 tablet  Status:  Discontinued        1 tablet Oral Every 12 hours 09/01/20 1521 09/03/20 1411   08/31/20 2305  ceFAZolin (ANCEF) IVPB 2g/100 mL premix        2 g 200 mL/hr over 30 Minutes Intravenous 30 min pre-op 08/31/20 2305 09/01/20 1333   08/29/20 2200  vancomycin (VANCOREADY) IVPB 1250 mg/250 mL  Status:  Discontinued        1,250 mg 166.7 mL/hr over 90 Minutes Intravenous Every 24 hours 08/29/20 1359 08/31/20 0723   08/29/20 1400  vancomycin (VANCOREADY) IVPB 750 mg/150 mL  Status:  Discontinued        750 mg 150  mL/hr over 60 Minutes Intravenous Every 12 hours 08/29/20 0208 08/29/20 1359   08/29/20 0130  metroNIDAZOLE (FLAGYL) IVPB 500 mg  Status:  Discontinued        500 mg 100 mL/hr over 60 Minutes Intravenous Every 8 hours 08/29/20 0022 09/01/20 1521   08/29/20 0100  cefTRIAXone (ROCEPHIN) 2 g in sodium chloride 0.9 % 100 mL IVPB  Status:  Discontinued        2 g 200 mL/hr over 30 Minutes Intravenous Every 24 hours 08/29/20 0022 09/01/20 1521   08/28/20 2330  vancomycin (VANCOREADY) IVPB 1250 mg/250 mL        1,250 mg 166.7 mL/hr over 90 Minutes Intravenous  Once 08/28/20 2315 08/29/20 0330   08/28/20 2330  piperacillin-tazobactam (ZOSYN) IVPB 3.375 g        3.375 g 100 mL/hr over 30 Minutes Intravenous  Once 08/28/20 2315 08/28/20 2359        V. Charlena Cross, M.D., [facility] [place] Office: [phone] Pager:  [phone]

## 2020-09-07 NOTE — Progress Notes (Signed)
ANTICOAGULATION CONSULT NOTE  Pharmacy Consult for heparin infusion Indication:  VTE treatment  No Known Allergies  Patient Measurements: Height: 5\' 3"  (160 cm) Weight: 58.5 kg (129 lb) IBW/kg (Calculated) : 52.4  Vital Signs: Temp: 98.4 F (36.9 C) (06/26 0435) BP: 102/61 (06/26 0435) Pulse Rate: 80 (06/26 0435)  Labs: Recent Labs    09/05/20 0553 09/06/20 0512 09/06/20 2039 09/07/20 0426  HGB 10.2* 10.8*  --  11.6*  HCT 32.7* 34.0*  --  36.3  PLT 488* 401*  --  397  HEPARINUNFRC  --   --  0.78* 0.33  CREATININE 0.58 0.46  --   --      Estimated Creatinine Clearance: 68.8 mL/min (by C-G formula based on SCr of 0.46 mg/dL).   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 She was hospitalized 1 month ago and was discharged home on doxycycline which she completed without any significant improvement.  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 Pt underwent angiogram 09/01/20 without intervention. Pharmacy has been consulted for heparin dosing and monitoring. Pt might get repeat angiogram Monday (09/08/20).  6/25 2039 HL = 0.78 6/26 0426 HL = 0.33   Goal of Therapy:  Heparin level 0.3-0.7 units/ml Monitor platelets by anticoagulation protocol: Yes   Plan:  6/26:  HL @ 0426 = 0.33, therapeutic X 1  Will continue  pt on current rate and draw confirmation level on 6/26 @ 1000.   Trinda Harlacher D, PharmD 09/07/2020,5:03 AM

## 2020-09-07 NOTE — Op Note (Signed)
Patient name: Alexandra [name] MRN: [id number] DOB: 1970/04/20 Sex: female  09/07/2020 Pre-operative Diagnosis: Ischemic right foot Post-operative diagnosis:  Same Surgeon:  Durene Cal Procedure Performed:  1.  Antegrade ultrasound-guided access, right femoral artery  2.  Right lower extremity runoff  3.  Intra-arterial injection of tPA  4.  Initiation of thrombolytic therapy  5.  Conscious sedation, 27 minutes  \   Indications: This is a 50 year old female with nonhealing right leg ulcer who has been diagnosed with vasculitis.  She underwent angiography a week ago that showed poor perfusion onto her foot.  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  Yesterday she developed acute changes to her foot including pain and numbness.  She did have motor function.  Because this was felt to be small vessel disease, I tried IV heparin, because she did have Doppler signals down to her ulcer, but not onto her foot.  I also got a CT angiogram.  This revealed a aortic thrombus.  The patient did not improve overnight with IV heparin.  I discussed surgical thrombectomy versus thrombolytic therapy.  Because surgical thrombectomy would not address her microcirculation, I felt that initiation of lytic therapy provided the best chance for limb salvage.  She does have an aortic thrombus.  I felt that this was at high risk for being dislodged if I obtain contralateral access.  Therefore I discussed getting antegrade access.  I told her that we would need to address her aortic thrombus in the near future.  The last thing I wanted to do was to dislodge her thrombus and commit her to a surgical thrombectomy which would make thrombolytic therapy not an option.  Procedure:  The patient was identified in the holding area and taken to room 8.  The patient was then placed supine on the table and prepped and draped in the usual sterile fashion.  A time out was called.  Conscious sedation was  administered with the use of IV fentanyl and Versed under continuous physician and nurse monitoring.  Heart rate, blood pressure, and oxygen saturation were continuously monitored.  Total sedation time was 27 minutes.  Ultrasound was used to evaluate the right common femoral artery.  It was patent .  A digital ultrasound image was acquired.  A micropuncture needle was used to access the right common femoral artery under ultrasound guidance in antegrade fashion.  A 018 wire was inserted followed by micropuncture sheath.  A contrast injection was performed and then I manipulated the wire into the superficial femoral artery.  The sheath was then inserted into the superficial femoral artery.  A 035 wire was then placed followed by a 5 [place] sheath.  Contrast injections were then performed Findings:    Right Lower Extremity: The common femoral profundofemoral, superficial femoral, and popliteal artery were widely patent.  There did appear to be some spasm within the popliteal artery from the wire.  All 3 tibial vessels were patent down to the lower leg where they occluded.  There was minimal opacification of the vessels across the ankle onto the foot.   Intervention: After the images were acquired, I exchanged the 5 French sheath for a 6 French 45 cm sheath.  A 20 cm infusion length catheter was then placed.  10 mg of tPA was then administered and a pulse spray fashion.  Next, the tPA infusion was initiated through the infusion catheter, and heparin was 1 through the sheath.  This sheath was sutured into  position.  The patient be taken to the ICU for overnight tPA administration  Impression:  #1  Initiation of thrombolytic therapy  #2  Minimal opacification of the tibial vessels beginning at the ankle  #3  No significant atherosclerotic changes noted within the common femoral profundofemoral, superficial femoral, popliteal artery, and proximal tibial vessels    V. Durene Cal, M.D., [facility] [place] Office: [phone] Pager:  [phone]

## 2020-09-07 NOTE — Progress Notes (Signed)
Patient transported to specials. Heparin gtt at 9.5 ml/hr. Consent signed. Patient will transfer to ICU post-procedure. Perlie Mayo, RN

## 2020-09-07 NOTE — Progress Notes (Signed)
ANTICOAGULATION CONSULT NOTE  Pharmacy Consult for heparin infusion Indication:  VTE treatment  No Known Allergies  Patient Measurements: Height: 5\' 3"  (160 cm) Weight: 58.5 kg (129 lb) IBW/kg (Calculated) : 52.4  Vital Signs: Temp: 98.6 F (37 C) (06/26 0737) BP: 106/62 (06/26 0737) Pulse Rate: 78 (06/26 0737)  Labs: Recent Labs    09/05/20 0553 09/06/20 0512 09/06/20 2039 09/07/20 0426 09/07/20 0958  HGB 10.2* 10.8*  --  11.6*  --   HCT 32.7* 34.0*  --  36.3  --   PLT 488* 401*  --  397  --   HEPARINUNFRC  --   --  0.78* 0.33 0.20*  CREATININE 0.58 0.46  --  0.64  --      Estimated Creatinine Clearance: 68.8 mL/min (by C-G formula based on SCr of 0.64 mg/dL).   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 She was hospitalized 1 month ago and was discharged home on doxycycline which she completed without any significant improvement.  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 Pt underwent angiogram 09/01/20 without intervention. Pharmacy has been consulted for heparin dosing and monitoring. Pt might get repeat angiogram Monday (09/08/20).  6/25 2039 HL 0.78 6/26 0426 HL 0.33 6/26 0958 HL 0.20   Goal of Therapy:  Heparin level 0.3-0.7 units/ml Monitor platelets by anticoagulation protocol: Yes   Plan:   Heparin level subtherapeutic Heparin 800 unit bolus followed by increase in rate to 950 units/hr Recheck HL at 1800 CBC daily while on heparin drip  7/26, PharmD 09/07/2020,11:05 AM

## 2020-09-07 NOTE — Progress Notes (Signed)
ANTICOAGULATION CONSULT NOTE  Pharmacy Consult for heparin infusion Indication:  VTE treatment  No Known Allergies  Patient Measurements: Height: 5\' 3"  (160 cm) Weight: 57.9 kg (127 lb 10.3 oz) IBW/kg (Calculated) : 52.4  Vital Signs: Temp: 99.1 F (37.3 C) (06/26 1332) Temp Source: Oral (06/26 1332) BP: 111/55 (06/26 1500) Pulse Rate: 79 (06/26 1500)  Labs: Recent Labs    09/05/20 0553 09/06/20 0512 09/06/20 2039 09/07/20 0426 09/07/20 0958 09/07/20 1347  HGB 10.2* 10.8*  --  11.6*  --  11.1*  HCT 32.7* 34.0*  --  36.3  --  35.5*  PLT 488* 401*  --  397  --  355  HEPARINUNFRC  --   --    < > 0.33 0.20* 0.13*  CREATININE 0.58 0.46  --  0.64  --   --    < > = values in this interval not displayed.     Estimated Creatinine Clearance: 68.8 mL/min (by C-G formula based on SCr of 0.64 mg/dL).   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 She was hospitalized 1 month ago and was discharged home on doxycycline which she completed without any significant improvement.  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 Pt underwent angiogram 09/01/20 without intervention. Pharmacy has been consulted for heparin dosing and monitoring. Pt might get repeat angiogram Monday (09/08/20).  6/25 2039 HL 0.78 6/26 0426 HL 0.33 6/26  0958 HL 0.20  6/26 1400-- Alteplase infusion started -- Heparin decreased to 800 units/hr by vascular. New goal 0.2-0.5   Goal of Therapy:  Heparin level 0.2-0.5 units/ml Monitor platelets by anticoagulation protocol: Yes   Plan:  Continue heparin infusion @ 800 units/hr per vascular Check HL and fibrinogen Q6H while alteplase infusing CBC daily while on heparin drip  7/26, PharmD, BCPS 09/07/2020,4:06 PM

## 2020-09-07 NOTE — Progress Notes (Signed)
Subjective  -   Continues to complain of pain in her right foot   Physical Exam:  Slight progression of mottling of the right foot.  She continues to be able to move her first second and third toe.  Sensation has not changed.          Assessment/Plan:   Ischemic right foot: I discussed the results of the CT scan with the patient.  I discussed the aortic thrombus.  It seems unlikely that pieces of this broke off and were able to go down all 3 tibial vessels simultaneously.  Based off of her angiogram from last week, she has a Buerger's-like picture with very small tibial vessels and minimal perfusion of the foot.  Clearly she is at very high risk for limb loss not only because of her acute ischemia but also because of the appearance of ulcer.  Regardless, there has been a change in her foot that did not respond to heparin therefore I discussed several options including surgical thrombectomy versus initiation of lytic therapy.  Because of the size of her tibial vessels and poor perfusion of the foot, I do not think that surgical thrombectomy will give [place] a adequate result because of the inability to improve the microcirculation.  Unfortunately that is most immediate way of reestablishing blood flow.  The other option is to initiate lytic therapy which does take longer to work, however would be better at improving the microcirculation.  Because she has an aortic thrombus, I do not want to dislodge it and so I will plan on sticking antegrade and initiating tPA infusion.  We did discuss that she will need her aortic thrombus addressed.  This could either be done percutaneously or via open surgical thrombectomy.  All questions were answered.  Alexandra [name] 09/07/2020 11:32 AM --  Vitals:   09/07/20 0737 09/07/20 1126  BP: 106/62 119/63  Pulse: 78 90  Resp: 16 16  Temp: 98.6 F (37 C) 98.6 F (37 C)  SpO2: 98% 95%    Intake/Output Summary (Last 24 hours) at 09/07/2020 1132 Last  data filed at 09/07/2020 1019 Gross per 24 hour  Intake 524.01 ml  Output --  Net 524.01 ml     Laboratory CBC    Component Value Date/Time   WBC 15.4 (H) 09/07/2020 0426   HGB 11.6 (L) 09/07/2020 0426   HGB 13.5 11/08/2013 1424   HCT 36.3 09/07/2020 0426   HCT 42.1 11/08/2013 1424   PLT 397 09/07/2020 0426   PLT 304 11/08/2013 1424    BMET    Component Value Date/Time   NA 135 09/07/2020 0426   NA 133 (L) 11/08/2013 1424   K 3.4 (L) 09/07/2020 0426   K 3.7 11/08/2013 1424   CL 99 09/07/2020 0426   CL 100 11/08/2013 1424   CO2 25 09/07/2020 0426   CO2 23 11/08/2013 1424   GLUCOSE 93 09/07/2020 0426   GLUCOSE 92 11/08/2013 1424   BUN 20 09/07/2020 0426   BUN 10 11/08/2013 1424   CREATININE 0.64 09/07/2020 0426   CREATININE 0.73 11/08/2013 1424   CALCIUM 8.0 (L) 09/07/2020 0426   CALCIUM 8.3 (L) 11/08/2013 1424   GFRNONAA >60 09/07/2020 0426   GFRNONAA >60 11/08/2013 1424   GFRAA >60 03/25/2017 1955   GFRAA >60 11/08/2013 1424    COAG Lab Results  Component Value Date   INR 1.1 08/28/2020   No results found for: PTT  Antibiotics Anti-infectives (From admission, onward)  Start     Dose/Rate Route Frequency Ordered Stop   09/03/20 1500  sulfamethoxazole-trimethoprim (BACTRIM DS) 800-160 MG per tablet 1 tablet        1 tablet Oral Every 12 hours 09/03/20 1411     09/01/20 2200  amoxicillin-clavulanate (AUGMENTIN) 875-125 MG per tablet 1 tablet  Status:  Discontinued        1 tablet Oral Every 12 hours 09/01/20 1521 09/03/20 1411   08/31/20 2305  ceFAZolin (ANCEF) IVPB 2g/100 mL premix        2 g 200 mL/hr over 30 Minutes Intravenous 30 min pre-op 08/31/20 2305 09/01/20 1333   08/29/20 2200  vancomycin (VANCOREADY) IVPB 1250 mg/250 mL  Status:  Discontinued        1,250 mg 166.7 mL/hr over 90 Minutes Intravenous Every 24 hours 08/29/20 1359 08/31/20 0723   08/29/20 1400  vancomycin (VANCOREADY) IVPB 750 mg/150 mL  Status:  Discontinued        750 mg 150  mL/hr over 60 Minutes Intravenous Every 12 hours 08/29/20 0208 08/29/20 1359   08/29/20 0130  metroNIDAZOLE (FLAGYL) IVPB 500 mg  Status:  Discontinued        500 mg 100 mL/hr over 60 Minutes Intravenous Every 8 hours 08/29/20 0022 09/01/20 1521   08/29/20 0100  cefTRIAXone (ROCEPHIN) 2 g in sodium chloride 0.9 % 100 mL IVPB  Status:  Discontinued        2 g 200 mL/hr over 30 Minutes Intravenous Every 24 hours 08/29/20 0022 09/01/20 1521   08/28/20 2330  vancomycin (VANCOREADY) IVPB 1250 mg/250 mL        1,250 mg 166.7 mL/hr over 90 Minutes Intravenous  Once 08/28/20 2315 08/29/20 0330   08/28/20 2330  piperacillin-tazobactam (ZOSYN) IVPB 3.375 g        3.375 g 100 mL/hr over 30 Minutes Intravenous  Once 08/28/20 2315 08/28/20 2359        V. Alexandra Sylvia Kondracki IV, M.D., FACS [facility]  Office: [phone] Pager:  [phone]  

## 2020-09-08 ENCOUNTER — Encounter
Admission: EM | Disposition: A | Discharge status: Home Health | Payer: Self-pay | Source: Home / Self Care | Attending: Internal Medicine

## 2020-09-08 ENCOUNTER — Inpatient Hospital Stay: Payer: Medicaid Other | Admitting: Anesthesiology

## 2020-09-08 ENCOUNTER — Encounter: Payer: Self-pay | Admitting: Surgery

## 2020-09-08 ENCOUNTER — Other Ambulatory Visit (INDEPENDENT_AMBULATORY_CARE_PROVIDER_SITE_OTHER): Payer: Self-pay | Admitting: Vascular Surgery

## 2020-09-08 ENCOUNTER — Inpatient Hospital Stay: Payer: Medicaid Other

## 2020-09-08 DIAGNOSIS — I7409 Other arterial embolism and thrombosis of abdominal aorta: Secondary | ICD-10-CM

## 2020-09-08 DIAGNOSIS — I70261 Atherosclerosis of native arteries of extremities with gangrene, right leg: Secondary | ICD-10-CM

## 2020-09-08 HISTORY — PX: ENDOVASCULAR STENT INSERTION: SHX5161

## 2020-09-08 LAB — BASIC METABOLIC PANEL
Anion gap: 5 (ref 5–15)
BUN: 11 mg/dL (ref 6–20)
CO2: 26 mmol/L (ref 22–32)
Calcium: 7.9 mg/dL — ABNORMAL LOW (ref 8.9–10.3)
Chloride: 104 mmol/L (ref 98–111)
Creatinine, Ser: 0.43 mg/dL — ABNORMAL LOW (ref 0.44–1.00)
GFR, Estimated: >60 mL/min (ref >60)
Glucose, Bld: 107 mg/dL — ABNORMAL HIGH (ref 70–99)
Potassium: 4 mmol/L (ref 3.5–5.1)
Sodium: 135 mmol/L (ref 135–145)

## 2020-09-08 LAB — CBC
HCT: 34 % — ABNORMAL LOW (ref 36.0–46.0)
HCT: 33.6 % — ABNORMAL LOW (ref 36.0–46.0)
HCT: 35.5 % — ABNORMAL LOW (ref 36.0–46.0)
Hemoglobin: 10.8 g/dL — ABNORMAL LOW (ref 12.0–15.0)
Hemoglobin: 10.5 g/dL — ABNORMAL LOW (ref 12.0–15.0)
Hemoglobin: 11 g/dL — ABNORMAL LOW (ref 12.0–15.0)
MCH: 24.4 pg — ABNORMAL LOW (ref 26.0–34.0)
MCH: 23.7 pg — ABNORMAL LOW (ref 26.0–34.0)
MCH: 23.6 pg — ABNORMAL LOW (ref 26.0–34.0)
MCHC: 31.8 g/dL (ref 30.0–36.0)
MCHC: 31.3 g/dL (ref 30.0–36.0)
MCHC: 31 g/dL (ref 30.0–36.0)
MCV: 76.7 fL — ABNORMAL LOW (ref 80.0–100.0)
MCV: 75.8 fL — ABNORMAL LOW (ref 80.0–100.0)
MCV: 76 fL — ABNORMAL LOW (ref 80.0–100.0)
Platelets: 298 10*3/uL (ref 150–400)
Platelets: 291 10*3/uL (ref 150–400)
Platelets: 308 10*3/uL (ref 150–400)
RBC: 4.43 MIL/uL (ref 3.87–5.11)
RBC: 4.43 MIL/uL (ref 3.87–5.11)
RBC: 4.67 MIL/uL (ref 3.87–5.11)
RDW: 18.8 % — ABNORMAL HIGH (ref 11.5–15.5)
RDW: 18.6 % — ABNORMAL HIGH (ref 11.5–15.5)
RDW: 19.1 % — ABNORMAL HIGH (ref 11.5–15.5)
WBC: 16.2 10*3/uL — ABNORMAL HIGH (ref 4.0–10.5)
WBC: 15.6 10*3/uL — ABNORMAL HIGH (ref 4.0–10.5)
WBC: 14.4 10*3/uL — ABNORMAL HIGH (ref 4.0–10.5)
nRBC: 0 % (ref 0.0–0.2)
nRBC: 0 % (ref 0.0–0.2)
nRBC: 0 % (ref 0.0–0.2)

## 2020-09-08 LAB — TYPE AND SCREEN
ABO/RH(D): A POS
Antibody Screen: NEGATIVE

## 2020-09-08 LAB — HEPARIN LEVEL (UNFRACTIONATED)
Heparin Unfractionated: 0.14 IU/mL — ABNORMAL LOW (ref 0.30–0.70)
Heparin Unfractionated: 0.12 IU/mL — ABNORMAL LOW (ref 0.30–0.70)

## 2020-09-08 LAB — MAGNESIUM: Magnesium: 2 mg/dL (ref 1.7–2.4)

## 2020-09-08 LAB — HEPATITIS B SURFACE ANTIGEN

## 2020-09-08 LAB — FIBRINOGEN
Fibrinogen: 412 mg/dL (ref 210–475)
Fibrinogen: 382 mg/dL (ref 210–475)

## 2020-09-08 SURGERY — ENDOVASCULAR STENT GRAFT INSERTION
Anesthesia: General

## 2020-09-08 SURGERY — LOWER EXTREMITY ANGIOGRAPHY
Anesthesia: General | Laterality: Right

## 2020-09-08 MED ORDER — PROPOFOL 10 MG/ML IV BOLUS
INTRAVENOUS | Status: AC
  Filled 2020-09-08: qty 20

## 2020-09-08 MED ORDER — FENTANYL CITRATE (PF) 100 MCG/2ML IJ SOLN
INTRAMUSCULAR | Status: AC
  Administered 2020-09-08: 50 ug via INTRAVENOUS
  Filled 2020-09-08: qty 2

## 2020-09-08 MED ORDER — LIDOCAINE HCL (CARDIAC) PF 100 MG/5ML IV SOSY
PREFILLED_SYRINGE | INTRAVENOUS | Status: DC | PRN
  Administered 2020-09-08: 60 mg via INTRAVENOUS

## 2020-09-08 MED ORDER — HYDROCODONE-ACETAMINOPHEN 5-325 MG PO TABS
ORAL_TABLET | ORAL | Status: AC
  Administered 2020-09-08: 2 via ORAL
  Filled 2020-09-08: qty 2

## 2020-09-08 MED ORDER — BUPIVACAINE-EPINEPHRINE (PF) 0.25% -1:200000 IJ SOLN
INTRAMUSCULAR | Status: AC
  Filled 2020-09-08: qty 30

## 2020-09-08 MED ORDER — MIDAZOLAM HCL 5 MG/5ML IJ SOLN
INTRAMUSCULAR | Status: AC
  Filled 2020-09-08: qty 5

## 2020-09-08 MED ORDER — HYDROMORPHONE HCL 1 MG/ML IJ SOLN
INTRAMUSCULAR | Status: AC
  Administered 2020-09-08: 0.25 mg via INTRAVENOUS
  Filled 2020-09-08: qty 1

## 2020-09-08 MED ORDER — EPHEDRINE SULFATE 50 MG/ML IJ SOLN
INTRAMUSCULAR | Status: DC | PRN
  Administered 2020-09-08 (×3): 5 mg via INTRAVENOUS
  Administered 2020-09-08: 10 mg via INTRAVENOUS

## 2020-09-08 MED ORDER — ACETAMINOPHEN 10 MG/ML IV SOLN
INTRAVENOUS | Status: DC | PRN
  Administered 2020-09-08: 1000 mg via INTRAVENOUS

## 2020-09-08 MED ORDER — HEPARIN (PORCINE) 25000 UT/250ML-% IV SOLN
1100.0000 [IU]/h | INTRAVENOUS | Status: AC
  Administered 2020-09-08 – 2020-09-11 (×3): 900 [IU]/h via INTRAVENOUS
  Filled 2020-09-08 (×2): qty 250

## 2020-09-08 MED ORDER — ONDANSETRON HCL 4 MG/2ML IJ SOLN
INTRAMUSCULAR | Status: DC | PRN
  Administered 2020-09-08: 4 mg via INTRAVENOUS

## 2020-09-08 MED ORDER — MIDAZOLAM HCL 2 MG/2ML IJ SOLN
INTRAMUSCULAR | Status: AC
  Filled 2020-09-08: qty 2

## 2020-09-08 MED ORDER — FENTANYL CITRATE (PF) 100 MCG/2ML IJ SOLN
INTRAMUSCULAR | Status: DC | PRN
  Administered 2020-09-08: 25 ug via INTRAVENOUS

## 2020-09-08 MED ORDER — ACETAMINOPHEN 10 MG/ML IV SOLN
INTRAVENOUS | Status: AC
  Filled 2020-09-08: qty 100

## 2020-09-08 MED ORDER — OXYCODONE HCL 5 MG PO TABS: 5.0000 mg | ORAL_TABLET | Freq: Once | ORAL | Status: DC | PRN

## 2020-09-08 MED ORDER — MIDAZOLAM HCL 2 MG/2ML IJ SOLN
INTRAMUSCULAR | Status: DC | PRN
  Administered 2020-09-08: 2 mg via INTRAVENOUS

## 2020-09-08 MED ORDER — PROPOFOL 10 MG/ML IV BOLUS
INTRAVENOUS | Status: DC | PRN
  Administered 2020-09-08: 120 mg via INTRAVENOUS

## 2020-09-08 MED ORDER — HEPARIN SODIUM (PORCINE) 5000 UNIT/ML IJ SOLN
INTRAMUSCULAR | Status: AC
  Filled 2020-09-08: qty 1

## 2020-09-08 MED ORDER — FENTANYL CITRATE (PF) 100 MCG/2ML IJ SOLN
25.0000 ug | INTRAMUSCULAR | Status: AC | PRN
  Administered 2020-09-08 (×3): 25 ug via INTRAVENOUS

## 2020-09-08 MED ORDER — HYDROMORPHONE HCL 1 MG/ML IJ SOLN
0.2500 mg | INTRAMUSCULAR | Status: DC | PRN
Start: 2020-09-08 — End: 2020-09-08
  Administered 2020-09-08 (×3): 0.25 mg via INTRAVENOUS

## 2020-09-08 MED ORDER — DEXAMETHASONE SODIUM PHOSPHATE 10 MG/ML IJ SOLN
INTRAMUSCULAR | Status: DC | PRN
  Administered 2020-09-08: 10 mg via INTRAVENOUS

## 2020-09-08 MED ORDER — PREDNISONE 20 MG PO TABS
60.0000 mg | ORAL_TABLET | Freq: Every day | ORAL | Status: DC
  Administered 2020-09-08 – 2020-09-12 (×5): 60 mg via ORAL
  Filled 2020-09-08 (×5): qty 3

## 2020-09-08 MED ORDER — FENTANYL CITRATE (PF) 100 MCG/2ML IJ SOLN
INTRAMUSCULAR | Status: AC
  Filled 2020-09-08: qty 2

## 2020-09-08 MED ORDER — HEPARIN SODIUM (PORCINE) 1000 UNIT/ML IJ SOLN
INTRAMUSCULAR | Status: DC | PRN
  Administered 2020-09-08: 3000 [IU] via INTRAVENOUS

## 2020-09-08 MED ORDER — SODIUM CHLORIDE 0.9 % IV SOLN
INTRAVENOUS | Status: DC | PRN
  Administered 2020-09-08: 15 ug/min via INTRAVENOUS

## 2020-09-08 MED ORDER — SODIUM CHLORIDE 0.9 % IV SOLN
INTRAVENOUS | Status: DC | PRN
  Administered 2020-09-08: 150 mL via INTRAMUSCULAR

## 2020-09-08 MED ORDER — HEMOSTATIC AGENTS (NO CHARGE) OPTIME
TOPICAL | Status: DC | PRN
  Administered 2020-09-08: 1 via TOPICAL

## 2020-09-08 MED ORDER — CEFAZOLIN SODIUM-DEXTROSE 2-4 GM/100ML-% IV SOLN
INTRAVENOUS | Status: AC
  Filled 2020-09-08: qty 100

## 2020-09-08 MED ORDER — LACTATED RINGERS IV SOLN: INTRAVENOUS | Status: DC | PRN

## 2020-09-08 MED ORDER — CEFAZOLIN SODIUM-DEXTROSE 2-3 GM-%(50ML) IV SOLR
INTRAVENOUS | Status: DC | PRN
  Administered 2020-09-08: 2 g via INTRAVENOUS

## 2020-09-08 MED ORDER — HEPARIN SODIUM (PORCINE) 1000 UNIT/ML IJ SOLN
INTRAMUSCULAR | Status: AC
  Filled 2020-09-08: qty 1

## 2020-09-08 MED ORDER — SODIUM CHLORIDE 0.9 % IV SOLN: INTRAVENOUS | Status: DC

## 2020-09-08 MED ORDER — PHENYLEPHRINE HCL (PRESSORS) 10 MG/ML IV SOLN
INTRAVENOUS | Status: DC | PRN
  Administered 2020-09-08: 100 ug via INTRAVENOUS
  Administered 2020-09-08: 200 ug via INTRAVENOUS
  Administered 2020-09-08: 100 ug via INTRAVENOUS
  Administered 2020-09-08: 200 ug via INTRAVENOUS
  Administered 2020-09-08: 100 ug via INTRAVENOUS
  Administered 2020-09-08: 200 ug via INTRAVENOUS

## 2020-09-08 MED ORDER — OXYCODONE HCL 5 MG/5ML PO SOLN: 5.0000 mg | Freq: Once | ORAL | Status: DC | PRN

## 2020-09-08 MED ORDER — ROCURONIUM BROMIDE 100 MG/10ML IV SOLN
INTRAVENOUS | Status: DC | PRN
  Administered 2020-09-08: 50 mg via INTRAVENOUS

## 2020-09-08 SURGICAL SUPPLY — 68 items
ADH SKN CLS APL DERMABOND .7 (GAUZE/BANDAGES/DRESSINGS) ×2
BAG DECANTER FOR FLEXI CONT (MISCELLANEOUS) ×2 IMPLANT
BALLN ULTRVRSE 10X40X75 (BALLOONS) ×2
BALLN ULTRVRSE 6X60X75C (BALLOONS) ×2
BALLN ULTRVRSE 9X40X75C (BALLOONS) ×2
BALLOON ULTRVRSE 10X40X75 (BALLOONS) IMPLANT
BALLOON ULTRVRSE 6X60X75C (BALLOONS) IMPLANT
BALLOON ULTRVRSE 9X40X75C (BALLOONS) IMPLANT
BLADE SURG 15 STRL LF DISP TIS (BLADE) ×1 IMPLANT
BLADE SURG 15 STRL SS (BLADE) ×2
BLADE SURG SZ11 CARB STEEL (BLADE) ×2 IMPLANT
BOOT SUTURE AID YELLOW STND (SUTURE) ×2 IMPLANT
BRUSH SCRUB EZ  4% CHG (MISCELLANEOUS) ×1
BRUSH SCRUB EZ 4% CHG (MISCELLANEOUS) ×1 IMPLANT
CATH BEACON 5 .035 40 KMP TP (CATHETERS) IMPLANT
CATH BEACON 5 .038 40 KMP TP (CATHETERS) ×1
COVER PROBE FLX POLY STRL (MISCELLANEOUS) ×1 IMPLANT
DERMABOND ADVANCED (GAUZE/BANDAGES/DRESSINGS) ×2
DERMABOND ADVANCED .7 DNX12 (GAUZE/BANDAGES/DRESSINGS) ×1 IMPLANT
DEVICE STARCLOSE SE CLOSURE (Vascular Products) ×1 IMPLANT
DRSG TEGADERM 2-3/8X2-3/4 SM (GAUZE/BANDAGES/DRESSINGS) ×1 IMPLANT
ELECT CAUTERY BLADE 6.4 (BLADE) ×2 IMPLANT
ELECT REM PT RETURN 9FT ADLT (ELECTROSURGICAL) ×2
ELECTRODE REM PT RTRN 9FT ADLT (ELECTROSURGICAL) ×1 IMPLANT
GAUZE 4X4 16PLY ~~LOC~~+RFID DBL (SPONGE) ×2 IMPLANT
GAUZE SPONGE 4X4 16PLY XRAY LF (GAUZE/BANDAGES/DRESSINGS) ×2 IMPLANT
GLOVE SURG SYN 7.0 (GLOVE) ×4 IMPLANT
GLOVE SURG SYN 7.0 PF PI (GLOVE) ×2 IMPLANT
GLOVE SURG SYN 8.0 (GLOVE) ×2 IMPLANT
GLOVE SURG SYN 8.0 PF PI (GLOVE) ×1 IMPLANT
GOWN STRL REUS W/ TWL LRG LVL3 (GOWN DISPOSABLE) ×2 IMPLANT
GOWN STRL REUS W/ TWL XL LVL3 (GOWN DISPOSABLE) ×2 IMPLANT
GOWN STRL REUS W/TWL LRG LVL3 (GOWN DISPOSABLE) ×4
GOWN STRL REUS W/TWL XL LVL3 (GOWN DISPOSABLE) ×4
HEMOSTAT SURGICEL 2X3 (HEMOSTASIS) ×1 IMPLANT
IV NS 500ML (IV SOLUTION) ×2
IV NS 500ML BAXH (IV SOLUTION) ×1 IMPLANT
KIT ENCORE 26 ADVANTAGE (KITS) ×2 IMPLANT
LOOP RED MAXI  1X406MM (MISCELLANEOUS) ×2
LOOP VESSEL MAXI 1X406 RED (MISCELLANEOUS) ×1 IMPLANT
LOOP VESSEL MINI 0.8X406 BLUE (MISCELLANEOUS) ×1 IMPLANT
LOOPS BLUE MINI 0.8X406MM (MISCELLANEOUS) ×1
MANIFOLD NEPTUNE II (INSTRUMENTS) ×2 IMPLANT
NDL HYPO 25X1 1.5 SAFETY (NEEDLE) IMPLANT
NDL SAFETY ECLIPSE 18X1.5 (NEEDLE) IMPLANT
NEEDLE HYPO 18GX1.5 SHARP (NEEDLE) ×2
NEEDLE HYPO 25X1 1.5 SAFETY (NEEDLE) ×2 IMPLANT
PACK ANGIOGRAPHY (CUSTOM PROCEDURE TRAY) ×1 IMPLANT
PACK BASIN MAJOR ARMC (MISCELLANEOUS) ×2 IMPLANT
SET CATH ROTAREX 8FR 85 (CATHETERS) ×1 IMPLANT
SHEATH BRITE TIP 6FRX11 (SHEATH) ×1 IMPLANT
SHEATH BRITE TIP 8FRX11 (SHEATH) ×1 IMPLANT
SPONGE T-LAP 18X18 ~~LOC~~+RFID (SPONGE) ×4 IMPLANT
STENT LIFESTREAM 7X26X80 (Permanent Stent) ×1 IMPLANT
STENT LIFESTREAM 8X37X80 (Permanent Stent) ×1 IMPLANT
STENT LIFESTREAM 8X58X80 (Permanent Stent) IMPLANT
SUT MNCRL 4-0 (SUTURE) ×2
SUT MNCRL 4-0 27XMFL (SUTURE) ×1
SUT PROLENE 6 0 BV (SUTURE) ×4 IMPLANT
SUT VIC AB 2-0 CT1 (SUTURE) ×2 IMPLANT
SUT VICRYL+ 3-0 36IN CT-1 (SUTURE) ×2 IMPLANT
SUTURE MNCRL 4-0 27XMF (SUTURE) ×1 IMPLANT
SYR 10ML LL (SYRINGE) IMPLANT
SYR 20ML LL LF (SYRINGE) ×2 IMPLANT
SYR 3ML LL SCALE MARK (SYRINGE) ×1 IMPLANT
TOWEL OR 17X26 4PK STRL BLUE (TOWEL DISPOSABLE) ×1 IMPLANT
WIRE G 018X200 V18 (WIRE) ×1 IMPLANT
WIRE MAGIC TOR.035 180C (WIRE) ×2 IMPLANT

## 2020-09-08 NOTE — Op Note (Signed)
[facility]  Percutaneous Study/Intervention Procedural Note   Date of Surgery: 09/08/2020  Surgeon(s):Davison Ohms    Assistants:none  Pre-operative Diagnosis: PAD with ulceration and gangrenous changes right foot, aortic thrombus seen on CT scan, status post overnight thrombolytic therapy on the right leg  Post-operative diagnosis:  Same  Procedure(s) Performed:             1.  Ultrasound guidance for vascular access left femoral artery             2.  Catheter placement into aorta from bilateral femoral approaches             3.  Right femoral artery cutdown             4.  Mechanical thrombectomy of the aorta and right common iliac artery with the 8 [place] Rex device             5.  Kissing balloon stent placement to bilateral common iliac arteries and the distal aorta with 7 mm diameter by 26 mm length stent on the left and 8 mm diameter by 37 mm length stent on the right which was postdilated with a 9 to 10 mm balloon distally  6.   StarClose closure device left femoral artery  EBL: 50 cc  Contrast: 60 cc  Assistant: Raul Del, PA-C  Anesthesia: General              Indications:  Patient is a 50 y.o.female with ischemic right foot with ulceration and gangrenous changes.  She is overnight thrombolytic therapy through an antegrade sheath in the right femoral artery downstream.  Her foot remains profoundly ischemic.  She has CT scan which showed distal aortic thrombus.  The patient is brought in for angiography for further evaluation and potential treatment.  Due to the limb threatening nature of the situation, angiogram was performed for attempted limb salvage. The patient is aware that if the procedure fails, amputation would be expected.  The patient also understands that even with successful revascularization, amputation may still be required due to the severity of the situation. Risks and benefits are discussed and informed consent is obtained.    Procedure:  The patient was identified and appropriate procedural time out was performed.  The patient was then placed supine on the table and prepped and draped in the usual sterile fashion. Moderate conscious sedation was administered during a face to face encounter with the patient throughout the procedure with my supervision of the RN administering medicines and monitoring the patient's vital signs, pulse oximetry, telemetry and mental status throughout from the start of the procedure until the patient was taken to the recovery room.  I started by making cutdown overlying the right femoral artery.  A cutdown around the existing sheath and lysis catheter that was in the right femoral artery going antegrade distally.  We went down to the sheath and got control of the common femoral artery proximally, the profunda and superficial femoral arteries at their origins.  These were controlled with Vesseloops and the existing sheath was removed.  I then put an 8 French sheath in through the arteriotomy on the right and continue to use the Vesseloops for control.  Ultrasound was used to evaluate the left common femoral artery.  It was patent .  A digital ultrasound image was acquired.  A Seldinger needle was used to access the left common femoral artery under direct ultrasound guidance and a permanent image was performed.  A 0.035 J wire was advanced without resistance and a 6Fr sheath was placed.  Imaging was performed through both sheaths as well as a Kumpe catheter and the aorta showing a faint filling defect on the distal aorta on the right lateral side extending into the proximal right common iliac artery.  Although the flow lumen remained present, this was clearly the thrombus seen on CT scan and needed addressed.  I then put a 6 mm diameter balloon up in the left common iliac artery to protect the left side from any embolization during the thrombectomy.  Mechanical thrombectomy was then performed using the 8  [place] Rex device in the right common iliac artery and aorta.  Several passes were made.  Imaging following this showed some stringy residual thrombus in the distal aorta and proximal right common iliac artery with some mild narrowing particularly on the right lateral wall of the distal aorta and proximal common iliac artery.  I elected to place kissing stents up each side at this point.  On the left, 7 mm diameter by 26 mm length lifestream stent was deployed about a centimeter into the aorta and the rest being in the left common iliac artery.  On the right, an 8 mm diameter by 37 mm length lifestream stent was taken about a centimeter into the aorta and then down into the mid right common iliac artery.  These were inflated to 12 atm.  On the right, due to poststenotic dilatation we had to post dilate with a 9 and a 10 mm balloon to get seal distally but there was a good seal at this point with no significant residual stenosis or thrombus seen on either side.  I elected to terminate the procedure. The sheath was removed and StarClose closure device was deployed in the left femoral artery with excellent hemostatic result.  The sheath was removed from the right side.  5 interrupted 6-0 Prolene sutures were used to close the arteriotomy in the right femoral artery with hemostasis achieved.  Surgicel was placed.  The wound was then irrigated and closed with 2 layers of 2-0 Vicryl, 2 layers of 3-0 Vicryl and 4-0 Monocryl.  Sterile dressing was placed on the right.  The patient was taken to the recovery room in stable condition having tolerated the procedure well.  Findings:               Aortogram:  a faint filling defect on the distal aorta on the right lateral side extending into the proximal right common iliac artery.  Although the flow lumen remained present, this was clearly the thrombus seen on CT scan and needed addressed.  This created some narrowing in the proximal right common iliac artery.  Both  external iliac arteries were widely patent.  There is not an obvious defect in the left common iliac artery, but with the location of the distal aorta, kissing stents would be required to protect the left iliac artery when treating the right.          Disposition: Patient was taken to the recovery room in stable condition having tolerated the procedure well.  Complications: None  Alexandra [name] 09/08/2020 4:38 PM   This note was created with Dragon Medical transcription system. Any errors in dictation are purely unintentional.

## 2020-09-08 NOTE — Progress Notes (Signed)
ANTICOAGULATION CONSULT NOTE  Pharmacy Consult for heparin infusion Indication:  VTE treatment  No Known Allergies  Patient Measurements: Height: 5\' 3"  (160 cm) Weight: 57.9 kg (127 lb 10.3 oz) IBW/kg (Calculated) : 52.4  Vital Signs: Temp: 98.2 F (36.8 C) (06/27 0400) Temp Source: Oral (06/27 0400) BP: 104/59 (06/27 0600) Pulse Rate: 74 (06/27 0600)  Labs: Recent Labs    09/06/20 0512 09/06/20 2039 09/07/20 0426 09/07/20 0958 09/07/20 1347 09/07/20 1929 09/08/20 0154 09/08/20 0422  HGB 10.8*  --  11.6*  --  11.1* 11.6* 10.8* 10.5*  HCT 34.0*  --  36.3  --  35.5* 36.9 34.0* 33.6*  PLT 401*  --  397  --  355 348 298 291  HEPARINUNFRC  --    < > 0.33   < > 0.13* 0.20* 0.14*  --   CREATININE 0.46  --  0.64  --   --   --   --  0.43*   < > = values in this interval not displayed.     Estimated Creatinine Clearance: 68.8 mL/min (A) (by C-G formula based on SCr of 0.43 mg/dL (L)).   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 She was hospitalized 1 month ago and was discharged home on doxycycline which she completed without any significant improvement.  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 Pt underwent angiogram 09/01/20 without intervention. Pharmacy has been consulted for heparin dosing and monitoring. H&H,  platelets stable   Goal of Therapy:  Heparin level 0.2-0.5 units/ml Monitor platelets by anticoagulation protocol: Yes   Plan:  Anti-Xa level subtherapeutic: increase rate slightly to 900 units/hr recheck HL 6 hrs after rate change Repeat CBC in am  09/03/20, PharmD 09/08/2020,7:04 AM

## 2020-09-08 NOTE — Progress Notes (Signed)
PROGRESS NOTE    [name]  MGQ:[id number] DOB: May 11, 1970 DOA: 08/28/2020 PCP: [facility]    Chief Complaint  Patient presents with   Wound Check    Brief Narrative:  HPI was taken from Dr. Joylene Igo: Alexandra [name] is a 50 y.o. 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 Patient states that she has had this ulcer for 3 months, it is nonhealing and appears to be extending to the anterior surface of her right leg.  She rates her pain a 10 x 10 in intensity at its worst and pain is usually at rest.  When she is ambulating or standing on her feet she does not feel any pain. She has increased purulent drainage from the wound but denies having any fever or chills.  She has felt unwell and complains of nausea without any emesis or abdominal pain. She was hospitalized 1 month ago and was discharged home on doxycycline which she completed without any significant improvement.  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  Patient was referred to rheumatology and has an appointment scheduled for July 7. She denies having any chest pain,  no headache, no cough, no abdominal pain, no changes in her bowel habits, no dizziness, no lightheadedness no urinary symptoms or changes in her bowel habits. Labs showed sodium 138, potassium 3.5, chloride 105, bicarb 25, glucose 88, BUN 15, creatinine 0.75, calcium 8.7, alkaline phosphatase 85, albumin 3.3, AST 16, ALT 11, total protein 7.3, lactic acid 1.4, white count 9.5, hemoglobin 10.7, hematocrit 35.0, MCV 77.6, RDW 18.2, platelet count  499, PT 13.9, INR 1.1 Respiratory viral panel is negative Right ankle x-ray shows diffuse soft tissue swelling or more focal ulceration over the lateral malleolus and distal fibular diaphysis where some early periosteal reaction is suggested. May implicate reactive periostitis or early osteomyelitis. Chest x-ray reviewed by me shows coarse reticular and patchy opacities in the lung bases favor superimposed edematous change given vascular congestion, septal thickening and cuffing. Underlying infection is difficult to fully exclude. Low volumes with additional features of atelectasis. Twelve-lead EKG reviewed by me shows sinus rhythm.       ED Course: Patient is a 50 year old female who presents to the ER for evaluation of pain and increased purulent drainage from a chronic ulcer over her right ankle. Imaging shows early periosteal reaction which could be reactive periostitis versus early osteomyelitis. She will be admitted to the hospital for further evaluation.     Hospital course from Dr. Mayford Knife 6/17-6/21/22: Pt was found to have nonhealing right lateral malleolus ulcer & possible cellulitis. Pt has a positive RF and P-ANCA and there is a concern for vasculitis. Rheumatology and ID are following the pt. Other serologies were sent as per rheum (see rheum's note). Pt was started on IV steroids x 3 days and CT chest ordered to further evaluate reticular nodular lesions in the lungs was ordered as per rheum.Of note, pt is now on po Bactrim as per ID and podiatry no longer recommends f/u w/ them outpatient for possible debridement but to f/u w/ a wound care center and consider hyperbaric oxygen to help w/ wound healing.  Patient received Rituxan under heme/onc. -Hospitalization complicated with ischemic right foot and aortic thrombus noted. -Patient on IV heparin and tPA initiated per vascular surgery. -Vascular surgery following.     Assessment & Plan:   Principal Problem:   Osteomyelitis  of ankle (HCC) Active Problems:   Tobacco use disorder   Depression   Iron deficiency anemia   Thrombocytosis   Foot pain, right   Aortic thrombus (HCC)   Ischemic pain of right foot: Concern for/probable   1 nonhealing right lateral malleolus ulcer/cellulitis-P ANCA vasculitis -Patient noted to P ANCA positive.  Chest x-ray, CT chest with interstitial lung disease. -Concern for possible Buerger's disease with history of smoking. -Right lower extremity ultrasound negative for DVT. -Patient was seen in consultation by podiatry who no longer recommends follow-up with them in the outpatient setting but to follow-up with the wound care center and consider hyperbaric oxygen to help with wound healing. -Patient seen by vascular surgery, underwent angiogram without intervention with normal common femoral artery, profunda femoris artery was small but normal, superficial femoral artery and popliteal arteries normal.  Typical tibial trifurcation with three-vessel runoff although all 3 vessels were small with poor flow into the foot and small vessel disease in the foot and ankle.  -Patient being followed by ID and cultures with abundant MRSA within mixed organisms and stenotrophomonas.  -Patient was on Augmentin and transitioned to Bactrim DS to complete a 7-day course of treatment.  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 -Hematology consulted and case discussed with rheumatology and hematology via secure chat and patient seen in consultation as well by Dr. Orlie Dakin and patient status post infusion of rituximab 09/05/2020 with recommendations from rheumatology to repeat dosing in 2 weeks as outpatient which will be arranged for September 19, 2020.   -In discussion with ID and hematology given the COVID-vaccine just prior to rituximab would not be helpful nor effective per ID and options are Evushield after rituximab and vaccine later on. -Supportive care -Patient now with right foot pain with absent dorsalis pedis pulses with concern for ischemic foot.  Vascular surgery reconsulted and assessed the patient.  See #7. -Rheumatology recommended prednisone 60 mg daily with gradual tapering in the outpatient setting. -Appreciate rheumatology, hematology, ID, vascular input and recommendations. -Follow.  2.  Depression -Stable. -Patient tearful and understandable with current hospitalization with ischemic foot. -It is noted that patient stopped taking her home dose of bupropion, Cymbalta, ariprazole. -Continue Celexa.   -Outpatient follow-up.    3.  Nicotine dependence -Cessation stressed to patient and patient very motivated to quit a lot of current hospitalization and ischemic foot.   -Nicotine patch.   4.  Iron deficiency anemia -Continue oral iron supplementation.   -H&H stable. -May benefit from IV iron in the future which may be done in the outpatient setting.  5.  Thrombocytosis -Likely reactive. -Trending down.  6.  Diarrhea -Patient denies any further diarrhea after addition of Questran in addition to Imodium.   -Discontinue scheduled Imodium and Questran.   -Lomotil as needed.   7.  Right ischemic foot -Patient with complaints of severe right foot pain starting around 5 AM on 09/06/2020, and described the pain as a burning pain similar to like a frostbite.  Unable to palpate dorsalis pedis pulses by hand and  with a Doppler. -Concern for ischemic foot.   -Patient still with significant ongoing pain, inability to feel DP pulses, right foot is cold.  Right foot is bluish/mottled appearance -Patient seen in consultation by vascular surgery, Dr. Myra Gianotti who assessed patient and could feel pulses above the ankle/wound area but  no DP pulses and concern about possible ischemic foot. -Dr. Myra Gianotti recommended heparin bolus and full dose heparin.  -Patient underwent CT angiogram of abdominal aorta with iliofemoral runoff which showed irregular, pedunculated aortic plaque/thrombus of the lower infrarenal abdominal aorta, potential source of embolus, plaque/thrombus contributes to 50% cross-sectional diameter narrowing of the lower infrarenal abdominal aorta.  Aortic atherosclerosis.  Minimal bilateral iliac artery disease.  No significant femoropopliteal disease.  Absent contrast column within the distal right AT, PT and peroneal arteries just above the ankle, favored to represent distal embolization, however slow arrival of contrast bolus could have this appearance.   -Fasting lipid panel with LDL of 118  -Patient currently receiving thrombolytic therapy in the ICU. -Continue Lipitor, heparin, pain management. -Vascular surgery following and appreciate input and recommendations.  8.  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 -Patient being followed by vascular surgery we will determine further recommendations for possible percutaneous or surgical thrombectomy. -Continue IV heparin. -Currently receiving thrombolytic therapy for ischemic foot. -Per vascular surgery.   DVT prophylaxis: Heparin Code Status: Full Family Communication: Updated patient.  No family at bedside. Disposition:   Status is: Inpatient  Remains inpatient appropriate because:IV treatments appropriate due to intensity of illness or inability to take PO  Dispo: The patient is from: Home              Anticipated d/c is to: Home              Patient currently is not medically stable to d/c.   Difficult to place patient No       Consultants:  Vascular surgery: Dr. Wyn Quaker 08/29/2020 Podiatry: Dr. Allena Katz 08/30/2020 Infectious disease: Dr. Avon Gully  09/01/2020 Rheumatology: Dr. Gavin Potters 09/01/2020 Hematology/oncology: Dr. Orlie Dakin Vascular surgery: Dr. Myra Gianotti 09/06/2020    Procedures:  CT chest 09/02/2020 Chest x-ray 08/28/2020 Plain films of the right ankle 08/28/2020 Lower extremity Doppler 08/29/2020 Ultrasound guidance for vascular access left femoral artery/catheter placement into right SFA from left femoral approach/aortogram and selective right lower extremity angiogram/StarClose closure device left femoral artery per Dr. Wyn Quaker 09/01/2020 CT angiogram of abdominal aorta and iliofemoral runoff 09/06/2020 3.  Ultrasound-guided access, right femoral artery, right lower extremity runoff, intra-arterial injection of tPA, initiation of thrombolytic therapy by Dr. Myra Gianotti 09/07/2020   Antimicrobials:  Augmentin 09/01/2020>>>>> 09/03/2020 Bactrim DS 09/03/2020>>>> IV Rocephin 08/29/2020>>>> 09/01/2020 IV Flagyl 08/29/2020>>>> 09/01/2020 IV vancomycin 08/29/2020>>>> 08/31/2020    Subjective: In ICU currently receiving tPA.  Laying in bed.  Tearful.  Still with complaints of significant pain in the right foot.  States cannot feel me touching her foot.  No chest pain.  No shortness of breath.  Patient states having surgery this afternoon to remove aortic thrombus.  Patient denies any further diarrhea.  Objective: Vitals:   09/08/20 0600 09/08/20 0800 09/08/20 0801 09/08/20 0802  BP: (!) 104/59   (!) 104/50  Pulse: 74 75 81 70  Resp: Temp:  98.3 F (36.8 C)    TempSrc:  Oral    SpO2: 99% 100% 99% 99%  Weight:      Height:  Intake/Output Summary (Last 24 hours) at 09/08/2020 0926 Last data filed at 09/08/2020 0800 Gross per 24 hour  Intake 1874.92 ml  Output 1205 ml  Net 669.92 ml    Filed Weights   08/28/20 1927 09/01/20 0937 09/07/20 1332  Weight: 58.5 kg 58.5 kg 57.9 kg    Examination:  General exam: NAD Respiratory system: Fine diffuse crackles in the bases.  Anterior lung fields clear.  No wheezing.  No  rhonchi.  Speaking in full sentences.  Cardiovascular system: RRR no murmurs rubs or gallops.  No JVD.  No lower extremity edema.  Gastrointestinal system: Abdomen is soft, nontender, nondistended, positive bowel sounds.  No rebound.  No guarding.  Central nervous system: Alert and oriented.  No focal neurological deficits.   Extremities: Symmetric 5 x 5 power.  Right foot cold.  Bluish discoloration/Motrin.  Unable to palpate DP pulses.  Able to move right great toe and second toe.   Skin: Large black ulcer noted over right malleolus.  No significant discharge noted.  Psychiatry: Judgement and insight appear normal. Mood & affect appropriate.     Data Reviewed: I have personally reviewed following labs and imaging studies  CBC: Recent Labs  Lab 09/04/20 0436 09/05/20 0553 09/06/20 0512 09/07/20 0426 09/07/20 1347 09/07/20 1929 09/08/20 0154 09/08/20 0422 09/08/20 0759  WBC 14.2* 12.7* 6.5 15.4* 12.2* 14.7* 16.2* 15.6* 14.4*  NEUTROABS 12.5* 9.3* 5.5 10.6*  --   --   --   --   --   HGB 10.2* 10.2* 10.8* 11.6* 11.1* 11.6* 10.8* 10.5* 11.0*  HCT 32.3* 32.7* 34.0* 36.3 35.5* 36.9 34.0* 33.6* 35.5*  MCV 76.5* 75.5* 75.6* 75.0* 76.0* 76.2* 76.7* 75.8* 76.0*  PLT 470* 488* 401* 397 355 348 298 291 308     Basic Metabolic Panel: Recent Labs  Lab 09/04/20 0436 09/05/20 0553 09/06/20 0512 09/07/20 0426 09/08/20 0422  NA 138 137 136 135 135  K 4.0 4.3 3.9 3.4* 4.0  CL 107 105 102 99 104  CO2 24 26 27 25 26   GLUCOSE 125* 107* 121* 93 107*  BUN 21* 25* 13 20 11   CREATININE 0.46 0.58 0.46 0.64 0.43*  CALCIUM 8.3* 8.4* 8.1* 8.0* 7.9*  MG 2.3  --  2.2  --  2.0     GFR: Estimated Creatinine Clearance: 68.8 mL/min (A) (by C-G formula based on SCr of 0.43 mg/dL (L)).  Liver Function Tests: No results for input(s): AST, ALT, ALKPHOS, BILITOT, PROT, ALBUMIN in the last 168 hours.   CBG: Recent Labs  Lab 09/07/20 1338  GLUCAP 143*     Recent Results (from the past 240  hour(s))  MRSA PCR Screening     Status: None   Collection Time: 08/30/20  1:32 PM  Result Value Ref Range Status   MRSA by PCR NEGATIVE NEGATIVE Final    Comment:        The GeneXpert MRSA Assay (FDA approved for NASAL specimens only), is one component of a comprehensive MRSA colonization surveillance program. It is not intended to diagnose MRSA infection nor to guide or monitor treatment for MRSA infections. Performed at [facility], [address] [place], [address] [place]   Urine culture     Status: None   Collection Time: 09/01/20  9:15 AM   Specimen: In/Out Cath Urine  Result Value Ref Range Status   Specimen Description   Final    IN/OUT CATH URINE Performed at [facility], [address], [place], [address]  [address]    Special Requests   Final    NONE Performed at [facility], [address] [place], [place] [address]    Culture   Final    NO GROWTH Performed at [facility], [address] [place], [place] [address]    Report Status 09/02/2020 FINAL  Final  Aerobic Culture w Gram Stain (superficial specimen)     Status: None   Collection Time: 09/01/20  2:40 PM   Specimen: Wound  Result Value Ref Range Status   Specimen Description   Final    WOUND Performed at [facility], [address] [place], [place] [address]    Special Requests   Final    NONE Performed at [facility], [address] [place], [place] [address]    Gram Stain   Final    RARE WBC PRESENT, PREDOMINANTLY PMN FEW GRAM NEGATIVE RODS FEW GRAM POSITIVE COCCI IN CLUSTERS    Culture   Final    ABUNDANT METHICILLIN RESISTANT STAPHYLOCOCCUS AUREUS WITHIN MIXED ORGANISMS Performed at [facility], [address] [place], [place] [address]    Report Status 09/04/2020 FINAL  Final   Organism ID, Bacteria METHICILLIN RESISTANT STAPHYLOCOCCUS AUREUS  Final      Susceptibility   Methicillin resistant staphylococcus aureus  - MIC*    CIPROFLOXACIN <=0.5 SENSITIVE Sensitive     ERYTHROMYCIN <=0.25 SENSITIVE Sensitive     GENTAMICIN <=0.5 SENSITIVE Sensitive     OXACILLIN >=4 RESISTANT Resistant     TETRACYCLINE >=16 RESISTANT Resistant     VANCOMYCIN <=0.5 SENSITIVE Sensitive     TRIMETH/SULFA <=10 SENSITIVE Sensitive     CLINDAMYCIN <=0.25 SENSITIVE Sensitive     RIFAMPIN <=0.5 SENSITIVE Sensitive     Inducible Clindamycin NEGATIVE Sensitive     * ABUNDANT METHICILLIN RESISTANT STAPHYLOCOCCUS AUREUS          Radiology Studies: CT ANGIO AO+BIFEM W & OR WO CONTRAST  Result Date: 09/07/2020 CLINICAL DATA:  50 year old female with a history pain and numbness right foot at 5 a.m. EXAM: CT ANGIOGRAPHY OF ABDOMINAL AORTA WITH ILIOFEMORAL RUNOFF TECHNIQUE: Multidetector CT imaging of the abdomen, pelvis and lower extremities was performed using the standard protocol during bolus administration of intravenous contrast. Multiplanar CT image reconstructions and MIPs were obtained to evaluate the vascular anatomy. CONTRAST:  OMNIPAQUE IOHEXOL 350 MG/ML SOLN COMPARISON:  None. FINDINGS: VASCULAR Aorta: Distal thoracic aorta without significant atherosclerotic changes. Diameter at the hiatus 19 mm. Mild calcified plaque at the level of the IMA origin. Pedunculated soft plaque/thrombus at the right aspect of the infrarenal abdominal aorta, just above the bifurcation. Plaque extends 50% of the cross-sectional diameter of the aorta. Celiac: Patent, with no significant atherosclerotic changes. SMA: Patent, with no significant atherosclerotic changes. Renals: - Right: Right renal artery patent. - Left: Left renal artery patent. IMA: Inferior mesenteric artery is patent. Right lower extremity: Mild mixed calcified and soft plaque of the common iliac artery. No high-grade stenosis. 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 Hypogastric artery is  patent. External iliac artery is patent with no significant atherosclerosis. Common femoral artery patent with no significant atherosclerosis. Profunda femoris is patent. There is an accessory thigh branch from the proximal SFA. SFA is patent from the origin through the popliteal artery with no significant atherosclerosis. Typical arrangement of the trifurcation. Anterior tibial artery patent from the origin to the distal  third. There is absent contrast at the ankle within the anterior tibial artery. Tibioperoneal trunk is patent without significant atherosclerosis. Posterior tibial artery patent from the origin to the distal third. There is abrupt contrast cut off in the distal third. Peroneal artery patent from the origin to the distal third where there is abrupt contrast cut off distally. There is also a cut off of the anterior communicating artery from the peroneal artery. Left lower extremity: Mild mixed soft and calcified plaque of the common iliac artery. No high-grade stenosis or occlusion. No pedunculated plaque or ulcerated plaque. Hypogastric artery is patent. No significant atherosclerotic changes of the left external iliac artery. Common femoral artery patent. Closure device on the anterior wall of the left common femoral artery. Profunda femoris and the thigh branches patent. SFA is patent from the origin through the popliteal artery. Typical trifurcation arrangement of the left tibial arteries. Anterior tibial artery is patent from the origin into the foot. Tibioperoneal trunk is patent. Posterior tibial artery is patent from the origin into the foot. Peroneal artery is patent from the origin into the foot. Veins: Unremarkable appearance of the venous system. Review of the MIP images confirms the above findings. NON-VASCULAR Lower chest: Redemonstration of subpleural reticular changes at the lung bases, which are likely extension weighted from the prior CT given the low lung volumes and respiratory  motion. No acute finding at the lung bases. Hepatobiliary: Unremarkable appearance of the liver. Unremarkable gall bladder. Pancreas: Unremarkable. Spleen: Unremarkable. Adrenals/Urinary Tract: - Right adrenal gland: Unremarkable - Left adrenal gland: Unremarkable. - Right kidney: No hydronephrosis, nephrolithiasis, inflammation, or ureteral dilation. No focal lesion. - Left Kidney: No hydronephrosis, nephrolithiasis, inflammation, or ureteral dilation. No focal lesion. - Urinary Bladder: Unremarkable. Stomach/Bowel: - Stomach: Unremarkable. - Small bowel: Unremarkable - Appendix: Normal. - Colon: Moderate stool burden. No evidence of obstruction. No inflammatory changes. Minimal diverticular disease of the sigmoid colon. Lymphatic: No adenopathy. Mesenteric: No free fluid or air. No mesenteric adenopathy. Reproductive: Hysterectomy Other: Subcutaneous gas in the right lower quadrant into separate location, likely injection sequela. Small fat containing umbilical hernia. Musculoskeletal: Mild degenerative changes of the spine. No acute displaced fracture. Mild degenerative changes of the hips. IMPRESSION: Irregular, pedunculated aortic plaque/thrombus of the lower infrarenal abdominal aorta, potentially a source of embolus. The plaque/thrombus contributes to 50% cross-sectional diameter narrowing of the lower infrarenal abdominal aorta. Aortic Atherosclerosis (ICD10-I70.0). Minimal bilateral iliac arterial disease. No significant femoropopliteal disease. There is absent contrast column within the distal right AT, PT, and peroneal arteries, just above the ankle, favored to represent distal embolization, however, slow arrival of the contrast bolus could have this appearance. The above preliminary results were discussed by telephone at the time of interpretation on 09/07/2020 at 9:29 am with Dr. Myra Gianotti. Ancillary findings as above. Signed, Yvone Neu. Reyne Dumas, RPVI [facility]  [facility] Radiology Electronically Signed   By: Gilmer Mor D.O.   On: 09/07/2020 09:31   PERIPHERAL VASCULAR CATHETERIZATION  Result Date: 09/07/2020 Images from the original result were not included. Patient name: Alexandra [name] MRN: [id number] DOB: [birth year] Sex: female 09/07/2020 Pre-operative Diagnosis: Ischemic right foot Post-operative diagnosis:  Same Surgeon:  Durene Cal Procedure Performed:  1.  Antegrade ultrasound-guided access, right femoral artery  2.  Right lower extremity runoff  3.  Intra-arterial injection of tPA  4.  Initiation of thrombolytic therapy  5.  Conscious sedation, 27 minutes  \ Indications: This is a 50 year old female with nonhealing right leg  ulcer who has been diagnosed with vasculitis.  She underwent angiography a week ago that showed poor perfusion onto her foot.  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  Yesterday she developed acute changes to her foot including pain and numbness.  She did have motor function.  Because this was felt to be small vessel disease, I tried IV heparin, because she did have Doppler signals down to her ulcer, but not onto her foot.  I also got a CT angiogram.  This revealed a aortic thrombus.  The patient did not improve overnight with IV heparin.  I discussed surgical thrombectomy versus thrombolytic therapy.  Because surgical thrombectomy would not address her microcirculation, I felt that initiation of lytic therapy provided the best chance for limb salvage.  She does have an aortic thrombus.  I felt that this was at high risk for being dislodged if I obtain contralateral access.  Therefore I discussed getting antegrade access.  I told her that we would need to address her aortic thrombus in the near future.  The last thing I wanted to do was to dislodge her thrombus and commit her to a surgical thrombectomy which would make thrombolytic therapy not an option. Procedure:  The patient was identified in the holding area  and taken to room 8.  The patient was then placed supine on the table and prepped and draped in the usual sterile fashion.  A time out was called.  Conscious sedation was administered with the use of IV fentanyl and Versed under continuous physician and nurse monitoring.  Heart rate, blood pressure, and oxygen saturation were continuously monitored.  Total sedation time was 27 minutes.  Ultrasound was used to evaluate the right common femoral artery.  It was patent .  A digital ultrasound image was acquired.  A micropuncture needle was used to access the right common femoral artery under ultrasound guidance in antegrade fashion.  A 018 wire was inserted followed by micropuncture sheath.  A contrast injection was performed and then I manipulated the wire into the superficial femoral artery.  The sheath was then inserted into the superficial femoral artery.  A 035 wire was then placed followed by a 5 [place] sheath.  Contrast injections were then performed Findings:  Right Lower Extremity: The common femoral profundofemoral, superficial femoral, and popliteal artery were widely patent.  There did appear to be some spasm within the popliteal artery from the wire.  All 3 tibial vessels were patent down to the lower leg where they occluded.  There was minimal opacification of the vessels across the ankle onto the foot. Intervention: After the images were acquired, I exchanged the 5 French sheath for a 6 French 45 cm sheath.  A 20 cm infusion length catheter was then placed.  10 mg of tPA was then administered and a pulse spray fashion.  Next, the tPA infusion was initiated through the infusion catheter, and heparin was 1 through the sheath.  This sheath was sutured into position.  The patient be taken to the ICU for overnight tPA administration Impression:  #1  Initiation of thrombolytic therapy  #2  Minimal opacification of the tibial vessels beginning at the ankle  #3  No significant atherosclerotic changes noted within  the common femoral profundofemoral, superficial femoral, popliteal artery, and proximal tibial vessels V. Durene CalWells Brabham, M.D., [facility] [place] Office: [phone] Pager:  [phone]        Scheduled Meds:  vitamin C  250  mg Oral BID   atorvastatin  40 mg Oral Daily   Chlorhexidine Gluconate Cloth  6 each Topical Daily   cholecalciferol  1,000 Units Oral Daily   cholestyramine light  4 g Oral BID   citalopram  20 mg Oral Daily   feeding supplement  1 Container Oral TID BM   ferrous gluconate  324 mg Oral BID WC   gabapentin  400 mg Oral TID   loperamide  4 mg Oral TID   nicotine  21 mg Transdermal Daily   nutrition supplement (JUVEN)  1 packet Oral BID BM   pantoprazole  40 mg Oral Daily   predniSONE  60 mg Oral QAC breakfast   sodium chloride flush  3 mL Intravenous Q12H   sulfamethoxazole-trimethoprim  1 tablet Oral Q12H   Continuous Infusions:  sodium chloride Stopped (08/29/20 1709)   alteplase (LIMB ISCHEMIA) 10 mg in normal saline (0.02 mg/mL) infusion 1 mg/hr (09/08/20 0800)   heparin 850 Units/hr (09/08/20 0800)     LOS: 11 days    Time spent: 40 minutes    Ramiro Harvest, MD [facility]   To contact the attending provider between 7A-7P or the covering provider during after hours 7P-7A, please log into the web site [email or web address] and access using universal  password for that web site. If you do not have the password, please call the hospital operator.  09/08/2020, 9:26 AM

## 2020-09-08 NOTE — Interval H&P Note (Signed)
History and Physical Interval Note:  09/08/2020 1:25 PM  Alexandra [name]  has presented today for surgery, with the diagnosis of aortic thrombis.  The various methods of treatment have been discussed with the patient and family. After consideration of risks, benefits and other options for treatment, the patient has consented to  Procedure(s): ENDOVASCULAR STENT GRAFT INSERTION (N/A) as a surgical intervention.  The patient's history has been reviewed, patient examined, no change in status, stable for surgery.  I have reviewed the patient's chart and labs.  Questions were answered to the patient's satisfaction.     Festus Barren

## 2020-09-08 NOTE — Anesthesia Procedure Notes (Signed)
Procedure Name: Intubation Date/Time: 09/08/2020 2:16 PM Performed by: Irving Burton, CRNA Pre-anesthesia Checklist: Patient identified, Patient being monitored, Timeout performed, Emergency Drugs available and Suction available Patient Re-evaluated:Patient Re-evaluated prior to induction Oxygen Delivery Method: Circle system utilized Preoxygenation: Pre-oxygenation with 100% oxygen Induction Type: IV induction Ventilation: Mask ventilation without difficulty Laryngoscope Size: 3 and McGraph Grade View: Grade I Tube type: Oral Tube size: 7.0 mm Number of attempts: 1 Airway Equipment and Method: Stylet Placement Confirmation: ETT inserted through vocal cords under direct vision, positive ETCO2 and breath sounds checked- equal and bilateral Secured at: 21 cm Tube secured with: Tape Dental Injury: Teeth and Oropharynx as per pre-operative assessment

## 2020-09-08 NOTE — Progress Notes (Signed)
ANTICOAGULATION CONSULT NOTE  Pharmacy Consult for heparin infusion Indication:  VTE treatment  No Known Allergies  Patient Measurements: Height: 5\' 3"  (160 cm) Weight: 57.9 kg (127 lb 10.3 oz) IBW/kg (Calculated) : 52.4  Vital Signs: Temp: 98.4 F (36.9 C) (06/27 0000) Temp Source: Oral (06/27 0000) BP: 118/58 (06/27 0300) Pulse Rate: 85 (06/27 0300)  Labs: Recent Labs    09/05/20 0553 09/06/20 0512 09/06/20 2039 09/07/20 0426 09/07/20 0958 09/07/20 1347 09/07/20 1929 09/08/20 0154  HGB 10.2* 10.8*  --  11.6*  --  11.1* 11.6* 10.8*  HCT 32.7* 34.0*  --  36.3  --  35.5* 36.9 34.0*  PLT 488* 401*  --  397  --  355 348 298  HEPARINUNFRC  --   --    < > 0.33   < > 0.13* 0.20* 0.14*  CREATININE 0.58 0.46  --  0.64  --   --   --   --    < > = values in this interval not displayed.     Estimated Creatinine Clearance: 68.8 mL/min (by C-G formula based on SCr of 0.64 mg/dL).   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 She was hospitalized 1 month ago and was discharged home on doxycycline which she completed without any significant improvement.  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 Pt underwent angiogram 09/01/20 without intervention. Pharmacy has been consulted for heparin dosing and monitoring. Pt might get  repeat angiogram Monday (09/08/20).  6/25 2039 HL 0.78 6/26 0426 HL 0.33 6/26 0958 HL 0.20  6/26 1400-- Alteplase infusion started -- Heparin decreased to 800 units/hr by vascular. New goal 0.2-0.5 6/27 0154 HL 0.14   Goal of Therapy:  Heparin level 0.2-0.5 units/ml Monitor platelets by anticoagulation protocol: Yes   Plan:  6/27:  HL @ 0154 = 0.14 Will increase drip rate to 850 units/hr.  Will recheck HL 6 hrs after rate change.   Estie Sproule D, PharmD 09/08/2020,4:06 AM

## 2020-09-08 NOTE — Progress Notes (Signed)
ANTICOAGULATION CONSULT NOTE  Pharmacy Consult for heparin infusion Indication:  VTE treatment  No Known Allergies  Patient Measurements: Height: 5\' 3"  (160 cm) Weight: 57 kg (125 lb 10.6 oz) IBW/kg (Calculated) : 52.4  Vital Signs: Temp: 98.8 F (37.1 C) (06/27 1830) Temp Source: Temporal (06/27 1200) BP: 109/61 (06/27 1830) Pulse Rate: 93 (06/27 1830)  Labs: Recent Labs    09/06/20 0512 09/06/20 2039 09/07/20 0426 09/07/20 0958 09/07/20 1929 09/08/20 0154 09/08/20 0422 09/08/20 0759 09/08/20 1039  HGB 10.8*  --  11.6*   < > 11.6* 10.8* 10.5* 11.0*  --   HCT 34.0*  --  36.3   < > 36.9 34.0* 33.6* 35.5*  --   PLT 401*  --  397   < > 348 298 291 308  --   HEPARINUNFRC  --    < > 0.33   < > 0.20* 0.14*  --   --  0.12*  CREATININE 0.46  --  0.64  --   --   --  0.43*  --   --    < > = values in this interval not displayed.     Estimated Creatinine Clearance: 68.8 mL/min (A) (by C-G formula based on SCr of 0.43 mg/dL (L)).   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 She was hospitalized 1 month ago and was discharged home on doxycycline which she completed without any significant improvement.  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 Pt underwent angiogram 09/01/20 without intervention. Pharmacy has been  consulted for heparin dosing and monitoring. H&H, platelets stable 6/27: mechanical thrombectomy of aortic thrombus and further stenting/revascularizing of right lower limb  6/27  Goal of Therapy:  Heparin level 0.2-0.5 units/ml Monitor platelets by anticoagulation protocol: Yes   Plan:  Continue to target lower HL of 0.2-0.5 per vascular. Resume heparin at last dose adjustment prior to vascular procedure at 900 units/hr Recheck HL 6 hrs Repeat CBC in am  7/27, PharmD, BCPS 09/08/2020,7:10 PM

## 2020-09-08 NOTE — Anesthesia Postprocedure Evaluation (Signed)
Anesthesia Post Note  Patient: DARLETTE DUBOW  Procedure(s) Performed: ENDOVASCULAR STENT GRAFT INSERTION  Patient location during evaluation: PACU Anesthesia Type: General Level of consciousness: awake and alert Pain management: pain level controlled Vital Signs Assessment: post-procedure vital signs reviewed and stable Respiratory status: spontaneous breathing, nonlabored ventilation, respiratory function stable and patient connected to nasal cannula oxygen Cardiovascular status: blood pressure returned to baseline and stable Postop Assessment: no apparent nausea or vomiting Anesthetic complications: no   No notable events documented.   Last Vitals:  Vitals:   09/08/20 1715 09/08/20 1730  BP: 117/66 110/65  Pulse: 100 (!) 101  Resp: 13 17  Temp:    SpO2: 99% 96%    Last Pain:  Vitals:   09/08/20 1719  TempSrc:   PainSc: 6                  Yevette Edwards

## 2020-09-08 NOTE — Transfer of Care (Signed)
Immediate Anesthesia Transfer of Care Note  Patient: Alexandra [name]  Procedure(s) Performed: ENDOVASCULAR STENT GRAFT INSERTION  Patient Location: PACU  Anesthesia Type:General  Level of Consciousness: sedated  Airway & Oxygen Therapy: Patient Spontanous Breathing and Patient connected to face mask oxygen  Post-op Assessment: Report given to RN and Post -op Vital signs reviewed and stable  Post vital signs: Reviewed and stable  Last Vitals:  Vitals Value Taken Time  BP 107/61 09/08/20 1645  Temp    Pulse 96 09/08/20 1647  Resp 7 09/08/20 1647  SpO2 100 % 09/08/20 1647  Vitals shown include unvalidated device data.  Last Pain:  Vitals:   09/08/20 1349  TempSrc:   PainSc: 10-Worst pain ever      Patients Stated Pain Goal: 0 (09/08/20 1300)  Complications: No notable events documented.

## 2020-09-08 NOTE — Anesthesia Preprocedure Evaluation (Signed)
Anesthesia Evaluation  Patient identified by MRN, date of birth, ID band Patient awake    Reviewed: Allergy & Precautions, NPO status , Patient's Chart, lab work & pertinent test results  History of Anesthesia Complications Negative for: history of anesthetic complications  Airway Mallampati: III  TM Distance: <3 FB Neck ROM: full    Dental  (+) Chipped   Pulmonary neg shortness of breath, Current Smoker,    Pulmonary exam normal        Cardiovascular Exercise Tolerance: Good (-) angina+ Peripheral Vascular Disease  (-) Past MI and (-) DOE Normal cardiovascular exam     Neuro/Psych PSYCHIATRIC DISORDERS  Neuromuscular disease    GI/Hepatic negative GI ROS, Neg liver ROS, neg GERD  ,  Endo/Other  negative endocrine ROS  Renal/GU      Musculoskeletal   Abdominal   Peds  Hematology negative hematology ROS (+)   Anesthesia Other Findings ANCA  Past Medical History: 2013: Breast discharge     Comment:  present X 3 years  Left only multiple ducts per pt No date: Depression  Past Surgical History: No date: ABDOMINAL HYSTERECTOMY 2006: BREAST CYST EXCISION; Left 09/01/2020: LOWER EXTREMITY ANGIOGRAPHY; Right     Comment:  Procedure: Lower Extremity Angiography;  Surgeon: Annice Needy, MD;  Location: [facility]IVE [facility];  Service:               Cardiovascular;  Laterality: Right; 09/07/2020: LOWER EXTREMITY INTERVENTION; Right     Comment:  Procedure: LOWER EXTREMITY INTERVENTION;  Surgeon:               Nada Libman, MD;  Location: [facility] CV [facility];                Service: Cardiovascular;  Laterality: Right;  BMI    Body Mass Index: 22.61 kg/m      Reproductive/Obstetrics negative OB ROS                             Anesthesia Physical Anesthesia Plan  ASA: 3  Anesthesia Plan: General ETT   Post-op Pain Management:    Induction: Intravenous  PONV Risk  Score and Plan: Ondansetron, Dexamethasone, Midazolam and Treatment may vary due to age or medical condition  Airway Management Planned: Oral ETT  Additional Equipment:   Intra-op Plan:   Post-operative Plan: Extubation in OR  Informed Consent: I have reviewed the patients History and Physical, chart, labs and discussed the procedure including the risks, benefits and alternatives for the proposed anesthesia with the patient or authorized representative who has indicated his/her understanding and acceptance.     Dental Advisory Given  Plan Discussed with: Anesthesiologist, CRNA and Surgeon  Anesthesia Plan Comments: (Patient consented for risks of anesthesia including but not limited to:  - adverse reactions to medications - damage to eyes, teeth, lips or other oral mucosa - nerve damage due to positioning  - sore throat or hoarseness - Damage to heart, brain, nerves, lungs, other parts of body or loss of life  Patient voiced understanding.)        Anesthesia Quick Evaluation

## 2020-09-09 ENCOUNTER — Encounter: Payer: Self-pay | Admitting: Vascular Surgery

## 2020-09-09 ENCOUNTER — Encounter: Payer: Self-pay | Admitting: Oncology

## 2020-09-09 ENCOUNTER — Inpatient Hospital Stay
Admit: 2020-09-09 | Discharge: 2020-09-09 | Disposition: A | Discharge status: Home / Self Care | Payer: Medicaid Other | Attending: Internal Medicine | Admitting: Internal Medicine

## 2020-09-09 DIAGNOSIS — S81801D Unspecified open wound, right lower leg, subsequent encounter: Secondary | ICD-10-CM

## 2020-09-09 LAB — BASIC METABOLIC PANEL
Anion gap: 6 (ref 5–15)
BUN: 7 mg/dL (ref 6–20)
CO2: 26 mmol/L (ref 22–32)
Calcium: 8 mg/dL — ABNORMAL LOW (ref 8.9–10.3)
Chloride: 105 mmol/L (ref 98–111)
Creatinine, Ser: 0.41 mg/dL — ABNORMAL LOW (ref 0.44–1.00)
GFR, Estimated: >60 mL/min (ref >60)
Glucose, Bld: 157 mg/dL — ABNORMAL HIGH (ref 70–99)
Potassium: 4 mmol/L (ref 3.5–5.1)
Sodium: 137 mmol/L (ref 135–145)

## 2020-09-09 LAB — CBC WITH DIFFERENTIAL/PLATELET
Abs Immature Granulocytes: 0.1 10*3/uL — ABNORMAL HIGH (ref 0.00–0.07)
Basophils Absolute: 0 10*3/uL (ref 0.0–0.1)
Basophils Relative: 0 %
Eosinophils Absolute: 0 10*3/uL (ref 0.0–0.5)
Eosinophils Relative: 0 %
HCT: 28.5 % — ABNORMAL LOW (ref 36.0–46.0)
Hemoglobin: 9.2 g/dL — ABNORMAL LOW (ref 12.0–15.0)
Immature Granulocytes: 1 %
Lymphocytes Relative: 5 %
Lymphs Abs: 0.7 10*3/uL (ref 0.7–4.0)
MCH: 24.2 pg — ABNORMAL LOW (ref 26.0–34.0)
MCHC: 32.3 g/dL (ref 30.0–36.0)
MCV: 75 fL — ABNORMAL LOW (ref 80.0–100.0)
Monocytes Absolute: 0.4 10*3/uL (ref 0.1–1.0)
Monocytes Relative: 3 %
Neutro Abs: 12.5 10*3/uL — ABNORMAL HIGH (ref 1.7–7.7)
Neutrophils Relative %: 91 %
Platelets: 262 10*3/uL (ref 150–400)
RBC: 3.8 MIL/uL — ABNORMAL LOW (ref 3.87–5.11)
RDW: 18.4 % — ABNORMAL HIGH (ref 11.5–15.5)
WBC: 13.8 10*3/uL — ABNORMAL HIGH (ref 4.0–10.5)
nRBC: 0 % (ref 0.0–0.2)

## 2020-09-09 LAB — HEPARIN LEVEL (UNFRACTIONATED)
Heparin Unfractionated: 0.22 IU/mL — ABNORMAL LOW (ref 0.30–0.70)
Heparin Unfractionated: 0.28 IU/mL — ABNORMAL LOW (ref 0.30–0.70)

## 2020-09-09 LAB — ANTITHROMBIN III: AntiThromb III Func: 93 % (ref 75–120)

## 2020-09-09 LAB — MAGNESIUM: Magnesium: 2.1 mg/dL (ref 1.7–2.4)

## 2020-09-09 MED ORDER — HYDROCODONE-ACETAMINOPHEN 5-325 MG PO TABS
2.0000 | ORAL_TABLET | ORAL | Status: DC | PRN
  Administered 2020-09-09 – 2020-09-11 (×8): 2 via ORAL
  Filled 2020-09-09 (×8): qty 2

## 2020-09-09 MED ORDER — CHLORHEXIDINE GLUCONATE CLOTH 2 % EX PADS: 6.0000 | MEDICATED_PAD | Freq: Once | CUTANEOUS | Status: DC

## 2020-09-09 MED ORDER — HYDROMORPHONE HCL 1 MG/ML IJ SOLN
2.0000 mg | INTRAMUSCULAR | Status: DC | PRN
Start: 2020-09-09 — End: 2020-09-11
  Administered 2020-09-09 – 2020-09-11 (×10): 3 mg via INTRAVENOUS
  Filled 2020-09-09 (×10): qty 3

## 2020-09-09 MED ORDER — CEFAZOLIN SODIUM-DEXTROSE 2-4 GM/100ML-% IV SOLN: 2.0000 g | INTRAVENOUS | Status: AC

## 2020-09-09 NOTE — Progress Notes (Addendum)
PROGRESS NOTE    Alexandra [name]  ZJQ:[id number] DOB: Jul 20, 1970 DOA: 08/28/2020 PCP: [facility]    Chief Complaint  Patient presents with   Wound Check    Brief Narrative:  HPI was taken from Dr. Joylene Igo: Alexandra [name] is a 51 y.o. 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 Patient states that she has had this ulcer for 3 months, it is nonhealing and appears to be extending to the anterior surface of her right leg.  She rates her pain a 10 x 10 in intensity at its worst and pain is usually at rest.  When she is ambulating or standing on her feet she does not feel any pain. She has increased purulent drainage from the wound but denies having any fever or chills.  She has felt unwell and complains of nausea without any emesis or abdominal pain. She was hospitalized 1 month ago and was discharged home on doxycycline which she completed without any significant improvement.  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  Patient was referred to rheumatology and has an appointment scheduled for July 7. She denies having any chest pain,  no headache, no cough, no abdominal pain, no changes in her bowel habits, no dizziness, no lightheadedness no urinary symptoms or changes in her bowel habits. Labs showed sodium 138, potassium 3.5, chloride 105, bicarb 25, glucose 88, BUN 15, creatinine 0.75, calcium 8.7, alkaline phosphatase 85, albumin 3.3, AST 16, ALT 11, total protein 7.3, lactic acid 1.4, white count 9.5, hemoglobin 10.7, hematocrit 35.0, MCV 77.6, RDW 18.2, platelet count  499, PT 13.9, INR 1.1 Respiratory viral panel is negative Right ankle x-ray shows diffuse soft tissue swelling or more focal ulceration over the lateral malleolus and distal fibular diaphysis where some early periosteal reaction is suggested. May implicate reactive periostitis or early osteomyelitis. Chest x-ray reviewed by me shows coarse reticular and patchy opacities in the lung bases favor superimposed edematous change given vascular congestion, septal thickening and cuffing. Underlying infection is difficult to fully exclude. Low volumes with additional features of atelectasis. Twelve-lead EKG reviewed by me shows sinus rhythm.       ED Course: Patient is a 50 year old female who presents to the ER for evaluation of pain and increased purulent drainage from a chronic ulcer over her right ankle. Imaging shows early periosteal reaction which could be reactive periostitis versus early osteomyelitis. She will be admitted to the hospital for further evaluation.     Hospital course from Dr. Mayford Knife 6/17-6/21/22: Pt was found to have nonhealing right lateral malleolus ulcer & possible cellulitis. Pt has a positive RF and P-ANCA and there is a concern for vasculitis. Rheumatology and ID are following the pt. Other serologies were sent as per rheum (see rheum's note). Pt was started on IV steroids x 3 days and CT chest ordered to further evaluate reticular nodular lesions in the lungs was ordered as per rheum.Of note, pt is now on po Bactrim as per ID and podiatry no longer recommends f/u w/ them outpatient for possible debridement but to f/u w/ a wound care center and consider hyperbaric oxygen to help w/ wound healing.  Patient received Rituxan under heme/onc. -Hospitalization complicated with ischemic right foot and aortic thrombus noted. -Patient on IV heparin and tPA initiated per vascular surgery. -Patient subsequently underwent mechanical thrombectomy of aorta with stent placement per  vascular surgery 09/08/2020. -Vascular surgery following.     Assessment & Plan:   Principal Problem:   Osteomyelitis of ankle (HCC) Active Problems:   Tobacco use disorder   Depression   Iron deficiency anemia   Thrombocytosis   Foot pain, right   Aortic thrombus (HCC)   Ischemic pain of right foot: Concern for/probable   1 nonhealing right lateral malleolus ulcer/cellulitis-P ANCA vasculitis -Patient noted to P ANCA positive.  Chest x-ray, CT chest with interstitial lung disease. -Concern for possible Buerger's disease with history of smoking. -Right lower extremity ultrasound negative for DVT. -Patient was seen in consultation by podiatry who no longer recommends follow-up with them in the outpatient setting but to follow-up with the wound care center and consider hyperbaric oxygen to help with wound healing. -Patient seen by vascular surgery, underwent angiogram without intervention with normal common femoral artery, profunda femoris artery was small but normal, superficial femoral artery and popliteal arteries normal.  Typical tibial trifurcation with three-vessel runoff although all 3 vessels were small with poor flow into the foot and small vessel disease in the foot and ankle.  -Patient being followed by ID and cultures with abundant MRSA within mixed organisms and stenotrophomonas.  -Patient was on Augmentin and transitioned to Bactrim DS to complete a 7-day course of treatment per ID recommendations.  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 -Hematology consulted and case discussed with rheumatology and hematology via secure chat and patient seen in consultation as well by Dr. Orlie Dakin and patient status post infusion  of rituximab 09/05/2020 with recommendations from rheumatology to repeat dosing in 2 weeks as outpatient which will be arranged for September 19, 2020.  -In discussion with ID and hematology given the COVID-vaccine just prior to rituximab would not be helpful nor effective per ID and options are Evushield after rituximab and vaccine later on. -Supportive care -Patient now with ischemic right foot.   -Vascular surgery consulted and assessed the patient.  See #7.  -Patient started on prednisone 60 mg daily with gradual tapering in the outpatient setting per rheumatology recommendations. -Appreciate rheumatology, hematology, ID, vascular input and recommendations. -Follow.  2.  Depression -Stable. -Patient tearful with current hospitalization with ischemic foot and concerns for probable amputation to be done during this hospitalization per patient. -It is noted that patient stopped taking home dose bupropion, Cymbalta, ariprazole. -Continue Celexa.   -Outpatient follow-up.   3.  Nicotine dependence -Cessation stressed to patient and patient very motivated to quit a lot of current hospitalization and ischemic foot.   -Nicotine patch.   4.  Iron deficiency anemia -Continue oral iron supplementation.   -Hemoglobin currently at 9.2.   -May benefit from IV iron in the future which may be done in the outpatient setting.   -Transfusion threshold hemoglobin < 7.  5.  Thrombocytosis -Likely reactive. -Trending down.  6.  Diarrhea -Diarrhea resolved on Questran twice daily and Imodium scheduled.   -Questran and Imodium discontinued.   -Patient denies any further diarrhea episodes.   -Lomotil as needed.   7.  Right ischemic foot -Patient with  complaints of severe right foot pain starting around 5 AM on 09/06/2020, and described the pain as a burning pain similar to like a frostbite.  Unable to palpate dorsalis pedis pulses by hand and with a Doppler. -Concern for ischemic foot.   -Patient still with  significant ongoing pain, inability to feel DP pulses, right foot is cold.  Right foot is bluish/mottled appearance -Patient seen in consultation by vascular surgery, Dr. Myra Gianotti who assessed patient and could feel pulses above the ankle/wound area but no DP pulses and concern about possible ischemic foot. -Dr. Myra Gianotti recommended heparin bolus and full dose heparin.  -Patient underwent CT angiogram of abdominal aorta with iliofemoral runoff which showed irregular, pedunculated aortic plaque/thrombus of the lower infrarenal abdominal aorta, potential source of embolus, plaque/thrombus contributes to 50% cross-sectional diameter narrowing of the lower infrarenal abdominal aorta.  Aortic atherosclerosis.  Minimal bilateral iliac artery disease.  No significant femoropopliteal disease.  Absent contrast column within the distal right AT, PT and peroneal arteries just above the ankle, favored to represent distal embolization, however slow arrival of contrast bolus could have this appearance.   -Fasting lipid panel with LDL of 118  -Patient s/p thrombolytic therapy in the ICU. -Status post mechanical thrombectomy of aorta and "kissing balloon" stent placement per vascular surgery 09/08/2020.   -Continue Lipitor, heparin.   -Discontinue Percocet as patient states not helping.  Change Vicodin to every 4 hours as needed.  Change Dilaudid to 2 to 3 mg every 4 hours as needed pain.   -Patient stating was seen by vascular surgery this morning who stated she needed amputation to be done for ischemic right foot.  -Per vascular surgery.  -Secure chat between ID, hematology oncology, vascular surgery done with concerns for etiology of patient's ischemic foot. -Hypercoagulable work-up/labs ordered per hematology, check a 2D echo to rule out cardioembolic source. -Patient likely will need to be on anticoagulation and aspirin on discharge per vascular.  8.  Aortic plaque/thrombus of lower infrarenal abdominal  aorta -Noted on CT angiogram of abdominal aorta and iliofemoral runoff. -Patient being followed by vascular surgery and patient underwent mechanical thrombectomy of the aorta and right common iliac artery with kissing balloon and stent placement to bilateral common iliac arteries per vascular surgery, Dr. Wyn Quaker 09/08/2020.   -Continue IV heparin.   -Status post thrombolytic therapy for ischemic foot  -Per vascular surgery.    DVT prophylaxis: Heparin Code Status: Full Family Communication: Updated patient.  No family at bedside. Disposition:   Status is: Inpatient  Remains inpatient appropriate because:IV treatments appropriate due to intensity of illness or inability to take PO  Dispo: The patient is from: Home              Anticipated d/c is to: Home              Patient currently is not medically stable to d/c.   Difficult to place patient No       Consultants:  Vascular surgery: Dr. Wyn Quaker 08/29/2020 Podiatry: Dr. Allena Katz 08/30/2020 Infectious disease: Dr. Avon Gully 09/01/2020 Rheumatology: Dr. Gavin Potters 09/01/2020 Hematology/oncology: Dr. Orlie Dakin Vascular surgery: Dr. Myra Gianotti 09/06/2020    Procedures:  CT chest 09/02/2020 Chest x-ray 08/28/2020 Plain films of the right ankle 08/28/2020 Lower extremity Doppler 08/29/2020 Ultrasound guidance for vascular access left femoral artery/catheter placement into right SFA from left femoral approach/aortogram and selective right lower extremity angiogram/StarClose closure device left femoral artery per Dr. Wyn Quaker 09/01/2020 CT angiogram of abdominal aorta and iliofemoral runoff 09/06/2020 3.  Ultrasound-guided access, right femoral artery, right lower extremity runoff, intra-arterial injection of tPA, initiation of thrombolytic therapy by Dr. Myra Gianotti 09/07/2020 Ultrasound guidance for vascular access left femoral artery, catheter placement into aorta from bilateral femoral approaches, right femoral artery cutdown, mechanical thrombectomy of aorta  and right common iliac artery with a 8 [place] Rex device, kissing balloon stent placement to bilateral common iliac arteries and distal aorta, StarClose closure device left femoral artery per Dr. Wyn Quaker 09/08/2020   Antimicrobials:  Augmentin 09/01/2020>>>>> 09/03/2020 Bactrim DS 09/03/2020>>>> IV Rocephin 08/29/2020>>>> 09/01/2020 IV Flagyl 08/29/2020>>>> 09/01/2020 IV vancomycin 08/29/2020>>>> 08/31/2020    Subjective: Patient laying in bed tearful.  Complain of significant pain in the right foot.  States was told by vascular surgery that she is going to need an amputation.  No chest pain.  No shortness of breath.  No abdominal pain.  No diarrhea.  Objective: Vitals:   09/09/20 0400 09/09/20 0500 09/09/20 0600 09/09/20 0800  BP: 113/70 (!) 105/53 123/64 122/64  Pulse: 72 70 69 74  Resp: (!) 9 17 14 13   Temp: 98.3 F (36.8 C)     TempSrc: Oral     SpO2: 98% 96% 99% 99%  Weight:      Height:        Intake/Output Summary (Last 24 hours) at 09/09/2020 0910 Last data filed at 09/09/2020 0600 Gross per 24 hour  Intake 2175.66 ml  Output 3150 ml  Net -974.34 ml    Filed Weights   09/01/20 0937 09/07/20 1332 09/08/20 1200  Weight: 58.5 kg 57.9 kg 57 kg    Examination:  General exam: NAD Respiratory system: Fine diffuse bibasilar crackles.  Clear to auscultation anterior lung fields.  No wheezing.  No rhonchi.  Normal respiratory effort.  Speaking in full sentences.   Cardiovascular system: Regular rate rhythm no murmurs rubs or gallops.  No JVD.  No lower extremity edema.  Gastrointestinal system: Abdomen is soft, nontender, nondistended, positive bowel sounds.  No rebound.  No guarding. Central nervous system: Alert and oriented.  No focal neurological deficits.  Extremities: Symmetric 5 x 5 power.  Right foot cold.  Bluish discoloration/mottled mid right foot to toes.  Unable to palpate DP pulses.  Able to move great toe and second toe. Skin: Large black ulcer noted over right  malleolus.  No significant discharge noted.  Psychiatry: Judgement and insight appear normal. Mood & affect appropriate.     Data Reviewed: I have personally reviewed following labs and imaging studies  CBC: Recent Labs  Lab 09/04/20 0436 09/05/20 0553 09/06/20 0512 09/07/20 0426 09/07/20 1347 09/07/20 1929 09/08/20 0154 09/08/20 0422 09/08/20 0759 09/09/20 0152  WBC 14.2* 12.7* 6.5 15.4*   < > 14.7* 16.2* 15.6* 14.4* 13.8*  NEUTROABS 12.5* 9.3* 5.5 10.6*  --   --   --   --   --  12.5*  HGB 10.2* 10.2* 10.8* 11.6*   < > 11.6* 10.8* 10.5* 11.0* 9.2*  HCT 32.3* 32.7* 34.0* 36.3   < > 36.9 34.0* 33.6* 35.5* 28.5*  MCV 76.5* 75.5* 75.6* 75.0*   < > 76.2* 76.7* 75.8* 76.0* 75.0*  PLT 470* 488* 401* 397   < > 348 298 291 308 262   < > = values in this interval not displayed.     Basic Metabolic Panel: Recent Labs  Lab 09/04/20 0436 09/05/20 0553 09/06/20 0512 09/07/20 0426 09/08/20 0422 09/09/20 0152  NA 138 137 136 135 135 137  K 4.0 4.3 3.9 3.4*  4.0 4.0  CL 107 105 102 99 104 105  CO2 GLUCOSE 125* 107* 121* 93 107* 157*  BUN 21* 25* CREATININE 0.46 0.58 0.46 0.64 0.43* 0.41*  CALCIUM 8.3* 8.4* 8.1* 8.0* 7.9* 8.0*  MG 2.3  --  2.2  --  2.0 2.1     GFR: Estimated Creatinine Clearance: 68.8 mL/min (A) (by C-G formula based on SCr of 0.41 mg/dL (L)).  Liver Function Tests: No results for input(s): AST, ALT, ALKPHOS, BILITOT, PROT, ALBUMIN in the last 168 hours.   CBG: Recent Labs  Lab 09/07/20 1338  GLUCAP 143*      Recent Results (from the past 240 hour(s))  MRSA PCR Screening     Status: None   Collection Time: 08/30/20  1:32 PM  Result Value Ref Range Status   MRSA by PCR NEGATIVE NEGATIVE Final    Comment:        The GeneXpert MRSA Assay (FDA approved for NASAL specimens only), is one component of a comprehensive MRSA colonization surveillance program. It is not intended to diagnose MRSA infection nor to guide  or monitor treatment for MRSA infections. Performed at [facility], [address] [place], [place] [address]   Urine culture     Status: None   Collection Time: 09/01/20  9:15 AM   Specimen: In/Out Cath Urine  Result Value Ref Range Status   Specimen Description   Final    IN/OUT CATH URINE Performed at [facility], [address] [place], [place] [address]    Special Requests   Final    NONE Performed at [facility], [address] [place], [place] [address]    Culture   Final    NO GROWTH Performed at [facility], [address], [place], [place] [address]    Report Status 09/02/2020 FINAL  Final  Aerobic Culture w Gram Stain (superficial specimen)     Status: None   Collection Time: 09/01/20  2:40 PM   Specimen: Wound  Result Value Ref Range Status   Specimen Description   Final    WOUND Performed at [facility], [address] [place], [place] [address]    Special Requests   Final    NONE Performed at [facility], [address] [place], [place] [address]    Gram Stain   Final    RARE WBC PRESENT, PREDOMINANTLY PMN FEW GRAM NEGATIVE RODS FEW GRAM POSITIVE COCCI IN CLUSTERS    Culture   Final    ABUNDANT METHICILLIN RESISTANT STAPHYLOCOCCUS AUREUS WITHIN MIXED ORGANISMS Performed at [facility], [address] [place], [place] [address]    Report Status 09/04/2020 FINAL  Final   Organism ID, Bacteria METHICILLIN RESISTANT STAPHYLOCOCCUS AUREUS  Final      Susceptibility   Methicillin resistant staphylococcus aureus - MIC*    CIPROFLOXACIN <=0.5 SENSITIVE Sensitive     ERYTHROMYCIN <=0.25 SENSITIVE Sensitive     GENTAMICIN <=0.5 SENSITIVE Sensitive     OXACILLIN >=4 RESISTANT Resistant     TETRACYCLINE >=16 RESISTANT Resistant     VANCOMYCIN <=0.5 SENSITIVE Sensitive     TRIMETH/SULFA <=10 SENSITIVE Sensitive     CLINDAMYCIN <=0.25 SENSITIVE Sensitive     RIFAMPIN <=0.5 SENSITIVE  Sensitive     Inducible Clindamycin NEGATIVE Sensitive     * ABUNDANT METHICILLIN RESISTANT STAPHYLOCOCCUS AUREUS  Radiology Studies: PERIPHERAL VASCULAR CATHETERIZATION  Result Date: 09/07/2020 Images from the original result were not included. Patient name: Alexandra [name] MRN: [id number] DOB: 04-13-1970 Sex: female 09/07/2020 Pre-operative Diagnosis: Ischemic right foot Post-operative diagnosis:  Same Surgeon:  Durene Cal Procedure Performed:  1.  Antegrade ultrasound-guided access, right femoral artery  2.  Right lower extremity runoff  3.  Intra-arterial injection of tPA  4.  Initiation of thrombolytic therapy  5.  Conscious sedation, 27 minutes  \ Indications: This is a 50 year old female with nonhealing right leg ulcer who has been diagnosed with vasculitis.  She underwent angiography a week ago that showed poor perfusion onto her foot.  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  Yesterday she developed acute changes to her foot including pain and numbness.  She did have motor function.  Because this was felt to be small vessel disease, I tried IV heparin, because she did have Doppler signals down to her ulcer, but not onto her foot.  I also got a CT angiogram.  This revealed a aortic thrombus.  The patient did not improve overnight with IV heparin.  I discussed surgical thrombectomy versus thrombolytic therapy.  Because surgical thrombectomy would not address her microcirculation, I felt that initiation of lytic therapy provided the best chance for limb salvage.  She does have an aortic thrombus.  I felt that this was at high risk for being dislodged if I obtain contralateral access.  Therefore I discussed getting antegrade access.  I told her that we would need to address her aortic thrombus in the near future.  The last thing I wanted to do was to dislodge her thrombus and commit her to a surgical thrombectomy which would make thrombolytic therapy not an option.  Procedure:  The patient was identified in the holding area and taken to room 8.  The patient was then placed supine on the table and prepped and draped in the usual sterile fashion.  A time out was called.  Conscious sedation was administered with the use of IV fentanyl and Versed under continuous physician and nurse monitoring.  Heart rate, blood pressure, and oxygen saturation were continuously monitored.  Total sedation time was 27 minutes.  Ultrasound was used to evaluate the right common femoral artery.  It was patent .  A digital ultrasound image was acquired.  A micropuncture needle was used to access the right common femoral artery under ultrasound guidance in antegrade fashion.  A 018 wire was inserted followed by micropuncture sheath.  A contrast injection was performed and then I manipulated the wire into the superficial femoral artery.  The sheath was then inserted into the superficial femoral artery.  A 035 wire was then placed followed by a 5 [place] sheath.  Contrast injections were then performed Findings:  Right Lower Extremity: The common femoral profundofemoral, superficial femoral, and popliteal artery were widely patent.  There did appear to be some spasm within the popliteal artery from the wire.  All 3 tibial vessels were patent down to the lower leg where they occluded.  There was minimal opacification of the vessels across the ankle onto the foot. Intervention: After the images were acquired, I exchanged the 5 French sheath for a 6 French 45 cm sheath.  A 20 cm infusion length catheter was then placed.  10 mg of tPA was then administered and a pulse spray fashion.  Next, the tPA infusion was initiated through the infusion catheter, and heparin was 1 through the  sheath.  This sheath was sutured into position.  The patient be taken to the ICU for overnight tPA administration Impression:  #1  Initiation of thrombolytic therapy  #2  Minimal opacification of the tibial vessels beginning at the  ankle  #3  No significant atherosclerotic changes noted within the common femoral profundofemoral, superficial femoral, popliteal artery, and proximal tibial vessels V. Durene CalWells Brabham, M.D., [facility] [place] Office: [phone] Pager:  [phone]   DG C-Arm 1-60 Min-No Report  Result Date: 09/08/2020 Fluoroscopy was utilized by the requesting physician.  No radiographic interpretation.        Scheduled Meds:  vitamin C  250 mg Oral BID   atorvastatin  40 mg Oral Daily   Chlorhexidine Gluconate Cloth  6 each Topical Daily   cholecalciferol  1,000 Units Oral Daily   citalopram  20 mg Oral Daily   feeding supplement  1 Container Oral TID BM   ferrous gluconate  324 mg Oral BID WC   gabapentin  400 mg Oral TID   nicotine  21 mg Transdermal Daily   nutrition supplement (JUVEN)  1 packet Oral BID BM   pantoprazole  40 mg Oral Daily   predniSONE  60 mg Oral QAC breakfast   sodium chloride flush  3 mL Intravenous Q12H   sulfamethoxazole-trimethoprim  1 tablet Oral Q12H   Continuous Infusions:  sodium chloride Stopped (08/29/20 1709)   sodium chloride 75 mL/hr at 09/09/20 0600   heparin 9 Units/hr (09/09/20 0600)     LOS: 12 days    Time spent: 40 minutes    Ramiro Harvestaniel Yaminah Clayborn, MD [facility]   To contact the attending provider between 7A-7P or the covering provider during after hours 7P-7A, please log into the web site [email or web address] and access using universal [place] password for that web site. If you do not have the password, please call the hospital operator.  09/09/2020, 9:10 AM

## 2020-09-09 NOTE — Progress Notes (Signed)
Date of Admission:  08/28/2020    ID:  I had seen the patient last week between 09/01/20-09/04/20 for a leg ulcer on the lateral aspect of the rt foot close to the ankle which had been present for 3 months. She was diagnosed with positive P ANCA and rheumatoid factor in labs from her previous hospitalization in May 2022. She then saw dermatologist as OP and underwent biopsy. On this admission I was asked to see her for the ulcer-on 09/01/20 . I got rheumatologist involved because of P Anca titer of 1: 160 from 07/30/20 .It was thought that the ulcer was due to vasculitis. I sent culture from the ulcer - pt was on cefazolin + flagyl On 09/01/20 she had an angio done by Dr.Dew and his report  notes  normal common femoral artery, profunda femoris artery was small but normal, superficial femoral artery and popliteal arteries were normal.  There was a typical tibial trifurcation with three-vessel runoff although all 3 vessels were small with poor flow into the foot and small vessel disease in the foot and ankle.  Although there were not the pathognomonic corkscrew appearance of Buerger's disease, the small vessel disease is consistent with Buerger's disease.   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 She received 3 days of high dose solumedrol( 1 gram) . I signed off on 09/04/20 as she was doing well and put her on bactrim for a week for MRSA and stenotrophomonas in the wound culture.  She got rituximab on 09/05/20- during the infusion pt had low Bps ( 86/44, 80/53,78/60, then got 500cc NS bolus her 74/64 ( complained of headache) and final BP after completion of Rx was 89/44  Prior to that pt has had soft BP but not as low   Pt developed acute rt foot pain at 5 am on 09/06/20. 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 The plaque/thrombus contributes to 50% cross-sectional diameter narrowing of the lower infrarenal abdominal aorta.    There is absent contrast column within the distal right AT, PT, and peroneal arteries, just above the ankle, favored to represent distal embolization, however, slow arrival of the contrast bolus could have this appearance.  09/07/20- intra-arterial injection of TpA  09/08/20 Angio done by Dr.Dew Mechanical thrombectomy of the aorta and right common iliac artery with the 8 [place] Rex device. Kissing balloon stent placement to bilateral common iliac arteries and the distal aorta with 7 mm diameter by 26 mm length stent on the left and 8 mm diameter by 37 mm length stent on the right which was postdilated with a 9 to 10 mm balloon distally  09/09/20- no improvement in rt leg  Pt still has severe pain, cold and numb feeling rt foot No diarrhea No fever No pain abdomen Says ulcer pain has improved a lot   Medications:   vitamin C  250 mg Oral BID   atorvastatin  40 mg Oral Daily   Chlorhexidine Gluconate Cloth  6 each Topical Daily   Chlorhexidine Gluconate Cloth  6 each Topical Once   And   Chlorhexidine Gluconate Cloth  6 each Topical Once   cholecalciferol  1,000 Units Oral Daily   citalopram  20 mg Oral Daily   feeding  supplement  1 Container Oral TID BM   ferrous gluconate  324 mg Oral BID WC   gabapentin  400 mg Oral TID   nicotine  21 mg Transdermal Daily   nutrition supplement (JUVEN)  1 packet Oral BID BM   pantoprazole  40 mg Oral Daily   predniSONE  60 mg Oral QAC breakfast   sodium chloride flush  3 mL Intravenous Q12H   sulfamethoxazole-trimethoprim  1 tablet Oral Q12H    Objective: Vital signs in last 24 hours: Temp:  [98.2 F (36.8 C)-98.8 F (37.1 C)] 98.4 F (36.9 C) (06/28 1600) Pulse Rate:  [67-99] 78 (06/28 1600) Resp:  [7-17] 17 (06/28 1600) BP:  (95-123)/(50-72) 95/72 (06/28 1600) SpO2:  [94 %-100 %] 100 % (06/28 1600)  PHYSICAL EXAM:  General: Alert, cooperative, depressed ,Lungs: Clear to auscultation bilaterally. No Wheezing or Rhonchi. No rales. Heart: Regular rate and rhythm, no murmur, rub or gallop. Abdomen: Soft, non-tender,not distended. Bowel sounds normal. No masses Extremities:  Ulcer on the lateral aspect of rt foot- covered with eschar- more dry now Foot cold, blue  09/09/20     08/28/20  Neurologic: Grossly non-focal except rt foot  Lab Results Recent Labs    09/08/20 0422 09/08/20 0759 09/09/20 0152  WBC 15.6* 14.4* 13.8*  HGB 10.5* 11.0* 9.2*  HCT 33.6* 35.5* 28.5*  NA 135  --  137  K 4.0  --  4.0  CL 104  --  105  CO2 26  --  26  BUN 11  --  7  CREATININE 0.43*  --  0.41*   Liver Panel No results for input(s): PROT, ALBUMIN, AST, ALT, ALKPHOS, BILITOT, BILIDIR, IBILI in the last 72 hours. Sedimentation Rate No results for input(s): ESRSEDRATE in the last 72 hours. C-Reactive Protein No results for input(s): CRP in the last 72 hours.  Microbiology: Wound culture MRSA Studies/Results: DG C-Arm 1-60 Min-No Report  Result Date: 09/08/2020 Fluoroscopy was utilized by the requesting physician.  No radiographic interpretation.     Assessment/Plan:  Acute ischemia  of the rt foot -- Had underlying poor flow into AT, PT, Peroneal artery at the level of ankle from angio on 6/20 -  Atypical Buerger's? Current smoker    AND one of the below   acute ischemia from emboli originating from aortic thrombus? or Does she have another thrombophilic condition?- work up sent by Dr. Orlie Dakin or Is this because of  lower BP than usual  with hypoperfusion on 6/24 /22 during infusion on top of  Small vessel vasculitis causing limb threatening ischemia?  Dr.Dew following her and she is facing BKA   Leucocytosis due to acute ischemia -stable  Vasculitis ulcer ( less likely pyoderma gangrenosum) on  prednisone.  MRSA and steno cultured  superficial ly from the wound- could be colonization -  on Bactrim-for 7-10 days  . ulcer appears to improve, dry and not painful and covered with eschar  Small vessel vasculitis, positive P AnCA, low complement, lung involvement - on prednisone ( got rituximab) /high dose IV solumedrol X3)   Discussed the management in great detail with patient and her mother Case conference with all specialties and hospitalist

## 2020-09-09 NOTE — Progress Notes (Signed)
[facility]  Daily Progress Note   Subjective  -   Right foot with fixed skin changes to the midfoot and decreased neurovascular function.  Still having a lot of pain in that right foot.  Right groin is doing okay.  No major events overnight  Objective Vitals:   09/09/20 0400 09/09/20 0500 09/09/20 0600 09/09/20 0800  BP: 113/70 (!) 105/53 123/64 122/64  Pulse: 72 70 69 74  Resp: (!) 9 17 14 13   Temp: 98.3 F (36.8 C)   98.4 F (36.9 C)  TempSrc: Oral   Oral  SpO2: 98% 96% 99% 99%  Weight:      Height:        Intake/Output Summary (Last 24 hours) at 09/09/2020 1230 Last data filed at 09/09/2020 1034 Gross per 24 hour  Intake 2498.82 ml  Output 2450 ml  Net 48.82 ml    PULM  CTAB CV  RRR VASC  right foot with fixed skin changes to the midfoot.  Decreased sensation and motor function in the toes.  Warm to the ankle  Laboratory CBC    Component Value Date/Time   WBC 13.8 (H) 09/09/2020 0152   HGB 9.2 (L) 09/09/2020 0152   HGB 13.5 11/08/2013 1424   HCT 28.5 (L) 09/09/2020 0152   HCT 42.1 11/08/2013 1424   PLT 262 09/09/2020 0152   PLT 304 11/08/2013 1424    BMET    Component Value Date/Time   NA 137 09/09/2020 0152   NA 133 (L) 11/08/2013 1424   K 4.0 09/09/2020 0152   K 3.7 11/08/2013 1424   CL 105 09/09/2020 0152   CL 100 11/08/2013 1424   CO2 26 09/09/2020 0152   CO2 23 11/08/2013 1424   GLUCOSE 157 (H) 09/09/2020 0152   GLUCOSE 92 11/08/2013 1424   BUN 7 09/09/2020 0152   BUN 10 11/08/2013 1424   CREATININE 0.41 (L) 09/09/2020 0152   CREATININE 0.73 11/08/2013 1424   CALCIUM 8.0 (L) 09/09/2020 0152   CALCIUM 8.3 (L) 11/08/2013 1424   GFRNONAA >60 09/09/2020 0152   GFRNONAA >60 11/08/2013 1424   GFRAA >60 03/25/2017 1955   GFRAA >60 11/08/2013 1424    Assessment/Planning: POD #1 s/p right iliac and aortic thrombectomy and kissing balloon stent placements.  Flow was fairly normal down to the ankle but the foot remains  ischemic At this point, her foot is likely not salvageable and clearly causing her a lot of pain I would recommend right below-knee amputation at some point this week. I discussed the reason and rationale for amputation and why her foot is really not functional or salvageable at this point. She is processing this but seems to understand that she will require amputation.   11/10/2013  09/09/2020, 12:30 PM

## 2020-09-09 NOTE — Progress Notes (Signed)
ANTICOAGULATION CONSULT NOTE  Pharmacy Consult for heparin infusion Indication:  VTE treatment  No Known Allergies  Patient Measurements: Height: 5\' 3"  (160 cm) Weight: 57 kg (125 lb 10.6 oz) IBW/kg (Calculated) : 52.4  Vital Signs: Temp: 98.2 F (36.8 C) (06/28 0000) Temp Source: Oral (06/28 0000) BP: 109/60 (06/28 0200) Pulse Rate: 74 (06/28 0200)  Labs: Recent Labs    09/07/20 0426 09/07/20 0958 09/08/20 0154 09/08/20 0422 09/08/20 0759 09/08/20 1039 09/09/20 0152  HGB 11.6*   < > 10.8* 10.5* 11.0*  --  9.2*  HCT 36.3   < > 34.0* 33.6* 35.5*  --  28.5*  PLT 397   < > 298 291 308  --  262  HEPARINUNFRC 0.33   < > 0.14*  --   --  0.12* 0.22*  CREATININE 0.64  --   --  0.43*  --   --  0.41*   < > = values in this interval not displayed.     Estimated Creatinine Clearance: 68.8 mL/min (A) (by C-G formula based on SCr of 0.41 mg/dL (L)).   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 She was hospitalized 1 month ago and was discharged home on doxycycline which she completed without any significant improvement.  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 Pt underwent angiogram 09/01/20 without intervention. Pharmacy has been consulted for heparin dosing and monitoring. H&H, platelets stable 6/27: mechanical  thrombectomy of aortic thrombus and further stenting/revascularizing of right lower limb  6/27  Goal of Therapy:  Heparin level 0.2-0.5 units/ml Monitor platelets by anticoagulation protocol: Yes  06/28 0152 HL 0.22 in desired therapeutic range   Plan:  Continue heparin infusion at 900 units/hr Recheck HL 6 hrs to confirm Daily CBC while on heparin.  7/28, PharmD, [facility] 09/09/2020 3:18 AM

## 2020-09-09 NOTE — Progress Notes (Signed)
ANTICOAGULATION CONSULT NOTE  Pharmacy Consult for heparin infusion Indication:  VTE treatment  No Known Allergies  Patient Measurements: Height: 5\' 3"  (160 cm) Weight: 57 kg (125 lb 10.6 oz) IBW/kg (Calculated) : 52.4  Vital Signs: Temp: 98.4 F (36.9 C) (06/28 0800) Temp Source: Oral (06/28 0800) BP: 122/64 (06/28 0800) Pulse Rate: 74 (06/28 0800)  Labs: Recent Labs    09/07/20 0426 09/07/20 0958 09/08/20 0422 09/08/20 0759 09/08/20 1039 09/09/20 0152 09/09/20 0847  HGB 11.6*   < > 10.5* 11.0*  --  9.2*  --   HCT 36.3   < > 33.6* 35.5*  --  28.5*  --   PLT 397   < > 291 308  --  262  --   HEPARINUNFRC 0.33   < >  --   --  0.12* 0.22* 0.28*  CREATININE 0.64  --  0.43*  --   --  0.41*  --    < > = values in this interval not displayed.     Estimated Creatinine Clearance: 68.8 mL/min (A) (by C-G formula based on SCr of 0.41 mg/dL (L)).   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 She was hospitalized 1 month ago and was discharged home on doxycycline which she completed without any significant improvement.  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 Pt underwent angiogram 09/01/20 without intervention. Pharmacy has been consulted for heparin dosing and monitoring. H&H, platelets stable 6/27: mechanical  thrombectomy of aortic thrombus and further stenting/revascularizing of right lower limb  Goal of Therapy:  Heparin level 0.2-0.5 units/ml Monitor platelets by anticoagulation protocol: Yes  06/28 0152 HL 0.22 therapeutic 06/28 0847 HL 0.28 therapeutic   Plan:  Continue heparin infusion at 900 units/hr Daily CBC and HL while on heparin  7/28, PharmD 09/09/2020 12:11 PM

## 2020-09-10 DIAGNOSIS — M7989 Other specified soft tissue disorders: Secondary | ICD-10-CM

## 2020-09-10 LAB — CBC WITH DIFFERENTIAL/PLATELET
Abs Immature Granulocytes: 0.15 10*3/uL — ABNORMAL HIGH (ref 0.00–0.07)
Basophils Absolute: 0 10*3/uL (ref 0.0–0.1)
Basophils Relative: 0 %
Eosinophils Absolute: 0 10*3/uL (ref 0.0–0.5)
Eosinophils Relative: 0 %
HCT: 31.9 % — ABNORMAL LOW (ref 36.0–46.0)
Hemoglobin: 9.9 g/dL — ABNORMAL LOW (ref 12.0–15.0)
Immature Granulocytes: 1 %
Lymphocytes Relative: 11 %
Lymphs Abs: 2 10*3/uL (ref 0.7–4.0)
MCH: 23.9 pg — ABNORMAL LOW (ref 26.0–34.0)
MCHC: 31 g/dL (ref 30.0–36.0)
MCV: 77.1 fL — ABNORMAL LOW (ref 80.0–100.0)
Monocytes Absolute: 1.1 10*3/uL — ABNORMAL HIGH (ref 0.1–1.0)
Monocytes Relative: 6 %
Neutro Abs: 15.5 10*3/uL — ABNORMAL HIGH (ref 1.7–7.7)
Neutrophils Relative %: 82 %
Platelets: 307 10*3/uL (ref 150–400)
RBC: 4.14 MIL/uL (ref 3.87–5.11)
RDW: 19.5 % — ABNORMAL HIGH (ref 11.5–15.5)
WBC: 18.7 10*3/uL — ABNORMAL HIGH (ref 4.0–10.5)
nRBC: 0 % (ref 0.0–0.2)

## 2020-09-10 LAB — LUPUS ANTICOAGULANT PANEL
DRVVT: 31.6 s (ref 0.0–47.0)
PTT Lupus Anticoagulant: 47.2 s (ref 0.0–51.9)

## 2020-09-10 LAB — BETA-2-GLYCOPROTEIN I ABS, IGG/M/A
Beta-2 Glyco I IgG: <9 GPI IgG units (ref 0–20)
Beta-2-Glycoprotein I IgA: <9 GPI IgA units (ref 0–25)
Beta-2-Glycoprotein I IgM: <9 GPI IgM units (ref 0–32)

## 2020-09-10 LAB — BASIC METABOLIC PANEL
Anion gap: 7 (ref 5–15)
BUN: 9 mg/dL (ref 6–20)
CO2: 26 mmol/L (ref 22–32)
Calcium: 8.4 mg/dL — ABNORMAL LOW (ref 8.9–10.3)
Chloride: 104 mmol/L (ref 98–111)
Creatinine, Ser: 0.33 mg/dL — ABNORMAL LOW (ref 0.44–1.00)
GFR, Estimated: >60 mL/min (ref >60)
Glucose, Bld: 106 mg/dL — ABNORMAL HIGH (ref 70–99)
Potassium: 4.1 mmol/L (ref 3.5–5.1)
Sodium: 137 mmol/L (ref 135–145)

## 2020-09-10 LAB — PROTEIN S ACTIVITY: Protein S Activity: 64 % (ref 63–140)

## 2020-09-10 LAB — ECHOCARDIOGRAM COMPLETE
Area-P 1/2: 4.39 cm2
Height: 63 in
S' Lateral: 2.8 cm
Weight: 2010.6 oz

## 2020-09-10 LAB — PROTEIN S, TOTAL: Protein S Ag, Total: 108 % (ref 60–150)

## 2020-09-10 LAB — PROTEIN C, TOTAL: Protein C, Total: 130 % (ref 60–150)

## 2020-09-10 LAB — HEPARIN LEVEL (UNFRACTIONATED): Heparin Unfractionated: 0.21 IU/mL — ABNORMAL LOW (ref 0.30–0.70)

## 2020-09-10 LAB — PROTEIN C ACTIVITY: Protein C Activity: 141 % (ref 73–180)

## 2020-09-10 MED ORDER — MORPHINE SULFATE (PF) 4 MG/ML IV SOLN
5.0000 mg | INTRAVENOUS | Status: DC | PRN
  Administered 2020-09-10 – 2020-09-11 (×9): 5 mg via INTRAVENOUS
  Filled 2020-09-10 (×9): qty 2

## 2020-09-10 NOTE — Progress Notes (Signed)
Neuro: A&Ox4 Resp: stable on room air CV: afebrile, vital signs stable GIGU: purewick in place, general diet Skin: dry, see LDA Social: Mother, Son, and Daughter visited today, all questions and concerns addressed.  Events: transferred to room 218

## 2020-09-10 NOTE — Progress Notes (Signed)
[facility]  Daily Progress Note   Subjective  -   Having a lot of pain in the right foot.  No other major events.  Objective Vitals:   09/10/20 0500 09/10/20 0600 09/10/20 1000 09/10/20 1200  BP: 130/62 (!) 115/54 (!) 106/55 (!) 100/51  Pulse: 78 76 73 76  Resp: 12 12 15 14   Temp:   98.2 F (36.8 C)   TempSrc:   Oral   SpO2: 100% 97% 94% 97%  Weight:      Height:        Intake/Output Summary (Last 24 hours) at 09/10/2020 1611 Last data filed at 09/10/2020 1200 Gross per 24 hour  Intake 1665.94 ml  Output 1825 ml  Net -159.06 ml    PULM  CTAB CV  RRR VASC  right foot remains mottled and ischemic with diminished motor and sensory function in the foot  Laboratory CBC    Component Value Date/Time   WBC 18.7 (H) 09/10/2020 0406   HGB 9.9 (L) 09/10/2020 0406   HGB 13.5 11/08/2013 1424   HCT 31.9 (L) 09/10/2020 0406   HCT 42.1 11/08/2013 1424   PLT 307 09/10/2020 0406   PLT 304 11/08/2013 1424    BMET    Component Value Date/Time   NA 137 09/10/2020 0406   NA 133 (L) 11/08/2013 1424   K 4.1 09/10/2020 0406   K 3.7 11/08/2013 1424   CL 104 09/10/2020 0406   CL 100 11/08/2013 1424   CO2 26 09/10/2020 0406   CO2 23 11/08/2013 1424   GLUCOSE 106 (H) 09/10/2020 0406   GLUCOSE 92 11/08/2013 1424   BUN 9 09/10/2020 0406   BUN 10 11/08/2013 1424   CREATININE 0.33 (L) 09/10/2020 0406   CREATININE 0.73 11/08/2013 1424   CALCIUM 8.4 (L) 09/10/2020 0406   CALCIUM 8.3 (L) 11/08/2013 1424   GFRNONAA >60 09/10/2020 0406   GFRNONAA >60 11/08/2013 1424   GFRAA >60 03/25/2017 1955   GFRAA >60 11/08/2013 1424    Assessment/Planning: POD #2 s/p aortic and right iliac mechanical thrombectomy and kissing stent placement to bilateral common iliac arteries and distal aorta.  Right foot remains ischemic with good perfusion down to basically the ankle. Foot is not salvageable. Patient is in pain and is agreeable to proceed with right below-knee  amputation. Is currently on the schedule for Friday but if something cancels we will try to move her up to tomorrow.   Tuesday  09/10/2020, 4:11 PM

## 2020-09-10 NOTE — Progress Notes (Signed)
Nutrition Follow-up  DOCUMENTATION CODES:  Not applicable  INTERVENTION:  Liberalize diet to regular, encourage PO intake 1 packet Juven BID, each packet provides 95 calories, 2.5 grams of protein (collagen), and 9.8 grams of carbohydrate (3 grams sugar); also contains 7 grams of L-arginine and L-glutamine, 300 mg vitamin C, 15 mg vitamin E, 1.2 mcg vitamin B-12, 9.5 mg zinc, 200 mg calcium, and 1.5 g  Calcium Beta-hydroxy-Beta-methylbutyrate to support wound healing Vitamin C 500mg  daily Boost Breeze po TID, each supplement provides 250 kcal and 9 grams of protein  NUTRITION DIAGNOSIS:  Increased nutrient needs related to wound healing as evidenced by estimated needs.  GOAL:  Patient will meet greater than or equal to 90% of their needs  MONITOR:  PO intake, Supplement acceptance, Skin, Labs  REASON FOR ASSESSMENT:  Consult Wound healing  ASSESSMENT:  50 year old woman with ulcerative lesion to her right ankle worsening over the past 3 months. Tachycardic in triage, otherwise normal vitals.  X-ray demonstrates evidence of periosteal reaction and her medical picture is most consistent with osteomyelitis.  Vascular surgery consulted and determined that angiogram would be appropriate. Completed 6/20.  Pt developed worsening pain and numbness 6/25 and vascular surgery requested new CT angiogram which was suggestive of thrombus in her distal aorta. Taken to OR 6/26 placement of catheter to begin tPA therapy and 6/27 for endovascular stent graft. Pt moved to ICU 6/26 after procedure. Vascular surgery recommending BKA of right leg as interventions did not improve condition and they believe foot is not salvageable.   5.7% noted in the last month (5/14-6/20) but muscle and fat stores intact. Likely related to increased needs from infection.   Reviewed intake, seems to be trending down since last assessment but pt is routinely accepting the boost breeze supplements. Lipid panel drawn this  admission was normal. Will recommend liberalizing diet to encourage more PO intake.    Average Meal Intake: 6/16-6/17: 95% intake x 1 recorded meal 6/18-6/23: 73% average x 5 recorded meals 6/24-6/29: 37% intake x 3 recorded meals  Nutritionally Relevant Medications: Scheduled Meds:  vitamin C  250 mg Oral BID   atorvastatin  40 mg Oral Daily   cholecalciferol  1,000 Units Oral Daily   feeding supplement  1 Container Oral TID BM   ferrous gluconate  324 mg Oral BID WC   nutrition supplement (JUVEN)  1 packet Oral BID BM   pantoprazole  40 mg Oral Daily   predniSONE  60 mg Oral QAC breakfast   sulfamethoxazole-trimethoprim  1 tablet Oral Q12H   Continuous Infusions:  sodium chloride 75 mL/hr at 09/10/20 0600   heparin 900 Units/hr (09/10/20 0600)   PRN Meds: ondansetron  Labs Reviewed  NUTRITION - FOCUSED PHYSICAL EXAM: Flowsheet Row Most Recent Value  Orbital Region No depletion  Upper Arm Region No depletion  Thoracic and Lumbar Region No depletion  Buccal Region No depletion  Temple Region Mild depletion  Clavicle Bone Region No depletion  Clavicle and Acromion Bone Region No depletion  Scapular Bone Region No depletion  Dorsal Hand No depletion  Patellar Region No depletion  Anterior Thigh Region No depletion  Posterior Calf Region No depletion  Edema (RD Assessment) None  Hair Reviewed  Eyes Reviewed  Mouth Reviewed  Skin Reviewed  Nails Reviewed   Diet Order:   Diet Order             Diet regular Room service appropriate? Yes; Fluid consistency: Thin  Diet effective now  EDUCATION NEEDS:  No education needs have been identified at this time  Skin:  Skin Assessment: Skin Integrity Issues: Skin Integrity Issues:: Other (Comment) Other: open draining wound to the right, lateral ankle. 5x7 cm  Last BM:  6/26 per RN documentation  Height:  Ht Readings from Last 1 Encounters:  09/08/20 5\' 3"  (1.6 m)    Weight:  Wt Readings from  Last 1 Encounters:  09/08/20 57 kg   Ideal Body Weight:  52.3 kg  BMI:  Body mass index is 22.26 kg/m.  Estimated Nutritional Needs:  Kcal:  1800-2000 kcal/d Protein:  90-100 g/d Fluid:  >2L/d   09/10/20, RD, LDN Clinical Dietitian Pager on Amion

## 2020-09-10 NOTE — Progress Notes (Signed)
ANTICOAGULATION CONSULT NOTE  Pharmacy Consult for heparin infusion Indication:  VTE treatment  No Known Allergies  Patient Measurements: Height: 5\' 3"  (160 cm) Weight: 57 kg (125 lb 10.6 oz) IBW/kg (Calculated) : 52.4  Vital Signs: Temp: 98.2 F (36.8 C) (06/29 0400) Temp Source: Oral (06/29 0400) BP: 130/62 (06/29 0500) Pulse Rate: 78 (06/29 0500)  Labs: Recent Labs    09/08/20 0422 09/08/20 0759 09/08/20 1039 09/09/20 0152 09/09/20 0847 09/10/20 0406  HGB 10.5* 11.0*  --  9.2*  --  9.9*  HCT 33.6* 35.5*  --  28.5*  --  31.9*  PLT 291 308  --  262  --  307  HEPARINUNFRC  --   --    < > 0.22* 0.28* 0.21*  CREATININE 0.43*  --   --  0.41*  --  0.33*   < > = values in this interval not displayed.     Estimated Creatinine Clearance: 68.8 mL/min (A) (by C-G formula based on SCr of 0.33 mg/dL (L)).   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 She was hospitalized 1 month ago and was discharged home on doxycycline which she completed without any significant improvement.  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 Pt underwent angiogram 09/01/20 without intervention. Pharmacy has been consulted for heparin dosing and monitoring. H&H, platelets stable 6/27: mechanical thrombectomy of aortic thrombus and further  stenting/revascularizing of right lower limb  6/27  Goal of Therapy:  Heparin level 0.2-0.5 units/ml Monitor platelets by anticoagulation protocol: Yes  06/28 0152 HL 0.22 in desired therapeutic range 06/29 0406 HL 0.21   Plan:  Continue heparin infusion at 900 units/hr Recheck HL daily with AM labs. Daily CBC while on heparin.  7/29, PharmD, [facility] 09/10/2020 5:56 AM

## 2020-09-10 NOTE — Progress Notes (Signed)
[facility]  - [facility]   PATIENT NAME: Alexandra [name]    MR#:  [id number]  DATE OF BIRTH:  12-20-1970  SUBJECTIVE:  continues with right foot pain. Has significant cyanotic/discoloration. No family at bedside. Patient a bit tearful and appears overwhelmed with the possibility of right BKA. Discussed at length with her. REVIEW OF SYSTEMS:   Review of Systems  Constitutional:  Negative for chills, fever and weight loss.  HENT:  Negative for ear discharge, ear pain and nosebleeds.   Eyes:  Negative for blurred vision, pain and discharge.  Respiratory:  Negative for sputum production, shortness of breath, wheezing and stridor.   Cardiovascular:  Negative for chest pain, palpitations, orthopnea and PND.  Gastrointestinal:  Negative for abdominal pain, diarrhea, nausea and vomiting.  Genitourinary:  Negative for frequency and urgency.  Musculoskeletal:  Negative for back pain and joint pain.       Right leg pain  Neurological:  Positive for weakness. Negative for sensory change, speech change and focal weakness.  Psychiatric/Behavioral:  Negative for depression and hallucinations. The patient is not nervous/anxious.   Tolerating Diet: Tolerating PT:   DRUG ALLERGIES:  No Known Allergies  VITALS:  Blood pressure (!) 100/51, pulse 76, temperature 98.2 F (36.8 C), temperature source Oral, resp. rate 14, height 5\' 3"  (1.6 m), weight 57 kg, last menstrual period 01/14/2009, SpO2 97 %.  PHYSICAL EXAMINATION:   Physical Exam  GENERAL:  50 y.o.-year-old patient lying in the bed with no acute distress.  HEENT: Head atraumatic, normocephalic. Oropharynx and nasopharynx clear.  NECK:  Supple, no jugular venous distention. No thyroid enlargement, no tenderness.  LUNGS: Normal breath sounds bilaterally, no wheezing, rales, rhonchi. No use of accessory muscles of respiration.  CARDIOVASCULAR: S1, S2 normal. No murmurs, rubs, or gallops.  ABDOMEN: Soft, nontender,  nondistended. Bowel sounds present. No organomegaly or mass.  EXTREMITIES:  NEUROLOGIC: non focal   PSYCHIATRIC:  patient is alert and oriented x 3.  SKIN: as above   LABORATORY PANEL:  CBC Recent Labs  Lab 09/10/20 0406  WBC 18.7*  HGB 9.9*  HCT 31.9*  PLT 307    Chemistries  Recent Labs  Lab 09/09/20 0152 09/10/20 0406  NA 137 137  K 4.0 4.1  CL 105 104  CO2 26 26  GLUCOSE 157* 106*  BUN 7 9  CREATININE 0.41* 0.33*  CALCIUM 8.0* 8.4*  MG 2.1  --    Cardiac Enzymes No results for input(s): TROPONINI in the last 168 hours. RADIOLOGY:  ECHOCARDIOGRAM COMPLETE  Result Date: 09/10/2020    ECHOCARDIOGRAM REPORT   Patient Name:   Alexandra [name] Date of Exam: 09/09/2020 Medical Rec #:  09/11/2020       Height:       63.0 in Accession #:    [id number]      Weight:       125.7 lb Date of Birth:  1970/08/08       BSA:          1.587 m Patient Age:    50 years        BP:           108/62 mmHg Patient Gender: F               HR:           68 bpm. Exam Location:  [facility] Procedure: 2D Echo, Cardiac Doppler and Color Doppler Indications:     Ischemic foot  History:  Patient has no prior history of Echocardiogram examinations.  Sonographer:     Alexandra [name] Referring Phys:  [id number] Alexandra [name] Diagnosing Phys: Alexandra Pea MD IMPRESSIONS  1. Left ventricular ejection fraction, by estimation, is 60 to 65%. Left ventricular ejection fraction by PLAX is 64 %. The left ventricle has normal function. The left ventricle has no regional wall motion abnormalities. Left ventricular diastolic parameters were normal.  2. Right ventricular systolic function is normal. The right ventricular size is normal.  3. The mitral valve is normal in structure. No evidence of mitral valve regurgitation.  4. The aortic valve is normal in structure. Aortic valve regurgitation is not visualized. FINDINGS  Left Ventricle: Left ventricular ejection fraction, by estimation, is 60 to 65%. Left  ventricular ejection fraction by PLAX is 64 %. The left ventricle has normal function. The left ventricle has no regional wall motion abnormalities. The left ventricular internal cavity size was normal in size. There is no left ventricular hypertrophy. Left ventricular diastolic parameters were normal. Right Ventricle: The right ventricular size is normal. No increase in right ventricular wall thickness. Right ventricular systolic function is normal. Left Atrium: Left atrial size was normal in size. Right Atrium: Right atrial size was normal in size. Pericardium: There is no evidence of pericardial effusion. Mitral Valve: The mitral valve is normal in structure. No evidence of mitral valve regurgitation. Tricuspid Valve: The tricuspid valve is normal in structure. Tricuspid valve regurgitation is trivial. Aortic Valve: The aortic valve is normal in structure. Aortic valve regurgitation is not visualized. Pulmonic Valve: The pulmonic valve was normal in structure. Pulmonic valve regurgitation is not visualized. Aorta: The ascending aorta was not well visualized. IAS/Shunts: No atrial level shunt detected by color flow Doppler.  LEFT VENTRICLE PLAX 2D LV EF:         Left            Diastology                ventricular     LV e' medial:    7.18 cm/s                ejection        LV E/e' medial:  15.6                fraction by     LV e' lateral:   9.90 cm/s                PLAX is 64      LV E/e' lateral: 11.3                %. LVIDd:         4.30 cm LVIDs:         2.80 cm LV PW:         0.70 cm LV IVS:        0.60 cm LVOT diam:     1.90 cm LV SV:         60 LV SV Index:   38 LVOT Area:     2.84 cm  RIGHT VENTRICLE             IVC RV Basal diam:  3.30 cm     IVC diam: 1.70 cm RV S prime:     20.90 cm/s TAPSE (M-mode): 2.5 cm LEFT ATRIUM             Index       RIGHT ATRIUM  Index LA diam:        3.80 cm 2.39 cm/m  RA Area:     10.00 cm LA Vol (A2C):   39.0 ml 24.57 ml/m RA Volume:   20.90 ml  13.17 ml/m  LA Vol (A4C):   37.5 ml 23.63 ml/m LA Biplane Vol: 39.0 ml 24.57 ml/m  AORTIC VALVE LVOT Vmax:   93.80 cm/s LVOT Vmean:  65.500 cm/s LVOT VTI:    0.213 m  AORTA Ao Root diam: 3.10 cm Ao Asc diam:  3.20 cm MITRAL VALVE MV Area (PHT): 4.39 cm     SHUNTS MV Decel Time: 173 msec     Systemic VTI:  0.21 m MV E velocity: 112.00 cm/s  Systemic Diam: 1.90 cm MV A velocity: 97.90 cm/s MV E/A ratio:  1.14 Alexandra D Callwood MD Electronically signed by Alexandra Pea MD Signature Date/Time: 09/10/2020/2:06:07 PM    Final    ASSESSMENT AND PLAN:  Alexandra [name] is a 50 y.o. 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 Imaging shows early periosteal reaction which could be reactive periostitis versus early osteomyelitis.   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  Chest x-ray, CT chest with interstitial lung disease. -Concern for possible Buerger's disease with history of smoking. -Right lower extremity ultrasound negative for DVT. -Patient seen by vascular surgery, underwent angiogram without intervention with normal common femoral artery, profunda femoris artery was small but normal, superficial femoral artery and popliteal arteries normal.  Typical tibial trifurcation with three-vessel runoff although all 3 vessels were small with poor flow into the foot and small vessel disease in the foot and ankle. 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 Recieved x1 dose (6/24)--repeat on 09/19/20 --Hypercoagulable work-up/labs ordered per hematology  Right foot ischemia/severe pain -- vascular surgery Alexandra. Wyn Quaker recommends right below knee amputation. Patient is in agreement now. --Continue Lipitor, heparin.  -- Continue current pain medication regimen-- requiring higher doses of narcotics.  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 -Patient being followed by vascular surgery and patient underwent mechanical thrombectomy of the aorta and right common iliac artery with kissing balloon and stent placement to bilateral common iliac arteries per vascular surgery, Alexandra. Wyn Quaker 09/08/2020.   -Continue IV heparin.   -Status post thrombolytic therapy for ischemic foot    Depression -Stable. -Patient tearful with current hospitalization with ischemic foot and concerns for probable amputation to be done during this hospitalization per patient. -It is noted that patient stopped taking home dose bupropion, Cymbalta, ariprazole. -Continue Celexa.   -Outpatient follow-up.   Nicotine dependence -Cessation stressed to patient and patient very motivated to quit a lot of current hospitalization and ischemic foot.   -Nicotine patch.   Iron deficiency anemia -Continue oral iron supplementation.   -Hemoglobin currently at 9.2.   -Transfusion threshold hemoglobin < 7.  Thrombocytosis -Likely reactive. -Trending down.  DVT prophylaxis: Heparin Code Status: Full Family Communication: Updated patient.  No family at bedside. Disposition:    Status is: Inpatient   Remains inpatient appropriate because:IV treatments appropriate due to intensity of illness or inability to take PO   Dispo: The patient is from: Home               Anticipated d/c is to: Home              Patient currently is not medically stable to d/c.              Difficult to place patient No             TOTAL TIME TAKING CARE OF THIS PATIENT: 35 minutes.  >50% time spent on counselling and coordination of care  Note: This dictation was prepared with Dragon dictation along with smaller phrase technology. Any transcriptional errors that result from this process are unintentional.  Enedina FinnerSona Breland Trouten M.D    [facility]   CC: Primary care physician; Center, [facility] Patient ID: Alexandra [name], female   DOB: 1970-10-10, 50 y.o.   MRN: [id number]

## 2020-09-11 ENCOUNTER — Other Ambulatory Visit (INDEPENDENT_AMBULATORY_CARE_PROVIDER_SITE_OTHER): Payer: Self-pay | Admitting: Vascular Surgery

## 2020-09-11 DIAGNOSIS — I70229 Atherosclerosis of native arteries of extremities with rest pain, unspecified extremity: Secondary | ICD-10-CM

## 2020-09-11 LAB — HEPARIN LEVEL (UNFRACTIONATED)
Heparin Unfractionated: 0.13 IU/mL — ABNORMAL LOW (ref 0.30–0.70)
Heparin Unfractionated: 0.19 IU/mL — ABNORMAL LOW (ref 0.30–0.70)
Heparin Unfractionated: 0.34 IU/mL (ref 0.30–0.70)

## 2020-09-11 LAB — CARDIOLIPIN ANTIBODIES, IGG, IGM, IGA
Anticardiolipin IgA: <9 APL U/mL (ref 0–11)
Anticardiolipin IgG: <9 GPL U/mL (ref 0–14)
Anticardiolipin IgM: 24 MPL U/mL — ABNORMAL HIGH (ref 0–12)

## 2020-09-11 MED ORDER — HYDROMORPHONE HCL 1 MG/ML IJ SOLN
1.0000 mg | Freq: Once | INTRAMUSCULAR | Status: AC
  Administered 2020-09-11: 1 mg via INTRAVENOUS
  Filled 2020-09-11: qty 1

## 2020-09-11 MED ORDER — DIPHENHYDRAMINE HCL 50 MG/ML IJ SOLN: 12.5000 mg | Freq: Four times a day (QID) | INTRAMUSCULAR | Status: DC | PRN

## 2020-09-11 MED ORDER — DIPHENHYDRAMINE HCL 12.5 MG/5ML PO ELIX
12.5000 mg | ORAL_SOLUTION | Freq: Four times a day (QID) | ORAL | Status: DC | PRN
  Filled 2020-09-11: qty 5

## 2020-09-11 MED ORDER — MORPHINE SULFATE 1 MG/ML IV SOLN PCA
INTRAVENOUS | Status: DC
  Filled 2020-09-11 (×2): qty 30

## 2020-09-11 MED ORDER — SODIUM CHLORIDE 0.9 % IV SOLN: INTRAVENOUS | Status: DC

## 2020-09-11 MED ORDER — HYDROCODONE-ACETAMINOPHEN 5-325 MG PO TABS
2.0000 | ORAL_TABLET | ORAL | Status: DC | PRN
  Administered 2020-09-11 – 2020-09-16 (×20): 2 via ORAL
  Filled 2020-09-11 (×21): qty 2

## 2020-09-11 MED ORDER — NALOXONE HCL 0.4 MG/ML IJ SOLN
0.4000 mg | INTRAMUSCULAR | Status: DC | PRN
  Filled 2020-09-11: qty 1

## 2020-09-11 MED ORDER — SODIUM CHLORIDE 0.9% FLUSH: 9.0000 mL | INTRAVENOUS | Status: DC | PRN

## 2020-09-11 NOTE — Progress Notes (Signed)
ANTICOAGULATION CONSULT NOTE  Pharmacy Consult for heparin infusion Indication:  VTE treatment  Patient Measurements: Height: 5\' 3"  (160 cm) Weight: 57 kg (125 lb 10.6 oz) IBW/kg (Calculated) : 52.4  Labs: Recent Labs    09/09/20 0152 09/09/20 0847 09/10/20 0406 09/11/20 0456 09/11/20 1327 09/11/20 2151  HGB 9.2*  --  9.9*  --   --   --   HCT 28.5*  --  31.9*  --   --   --   PLT 262  --  307  --   --   --   HEPARINUNFRC 0.22*   < > 0.21* 0.13* 0.19* 0.34  CREATININE 0.41*  --  0.33*  --   --   --    < > = values in this interval not displayed.     Estimated Creatinine Clearance: 68.8 mL/min (A) (by C-G formula based on SCr of 0.33 mg/dL (L)).   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 She was hospitalized 1 month ago and was discharged home on doxycycline which she completed without any significant improvement.  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 Pt underwent angiogram 09/01/20 without intervention. Pharmacy has been consulted for heparin dosing and monitoring. H&H, platelets stable 6/27: mechanical thrombectomy of aortic thrombus and further stenting/revascularizing of right lower limb  6/27  Goal of Therapy:  Heparin level 0.2-0.5 units/ml Monitor platelets by anticoagulation protocol: Yes  06/28 0152 HL 0.22 in desired therapeutic range 06/29 0406 HL 0.21 06/30 0456 HL  0.13. subtherapeutic 06/30 1327 HL 0.19, subtherapeutic 06/30 2151 HL 0.34, therapeutic x 1   Plan:  Heparin level is therapeutic x 1 Continue heparin infusion at 1100 units/hr Recheck HL in 6 hours Daily CBC while on heparin.  2152  09/11/2020 10:16 PM

## 2020-09-11 NOTE — TOC Progression Note (Signed)
Transition of Care [facility]) - Progression Note    Patient Details  Name: Alexandra [name] MRN: [id number] Date of Birth: 30-Sep-1970  Transition of Care [facility]) CM/SW Contact  Chapman Fitch, RN Phone Number: 09/11/2020, 9:41 AM  Clinical Narrative:    Plan for BKA tomorrow. Patient would benefit from PT eval post op        Expected Discharge Plan and Services                                                 Social Determinants of Health (SDOH) Interventions    Readmission Risk Interventions No flowsheet data found.

## 2020-09-11 NOTE — Progress Notes (Signed)
[facility]  - [place] [facility]   PATIENT NAME: Alexandra [name]    MR#:  [id number]  DATE OF BIRTH:  03/29/1970  SUBJECTIVE:  continues with right foot pain. Has significant cyanotic/discoloration. No family at bedside. Recieving significant amount of narcotics every hour Awake and vitals ok REVIEW OF SYSTEMS:   Review of Systems  Constitutional:  Negative for chills, fever and weight loss.  HENT:  Negative for ear discharge, ear pain and nosebleeds.   Eyes:  Negative for blurred vision, pain and discharge.  Respiratory:  Negative for sputum production, shortness of breath, wheezing and stridor.   Cardiovascular:  Negative for chest pain, palpitations, orthopnea and PND.  Gastrointestinal:  Negative for abdominal pain, diarrhea, nausea and vomiting.  Genitourinary:  Negative for frequency and urgency.  Musculoskeletal:  Negative for back pain and joint pain.       Right leg pain  Neurological:  Positive for weakness. Negative for sensory change, speech change and focal weakness.  Psychiatric/Behavioral:  Negative for depression and hallucinations. The patient is not nervous/anxious.   Tolerating Diet: Tolerating PT:   DRUG ALLERGIES:  No Known Allergies  VITALS:  Blood pressure 122/69, pulse 80, temperature 98.7 F (37.1 C), temperature source Oral, resp. rate 16, height 5\' 3"  (1.6 m), weight 57 kg, last menstrual period 01/14/2009, SpO2 97 %.  PHYSICAL EXAMINATION:   Physical Exam  GENERAL:  50 y.o.-year-old patient lying in the bed with no acute distress.  HEENT: Head atraumatic, normocephalic. Oropharynx and nasopharynx clear.  NECK:  Supple, no jugular venous distention. No thyroid enlargement, no tenderness.  LUNGS: Normal breath sounds bilaterally, no wheezing, rales, rhonchi. No use of accessory muscles of respiration.  CARDIOVASCULAR: S1, S2 normal. No murmurs, rubs, or gallops.  ABDOMEN: Soft, nontender, nondistended. Bowel sounds present. No  organomegaly or mass.  EXTREMITIES:  NEUROLOGIC: non focal   PSYCHIATRIC:  patient is alert and oriented x 3.  SKIN: as above   LABORATORY PANEL:  CBC Recent Labs  Lab 09/10/20 0406  WBC 18.7*  HGB 9.9*  HCT 31.9*  PLT 307     Chemistries  Recent Labs  Lab 09/09/20 0152 09/10/20 0406  NA 137 137  K 4.0 4.1  CL 105 104  CO2 26 26  GLUCOSE 157* 106*  BUN 7 9  CREATININE 0.41* 0.33*  CALCIUM 8.0* 8.4*  MG 2.1  --     Cardiac Enzymes No results for input(s): TROPONINI in the last 168 hours. RADIOLOGY:  ECHOCARDIOGRAM COMPLETE  Result Date: 09/10/2020    ECHOCARDIOGRAM REPORT   Patient Name:   Alexandra [name] Date of Exam: 09/09/2020 Medical Rec #:  [id number]       Height:       63.0 in Accession #:    [id number]      Weight:       125.7 lb Date of Birth:  03/29/1970       BSA:          1.587 m Patient Age:    50 years        BP:           108/62 mmHg Patient Gender: F               HR:           68 bpm. Exam Location:  [facility] Procedure: 2D Echo, Cardiac Doppler and Color Doppler Indications:     Ischemic foot  History:  Patient has no prior history of Echocardiogram examinations.  Sonographer:     Sedonia Small Rodgers-Jones Referring Phys:  1937 Lovey Newcomer THOMPSON Diagnosing Phys: Alwyn Pea MD IMPRESSIONS  1. Left ventricular ejection fraction, by estimation, is 60 to 65%. Left ventricular ejection fraction by PLAX is 64 %. The left ventricle has normal function. The left ventricle has no regional wall motion abnormalities. Left ventricular diastolic parameters were normal.  2. Right ventricular systolic function is normal. The right ventricular size is normal.  3. The mitral valve is normal in structure. No evidence of mitral valve regurgitation.  4. The aortic valve is normal in structure. Aortic valve regurgitation is not visualized. FINDINGS  Left Ventricle: Left ventricular ejection fraction, by estimation, is 60 to 65%. Left ventricular ejection fraction by PLAX is 64  %. The left ventricle has normal function. The left ventricle has no regional wall motion abnormalities. The left ventricular internal cavity size was normal in size. There is no left ventricular hypertrophy. Left ventricular diastolic parameters were normal. Right Ventricle: The right ventricular size is normal. No increase in right ventricular wall thickness. Right ventricular systolic function is normal. Left Atrium: Left atrial size was normal in size. Right Atrium: Right atrial size was normal in size. Pericardium: There is no evidence of pericardial effusion. Mitral Valve: The mitral valve is normal in structure. No evidence of mitral valve regurgitation. Tricuspid Valve: The tricuspid valve is normal in structure. Tricuspid valve regurgitation is trivial. Aortic Valve: The aortic valve is normal in structure. Aortic valve regurgitation is not visualized. Pulmonic Valve: The pulmonic valve was normal in structure. Pulmonic valve regurgitation is not visualized. Aorta: The ascending aorta was not well visualized. IAS/Shunts: No atrial level shunt detected by color flow Doppler.  LEFT VENTRICLE PLAX 2D LV EF:         Left            Diastology                ventricular     LV e' medial:    7.18 cm/s                ejection        LV E/e' medial:  15.6                fraction by     LV e' lateral:   9.90 cm/s                PLAX is 64      LV E/e' lateral: 11.3                %. LVIDd:         4.30 cm LVIDs:         2.80 cm LV PW:         0.70 cm LV IVS:        0.60 cm LVOT diam:     1.90 cm LV SV:         60 LV SV Index:   38 LVOT Area:     2.84 cm  RIGHT VENTRICLE             IVC RV Basal diam:  3.30 cm     IVC diam: 1.70 cm RV S prime:     20.90 cm/s TAPSE (M-mode): 2.5 cm LEFT ATRIUM             Index       RIGHT ATRIUM  Index LA diam:        3.80 cm 2.39 cm/m  RA Area:     10.00 cm LA Vol (A2C):   39.0 ml 24.57 ml/m RA Volume:   20.90 ml  13.17 ml/m LA Vol (A4C):   37.5 ml 23.63 ml/m LA  Biplane Vol: 39.0 ml 24.57 ml/m  AORTIC VALVE LVOT Vmax:   93.80 cm/s LVOT Vmean:  65.500 cm/s LVOT VTI:    0.213 m  AORTA Ao Root diam: 3.10 cm Ao Asc diam:  3.20 cm MITRAL VALVE MV Area (PHT): 4.39 cm     SHUNTS MV Decel Time: 173 msec     Systemic VTI:  0.21 m MV E velocity: 112.00 cm/s  Systemic Diam: 1.90 cm MV A velocity: 97.90 cm/s MV E/A ratio:  1.14 Dwayne D Callwood MD Electronically signed by Alwyn Pea MD Signature Date/Time: 09/10/2020/2:06:07 PM    Final    ASSESSMENT AND PLAN:  Alexandra [name] is a 50 y.o. 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 Imaging shows early periosteal reaction which could be reactive periostitis versus early osteomyelitis.   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  Chest x-ray, CT chest with interstitial lung disease. -Concern for possible Buerger's disease with history of smoking. -Right lower extremity ultrasound negative for DVT. -Patient seen by vascular surgery, underwent angiogram without intervention with normal common femoral artery, profunda femoris artery was small but normal, superficial femoral artery and popliteal arteries normal.  Typical tibial trifurcation with three-vessel runoff although all 3 vessels were small with poor flow into the foot and small vessel disease in the foot and ankle. 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 Recieved x1 dose (6/24)--repeat on 09/19/20 --Hypercoagulable work-up/labs ordered per hematology  Right foot ischemia/severe pain -- vascular surgery Dr. Wyn Quaker recommends right below knee amputation. Patient is in agreement now.--scheduled for tomorrow 09/12/20 --Continue Lipitor, heparin.  -- Continue current pain medication regimen-- requiring higher doses of narcotics. --6/30--start IV Morphine PCA pump--pt is agreement. 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 -Patient being followed by vascular surgery and patient underwent mechanical thrombectomy of the aorta and right common iliac artery with kissing balloon and stent placement to bilateral common iliac arteries per vascular surgery, Dr. Wyn Quaker 09/08/2020.   -Continue IV heparin.   -Status post thrombolytic therapy for ischemic foot    Depression -Stable. -Patient tearful with current hospitalization with ischemic foot and concerns for probable amputation to be done during this hospitalization per patient. -It is noted that patient stopped taking home dose bupropion, Cymbalta, ariprazole. -Continue Celexa.   -Outpatient follow-up.   Nicotine dependence -Cessation stressed to patient and patient very motivated to quit a lot of current hospitalization and ischemic foot.   -Nicotine patch.   Iron deficiency anemia -Continue oral iron supplementation.   -Hemoglobin currently at 9.2.   -  Transfusion threshold hemoglobin < 7.  Thrombocytosis -Likely reactive. -Trending down.      DVT prophylaxis: Heparin Code Status: Full Family Communication: Updated patient.  No family at bedside. Disposition:    Status is: Inpatient   Remains inpatient appropriate because:IV treatments appropriate due to intensity of illness or inability  to take PO   Dispo: The patient is from: Home              Anticipated d/c is to: Home              Patient currently is not medically stable to d/c.              Difficult to place patient No         TOTAL TIME TAKING CARE OF THIS PATIENT: 25 minutes.  >50% time spent on counselling and coordination of care  Note: This dictation was prepared with Dragon dictation along with smaller phrase technology. Any transcriptional errors that result from this process are unintentional.  [name] M.D    [facility]   CC: Primary care physician; Center, [facility] Patient ID: CARLEAH YABLONSKI, female   DOB: 02-Jun-1970, 50 y.o.   MRN: [id number]

## 2020-09-11 NOTE — Progress Notes (Signed)
[facility] Daily Progress Note  09/08/20:             1.  Ultrasound guidance for vascular access left femoral artery             2.  Catheter placement into aorta from bilateral femoral approaches             3.  Right femoral artery cutdown             4.  Mechanical thrombectomy of the aorta and right common iliac artery with the 8 French R[place] Rex device             5.  Kissing balloon stent placement to bilateral common iliac arteries and the distal aorta with 7 mm diameter by 26 mm length stent on the left and 8 mm diameter by 37 mm length stent on the right which was postdilated with a 9 to 10 mm balloon distally             6.   StarClose closure device left femoral artery  Subjective: Patient without complaint. No issues overnight.   Objective: Vitals:   09/10/20 1800 09/10/20 1947 09/11/20 0451 09/11/20 0845  BP: (!) 87/58 (!) 100/51 (!) 128/55 122/69  Pulse: 77 72 77 80  Resp: 16 16 15 16  Temp:  98.2 F (36.8 C) 98.9 F (37.2 C) 98.7 F (37.1 C)  TempSrc:  Oral Oral Oral  SpO2: 97% 98% 96% 97%  Weight:      Height:        Intake/Output Summary (Last 24 hours) at 09/11/2020 1002 Last data filed at 09/11/2020 0339 Gross per 24 hour  Intake 760.78 ml  Output 700 ml  Net 60.78 ml   Physical Exam: A&Ox3, NAD CV: RRR Pulmonary: CTA Bilaterally Abdomen: Soft, Nontender, Nondistended Vascular:  Right Lower Extremity: Thigh soft. Calf soft. Foot stable with mottled appearance.    Laboratory: CBC    Component Value Date/Time   WBC 18.7 (H) 09/10/2020 0406   HGB 9.9 (L) 09/10/2020 0406   HGB 13.5 11/08/2013 1424   HCT 31.9 (L) 09/10/2020 0406   HCT 42.1 11/08/2013 1424   PLT 307 09/10/2020 0406   PLT 304 11/08/2013 1424   BMET    Component Value Date/Time   NA 137 09/10/2020 0406   NA 133 (L) 11/08/2013 1424   K 4.1 09/10/2020 0406   K 3.7 11/08/2013 1424   CL 104 09/10/2020 0406   CL 100 11/08/2013 1424   CO2 26 09/10/2020 0406    CO2 23 11/08/2013 1424   GLUCOSE 106 (H) 09/10/2020 0406   GLUCOSE 92 11/08/2013 1424   BUN 9 09/10/2020 0406   BUN 10 11/08/2013 1424   CREATININE 0.33 (L) 09/10/2020 0406   CREATININE 0.73 11/08/2013 1424   CALCIUM 8.4 (L) 09/10/2020 0406   CALCIUM 8.3 (L) 11/08/2013 1424   GFRNONAA >60 09/10/2020 0406   GFRNONAA >60 11/08/2013 1424   GFRAA >60 03/25/2017 1955   GFRAA >60 11/08/2013 1424   Assessment/Planning: The patient is a 50 year old female with 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 Dew 2) Procedure, risks and benefit explained to the patient. All questions. Patient wishes to proceed. 3) Heparin to stop 6 hours before surgery (6am).  Discussed with Dr. Dew  Diamone Whistler PA-C 09/11/2020 10:02 AM   

## 2020-09-11 NOTE — Progress Notes (Signed)
ANTICOAGULATION CONSULT NOTE  Pharmacy Consult for heparin infusion Indication:  VTE treatment  No Known Allergies  Patient Measurements: Height: 5\' 3"  (160 cm) Weight: 57 kg (125 lb 10.6 oz) IBW/kg (Calculated) : 52.4  Vital Signs: Temp: 98.7 F (37.1 C) (06/30 0845) Temp Source: Oral (06/30 0845) BP: 122/69 (06/30 0845) Pulse Rate: 80 (06/30 0845)  Labs: Recent Labs    09/09/20 0152 09/09/20 0847 09/10/20 0406 09/11/20 0456 09/11/20 1327  HGB 9.2*  --  9.9*  --   --   HCT 28.5*  --  31.9*  --   --   PLT 262  --  307  --   --   HEPARINUNFRC 0.22*   < > 0.21* 0.13* 0.19*  CREATININE 0.41*  --  0.33*  --   --    < > = values in this interval not displayed.     Estimated Creatinine Clearance: 68.8 mL/min (A) (by C-G formula based on SCr of 0.33 mg/dL (L)).   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 She was hospitalized 1 month ago and was discharged home on doxycycline which she completed without any significant improvement.  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 Pt underwent angiogram 09/01/20 without intervention. Pharmacy has been consulted for heparin dosing and monitoring. H&H, platelets stable 6/27: mechanical thrombectomy of aortic thrombus and further stenting/revascularizing of right lower  limb  6/27  Goal of Therapy:  Heparin level 0.2-0.5 units/ml Monitor platelets by anticoagulation protocol: Yes  06/28 0152 HL 0.22 in desired therapeutic range 06/29 0406 HL 0.21 06/30 0456 HL 0.13. subtherapeutic 06/30 1327 HL 0.19, subtherapeutic   Plan:  Increase heparin infusion to 1100 units/hr Recheck HL in 6 hours after rate change Daily CBC while on heparin.  7/30, PharmD, BCPS 09/11/2020 3:49 PM

## 2020-09-11 NOTE — H&P (View-Only) (Signed)
[facility] Daily Progress Note  09/08/20:             1.  Ultrasound guidance for vascular access left femoral artery             2.  Catheter placement into aorta from bilateral femoral approaches             3.  Right femoral artery cutdown             4.  Mechanical thrombectomy of the aorta and right common iliac artery with the 8 [place] Rex device             5.  Kissing balloon stent placement to bilateral common iliac arteries and the distal aorta with 7 mm diameter by 26 mm length stent on the left and 8 mm diameter by 37 mm length stent on the right which was postdilated with a 9 to 10 mm balloon distally             6.   StarClose closure device left femoral artery  Subjective: Patient without complaint. No issues overnight.   Objective: Vitals:   09/10/20 1800 09/10/20 1947 09/11/20 0451 09/11/20 0845  BP: (!) 87/58 (!) 100/51 (!) 128/55 122/69  Pulse: 77 72 77 80  Resp: 16 16 15 16   Temp:  98.2 F (36.8 C) 98.9 F (37.2 C) 98.7 F (37.1 C)  TempSrc:  Oral Oral Oral  SpO2: 97% 98% 96% 97%  Weight:      Height:        Intake/Output Summary (Last 24 hours) at 09/11/2020 1002 Last data filed at 09/11/2020 09/13/2020 Gross per 24 hour  Intake 760.78 ml  Output 700 ml  Net 60.78 ml   Physical Exam: A&Ox3, NAD CV: RRR Pulmonary: CTA Bilaterally Abdomen: Soft, Nontender, Nondistended Vascular:  Right Lower Extremity: Thigh soft. Calf soft. Foot stable with mottled appearance.    Laboratory: CBC    Component Value Date/Time   WBC 18.7 (H) 09/10/2020 0406   HGB 9.9 (L) 09/10/2020 0406   HGB 13.5 11/08/2013 1424   HCT 31.9 (L) 09/10/2020 0406   HCT 42.1 11/08/2013 1424   PLT 307 09/10/2020 0406   PLT 304 11/08/2013 1424   BMET    Component Value Date/Time   NA 137 09/10/2020 0406   NA 133 (L) 11/08/2013 1424   K 4.1 09/10/2020 0406   K 3.7 11/08/2013 1424   CL 104 09/10/2020 0406   CL 100 11/08/2013 1424   CO2 26 09/10/2020 0406    CO2 23 11/08/2013 1424   GLUCOSE 106 (H) 09/10/2020 0406   GLUCOSE 92 11/08/2013 1424   BUN 9 09/10/2020 0406   BUN 10 11/08/2013 1424   CREATININE 0.33 (L) 09/10/2020 0406   CREATININE 0.73 11/08/2013 1424   CALCIUM 8.4 (L) 09/10/2020 0406   CALCIUM 8.3 (L) 11/08/2013 1424   GFRNONAA >60 09/10/2020 0406   GFRNONAA >60 11/08/2013 1424   GFRAA >60 03/25/2017 1955   GFRAA >60 11/08/2013 1424   Assessment/Planning: The patient is a 50 year old female with 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 44 2) Procedure, risks and benefit explained to the patient. All questions. Patient wishes to proceed. 3) Heparin to stop 6 hours before surgery (6am).  Discussed with Dr. Wyn Quaker Jasimine Simms PA-C 09/11/2020 10:02 AM

## 2020-09-11 NOTE — Progress Notes (Addendum)
PT in bed No distress Says she has anew blister developing rt foot No fever No cough No diarrhea No pain abdomen Has pain rt foot  BP 118/79 (BP Location: Right Arm)   Pulse 87   Temp 98.3 F (36.8 C) (Oral)   Resp 16   Ht 5\' 3"  (1.6 m)   Wt 57 kg   LMP 01/14/2009   SpO2 95%   BMI 22.26 kg/m    Awake and alert Pale Chest b/l air entry Hss1s2 Abd soft Rt femoral dressing intact- no erythema or tenderness at the site  Rt foot more purple There is a large flaccid blister on the dorsal aspect of foot Rt lateral ulcer near ankle covered with dressing   Labs CBC Latest Ref Rng & Units 09/10/2020 09/09/2020 09/08/2020  WBC 4.0 - 10.5 K/uL 18.7(H) 13.8(H) 14.4(H)  Hemoglobin 12.0 - 15.0 g/dL 09/10/2020) 9.7(W) 11.0(L)  Hematocrit 36.0 - 46.0 % 31.9(L) 28.5(L) 35.5(L)  Platelets 150 - 400 K/uL 307 262 308     CMP Latest Ref Rng & Units 09/10/2020 09/09/2020 09/08/2020  Glucose 70 - 99 mg/dL 09/10/2020) 785(Y) 850(Y)  BUN 6 - 20 mg/dL 9 7 11   Creatinine 0.44 - 1.00 mg/dL 774(J) ) 2.87(O)  Sodium 135 - 145 mmol/L 137 137 135  Potassium 3.5 - 5.1 mmol/L 4.1 4.0 4.0  Chloride 98 - 111 mmol/L 104 105 104  CO2 22 - 32 mmol/L 26 26 26   Calcium 8.9 - 10.3 mg/dL 6.76(H) 2.09(O) 7.9(L)  Total Protein 6.5 - 8.1 g/dL - - -  Total Bilirubin 0.3 - 1.2 mg/dL - - -  Alkaline Phos 38 - 126 U/L - - -  AST 15 - 41 U/L - - -  ALT 0 - 44 U/L - - -    Impression/recommendation  Critical ischemia rt foot Awaiting BKA tomorrow. She is mentally prepared to have the BKA  Aortic thrombus- thrombophilia workup neg so far  Vasculitis P anca positive causing ulcer, lung findings- on steroids ( received 1 dose of rituximab)- followed by rheumatology  Vaculitis ulcer- colonized with MRSA/steno On bactrim If prophylaxis is to be given for BKA it will have to be MRSA coverage with vanco  Positive rheumatoid factor  Discussed the management with aptient

## 2020-09-11 NOTE — Progress Notes (Signed)
Pt complaining that PCA regimen is ineffective. States that pain regimen 6/29 night worked better. Pt requesting to go back to IV 3 mg dilaudid and 4 mg morphine, with po vicodin. Provider Mansy notified. RN acquired IV dilaudid 1 mg q4h prn order. Provider states he will inform MD Allena Katz about pt request regarding pain regimen. RN will pass info to day RN. Pt given IV dilaudid, PCA syringe replaced. Pt states she would like po vicodin when available. C/o 8/10 pain at this time. Will continue to monitor.

## 2020-09-11 NOTE — Progress Notes (Signed)
ANTICOAGULATION CONSULT NOTE  Pharmacy Consult for heparin infusion Indication:  VTE treatment  No Known Allergies  Patient Measurements: Height: 5\' 3"  (160 cm) Weight: 57 kg (125 lb 10.6 oz) IBW/kg (Calculated) : 52.4  Vital Signs: Temp: 98.9 F (37.2 C) (06/30 0451) Temp Source: Oral (06/30 0451) BP: 128/55 (06/30 0451) Pulse Rate: 77 (06/30 0451)  Labs: Recent Labs    09/08/20 0759 09/08/20 1039 09/09/20 0152 09/09/20 0847 09/10/20 0406 09/11/20 0456  HGB 11.0*  --  9.2*  --  9.9*  --   HCT 35.5*  --  28.5*  --  31.9*  --   PLT 308  --  262  --  307  --   HEPARINUNFRC  --    < > 0.22* 0.28* 0.21* 0.13*  CREATININE  --   --  0.41*  --  0.33*  --    < > = values in this interval not displayed.     Estimated Creatinine Clearance: 68.8 mL/min (A) (by C-G formula based on SCr of 0.33 mg/dL (L)).   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 She was hospitalized 1 month ago and was discharged home on doxycycline which she completed without any significant improvement.  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 Pt underwent angiogram 09/01/20 without intervention. Pharmacy has been consulted for heparin dosing and monitoring. H&H, platelets stable 6/27: mechanical thrombectomy of aortic thrombus and further  stenting/revascularizing of right lower limb  6/27  Goal of Therapy:  Heparin level 0.2-0.5 units/ml Monitor platelets by anticoagulation protocol: Yes  06/28 0152 HL 0.22 in desired therapeutic range 06/29 0406 HL 0.21 06/30 0456 HL 0.13. subtherapeutic   Plan:  Increase heparin infusion to 1000 units/hr Recheck HL in 6 hours after rate change Daily CBC while on heparin.  7/30, PharmD, MBA 09/11/2020 7:01 AM

## 2020-09-12 ENCOUNTER — Inpatient Hospital Stay: Payer: Medicaid Other | Admitting: Anesthesiology

## 2020-09-12 ENCOUNTER — Encounter
Admission: EM | Disposition: A | Discharge status: Home Health | Payer: Self-pay | Source: Home / Self Care | Attending: Internal Medicine

## 2020-09-12 ENCOUNTER — Encounter: Payer: Self-pay | Admitting: Internal Medicine

## 2020-09-12 DIAGNOSIS — L97912 Non-pressure chronic ulcer of unspecified part of right lower leg with fat layer exposed: Secondary | ICD-10-CM

## 2020-09-12 DIAGNOSIS — A4902 Methicillin resistant Staphylococcus aureus infection, unspecified site: Secondary | ICD-10-CM

## 2020-09-12 DIAGNOSIS — I96 Gangrene, not elsewhere classified: Secondary | ICD-10-CM

## 2020-09-12 HISTORY — PX: AMPUTATION: SHX166

## 2020-09-12 LAB — CBC WITH DIFFERENTIAL/PLATELET
Abs Immature Granulocytes: 0.27 10*3/uL — ABNORMAL HIGH (ref 0.00–0.07)
Basophils Absolute: 0.1 10*3/uL (ref 0.0–0.1)
Basophils Relative: 0 %
Eosinophils Absolute: 0 10*3/uL (ref 0.0–0.5)
Eosinophils Relative: 0 %
HCT: 30.9 % — ABNORMAL LOW (ref 36.0–46.0)
Hemoglobin: 10.1 g/dL — ABNORMAL LOW (ref 12.0–15.0)
Immature Granulocytes: 1 %
Lymphocytes Relative: 11 %
Lymphs Abs: 2.8 10*3/uL (ref 0.7–4.0)
MCH: 24.3 pg — ABNORMAL LOW (ref 26.0–34.0)
MCHC: 32.7 g/dL (ref 30.0–36.0)
MCV: 74.5 fL — ABNORMAL LOW (ref 80.0–100.0)
Monocytes Absolute: 1.9 10*3/uL — ABNORMAL HIGH (ref 0.1–1.0)
Monocytes Relative: 8 %
Neutro Abs: 19.6 10*3/uL — ABNORMAL HIGH (ref 1.7–7.7)
Neutrophils Relative %: 80 %
Platelets: 392 10*3/uL (ref 150–400)
RBC: 4.15 MIL/uL (ref 3.87–5.11)
RDW: 19.5 % — ABNORMAL HIGH (ref 11.5–15.5)
WBC: 24.6 10*3/uL — ABNORMAL HIGH (ref 4.0–10.5)
nRBC: 0 % (ref 0.0–0.2)

## 2020-09-12 LAB — HEPARIN LEVEL (UNFRACTIONATED): Heparin Unfractionated: 0.18 IU/mL — ABNORMAL LOW (ref 0.30–0.70)

## 2020-09-12 LAB — FACTOR 5 LEIDEN

## 2020-09-12 LAB — BASIC METABOLIC PANEL
Anion gap: 10 (ref 5–15)
BUN: 14 mg/dL (ref 6–20)
CO2: 30 mmol/L (ref 22–32)
Calcium: 8.7 mg/dL — ABNORMAL LOW (ref 8.9–10.3)
Chloride: 99 mmol/L (ref 98–111)
Creatinine, Ser: 0.49 mg/dL (ref 0.44–1.00)
GFR, Estimated: >60 mL/min (ref >60)
Glucose, Bld: 89 mg/dL (ref 70–99)
Potassium: 3.6 mmol/L (ref 3.5–5.1)
Sodium: 139 mmol/L (ref 135–145)

## 2020-09-12 LAB — MAGNESIUM: Magnesium: 2.2 mg/dL (ref 1.7–2.4)

## 2020-09-12 LAB — PROTIME-INR
INR: 1 (ref 0.8–1.2)
Prothrombin Time: 12.8 seconds (ref 11.4–15.2)

## 2020-09-12 LAB — APTT: aPTT: 30 seconds (ref 24–36)

## 2020-09-12 SURGERY — AMPUTATION BELOW KNEE
Anesthesia: General | Site: Knee | Laterality: Right

## 2020-09-12 MED ORDER — ONDANSETRON HCL 4 MG/2ML IJ SOLN
INTRAMUSCULAR | Status: AC
  Filled 2020-09-12: qty 2

## 2020-09-12 MED ORDER — CEFAZOLIN SODIUM-DEXTROSE 2-4 GM/100ML-% IV SOLN
2.0000 g | INTRAVENOUS | Status: AC
  Administered 2020-09-12: 2 g via INTRAVENOUS

## 2020-09-12 MED ORDER — ACETAMINOPHEN 10 MG/ML IV SOLN
INTRAVENOUS | Status: DC | PRN
  Administered 2020-09-12: 500 mg via INTRAVENOUS

## 2020-09-12 MED ORDER — FENTANYL CITRATE (PF) 100 MCG/2ML IJ SOLN
INTRAMUSCULAR | Status: AC
  Administered 2020-09-12: 50 ug via INTRAVENOUS
  Filled 2020-09-12: qty 2

## 2020-09-12 MED ORDER — HYDROMORPHONE HCL 1 MG/ML IJ SOLN
INTRAMUSCULAR | Status: AC
  Administered 2020-09-12: 0.5 mg via INTRAVENOUS
  Filled 2020-09-12: qty 1

## 2020-09-12 MED ORDER — KETAMINE HCL 50 MG/5ML IJ SOSY
PREFILLED_SYRINGE | INTRAMUSCULAR | Status: AC
  Filled 2020-09-12: qty 5

## 2020-09-12 MED ORDER — OXYCODONE HCL 5 MG PO TABS
5.0000 mg | ORAL_TABLET | Freq: Once | ORAL | Status: DC | PRN
Start: 2020-09-12 — End: 2020-09-12

## 2020-09-12 MED ORDER — VANCOMYCIN HCL 1250 MG/250ML IV SOLN
1250.0000 mg | Freq: Every day | INTRAVENOUS | Status: DC
  Administered 2020-09-13 – 2020-09-15 (×3): 1250 mg via INTRAVENOUS
  Filled 2020-09-12 (×5): qty 250

## 2020-09-12 MED ORDER — ROCURONIUM BROMIDE 10 MG/ML (PF) SYRINGE
PREFILLED_SYRINGE | INTRAVENOUS | Status: AC
  Filled 2020-09-12: qty 10

## 2020-09-12 MED ORDER — FENTANYL CITRATE (PF) 100 MCG/2ML IJ SOLN
INTRAMUSCULAR | Status: AC
  Administered 2020-09-12: 25 ug via INTRAVENOUS
  Filled 2020-09-12: qty 2

## 2020-09-12 MED ORDER — PROMETHAZINE HCL 25 MG/ML IJ SOLN
6.2500 mg | INTRAMUSCULAR | Status: DC | PRN
Start: 2020-09-12 — End: 2020-09-12
  Administered 2020-09-12: 6.25 mg via INTRAVENOUS

## 2020-09-12 MED ORDER — ONDANSETRON HCL 4 MG/2ML IJ SOLN
INTRAMUSCULAR | Status: DC | PRN
  Administered 2020-09-12: 4 mg via INTRAVENOUS

## 2020-09-12 MED ORDER — LIDOCAINE HCL (PF) 2 % IJ SOLN
INTRAMUSCULAR | Status: AC
  Filled 2020-09-12: qty 5

## 2020-09-12 MED ORDER — LACTATED RINGERS IV SOLN: INTRAVENOUS | Status: DC | PRN

## 2020-09-12 MED ORDER — FENTANYL CITRATE (PF) 100 MCG/2ML IJ SOLN
INTRAMUSCULAR | Status: DC | PRN
  Administered 2020-09-12: 50 ug via INTRAVENOUS
  Administered 2020-09-12: 25 ug via INTRAVENOUS
  Administered 2020-09-12: 100 ug via INTRAVENOUS
  Administered 2020-09-12: 25 ug via INTRAVENOUS

## 2020-09-12 MED ORDER — LIDOCAINE HCL (CARDIAC) PF 100 MG/5ML IV SOSY
PREFILLED_SYRINGE | INTRAVENOUS | Status: DC | PRN
  Administered 2020-09-12: 40 mg via INTRAVENOUS

## 2020-09-12 MED ORDER — CHLORHEXIDINE GLUCONATE CLOTH 2 % EX PADS: 6.0000 | MEDICATED_PAD | Freq: Once | CUTANEOUS | Status: AC

## 2020-09-12 MED ORDER — 0.9 % SODIUM CHLORIDE (POUR BTL) OPTIME
TOPICAL | Status: DC | PRN
  Administered 2020-09-12: 500 mL

## 2020-09-12 MED ORDER — HYDROMORPHONE HCL 1 MG/ML IJ SOLN
0.2500 mg | INTRAMUSCULAR | Status: DC | PRN
Start: 2020-09-12 — End: 2020-09-12
  Administered 2020-09-12 (×2): 0.5 mg via INTRAVENOUS

## 2020-09-12 MED ORDER — HYDROMORPHONE HCL 1 MG/ML IJ SOLN
1.0000 mg | INTRAMUSCULAR | Status: DC | PRN
  Administered 2020-09-12: 1 mg via INTRAVENOUS
  Filled 2020-09-12: qty 1

## 2020-09-12 MED ORDER — FENTANYL CITRATE (PF) 100 MCG/2ML IJ SOLN
25.0000 ug | INTRAMUSCULAR | Status: DC | PRN
  Administered 2020-09-12 (×2): 50 ug via INTRAVENOUS
  Administered 2020-09-12: 25 ug via INTRAVENOUS

## 2020-09-12 MED ORDER — PROMETHAZINE HCL 25 MG/ML IJ SOLN
INTRAMUSCULAR | Status: AC
  Filled 2020-09-12: qty 1

## 2020-09-12 MED ORDER — KETAMINE HCL 50 MG/ML IJ SOLN
INTRAMUSCULAR | Status: DC | PRN
  Administered 2020-09-12: 20 mg via INTRAMUSCULAR

## 2020-09-12 MED ORDER — OXYCODONE HCL 5 MG/5ML PO SOLN: 5.0000 mg | Freq: Once | ORAL | Status: DC | PRN

## 2020-09-12 MED ORDER — DEXAMETHASONE SODIUM PHOSPHATE 10 MG/ML IJ SOLN
INTRAMUSCULAR | Status: DC | PRN
  Administered 2020-09-12: 8 mg via INTRAVENOUS

## 2020-09-12 MED ORDER — VANCOMYCIN HCL IN DEXTROSE 1-5 GM/200ML-% IV SOLN
1000.0000 mg | INTRAVENOUS | Status: AC
  Administered 2020-09-12: 1000 mg via INTRAVENOUS

## 2020-09-12 MED ORDER — SUGAMMADEX SODIUM 200 MG/2ML IV SOLN
INTRAVENOUS | Status: DC | PRN
  Administered 2020-09-12: 200 mg via INTRAVENOUS

## 2020-09-12 MED ORDER — PROPOFOL 10 MG/ML IV BOLUS
INTRAVENOUS | Status: AC
  Filled 2020-09-12: qty 20

## 2020-09-12 MED ORDER — VANCOMYCIN HCL IN DEXTROSE 1-5 GM/200ML-% IV SOLN
INTRAVENOUS | Status: AC
  Filled 2020-09-12: qty 200

## 2020-09-12 MED ORDER — ACETAMINOPHEN 10 MG/ML IV SOLN
INTRAVENOUS | Status: AC
  Filled 2020-09-12: qty 100

## 2020-09-12 MED ORDER — PHENYLEPHRINE HCL (PRESSORS) 10 MG/ML IV SOLN
INTRAVENOUS | Status: DC | PRN
  Administered 2020-09-12 (×2): 100 ug via INTRAVENOUS

## 2020-09-12 MED ORDER — HYDROMORPHONE HCL 1 MG/ML IJ SOLN
INTRAMUSCULAR | Status: AC
  Filled 2020-09-12: qty 1

## 2020-09-12 MED ORDER — FENTANYL CITRATE (PF) 100 MCG/2ML IJ SOLN
INTRAMUSCULAR | Status: AC
  Filled 2020-09-12: qty 2

## 2020-09-12 MED ORDER — HYDROMORPHONE HCL 1 MG/ML IJ SOLN
1.0000 mg | INTRAMUSCULAR | Status: DC | PRN
  Administered 2020-09-13 – 2020-09-15 (×6): 1 mg via INTRAVENOUS
  Filled 2020-09-12 (×6): qty 1

## 2020-09-12 MED ORDER — HYDROMORPHONE HCL 1 MG/ML IJ SOLN
3.0000 mg | INTRAMUSCULAR | Status: DC | PRN
  Administered 2020-09-12: 3 mg via INTRAVENOUS
  Filled 2020-09-12: qty 3

## 2020-09-12 MED ORDER — APIXABAN 5 MG PO TABS
5.0000 mg | ORAL_TABLET | Freq: Two times a day (BID) | ORAL | Status: DC
  Administered 2020-09-13 – 2020-09-16 (×7): 5 mg via ORAL
  Filled 2020-09-12 (×8): qty 1

## 2020-09-12 MED ORDER — MIDAZOLAM HCL 2 MG/2ML IJ SOLN
INTRAMUSCULAR | Status: DC | PRN
  Administered 2020-09-12: 2 mg via INTRAVENOUS

## 2020-09-12 MED ORDER — SODIUM CHLORIDE FLUSH 0.9 % IV SOLN
INTRAVENOUS | Status: AC
  Filled 2020-09-12: qty 10

## 2020-09-12 MED ORDER — MIDAZOLAM HCL 2 MG/2ML IJ SOLN
INTRAMUSCULAR | Status: AC
  Filled 2020-09-12: qty 2

## 2020-09-12 MED ORDER — CEFAZOLIN SODIUM-DEXTROSE 2-4 GM/100ML-% IV SOLN
INTRAVENOUS | Status: AC
  Filled 2020-09-12: qty 100

## 2020-09-12 MED ORDER — PROPOFOL 10 MG/ML IV BOLUS
INTRAVENOUS | Status: DC | PRN
  Administered 2020-09-12: 120 mg via INTRAVENOUS

## 2020-09-12 MED ORDER — ROCURONIUM BROMIDE 100 MG/10ML IV SOLN
INTRAVENOUS | Status: DC | PRN
  Administered 2020-09-12: 40 mg via INTRAVENOUS

## 2020-09-12 SURGICAL SUPPLY — 41 items
APL PRP STRL LF DISP 70% ISPRP (MISCELLANEOUS) ×1
BLADE SAW SAG 25.4X90 (BLADE) ×2 IMPLANT
BNDG CMPR STD VLCR NS LF 5.8X6 (GAUZE/BANDAGES/DRESSINGS) ×1
BNDG COHESIVE 4X5 TAN STRL (GAUZE/BANDAGES/DRESSINGS) ×2 IMPLANT
BNDG ELASTIC 6X5.8 VLCR NS LF (GAUZE/BANDAGES/DRESSINGS) ×2 IMPLANT
BNDG ELASTIC 6X5.8 VLCR STR LF (GAUZE/BANDAGES/DRESSINGS) ×2 IMPLANT
BNDG GAUZE ELAST 4 BULKY (GAUZE/BANDAGES/DRESSINGS) ×2 IMPLANT
BRUSH SCRUB EZ  4% CHG (MISCELLANEOUS) ×1
BRUSH SCRUB EZ 4% CHG (MISCELLANEOUS) ×1 IMPLANT
CHLORAPREP W/TINT 26 (MISCELLANEOUS) ×2 IMPLANT
DRAPE INCISE IOBAN 66X45 STRL (DRAPES) IMPLANT
ELECT CAUTERY BLADE 6.4 (BLADE) ×2 IMPLANT
ELECT REM PT RETURN 9FT ADLT (ELECTROSURGICAL) ×2
ELECTRODE REM PT RTRN 9FT ADLT (ELECTROSURGICAL) ×1 IMPLANT
GAUZE 4X4 16PLY ~~LOC~~+RFID DBL (SPONGE) ×2 IMPLANT
GAUZE XEROFORM 1X8 LF (GAUZE/BANDAGES/DRESSINGS) ×2 IMPLANT
GLOVE SURG ENC MOIS LTX SZ7 (GLOVE) ×2 IMPLANT
GLOVE SURG SYN 7.0 (GLOVE) ×2 IMPLANT
GLOVE SURG UNDER LTX SZ7.5 (GLOVE) ×2 IMPLANT
GOWN STRL REUS W/ TWL LRG LVL3 (GOWN DISPOSABLE) ×1 IMPLANT
GOWN STRL REUS W/ TWL XL LVL3 (GOWN DISPOSABLE) ×2 IMPLANT
GOWN STRL REUS W/TWL LRG LVL3 (GOWN DISPOSABLE) ×2
GOWN STRL REUS W/TWL XL LVL3 (GOWN DISPOSABLE) ×4
HANDLE YANKAUER SUCT BULB TIP (MISCELLANEOUS) ×2 IMPLANT
KIT TURNOVER KIT A (KITS) ×2 IMPLANT
LABEL OR SOLS (LABEL) ×2 IMPLANT
MANIFOLD NEPTUNE II (INSTRUMENTS) ×2 IMPLANT
NS IRRIG 500ML POUR BTL (IV SOLUTION) ×2 IMPLANT
PACK EXTREMITY ARMC (MISCELLANEOUS) ×2 IMPLANT
PAD ABD DERMACEA PRESS 5X9 (GAUZE/BANDAGES/DRESSINGS) ×2 IMPLANT
PAD PREP 24X41 OB/GYN DISP (PERSONAL CARE ITEMS) ×2 IMPLANT
SPONGE T-LAP 18X18 ~~LOC~~+RFID (SPONGE) ×2 IMPLANT
STAPLER SKIN PROX 35W (STAPLE) ×2 IMPLANT
STOCKINETTE M/LG 89821 (MISCELLANEOUS) ×2 IMPLANT
SUT SILK 2 0 (SUTURE) ×2
SUT SILK 2 0 SH (SUTURE) ×4 IMPLANT
SUT SILK 2-0 18XBRD TIE 12 (SUTURE) ×1 IMPLANT
SUT SILK 3 0 (SUTURE) ×2
SUT SILK 3-0 18XBRD TIE 12 (SUTURE) ×1 IMPLANT
SUT VIC AB 0 CT1 36 (SUTURE) ×4 IMPLANT
SUT VIC AB 2-0 CT1 (SUTURE) ×4 IMPLANT

## 2020-09-12 NOTE — Anesthesia Postprocedure Evaluation (Signed)
Anesthesia Post Note  Patient: Alexandra [name]  Procedure(s) Performed: AMPUTATION BELOW KNEE (Right: Knee)  Patient location during evaluation: PACU Anesthesia Type: General Level of consciousness: awake and alert Pain management: pain level controlled Vital Signs Assessment: post-procedure vital signs reviewed and stable Respiratory status: spontaneous breathing, nonlabored ventilation, respiratory function stable and patient connected to nasal cannula oxygen Cardiovascular status: blood pressure returned to baseline and stable Postop Assessment: no apparent nausea or vomiting Anesthetic complications: no   No notable events documented.   Last Vitals:  Vitals:   09/12/20 1415 09/12/20 1430  BP: (!) 146/73 (!) 144/77  Pulse: 95 86  Resp: 19 13  Temp:    SpO2: 100% 100%    Last Pain:  Vitals:   09/12/20 1432  TempSrc:   PainSc: 9                  Marleena Shubert K Marcin Holte

## 2020-09-12 NOTE — Interval H&P Note (Signed)
History and Physical Interval Note:  09/12/2020 12:36 PM  Alexandra [name]  has presented today for surgery, with the diagnosis of BK Amputation.  The various methods of treatment have been discussed with the patient and family. After consideration of risks, benefits and other options for treatment, the patient has consented to  Procedure(s): AMPUTATION BELOW KNEE (Right) as a surgical intervention.  The patient's history has been reviewed, patient examined, no change in status, stable for surgery.  I have reviewed the patient's chart and labs.  Questions were answered to the patient's satisfaction.     Festus Barren

## 2020-09-12 NOTE — Transfer of Care (Signed)
Immediate Anesthesia Transfer of Care Note  Patient: Alexandra [name]  Procedure(s) Performed: AMPUTATION BELOW KNEE (Right: Knee)  Patient Location: PACU  Anesthesia Type:General  Level of Consciousness: drowsy  Airway & Oxygen Therapy: Patient Spontanous Breathing and Patient connected to nasal cannula oxygen  Post-op Assessment: Report given to RN  Post vital signs: stable  Last Vitals:  Vitals Value Taken Time  BP 140/96 09/12/20 1349  Temp    Pulse 109 09/12/20 1351  Resp 14 09/12/20 1351  SpO2 98 % 09/12/20 1351  Vitals shown include unvalidated device data.  Last Pain:  Vitals:   09/12/20 1152  TempSrc: Oral  PainSc: 6       Patients Stated Pain Goal: 0 (09/12/20 1152)  Complications: No notable events documented.

## 2020-09-12 NOTE — Plan of Care (Signed)
Pt drowsy with decreased respirations on am assessment. Pt RR of 8-9 while asleep, RN aroused pt and found to have RR 11-12 while awake. Pt fell back asleep without stimulation. RN notified provider Mansy regarding pt status and concerns. Agreeable to plan of discontinuing pca at this time. Vicodin and dilaudid IV available for pt, if needed. Pt agrees to discontinuing pca pump. Narcan drawn, however pt alert, oriented and sitting up talking. RR 14 currently. Will hold off on narcan administration.

## 2020-09-12 NOTE — Plan of Care (Signed)
RN walked in to find pt crying in bed. Pt states pain has gotten out of control. RN asked pt if she had been utilizing the pca pump. Pt states she thinks it makes her foot hurt worse so she has not pressed the button. RN educated pt that pca is delivering morphine which is the same medication she received 6/29 night via IV. Pt indicated that she is afraid to be on all of these medications and does not want too much. PCA pump education provided to pt. RN educated pt that pca is set to deliver med only within certain timeframes and will only administer to the pt if she is able to have it. Pt verbalized understanding and began crying "I just know tomorrow I will not have a foot." RN and pt spoke about her feelings regarding losing her foot. RN let pt know to call if she can think of anything that will help. RN witnessed pt press pca button to receive a dose of morphine. Pt states she will try to utilize the button now and will call if she needs assistance. Will continue to monitor.

## 2020-09-12 NOTE — Progress Notes (Signed)
ID Pt apparently was very drowsy with low RR due to PCP pump this morning - They were about to give narcan when she woke up and became alert    Pt is in recovery- had rt BKA this morning In pain BP (!) 145/75   Pulse 90   Temp 99.3 F (37.4 C)   Resp (!) 9   Ht 5\' 3"  (1.6 m)   Wt 57 kg   LMP 01/14/2009   SpO2 100%   BMI 22.26 kg/m    Rt BKA- surgical dressing Hs S1s2 Abd soft Rt femoral site of multiple angio looks clean-   Labs CBC Latest Ref Rng & Units 09/12/2020 09/10/2020 09/09/2020  WBC 4.0 - 10.5 K/uL 24.6(H) 18.7(H) 13.8(H)  Hemoglobin 12.0 - 15.0 g/dL 10.1(L) 9.9(L) 9.2(L)  Hematocrit 36.0 - 46.0 % 30.9(L) 31.9(L) 28.5(L)  Platelets 150 - 400 K/uL 392 307 262    CMP Latest Ref Rng & Units 09/12/2020 09/10/2020 09/09/2020  Glucose 70 - 99 mg/dL 89 09/11/2020) 962(X)  BUN 6 - 20 mg/dL 14 9 7   Creatinine 0.44 - 1.00 mg/dL 528(U ) 1.32)  Sodium 135 - 145 mmol/L 139 137 137  Potassium 3.5 - 5.1 mmol/L 3.6 4.1 4.0  Chloride 98 - 111 mmol/L 99 104 105  CO2 22 - 32 mmol/L 30 26 26   Calcium 8.9 - 10.3 mg/dL 4.40(N) 0.27(O) )  Total Protein 6.5 - 8.1 g/dL - - -  Total Bilirubin 0.3 - 1.2 mg/dL - - -  Alkaline Phos 38 - 126 U/L - - -  AST 15 - 41 U/L - - -  ALT 0 - 44 U/L - - -    Impression/recommendation  Critical ischemia of the right foot with blistering and purple discoloration.  Underwent BKA this morning.  Leukocytosis.  Could be a reaction to ischemic foot. Because of the vasculitic ulcer which was colonized with MRSA we will give vancomycin.  We will continue for 24 hours to 48 hours.  Vasculitic ulcer right foot on the lateral aspect.  Better since started on prednisone.  Also got rituximab. Colonized with MRSA and also had stenotrophomonas.  Was on Bactrim for the last 7 days.  Today switched to vancomycin for 24 to 48 hours depending on the white count.  Small vessel vasculitis with p-ANCA positive, rheumatoid factor positive, low complement and lung  involvement.  Wegener's is being questioned.  Rheumatologist on the case.  Patient got 3 doses of high dose Solu-Medrol and then a dose of rituximab.  Aortic thrombus.  Thrombophilia work-up negative.  Discussed the management with the care team. ID will follow her peripherally this weekend.  Call if needed.[phone](G

## 2020-09-12 NOTE — Anesthesia Procedure Notes (Signed)
Procedure Name: Intubation Date/Time: 09/12/2020 12:42 PM Performed by: Jaye Beagle, CRNA Pre-anesthesia Checklist: Patient identified, Emergency Drugs available, Suction available and Patient being monitored Patient Re-evaluated:Patient Re-evaluated prior to induction Oxygen Delivery Method: Circle system utilized Preoxygenation: Pre-oxygenation with 100% oxygen Induction Type: IV induction Ventilation: Mask ventilation without difficulty Laryngoscope Size: McGraph and 3 Grade View: Grade I Tube type: Oral Tube size: 6.5 mm Number of attempts: 1 Airway Equipment and Method: Stylet and Oral airway Placement Confirmation: ETT inserted through vocal cords under direct vision, positive ETCO2 and breath sounds checked- equal and bilateral Tube secured with: Tape Dental Injury: Teeth and Oropharynx as per pre-operative assessment

## 2020-09-12 NOTE — Progress Notes (Signed)
[facility]  - [place] at [facility]   PATIENT NAME: Alexandra [name]    MR#:  [id number]  DATE OF BIRTH:  09/02/1970  SUBJECTIVE:  continues with right foot pain. Has significant cyanotic/discoloration. No family at bedside. Patient was started on PCM morphine pump. Had to be held given her decreased respirations in the nighttime. Scheduled for BKA today. Awake and vitals ok REVIEW OF SYSTEMS:   Review of Systems  Constitutional:  Negative for chills, fever and weight loss.  HENT:  Negative for ear discharge, ear pain and nosebleeds.   Eyes:  Negative for blurred vision, pain and discharge.  Respiratory:  Negative for sputum production, shortness of breath, wheezing and stridor.   Cardiovascular:  Negative for chest pain, palpitations, orthopnea and PND.  Gastrointestinal:  Negative for abdominal pain, diarrhea, nausea and vomiting.  Genitourinary:  Negative for frequency and urgency.  Musculoskeletal:  Negative for back pain and joint pain.       Right leg pain  Neurological:  Positive for weakness. Negative for sensory change, speech change and focal weakness.  Psychiatric/Behavioral:  Negative for depression and hallucinations. The patient is not nervous/anxious.   Tolerating Diet:npo Tolerating PT:   DRUG ALLERGIES:  No Known Allergies  VITALS:  Blood pressure 132/64, pulse 81, temperature 98.3 F (36.8 C), temperature source Oral, resp. rate 15, height 5\' 3"  (1.6 m), weight 57 kg, last menstrual period 01/14/2009, SpO2 98 %.  PHYSICAL EXAMINATION:   Physical Exam  GENERAL:  50 y.o.-year-old patient lying in the bed with no acute distress.  HEENT: Head atraumatic, normocephalic. Oropharynx and nasopharynx clear.  NECK:  Supple, no jugular venous distention. No thyroid enlargement, no tenderness.  LUNGS: Normal breath sounds bilaterally, no wheezing, rales, rhonchi. No use of accessory muscles of respiration.  CARDIOVASCULAR: S1, S2 normal. No murmurs,  rubs, or gallops.  ABDOMEN: Soft, nontender, nondistended. Bowel sounds present. No organomegaly or mass.  EXTREMITIES:  NEUROLOGIC: non focal   PSYCHIATRIC:  patient is alert and oriented x 3.  SKIN: as above   LABORATORY PANEL:  CBC Recent Labs  Lab 09/12/20 0601  WBC 24.6*  HGB 10.1*  HCT 30.9*  PLT 392     Chemistries  Recent Labs  Lab 09/12/20 0601  NA 139  K 3.6  CL 99  CO2 30  GLUCOSE 89  BUN 14  CREATININE 0.49  CALCIUM 8.7*  MG 2.2    Cardiac Enzymes No results for input(s): TROPONINI in the last 168 hours. RADIOLOGY:  No results found. ASSESSMENT AND PLAN:  Alexandra [name] is a 50 y.o. 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 Imaging shows early periosteal reaction which could be reactive periostitis versus early osteomyelitis.   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  Chest x-ray, CT chest with interstitial lung disease. -Concern for possible Buerger's disease with history of smoking. -Right lower extremity ultrasound negative for DVT. -Patient seen by vascular surgery, underwent angiogram without intervention with normal common femoral artery, profunda femoris artery was small but normal, superficial femoral artery and popliteal arteries normal.  Typical tibial trifurcation with three-vessel runoff although all 3 vessels were small with poor flow into the foot and small vessel disease in the foot and ankle. 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 Recieved x1 dose (6/24)--repeat on 09/19/20 --Hypercoagulable work-up/labs ordered per hematology --7/1--for right BKA. Started IV vanc per ID  Right foot ischemia/severe pain -- vascular surgery Dr. Wyn Quaker recommends right below knee amputation. Patient is in agreement now.--scheduled for tomorrow 09/12/20 --Continue Lipitor, heparin.  -- Continue current pain medication regimen-- requiring higher doses of narcotics. --6/30--start IV Morphine PCA pump--pt is agreement. D/w pharmacist --7/1--d/ced PCA pump due to low RR . 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 -Patient being followed by vascular surgery and patient underwent mechanical thrombectomy of the aorta and right common iliac artery with kissing balloon and stent placement to bilateral common iliac arteries per vascular surgery, Dr. Wyn Quaker 09/08/2020.   -Continue IV heparin.   -Status post thrombolytic therapy for ischemic foot    Depression -Stable. -Patient tearful with current hospitalization with ischemic foot and concerns for probable amputation to be done during this hospitalization per patient. -Continue Celexa.    Nicotine dependence -Cessation stressed to patient and patient very motivated to quit a lot of current hospitalization and ischemic foot.   -Nicotine patch.   Iron deficiency anemia -Continue oral iron supplementation.   -Hemoglobin currently at 9.2.   -Transfusion threshold hemoglobin < 7.  Thrombocytosis -Likely reactive. -Trending down.      DVT prophylaxis: Heparin Code Status: Full Family Communication: Updated patient.  No family at bedside. Disposition:    Status is: Inpatient    Remains inpatient appropriate because:IV treatments appropriate due to intensity of illness or inability to take PO   Dispo: The patient is from: Home              Anticipated d/c is to: Home              Patient currently is not medically stable to d/c.              Difficult to place patient No   For Right BKA today      TOTAL TIME TAKING CARE OF THIS PATIENT: 25 minutes.  >50% time spent on counselling and coordination of care  Note: This dictation was prepared with Dragon dictation along with smaller phrase technology. Any transcriptional errors that result from this process are unintentional.  [name] M.D    [facility]   CC: Primary care physician; Center, [facility] Patient ID: Alexandra [name], female   DOB: May 06, 1970, 50 y.o.   MRN: [id number]

## 2020-09-12 NOTE — Anesthesia Preprocedure Evaluation (Signed)
Anesthesia Evaluation  Patient identified by MRN, date of birth, ID band Patient awake    Reviewed: Allergy & Precautions, NPO status , Patient's Chart, lab work & pertinent test results  History of Anesthesia Complications Negative for: history of anesthetic complications  Airway Mallampati: III  TM Distance: <3 FB Neck ROM: full    Dental  (+) Chipped   Pulmonary neg shortness of breath, Current Smoker,    Pulmonary exam normal        Cardiovascular Exercise Tolerance: Good (-) angina+ Peripheral Vascular Disease  (-) Past MI and (-) DOE Normal cardiovascular exam     Neuro/Psych PSYCHIATRIC DISORDERS  Neuromuscular disease    GI/Hepatic negative GI ROS, Neg liver ROS, neg GERD  ,  Endo/Other  negative endocrine ROS  Renal/GU      Musculoskeletal   Abdominal   Peds  Hematology negative hematology ROS (+)   Anesthesia Other Findings ANCA  Past Medical History: 2013: Breast discharge     Comment:  present X 3 years  Left only multiple ducts per pt No date: Depression  Past Surgical History: No date: ABDOMINAL HYSTERECTOMY 2006: BREAST CYST EXCISION; Left 09/01/2020: LOWER EXTREMITY ANGIOGRAPHY; Right     Comment:  Procedure: Lower Extremity Angiography;  Surgeon: Dew,               Jason S, MD;  Location: [facility] INVASIVE CV LAB;  Service:               Cardiovascular;  Laterality: Right; 09/07/2020: LOWER EXTREMITY INTERVENTION; Right     Comment:  Procedure: LOWER EXTREMITY INTERVENTION;  Surgeon:               Brabham, Vance W, MD;  Location: [facility] INVASIVE CV LAB;                Service: Cardiovascular;  Laterality: Right;  BMI    Body Mass Index: 22.61 kg/m      Reproductive/Obstetrics negative OB ROS                             Anesthesia Physical Anesthesia Plan  ASA: 3  Anesthesia Plan: General ETT   Post-op Pain Management:    Induction: Intravenous  PONV Risk  Score and Plan: Ondansetron, Dexamethasone, Midazolam and Treatment may vary due to age or medical condition  Airway Management Planned: Oral ETT  Additional Equipment:   Intra-op Plan:   Post-operative Plan: Extubation in OR  Informed Consent: I have reviewed the patients History and Physical, chart, labs and discussed the procedure including the risks, benefits and alternatives for the proposed anesthesia with the patient or authorized representative who has indicated his/her understanding and acceptance.     Dental Advisory Given  Plan Discussed with: Anesthesiologist, CRNA and Surgeon  Anesthesia Plan Comments: (Patient consented for risks of anesthesia including but not limited to:  - adverse reactions to medications - damage to eyes, teeth, lips or other oral mucosa - nerve damage due to positioning  - sore throat or hoarseness - Damage to heart, brain, nerves, lungs, other parts of body or loss of life  Patient voiced understanding.)        Anesthesia Quick Evaluation  

## 2020-09-12 NOTE — Op Note (Signed)
OPERATIVE NOTE   PROCEDURE: Right below-the-knee amputation  PRE-OPERATIVE DIAGNOSIS: Right foot gangrene  POST-OPERATIVE DIAGNOSIS: same as above  SURGEON: Festus Barren, MD  ASSISTANT(S): Raul Del, PA-C  ANESTHESIA: general  ESTIMATED BLOOD LOSS: 100 cc  FINDING(S): none  SPECIMEN(S):  Right below-the-knee amputation  INDICATIONS:   Alexandra [name] is a 50 y.o. female who presents with right leg gangrene.  The patient is scheduled for a right below-the-knee amputation.  I discussed in depth with the patient the risks, benefits, and alternatives to this procedure.  The patient is aware that the risk of this operation included but are not limited to:  bleeding, infection, myocardial infarction, stroke, death, failure to heal amputation wound, and possible need for more proximal amputation.  The patient is aware of the risks and agrees proceed forward with the procedure. An assistant was present during the procedure to help facilitate the exposure and expedite the procedure.   DESCRIPTION:  After full informed written consent was obtained from the patient, the patient was brought back to the operating room, and placed supine upon the operating table.  Prior to induction, the patient received IV antibiotics.  The patient was then prepped and draped in the standard fashion for a below-the-knee amputation.  After obtaining adequate anesthesia, the patient was prepped and draped in the standard fashion for a right below-the-knee amputation. The assistant provided retraction and mobilization to help facilitate exposure and expedite the procedure throughout the entire procedure.  This included following suture, using retractors, and optimizing lighting.  I marked out the anterior incision two finger breadths below the tibial tuberosity and then the marked out a posterior flap that was one third of the circumference of the calf in length.   I made the incisions for these flaps, and then  dissected through the subcutaneous tissue, fascia, and muscle anteriorly.  I elevated  the periosteal tissue superiorly so that the tibia was about 3-4 cm shorter than the anterior skin flap.  I then transected the tibia with a power saw and then took a wedge off the tibia anteriorly with the power saw.  Then I smoothed out the rough edges.  In a similar fashion, I cut back the fibula about two centimeters higher than the level of the tibia with a bone cutter.  I put a bone hook into the distal tibia and then used a large amputation knife to sharply develop a tissue plane through the muscle along the fibula.  In such fashion, the posterior flap was developed.  At this point, the specimen was passed off the field as the below-the-knee amputation.  At this point, I clamped all visibly bleeding arteries and veins using a combination of suture ligation with Silk suture and electrocautery.  Bleeding continued to be controlled with electrocautery and suture ligature.  The stump was washed off with sterile normal saline and no further active bleeding was noted.  I reapproximated the anterior and posterior fascia  with interrupted stitches of 0 Vicryl.  This was completed along the entire length of anterior and posterior fascia until there were no more loose space in the fascial line. I then placed a layer of 2-0 Vicryl sutures in the subcutaneous tissue. The skin was then  reapproximated with staples.  The stump was washed off and dried.  The incision was dressed with Xeroform and  then fluffs were applied.  Kerlix was wrapped around the leg and then gently an ACE wrap was applied.    COMPLICATIONS: none  CONDITION: stable   Festus Barren  09/12/2020, 1:48 PM    This note was created with Dragon Medical transcription system. Any errors in dictation are purely unintentional.

## 2020-09-12 NOTE — Progress Notes (Signed)
RN checked in on pt and pt states that her pain is more tolerable. Pt endorses utilizing pca pump in the time since previous discussion with RN. Pt in bed on cell phone. Will continue to monitor.

## 2020-09-12 NOTE — Progress Notes (Addendum)
Pharmacy Antibiotic Note  Alexandra [name] is a 50 y.o. female admitted on 08/28/2020 with leg wound.  Pharmacy has been consulted for vancomycin dosing.  SHe has been diagnosed with vasculitis s/p high dose solumedrol IV x 3 doses, rituximab, and now prednisone 60mg  daily.  Wound culture grew MRSA. She has developed thrombophilia and worsening ichemia of RLE.  She underwent BKA 7/1.  She has been on TMP/SMZ x 10 days.  Previously on vanco/ceftriaxone.   Pharmacy asked to dose vancomycin prior to amputation and to continue post-op.    Plan: Vancomycin 1gm IV x 1 given preop @ 12:49, start vancomycin 1250mg  IV q24h  tomorrow  morning AUC 511, rounding SCr to 0.75 mg/dl and weight of 9/1 (adjusted from 57kg for BKA- limb loss) Goal AUC 400-600 Monitor renal function, levels if remains on vancomycin > 4 days Follow-up length of therapy.    Height: 5\' 3"  (160 cm) Weight: 57 kg (125 lb 10.6 oz) IBW/kg (Calculated) : 52.4  Temp (24hrs), Avg:98.6 F (37 C), Min:98.3 F (36.8 C), Max:99 F (37.2 C)  Recent Labs  Lab 09/07/20 0426 09/07/20 1347 09/08/20 0422 09/08/20 0759 09/09/20 0152 09/10/20 0406 09/12/20 0601  WBC 15.4*   < > 15.6* 14.4* 13.8* 18.7* 24.6*  CREATININE 0.64  --  0.43*  --  0.41* 0.33* 0.49   < > = values in this interval not displayed.    Estimated Creatinine Clearance: 68.8 mL/min (by C-G formula based on SCr of 0.49 mg/dL).    No Known Allergies  Antimicrobials this admission: --Vanc 6/17 >> 6/18, 7/1 >> --Ceftriaxone 6/17 >> 6/20 --Metronidazole6/17 >> 6/20 --Augmentin 6/20 >> 6/22 --Bactrim 6/22 >> 7/1  Dose adjustments this admission:   Microbiology results: 6/20 wound: MRSA + mixed organisms,  note micro initially reported stenotrophomonas in this culture  Thank you for allowing pharmacy to be a part of this patient's care.  7/22, PharmD, BCPS.   Work Cell: [phone] 09/12/2020 3:46 PM

## 2020-09-13 ENCOUNTER — Encounter: Payer: Self-pay | Admitting: Vascular Surgery

## 2020-09-13 DIAGNOSIS — Z89511 Acquired absence of right leg below knee: Secondary | ICD-10-CM

## 2020-09-13 LAB — CBC
HCT: 28.9 % — ABNORMAL LOW (ref 36.0–46.0)
Hemoglobin: 9.2 g/dL — ABNORMAL LOW (ref 12.0–15.0)
MCH: 23.7 pg — ABNORMAL LOW (ref 26.0–34.0)
MCHC: 31.8 g/dL (ref 30.0–36.0)
MCV: 74.5 fL — ABNORMAL LOW (ref 80.0–100.0)
Platelets: 379 10*3/uL (ref 150–400)
RBC: 3.88 MIL/uL (ref 3.87–5.11)
RDW: 19.5 % — ABNORMAL HIGH (ref 11.5–15.5)
WBC: 17.2 10*3/uL — ABNORMAL HIGH (ref 4.0–10.5)
nRBC: 0 % (ref 0.0–0.2)

## 2020-09-13 LAB — CREATININE, SERUM
Creatinine, Ser: 0.33 mg/dL — ABNORMAL LOW (ref 0.44–1.00)
GFR, Estimated: >60 mL/min (ref >60)

## 2020-09-13 MED ORDER — PREDNISONE 20 MG PO TABS
40.0000 mg | ORAL_TABLET | Freq: Every day | ORAL | Status: DC
  Administered 2020-09-14 – 2020-09-16 (×3): 40 mg via ORAL
  Filled 2020-09-13 (×3): qty 2

## 2020-09-13 NOTE — Progress Notes (Signed)
[facility]  - [place] at [facility]   PATIENT NAME: Alexandra [name]    MR#:  [id number]  DATE OF BIRTH:  07/18/1970  SUBJECTIVE:  POD #1 Right BKA Worked with PT Eating ok REVIEW OF SYSTEMS:   Review of Systems  Constitutional:  Negative for chills, fever and weight loss.  HENT:  Negative for ear discharge, ear pain and nosebleeds.   Eyes:  Negative for blurred vision, pain and discharge.  Respiratory:  Negative for sputum production, shortness of breath, wheezing and stridor.   Cardiovascular:  Negative for chest pain, palpitations, orthopnea and PND.  Gastrointestinal:  Negative for abdominal pain, diarrhea, nausea and vomiting.  Genitourinary:  Negative for frequency and urgency.  Musculoskeletal:  Negative for back pain and joint pain.  Neurological:  Positive for weakness. Negative for sensory change, speech change and focal weakness.  Psychiatric/Behavioral:  Negative for depression and hallucinations. The patient is not nervous/anxious.   Tolerating Diet:yes Tolerating PT: CIR  DRUG ALLERGIES:  No Known Allergies  VITALS:  Blood pressure 123/69, pulse 67, temperature 98.7 F (37.1 C), temperature source Oral, resp. rate 15, height 5\' 3"  (1.6 m), weight 57 kg, last menstrual period 01/14/2009, SpO2 100 %.  PHYSICAL EXAMINATION:   Physical Exam  GENERAL:  50 y.o.-year-old patient lying in the bed with no acute distress.  HEENT: Head atraumatic, normocephalic. Oropharynx and nasopharynx clear.  NECK:  Supple, no jugular venous distention. No thyroid enlargement, no tenderness.  LUNGS: Normal breath sounds bilaterally, no wheezing, rales, rhonchi. No use of accessory muscles of respiration.  CARDIOVASCULAR: S1, S2 normal. No murmurs, rubs, or gallops.  ABDOMEN: Soft, nontender, nondistended. Bowel sounds present. No organomegaly or mass.  EXTREMITIES: right BKA stump+ NEUROLOGIC: non focal   PSYCHIATRIC:  patient is alert and oriented x 3.  SKIN: as  above   LABORATORY PANEL:  CBC Recent Labs  Lab 09/13/20 0452  WBC 17.2*  HGB 9.2*  HCT 28.9*  PLT 379     Chemistries  Recent Labs  Lab 09/12/20 0601 09/13/20 0452  NA 139  --   K 3.6  --   CL 99  --   CO2 30  --   GLUCOSE 89  --   BUN 14  --   CREATININE 0.49 0.33*  CALCIUM 8.7*  --   MG 2.2  --     Cardiac Enzymes No results for input(s): TROPONINI in the last 168 hours. RADIOLOGY:  No results found. ASSESSMENT AND PLAN:  AIRA SALLADE is a 50 y.o. 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 Imaging shows early periosteal reaction which could be reactive periostitis versus early osteomyelitis.   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  Chest x-ray, CT chest with interstitial lung disease. -Concern for possible Buerger's disease with history of smoking. -Right lower extremity ultrasound negative for DVT. -Patient seen by vascular surgery, underwent angiogram without intervention with normal common femoral artery, profunda femoris artery was small but normal, superficial femoral artery and popliteal arteries normal.  Typical tibial trifurcation with three-vessel runoff although all 3 vessels were small with poor flow into the foot and small vessel disease in the foot and ankle. -Patient being followed by ID and cultures with abundant MRSA  within mixed organisms and stenotrophomonas--now on po bactrim for 7 days -Patient seen in consultation by rheumatology  Dr Gavin Potters who feels patient has pANCA positive vasculitis, inflammatory arthritis with a component of neuropathy and a suspicious lung disease and received high doses of IV  Solu-Medrol --now on po prednisone down to 40 mg -- hematology consultation Dr Orlie Dakin regarding initiation of rituximab. Recieved x1 dose (6/24)--repeat on 09/19/20 --Hypercoagulable work-up/labs ordered per hematology --7/1--for right BKA. Started IV vanc per ID --7/2--POD#1 Right BKA--doing ok. 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 -Patient being followed by vascular surgery and patient underwent mechanical thrombectomy of the aorta and right common iliac artery with kissing balloon and stent placement to bilateral common iliac arteries per vascular surgery, Dr. Wyn Quaker 09/08/2020.   -was  on IV heparin. --now on po eliquis -Status post thrombolytic therapy for ischemic foot    Depression -Continue Celexa.    Nicotine dependence --Nicotine patch.   Iron deficiency anemia -Continue oral iron supplementation.   -Hemoglobin currently at 9.2.   -Transfusion threshold hemoglobin < 7.  Thrombocytosis -Likely reactive. -Trending down.      DVT prophylaxis: eleiquis Code Status: Full Family Communication: Updated patient.  No family at bedside. Disposition:    Status is: Inpatient   Remains inpatient appropriate because:IV treatments appropriate due to intensity of illness or inability to take PO   Dispo: The patient is from: Home              Anticipated d/c is to: Home              Patient currently is medically stable to d/c. TOC for d/c to CIR/?Rehab              Difficult to place patient No        TOTAL TIME TAKING CARE OF THIS PATIENT: 25 minutes.  >50% time spent on counselling and coordination of care  Note: This dictation was prepared with Dragon dictation along with smaller phrase technology. Any transcriptional errors that result from this process are unintentional.  [name] M.D    [facility]   CC: Primary care physician; Center, [facility]  [facility] Patient ID: Alexandra [name], female   DOB: 1970/07/05, 50 y.o.   MRN: [id number]

## 2020-09-13 NOTE — Evaluation (Signed)
Physical Therapy Evaluation Patient Details Name: Alexandra [name] MRN: [id number] DOB: 28-Sep-1970 Today's Date: 09/13/2020   History of Present Illness  ONIKA GUDIEL is a 50 y.o. 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    Clinical Impression  Pt admitted with above diagnosis. Pt received seated in bed agreeable to eval. RN present to assist in linens changed on bed due to being wet from urine. Pt displays good bed mobility with supervision from PT and HoB elevated to go from supine to seated EOB. RN placed pt on RA and pt O2 stas remained > 95% throughout entirety of PT session. Pt supervision for STS to RW. Pt ambulated with hop-to pattern 20' in room limited due to reports of nausea needing to vomit so pt returned to recliner requiring VC's to decrease speed to maximize safety. Pt did require frequent standing rests during walking bout due to fatigue. Pt educated on appropriate positioning of RLE and importance to prevent hip/knee contractures for future mobility. Pt given HEP handout and performed all seated exercises and educated on supine/prone exercises. Pt displays generalized weakness/fatigue, limited R knee AROM, PRN assistance from daughter due to part time working (otherwise home with pt and can help), and requires AD for OOB mobility thus pt can benefit from d/c recs below to maximize independence and functional mobility. Pt currently with functional limitations due to the deficits listed below (see PT Problem List). Pt will benefit from skilled PT to increase their independence and safety with mobility to allow discharge to the venue listed below.       Follow Up Recommendations CIR    Equipment  Recommendations  Rolling walker with 5" wheels;3in1 (PT)    Recommendations for Other Services       Precautions / Restrictions Precautions Precautions: Fall Restrictions Weight Bearing Restrictions: Yes RLE Weight Bearing: Non weight bearing      Mobility  Bed Mobility Overal bed mobility: Needs Assistance Bed Mobility: Supine to Sit     Supine to sit: Supervision;HOB elevated          Transfers Overall transfer level: Needs assistance Equipment used: Rolling walker (2 wheeled) Transfers: Sit to/from [employer] Transfers Sit to Stand: Supervision;From elevated surface Stand pivot transfers: From elevated surface;Supervision          Ambulation/Gait Ambulation/Gait assistance: Min guard Gait Distance (Feet): 16 Feet Assistive device: Rolling walker (2 wheeled)       General Gait Details: Hop-to pattern 2/2 R BKA. Good UE strength noted.  Stairs            Wheelchair Mobility    Modified Rankin (Stroke Patients Only)       Balance Overall balance assessment: Needs assistance Sitting-balance support: Feet supported Sitting balance-Leahy Scale: Good       Standing balance-Leahy Scale: Fair                               Pertinent Vitals/Pain Pain Assessment: Faces Faces Pain Scale: Hurts a little bit Pain Location: R surgical site Pain Descriptors / Indicators: Burning Pain Intervention(s): Limited activity within patient's tolerance;Monitored during session;Repositioned    Home Living Family/patient expects to be discharged to:: Private residence Living Arrangements:  Alone Available Help at Discharge: Family;Available PRN/intermittently (daughter is only help. She is 78 and works part time.) Type of Home: House Home Access: Stairs to enter Entrance Stairs-Rails: Right Entrance Stairs-Number of Steps: 3 Home Layout: One level;Able to live on main level with bedroom/bathroom Home Equipment: Dan Humphreys - 2  wheels;Wheelchair - manual;Crutches      Prior Function Level of Independence: Independent               Hand Dominance        Extremity/Trunk Assessment   Upper Extremity Assessment Upper Extremity Assessment: Defer to OT evaluation;Overall [facility] for tasks assessed;Generalized weakness    Lower Extremity Assessment Lower Extremity Assessment: Overall WFL for tasks assessed;Generalized weakness;RLE deficits/detail RLE Deficits / Details: R BKA    Cervical / Trunk Assessment Cervical / Trunk Assessment: Normal  Communication   Communication: No difficulties  Cognition Arousal/Alertness: Awake/alert Behavior During Therapy: WFL for tasks assessed/performed Overall Cognitive Status: Within Functional Limits for tasks assessed                                 General Comments: Flat affect present.      General Comments General comments (skin integrity, edema, etc.): R BKA wrapped    Exercises General Exercises - Lower Extremity Quad Sets: AROM;Strengthening;Right;Seated;10 reps Gluteal Sets: AROM;Right;Seated;10 reps Long Arc Quad: AROM;Seated;Right;10 reps Hip ABduction/ADduction: AROM;Seated;10 reps;Both Straight Leg Raises: AROM;Seated;Right;10 reps   Assessment/Plan    PT Assessment Patient needs continued PT services  PT Problem List Decreased strength;Decreased mobility;Decreased safety awareness;Decreased range of motion;Decreased activity tolerance;Decreased balance;Pain       PT Treatment Interventions DME instruction;Therapeutic exercise;Gait training;Balance training;Stair training;Neuromuscular re-education;Functional mobility training;Patient/family education;Therapeutic activities    PT Goals (Current goals can be found in the Care Plan section)  Acute Rehab PT Goals Patient Stated Goal: get better PT Goal Formulation: With patient Time For Goal Achievement: 09/27/20 Potential to Achieve Goals: Good    Frequency 7X/week    Barriers to discharge   Daughter available but works part time if pt required a lot of assistance    Co-evaluation               AM-PAC PT "6 Clicks" Mobility  Outcome Measure Help needed turning from your back to your side while in a flat bed without using bedrails?: A Little Help needed moving from lying on your back to sitting on the side of a flat bed without using bedrails?: A Little Help needed moving to and from a bed to a chair (including a wheelchair)?: A Little Help needed standing up from a chair using your arms (e.g., wheelchair or bedside chair)?: A Little Help needed to walk in hospital room?: A Lot Help needed climbing 3-5 steps with a railing? : A Lot 6 Click Score: 16    End of Session Equipment Utilized During Treatment: Gait belt Activity Tolerance: Patient tolerated treatment well;No increased pain;Other (comment) (limited by nausea) Patient left: in chair;with call bell/phone within reach;with chair alarm set (R BKA in extension) Nurse Communication: Mobility status PT Visit Diagnosis: Unsteadiness on feet (R26.81);Other abnormalities of gait and mobility (R26.89);Muscle weakness (generalized) (M62.81);Difficulty in walking, not elsewhere classified (R26.2);Pain Pain - Right/Left: Right Pain - part of body: Leg    Time: 1120-1201 PT Time Calculation (min) (ACUTE ONLY): 41 min   Charges:   PT Evaluation $PT Eval Low Complexity: 1 Low PT Treatments $Gait Training: 8-22 mins $Therapeutic Exercise:  8-22 mins        Delphia Grates. Fairly IV, PT, DPT Physical Therapist- [place]  [facility]  09/13/2020, 1:14 PM

## 2020-09-13 NOTE — TOC Progression Note (Signed)
Transition of Care [facility]) - Progression Note    Patient Details  Name: Alexandra [name] MRN: [id number] Date of Birth: 26-Nov-1970  Transition of Care [facility]) CM/SW Contact  Liliana Cline, LCSW Phone Number: 09/13/2020, 2:38 PM  Clinical Narrative:   CIR recommended. Reached out to Intake Coordinator Lauren requesting review of patient.          Expected Discharge Plan and Services                                                 Social Determinants of Health (SDOH) Interventions    Readmission Risk Interventions No flowsheet data found.

## 2020-09-14 LAB — CREATININE, SERUM
Creatinine, Ser: 0.36 mg/dL — ABNORMAL LOW (ref 0.44–1.00)
GFR, Estimated: >60 mL/min (ref >60)

## 2020-09-14 NOTE — Plan of Care (Signed)
  Problem: Pain Managment: Goal: General experience of comfort will improve Outcome: Progressing   Problem: Clinical Measurements: Goal: Ability to maintain clinical measurements within normal limits will improve Outcome: Progressing Goal: Will remain free from infection Outcome: Progressing Goal: Diagnostic test results will improve Outcome: Progressing Goal: Respiratory complications will improve Outcome: Progressing Goal: Cardiovascular complication will be avoided Outcome: Progressing   Pt is involved in and agrees with the plan of care. V/S stable. Complained of surgical pain on right knee (rates pain at 7/10); Norco and Dilaudid IV given.

## 2020-09-14 NOTE — Progress Notes (Signed)
Inpatient Rehab Admissions Coordinator Note:   Per PT recommendation, pt was screened for CIR candidacy by Wolfgang Phoenix, MS, CCC-SLP.  At this time we are not recommending an inpatient rehab consult. Would appreciate an OT evaluation.  Will continue to follow for therapy tolerance and progress.  Please contact me with questions.    Wolfgang Phoenix, MS, CCC-SLP Admissions Coordinator [phone] 09/14/20 5:16 PM

## 2020-09-14 NOTE — Progress Notes (Signed)
Physical Therapy Treatment Patient Details Name: Alexandra [name] MRN: [id number] DOB: 1970-03-18 Today's Date: 09/14/2020    History of Present Illness Alexandra [name] is a 49 y.o. 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    PT Comments    Pt somber this session.  While doing quad sets pt began crying/sobbing.  Emotional support given.  Answered questions and discussed expectations for recovery and what to expect as she stated her education on matter had been limited.  She did seem more at ease upon leaving session.  She did opt to rest today as she was still a bit labile at end of session.  Will continue as appropriate.   Follow Up Recommendations  CIR     Equipment Recommendations  Rolling walker with 5" wheels;3in1 (PT)    Recommendations for Other Services       Precautions / Restrictions Precautions Precautions: Fall Restrictions Weight Bearing Restrictions: Yes RLE Weight Bearing: Non weight bearing    Mobility  Bed Mobility                    Transfers                    Ambulation/Gait                 Stairs             Wheelchair Mobility    Modified Rankin (Stroke Patients Only)       Balance                                            Cognition Arousal/Alertness: Awake/alert Behavior During Therapy: WFL for tasks assessed/performed Overall Cognitive Status: Within Functional Limits for tasks assessed                                 General Comments: crying during session      Exercises      General Comments        Pertinent Vitals/Pain Pain Assessment: Faces Faces Pain Scale: Hurts even  more Pain Location: R surgical site Pain Descriptors / Indicators: Burning;Sore Pain Intervention(s): Limited activity within patient's tolerance;Monitored during session;Repositioned    Home Living                      Prior Function            PT Goals (current goals can now be found in the care plan section) Progress towards PT goals: Progressing toward goals    Frequency    7X/week      PT Plan Current plan remains appropriate    Co-evaluation              AM-PAC PT "6 Clicks" Mobility   Outcome Measure  Help needed turning from your back to your side while in a flat bed without using bedrails?: A Little Help needed moving from lying on your back to sitting on the side of a flat bed without using bedrails?: A Little Help needed moving  to and from a bed to a chair (including a wheelchair)?: A Little Help needed standing up from a chair using your arms (e.g., wheelchair or bedside chair)?: A Little Help needed to walk in hospital room?: A Lot Help needed climbing 3-5 steps with a railing? : A Lot 6 Click Score: 16    End of Session   Activity Tolerance: Patient tolerated treatment well Patient left: in bed;with call bell/phone within reach;with bed alarm set Nurse Communication: Mobility status PT Visit Diagnosis: Unsteadiness on feet (R26.81);Other abnormalities of gait and mobility (R26.89);Muscle weakness (generalized) (M62.81);Difficulty in walking, not elsewhere classified (R26.2);Pain Pain - Right/Left: Right Pain - part of body: Leg     Time: 3785-8850 PT Time Calculation (min) (ACUTE ONLY): 24 min  Charges:  $Therapeutic Activity: 23-37 mins                    Danielle Dess, PTA 09/14/20, 2:42 PM , 2:39 PM

## 2020-09-14 NOTE — Progress Notes (Signed)
[facility]  - [place] [facility]   PATIENT NAME: Alexandra [name]    MR#:  [id number]  DATE OF BIRTH:  12/01/1970  SUBJECTIVE:  POD # 2 Right BKA Worked with PT Eating ok REVIEW OF SYSTEMS:   Review of Systems  Constitutional:  Negative for chills, fever and weight loss.  HENT:  Negative for ear discharge, ear pain and nosebleeds.   Eyes:  Negative for blurred vision, pain and discharge.  Respiratory:  Negative for sputum production, shortness of breath, wheezing and stridor.   Cardiovascular:  Negative for chest pain, palpitations, orthopnea and PND.  Gastrointestinal:  Negative for abdominal pain, diarrhea, nausea and vomiting.  Genitourinary:  Negative for frequency and urgency.  Musculoskeletal:  Negative for back pain and joint pain.  Neurological:  Positive for weakness. Negative for sensory change, speech change and focal weakness.  Psychiatric/Behavioral:  Negative for depression and hallucinations. The patient is not nervous/anxious.   Tolerating Diet:yes Tolerating PT: CIR  DRUG ALLERGIES:  No Known Allergies  VITALS:  Blood pressure 125/64, pulse 82, temperature 98 F (36.7 C), temperature source Oral, resp. rate 18, height 5\' 3"  (1.6 m), weight 57 kg, last menstrual period 01/14/2009, SpO2 95 %.  PHYSICAL EXAMINATION:   Physical Exam  GENERAL:  50 y.o.-year-old patient lying in the bed with no acute distress.  HEENT: Head atraumatic, normocephalic. Oropharynx and nasopharynx clear.  NECK:  Supple, no jugular venous distention. No thyroid enlargement, no tenderness.  LUNGS: Normal breath sounds bilaterally, no wheezing, rales, rhonchi. No use of accessory muscles of respiration.  CARDIOVASCULAR: S1, S2 normal. No murmurs, rubs, or gallops.  ABDOMEN: Soft, nontender, nondistended. Bowel sounds present. No organomegaly or mass.  EXTREMITIES: right BKA stump+ NEUROLOGIC: non focal   PSYCHIATRIC:  patient is alert and oriented x 3.  SKIN: as  above   LABORATORY PANEL:  CBC Recent Labs  Lab 09/13/20 0452  WBC 17.2*  HGB 9.2*  HCT 28.9*  PLT 379     Chemistries  Recent Labs  Lab 09/12/20 0601 09/13/20 0452 09/14/20 0443  NA 139  --   --   K 3.6  --   --   CL 99  --   --   CO2 30  --   --   GLUCOSE 89  --   --   BUN 14  --   --   CREATININE 0.49   < > 0.36*  CALCIUM 8.7*  --   --   MG 2.2  --   --    < > = values in this interval not displayed.    Cardiac Enzymes No results for input(s): TROPONINI in the last 168 hours. RADIOLOGY:  No results found. ASSESSMENT AND PLAN:  Alexandra [name] is a 50 y.o. 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 Imaging shows early periosteal reaction which could be reactive periostitis versus early osteomyelitis.  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  Chest x-ray, CT chest with interstitial lung disease. -Concern for possible Buerger's disease with history of smoking. -Right lower extremity ultrasound negative for DVT. -Patient seen by vascular surgery, underwent angiogram without intervention with normal common femoral artery, profunda femoris artery was small but normal, superficial femoral artery and  popliteal arteries normal.  Typical tibial trifurcation with three-vessel runoff although all 3 vessels were small with poor flow into the foot and small vessel disease in the foot and ankle. 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 Recieved x1 dose (6/24)--repeat on 09/19/20 --Hypercoagulable work-up/labs ordered per hematology --7/1--for right BKA. Started IV vanc per ID --7/2--POD#1 Right BKA--doing ok. Worked with PT --7/3--POD#2 no new issues. 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 -Patient being followed by vascular surgery and patient underwent mechanical thrombectomy of the aorta and right common iliac artery with kissing balloon and stent placement to bilateral common iliac arteries per vascular surgery, Dr. Wyn [name] 09/08/2020.   -was  on IV heparin. --now on po eliquis -Status post thrombolytic therapy for ischemic foot    Depression -Continue Celexa.    Nicotine dependence --Nicotine patch.   Iron deficiency anemia -Continue oral iron supplementation.   -Hemoglobin currently at 9.2.   -Transfusion threshold hemoglobin < 7.  Thrombocytosis -Likely reactive. -Trending down.      DVT prophylaxis: eleiquis Code Status: Full Family Communication: Updated patient.  No family at bedside. Disposition:    Status is: Inpatient    Dispo: The patient is from: Home              Anticipated d/c is to: Home              Patient currently is medically stable to d/c. TOC for d/c to CIR/?Rehab              Difficult to place patient No        TOTAL TIME TAKING CARE OF THIS PATIENT: 25 minutes.  >50% time spent on counselling and coordination of care  Note: This dictation was prepared with Dragon dictation along with smaller phrase technology. Any transcriptional errors that result from this process are unintentional.  Alexandra [name] M.D    [facility]    CC: Primary care physician; Center, [facility] Patient ID: Alexandra [name], female   DOB: 08/14/1970, 50 y.o.   MRN: [id number]

## 2020-09-14 NOTE — Plan of Care (Signed)

## 2020-09-15 LAB — TYPE AND SCREEN
ABO/RH(D): A POS
Antibody Screen: NEGATIVE
Unit division: 0
Unit division: 0

## 2020-09-15 LAB — CBC
HCT: 30.9 % — ABNORMAL LOW (ref 36.0–46.0)
Hemoglobin: 9.6 g/dL — ABNORMAL LOW (ref 12.0–15.0)
MCH: 24 pg — ABNORMAL LOW (ref 26.0–34.0)
MCHC: 31.1 g/dL (ref 30.0–36.0)
MCV: 77.3 fL — ABNORMAL LOW (ref 80.0–100.0)
Platelets: 451 10*3/uL — ABNORMAL HIGH (ref 150–400)
RBC: 4 MIL/uL (ref 3.87–5.11)
RDW: 19.8 % — ABNORMAL HIGH (ref 11.5–15.5)
WBC: 19.8 10*3/uL — ABNORMAL HIGH (ref 4.0–10.5)
nRBC: 0 % (ref 0.0–0.2)

## 2020-09-15 LAB — BPAM RBC
Blood Product Expiration Date: 202207172359
Blood Product Expiration Date: 202208012359
Unit Type and Rh: 600
Unit Type and Rh: 6200

## 2020-09-15 LAB — CREATININE, SERUM
Creatinine, Ser: 0.34 mg/dL — ABNORMAL LOW (ref 0.44–1.00)
GFR, Estimated: >60 mL/min (ref >60)

## 2020-09-15 LAB — PREPARE RBC (CROSSMATCH)

## 2020-09-15 NOTE — Progress Notes (Signed)
Physical Therapy Treatment Patient Details Name: Alexandra [name] MRN: [id number] DOB: 10-24-1970 Today's Date: 09/15/2020    History of Present Illness BALERIA WYMAN is a 50 y.o. 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    PT Comments    Pt was pleasant and motivated to participate during the session. Pt education on residual limb positioning provided and patient verbalized understanding. Pt performed ambulating and transfers with verbal cueing for sequencing and no physical assist. Pt attempted stairs but felt unsafe secondary to difficulty hopping the height of the steps so further training will be deferred to a later visit if appropriate. Pt reports that they will have a ramp installed soon so she hopefully doesn't have to use stairs at home. Pt will benefit from PT services in a SNF setting upon discharge to safely address deficits listed in patient problem list for decreased caregiver assistance and eventual return to PLOF.     Follow Up Recommendations  SNF     Equipment Recommendations  Rolling walker with 5" wheels;3in1 (PT)    Recommendations for Other Services       Precautions / Restrictions Precautions Precautions: Fall Restrictions Weight Bearing Restrictions: Yes RLE Weight Bearing: Non weight bearing Other Position/Activity Restrictions: s/p R BKA    Mobility  Bed Mobility Overal bed mobility: Needs Assistance Bed Mobility: Supine to Sit     Supine to sit: Supervision        Transfers Overall transfer level: Needs assistance Equipment used: Rolling walker (2 wheeled) Transfers: Sit to/from Stand Sit to Stand: Supervision;From elevated surface         General transfer  comment: Pt pushed through BUEs on RW without verbal cueing.  Ambulation/Gait Ambulation/Gait assistance: Min guard Gait Distance (Feet): 20 Feet Assistive device: Rolling walker (2 wheeled)   Gait velocity: decreased   General Gait Details: Hop-to pattern 2/2 R BKA. Good UE strength noted. Pt is very steady using RW with no LOB. Verbal cues for walker placement.   Stairs Stairs: Yes   Stair Management: One rail Right;With crutches (crutch on left side)   General stair comments: Stair sequence training provided verbally and via demonstration. Pt performed practice hops in front of stairs but unable to hop the height of step.  Pt felt unsafe hopping the height of the steps so stair training was deferred to a later visit if appropriate.    Wheelchair Mobility    Modified Rankin (Stroke Patients Only)       Balance Overall balance assessment: Needs assistance Sitting-balance support: Feet supported Sitting balance-Leahy Scale: Good     Standing balance support: Bilateral upper extremity supported Standing balance-Leahy Scale: Fair                              Cognition Arousal/Alertness: Awake/alert Behavior During Therapy: WFL for tasks assessed/performed Overall Cognitive Status: Within Functional Limits for tasks assessed                                        Exercises Total Joint Exercises Quad Sets: AROM;Right;10 reps;Other (comment);Supine Other Exercises Other Exercises: Static standing 2-34min  for improved activity tolerance. Other Exercises: Static sitting 3-45min for improved trunk control and balance Other Exercises: Local amputee support group information provided verbally. Other Exercises: Residual limb positioning education provided verbally. Other Exercises: HEP per handout    General Comments        Pertinent Vitals/Pain Pain Assessment: No/denies pain Pain Score: 7  Pain Location: R surgical site Pain Descriptors /  Indicators: Burning;Sore Pain Intervention(s): Premedicated before session    Home Living Family/patient expects to be discharged to:: Private residence Living Arrangements: Alone Available Help at Discharge: Family;Available PRN/intermittently (daughter is only help. She is 69 and works part time.) Type of Home: House Home Access: Stairs to enter Entrance Stairs-Rails: Right Home Layout: One level;Able to live on main level with bedroom/bathroom Home Equipment: Dan Humphreys - 2 wheels;Wheelchair - manual;Crutches;Hand held shower head;Adaptive equipment      Prior Function Level of Independence: Independent          PT Goals (current goals can now be found in the care plan section) Acute Rehab PT Goals Patient Stated Goal: get better Progress towards PT goals: Progressing toward goals    Frequency    7X/week      PT Plan Discharge plan needs to be updated    Co-evaluation              AM-PAC PT "6 Clicks" Mobility   Outcome Measure  Help needed turning from your back to your side while in a flat bed without using bedrails?: A Little Help needed moving from lying on your back to sitting on the side of a flat bed without using bedrails?: A Little Help needed moving to and from a bed to a chair (including a wheelchair)?: A Little Help needed standing up from a chair using your arms (e.g., wheelchair or bedside chair)?: A Little Help needed to walk in hospital room?: A Little Help needed climbing 3-5 steps with a railing? : A Lot 6 Click Score: 17    End of Session Equipment Utilized During Treatment: Gait belt Activity Tolerance: Patient tolerated treatment well Patient left: in chair;with call bell/phone within reach;with chair alarm set;with family/visitor present Nurse Communication: Mobility status PT Visit Diagnosis: Unsteadiness on feet (R26.81);Other abnormalities of gait and mobility (R26.89);Muscle weakness (generalized) (M62.81);Difficulty in walking, not  elsewhere classified (R26.2);Pain Pain - Right/Left: Right Pain - part of body: Leg     Time: 8469-6295 PT Time Calculation (min) (ACUTE ONLY): 49 min  Charges:                        Desiree Hane SPT 09/15/20, 3:40 PM

## 2020-09-15 NOTE — Evaluation (Signed)
Occupational Therapy Evaluation Patient Details Name: Alexandra [name] MRN: [id number] DOB: 1971-01-15 Today's Date: 09/15/2020    History of Present Illness Alexandra [name] is a 50 y.o. 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   Clinical Impression   Pt seen for OT evaluation this date, s/p R BKA. Pt was independent in all ADL prior to surgery. Pt is eager to return to PLOF with less pain and improved safety and independence. Pt currently requires supervision for bed mobility, PRN MIN A for LB ADL. Pain limited. Pt educated in residual limb positioning, knee extension exercises to promote contracture prevention, and desensitization strategies and phantom pain mgt strategies, and AE/DME for bathroom set up/safety with bathing and toileting; pt verbalized understanding and demo's proper technique for desensitization. Pt would benefit from skilled OT services including additional instruction in dressing techniques with or without assistive devices for dressing and bathing skills to support recall and carryover prior to discharge and ultimately to maximize safety, independence, and minimize falls risk and caregiver burden. Recommending HHOT, 3:1, and TTB to support maximal return to baseline function.     Follow Up Recommendations  Home health OT    Equipment Recommendations  Tub/shower bench;3 in 1 bedside commode    Recommendations for Other Services       Precautions / Restrictions Precautions Precautions: Fall Restrictions Weight Bearing Restrictions: Yes RLE Weight Bearing: Non weight bearing Other Position/Activity Restrictions: s/p R BKA      Mobility Bed Mobility               General bed  mobility comments: Pt able to perform repositioning without assist, declined to sit EOB 2/2 pain    Transfers                 General transfer comment: declined 2/2 pain    Balance                                           ADL either performed or assessed with clinical judgement   ADL Overall ADL's : Needs assistance/impaired                                       General ADL Comments: PRN MIN A for LB ADL, PRN set up if in standing for grooming     Vision Patient Visual Report: No change from baseline       Perception     Praxis      Pertinent Vitals/Pain Pain Assessment: 0-10 Pain Score: 7  Pain Location: R surgical site Pain Descriptors / Indicators: Burning;Sore Pain Intervention(s): Limited activity within patient's tolerance;Monitored during session;Premedicated before session     Hand Dominance     Extremity/Trunk Assessment Upper Extremity Assessment Upper Extremity Assessment: Overall WFL for tasks assessed   Lower Extremity Assessment Lower Extremity Assessment: RLE deficits/detail RLE Deficits / Details: R BKA RLE: Unable to fully assess due to pain   Cervical / Trunk Assessment Cervical / Trunk Assessment: Normal   Communication Communication Communication: No difficulties   Cognition Arousal/Alertness:  Awake/alert Behavior During Therapy: WFL for tasks assessed/performed Overall Cognitive Status: Within Functional Limits for tasks assessed                                     General Comments       Exercises Other Exercises Other Exercises: Pt educated in residual limb positioning, knee extension exercises to promote contracture prevention, and desensitization strategies and phantom pain mgt strategies, pt verbalized understanding and demo's proper technique for desensitization   Shoulder Instructions      Home Living Family/patient expects to be discharged to:: Private  residence Living Arrangements: Alone Available Help at Discharge: Family;Available PRN/intermittently (daughter is only help. She is 43 and works part time.) Type of Home: House Home Access: Stairs to enter Secretary/administrator of Steps: 3 Entrance Stairs-Rails: Right Home Layout: One level;Able to live on main level with bedroom/bathroom     Bathroom Shower/Tub: Tub/shower unit;Curtain   Bathroom Toilet: Standard Bathroom Accessibility: Yes (reports her bathroom is very small.)   Home Equipment: Walker - 2 wheels;Wheelchair - manual;Crutches;Hand held shower head;Adaptive equipment Adaptive Equipment: Reacher        Prior Functioning/Environment Level of Independence: Independent                 OT Problem List: Impaired balance (sitting and/or standing);Decreased knowledge of use of DME or AE;Decreased strength;Pain      OT Treatment/Interventions: Self-care/ADL training;Therapeutic exercise;Therapeutic activities;DME and/or AE instruction;Patient/family education;Balance training    OT Goals(Current goals can be found in the care plan section) Acute Rehab OT Goals Patient Stated Goal: get better OT Goal Formulation: With patient Time For Goal Achievement: 09/29/20 Potential to Achieve Goals: Good  OT Frequency: Min 2X/week   Barriers to D/C:            Co-evaluation              AM-PAC OT "6 Clicks" Daily Activity     Outcome Measure Help from another person eating meals?: None Help from another person taking care of personal grooming?: None Help from another person toileting, which includes using toliet, bedpan, or urinal?: A Little Help from another person bathing (including washing, rinsing, drying)?: A Little Help from another person to put on and taking off regular upper body clothing?: None Help from another person to put on and taking off regular lower body clothing?: None 6 Click Score: 22   End of Session    Activity Tolerance: Patient  tolerated treatment well;No increased pain Patient left: in bed;with call bell/phone within reach;with bed alarm set  OT Visit Diagnosis: Other abnormalities of gait and mobility (R26.89);Pain Pain - Right/Left: Right Pain - part of body: Leg                Time: 8937-3428 OT Time Calculation (min): 30 min Charges:  OT General Charges $OT Visit: 1 Visit OT Evaluation $OT Eval Moderate Complexity: 1 Mod OT Treatments $Self Care/Home Management : 23-37 mins  Wynona Canes, MPH, MS, OTR/L ascom [phone] 09/15/20, 12:38 PM

## 2020-09-15 NOTE — TOC Progression Note (Addendum)
Transition of Care [facility]) - Progression Note    Patient Details  Name: Alexandra [name] MRN: [id number] Date of Birth: Jul 29, 1970  [facility]) CM/SW Contact  Liliana Cline, LCSW Phone Number: 09/15/2020, 10:07 AM  Clinical Narrative:   CIR is not taking patient at this time. Spoke to patient via phone due to Contact Isolation. Patient will be discharging to [address] [place]- CSW corrected address in chart. Patient reported she has a RW, is interested in a 3 in 1. Charity 3 in 1 referral made to [facility]. Patient also interested in charity HHPT, referral made to [place] with [facility]. Informed [place] of plan for DC on 7/5. Plan for patient to use [facility] at discharge.   12:05- OT recommends tub transfer bench along with 3 in 1. Called [facility], they will deliver both charity 3 in 1 and charity tub transfer bench to bedside prior to discharge. Asked for orders.        Expected Discharge Plan and Services                                                 Social Determinants of Health (SDOH) Interventions    Readmission Risk Interventions No flowsheet data found.

## 2020-09-15 NOTE — Progress Notes (Signed)
ID Pt sitting in recliner Says she is feeling better Her rt stump dressing was changed today and was painful but as per the patient it looked very good  Patient Vitals for the past 24 hrs:  BP Temp Temp src Pulse Resp SpO2  09/15/20 1141 104/62 (!) 97.5 F (36.4 C) Oral 88 17 97 %  09/15/20 0806 118/70 97.8 F (36.6 C) -- 80 17 97 %  09/15/20 0450 122/75 98.3 F (36.8 C) Oral 87 16 97 %  09/14/20 2120 132/73 98.7 F (37.1 C) Oral 97 16 97 %  09/14/20 1608 120/69 98.1 F (36.7 C) Oral 100 18 96 %    Chest b/l air entry [id number] Abd soft Rt BKA covered with dressing B/l femoral site at previous agio site- looks clean- rt femoral site has sealant- no erythema or swelling CNS non focal  Labs CBC Latest Ref Rng & Units 09/15/2020 09/13/2020 09/12/2020  WBC 4.0 - 10.5 K/uL 19.8(H) 17.2(H) 24.6(H)  Hemoglobin 12.0 - 15.0 g/dL 8.8(K) 8.0(K) 10.1(L)  Hematocrit 36.0 - 46.0 % 30.9(L) 28.9(L) 30.9(L)  Platelets 150 - 400 K/uL 451(H) 379 392    CMP Latest Ref Rng & Units 09/15/2020 09/14/2020 09/13/2020  Glucose 70 - 99 mg/dL - - -  BUN 6 - 20 mg/dL - - -  Creatinine 3.49 - 1.00 mg/dL 1.79(X) 5.05(W) 9.79(Y)  Sodium 135 - 145 mmol/L - - -  Potassium 3.5 - 5.1 mmol/L - - -  Chloride 98 - 111 mmol/L - - -  CO2 22 - 32 mmol/L - - -  Calcium 8.9 - 10.3 mg/dL - - -  Total Protein 6.5 - 8.1 g/dL - - -  Total Bilirubin 0.3 - 1.2 mg/dL - - -  Alkaline Phos 38 - 126 U/L - - -  AST 15 - 41 U/L - - -  ALT 0 - 44 U/L - - -    Micro Wound culture MRSA from the right ankle ulcer.  Impression/recommendation Critical ischemia of the right foot with blistering and purple discoloration.  Status post BKA on 09/12/2020. Aortic thrombus.  Thrombophilia work-up negative  Leukocytosis.  Persist.  Could have been from the ischemic foot before that she got high-dose steroids.  She is does not have any features of sepsis currently so we will stop the vancomycin  Vasculitic ulcer right foot for the past 3 months.   Had received rituximab this admission as well as 3 doses of Solu-Medrol.  Now she has had BKA and the ulcer been removed as well.  Small vessel vasculitis with p-ANCA positivity, rheumatoid factor positive, low complement and lung involvement.  Followed by rheumatologist.  Discussed the management with the patient in detail.

## 2020-09-15 NOTE — Progress Notes (Addendum)
[facility]  - [place] [facility]   PATIENT NAME: Alexandra [name]    MR#:  [id number]  DATE OF BIRTH:  1970-07-07  SUBJECTIVE:  POD # 3 Right BKA Working with PT Eating ok, in better spirits! REVIEW OF SYSTEMS:   Review of Systems  Constitutional:  Negative for chills, fever and weight loss.  HENT:  Negative for ear discharge, ear pain and nosebleeds.   Eyes:  Negative for blurred vision, pain and discharge.  Respiratory:  Negative for sputum production, shortness of breath, wheezing and stridor.   Cardiovascular:  Negative for chest pain, palpitations, orthopnea and PND.  Gastrointestinal:  Negative for abdominal pain, diarrhea, nausea and vomiting.  Genitourinary:  Negative for frequency and urgency.  Musculoskeletal:  Negative for back pain and joint pain.  Neurological:  Positive for weakness. Negative for sensory change, speech change and focal weakness.  Psychiatric/Behavioral:  Negative for depression and hallucinations. The patient is not nervous/anxious.   Tolerating Diet:yes Tolerating PT: CIR--denied   DRUG ALLERGIES:  No Known Allergies  VITALS:  Blood pressure 104/62, pulse 88, temperature (!) 97.5 F (36.4 C), temperature source Oral, resp. rate 17, height 5\' 3"  (1.6 m), weight 57 kg, last menstrual period 01/14/2009, SpO2 97 %.  PHYSICAL EXAMINATION:   Physical Exam  GENERAL:  50 y.o.-year-old patient lying in the bed with no acute distress.  HEENT: Head atraumatic, normocephalic. Oropharynx and nasopharynx clear.  NECK:  Supple, no jugular venous distention. No thyroid enlargement, no tenderness.  LUNGS: Normal breath sounds bilaterally, no wheezing, rales, rhonchi. No use of accessory muscles of respiration.  CARDIOVASCULAR: S1, S2 normal. No murmurs, rubs, or gallops.  ABDOMEN: Soft, nontender, nondistended. Bowel sounds present. No organomegaly or mass.  EXTREMITIES: right BKA stump+ NEUROLOGIC: non focal   PSYCHIATRIC:  patient is  alert and oriented x 3.  SKIN: as above   LABORATORY PANEL:  CBC Recent Labs  Lab 09/15/20 1037  WBC 19.8*  HGB 9.6*  HCT 30.9*  PLT 451*     Chemistries  Recent Labs  Lab 09/12/20 0601 09/13/20 0452 09/15/20 0513  NA 139  --   --   K 3.6  --   --   CL 99  --   --   CO2 30  --   --   GLUCOSE 89  --   --   BUN 14  --   --   CREATININE 0.49   < > 0.34*  CALCIUM 8.7*  --   --   MG 2.2  --   --    < > = values in this interval not displayed.    Cardiac Enzymes No results for input(s): TROPONINI in the last 168 hours. RADIOLOGY:  No results found. ASSESSMENT AND PLAN:  SHANAE LUO is a 50 y.o. 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 Imaging shows early periosteal reaction which could be reactive periostitis versus early osteomyelitis.  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  Chest x-ray, CT chest with interstitial lung disease. -Concern for possible Buerger's disease with history of smoking. -Right lower extremity ultrasound negative for DVT. -Patient seen by vascular surgery, underwent angiogram without intervention with normal common femoral artery, profunda femoris artery was small but  normal, superficial femoral artery and popliteal arteries normal.  Typical tibial trifurcation with three-vessel runoff although all 3 vessels were small with poor flow into the foot and small vessel disease in the foot and ankle. 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 Recieved x1 dose (6/24)--repeat on 09/19/20 --Hypercoagulable work-up/labs ordered per hematology --7/1--for right BKA. Started IV vanc per ID --7/2--POD#1 Right BKA--doing ok. Worked with PT --7/3--POD#2 no new issues. TOC for d/c planning --7/4--Per Dr Evie Lacks stump ok. 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 -Patient being followed by vascular surgery and patient underwent mechanical thrombectomy of the aorta and right common iliac artery with kissing balloon and stent placement to bilateral common iliac arteries per vascular surgery, Dr. Wyn Quaker 09/08/2020.   -was  on IV heparin. --now on po eliquis -Status post thrombolytic therapy for ischemic foot    Depression -Continue Celexa.    Nicotine dependence --Nicotine patch.   Iron deficiency anemia -Continue oral iron supplementation.   -Hemoglobin currently at 9.2.   -Transfusion threshold hemoglobin < 7.  Thrombocytosis -Likely reactive. -Trending down.   TOC for home DME and d/c planning Pt is in agreement going home   DVT prophylaxis: eleiquis Code Status: Full Family Communication: Updated patient.  No family at bedside. Disposition:    Status is: Inpatient    Dispo: The patient is from: Home              Anticipated d/c is to: Home with [facility] PT              Patient currently is medically stable to d/c.              Difficult to place patient No        TOTAL TIME TAKING CARE OF THIS PATIENT: 25 minutes.  >50% time spent on counselling and coordination of care  Note: This dictation was prepared with Dragon dictation along with smaller  phrase technology. Any transcriptional errors that result from this process are unintentional.  [name] M.D    [facility]   CC: Primary care physician; [facility] Patient ID: BLAZE SANDIN, female   DOB: [birth year], 50 y.o.   MRN: [id number]

## 2020-09-15 NOTE — Progress Notes (Signed)
3 Days Post-Op   Subjective/Chief Complaint: Doing OK. States pain is controlled with current regimen. Has been working with PT/OOB   Objective: Vital signs in last 24 hours: Temp:  [97.8 F (36.6 C)-98.7 F (37.1 C)] 97.8 F (36.6 C) (07/04 0806) Pulse Rate:  [80-100] 80 (07/04 0806) Resp:  [16-18] 17 (07/04 0806) BP: (118-132)/(69-75) 118/70 (07/04 0806) SpO2:  [96 %-97 %] 97 % (07/04 0806) Last BM Date: 09/08/20  Intake/Output from previous day: 07/03 0701 - 07/04 0700 In: 3 [I.V.:3] Out: 1000 [Urine:1000] Intake/Output this shift: No intake/output data recorded.  General appearance: alert and no distress Extremities: RIGHT BKA site- C/D/I, soft, warm, viable  Lab Results:  Recent Labs    09/13/20 0452  WBC 17.2*  HGB 9.2*  HCT 28.9*  PLT 379   BMET Recent Labs    09/14/20 0443 09/15/20 0513  CREATININE 0.36* 0.34*   PT/INR No results for input(s): LABPROT, INR in the last 72 hours. ABG No results for input(s): PHART, HCO3 in the last 72 hours.  Invalid input(s): PCO2, PO2  Studies/Results: No results found.  Anti-infectives: Anti-infectives (From admission, onward)    Start     Dose/Rate Route Frequency Ordered Stop   09/13/20 0800  vancomycin (VANCOREADY) IVPB 1250 mg/250 mL        1,250 mg 166.7 mL/hr over 90 Minutes Intravenous Daily 09/12/20 1547     09/12/20 1230  vancomycin (VANCOCIN) 1-5 GM/200ML-% IVPB       Note to Pharmacy: Guadalupe Dawn   : cabinet override      09/12/20 1230 09/12/20 1249   09/12/20 1152  ceFAZolin (ANCEF) 2-4 GM/100ML-% IVPB       Note to Pharmacy: Desma Paganini   : cabinet override      09/12/20 1152 09/12/20 1249   09/12/20 1130  ceFAZolin (ANCEF) IVPB 2g/100 mL premix        2 g 200 mL/hr over 30 Minutes Intravenous On call to O.R. 09/12/20 1124 09/12/20 1236   09/12/20 1122  vancomycin (VANCOCIN) IVPB 1000 mg/200 mL premix        1,000 mg 200 mL/hr over 60 Minutes Intravenous 30 min pre-op 09/12/20 1123  09/12/20 1249   09/09/20 1015  ceFAZolin (ANCEF) IVPB 2g/100 mL premix        2 g 200 mL/hr over 30 Minutes Intravenous On call to O.R. 09/09/20 0915 09/10/20 0559   09/08/20 1242  ceFAZolin (ANCEF) 2-4 GM/100ML-% IVPB       Note to Pharmacy: Guadalupe Dawn   : cabinet override      09/08/20 1242 09/09/20 0044   09/07/20 1231  ceFAZolin (ANCEF) 2-4 GM/100ML-% IVPB       Note to Pharmacy: Rita Ohara   : cabinet override      09/07/20 1231 09/08/20 0044   09/03/20 1500  sulfamethoxazole-trimethoprim (BACTRIM DS) 800-160 MG per tablet 1 tablet  Status:  Discontinued        1 tablet Oral Every 12 hours 09/03/20 1411 09/12/20 1119   09/01/20 2200  amoxicillin-clavulanate (AUGMENTIN) 875-125 MG per tablet 1 tablet  Status:  Discontinued        1 tablet Oral Every 12 hours 09/01/20 1521 09/03/20 1411   08/31/20 2305  ceFAZolin (ANCEF) IVPB 2g/100 mL premix        2 g 200 mL/hr over 30 Minutes Intravenous 30 min pre-op 08/31/20 2305 09/01/20 1333   08/29/20 2200  vancomycin (VANCOREADY) IVPB 1250 mg/250 mL  Status:  Discontinued  1,250 mg 166.7 mL/hr over 90 Minutes Intravenous Every 24 hours 08/29/20 1359 08/31/20 0723   08/29/20 1400  vancomycin (VANCOREADY) IVPB 750 mg/150 mL  Status:  Discontinued        750 mg 150 mL/hr over 60 Minutes Intravenous Every 12 hours 08/29/20 0208 08/29/20 1359   08/29/20 0130  metroNIDAZOLE (FLAGYL) IVPB 500 mg  Status:  Discontinued        500 mg 100 mL/hr over 60 Minutes Intravenous Every 8 hours 08/29/20 0022 09/01/20 1521   08/29/20 0100  cefTRIAXone (ROCEPHIN) 2 g in sodium chloride 0.9 % 100 mL IVPB  Status:  Discontinued        2 g 200 mL/hr over 30 Minutes Intravenous Every 24 hours 08/29/20 0022 09/01/20 1521   08/28/20 2330  vancomycin (VANCOREADY) IVPB 1250 mg/250 mL        1,250 mg 166.7 mL/hr over 90 Minutes Intravenous  Once 08/28/20 2315 08/29/20 0330   08/28/20 2330  piperacillin-tazobactam (ZOSYN) IVPB 3.375 g        3.375  g 100 mL/hr over 30 Minutes Intravenous  Once 08/28/20 2315 08/28/20 2359       Assessment/Plan: s/p Procedure(s): AMPUTATION BELOW KNEE (Right) Continue OOB with PT/OT Discharge planning Daily dressing changes Check WBC. Can likely d/c Vanco  LOS: 18 days    Eli Hose A 09/15/2020

## 2020-09-16 ENCOUNTER — Encounter: Payer: Self-pay | Admitting: Oncology

## 2020-09-16 ENCOUNTER — Other Ambulatory Visit: Payer: Self-pay

## 2020-09-16 DIAGNOSIS — L97909 Non-pressure chronic ulcer of unspecified part of unspecified lower leg with unspecified severity: Secondary | ICD-10-CM

## 2020-09-16 LAB — PROTHROMBIN GENE MUTATION

## 2020-09-16 MED ORDER — FERROUS GLUCONATE 324 (38 FE) MG PO TABS
324.0000 mg | ORAL_TABLET | Freq: Two times a day (BID) | ORAL | 3 refills | Status: DC
  Filled 2020-09-16: qty 60, 30d supply, fill #0

## 2020-09-16 MED ORDER — CITALOPRAM HYDROBROMIDE 40 MG PO TABS
40.0000 mg | ORAL_TABLET | Freq: Every day | ORAL | 2 refills | Status: DC
  Filled 2020-09-16: qty 30, 30d supply, fill #0

## 2020-09-16 MED ORDER — CITALOPRAM HYDROBROMIDE 20 MG PO TABS: 40.0000 mg | ORAL_TABLET | Freq: Every day | ORAL | Status: DC

## 2020-09-16 MED ORDER — HYDROMORPHONE HCL 2 MG PO TABS: 1.0000 mg | ORAL_TABLET | ORAL | Status: DC | PRN

## 2020-09-16 MED ORDER — PREDNISONE 20 MG PO TABS
40.0000 mg | ORAL_TABLET | Freq: Every day | ORAL | 1 refills | Status: DC
  Filled 2020-09-16: qty 30, 15d supply, fill #0

## 2020-09-16 MED ORDER — HYDROCODONE-ACETAMINOPHEN 5-325 MG PO TABS: 1.0000 | ORAL_TABLET | Freq: Four times a day (QID) | ORAL | 0 refills | Status: DC | PRN

## 2020-09-16 MED ORDER — ATORVASTATIN CALCIUM 40 MG PO TABS
40.0000 mg | ORAL_TABLET | Freq: Every day | ORAL | 4 refills | Status: DC
  Filled 2020-09-16: qty 30, 30d supply, fill #0

## 2020-09-16 MED ORDER — HYDROMORPHONE HCL 2 MG PO TABS
2.0000 mg | ORAL_TABLET | ORAL | Status: DC | PRN
  Administered 2020-09-16: 2 mg via ORAL
  Filled 2020-09-16: qty 1

## 2020-09-16 MED ORDER — HYDROMORPHONE HCL 2 MG PO TABS: 2.0000 mg | ORAL_TABLET | Freq: Four times a day (QID) | ORAL | 0 refills | Status: DC | PRN

## 2020-09-16 MED ORDER — NICOTINE 21 MG/24HR TD PT24
21.0000 mg | MEDICATED_PATCH | Freq: Every day | TRANSDERMAL | 0 refills | Status: DC
  Filled 2020-09-16: qty 28, 28d supply, fill #0

## 2020-09-16 MED ORDER — APIXABAN 5 MG PO TABS
5.0000 mg | ORAL_TABLET | Freq: Two times a day (BID) | ORAL | 4 refills | Status: DC
  Filled 2020-09-16: qty 60, 30d supply, fill #0

## 2020-09-16 NOTE — Plan of Care (Signed)
Patient discharged to home with home health and family.  Patient states DME arrived at home.  Dressing change completed with patient and mom who verbalized understanding.  Supplies given.  Reviewed medication, appointment follow-up and discharge instructions with patient.  Medication management delivered medication to patient. Norco given for pain control for transport.  Patient assisted to car via w/c and SBA.

## 2020-09-16 NOTE — Discharge Instructions (Addendum)
Vascular Surgery Discharge Instructions:  1) You may shower. Keep your stump clean and dry. Gently clean with soap and water. Gently pat dry. 2) Xeroform to staple line, then ABD then kerlix or ace bandage. 3) Please keep your knee flexible at the joint.

## 2020-09-16 NOTE — TOC Transition Note (Signed)
Transition of Care [facility]) - CM/SW Discharge Note   Patient Details  Name: Alexandra [name] MRN: [id number] Date of Birth: June 27, 1970  Transition of Care [facility]) CM/SW Contact:  Chapman Fitch, RN Phone Number: 09/16/2020, 2:47 PM   Clinical Narrative:     Patient to discharge today Mom to transport Mom to come to bedside and bedside RN to provided wound care education  Patient confirms that tub bench and BSC have been delivered to the home  Medications have been filled at [facility]  and will be delivered to bedside RN prior to discharge  Pain medication to be picked up by patient's mother at [facility]  Patient has been approved for home health services by [facility]. [place] with [facility] notified of discharge. LOG has been approved by [facility] supervisor.    MD and bedside RN updated Patient in agreement with DC plan  Final next level of care: Home w Home Health Services Barriers to Discharge: No Barriers Identified   Patient Goals and CMS Choice        Discharge Placement                       Discharge Plan and Services                          HH Arranged: PT, OT, RN [facility] Agency: [facility] Date [facility] Agency Contacted: 09/16/20   Representative spoke with at [facility] Agency: [place]  Social Determinants of Health (SDOH) Interventions     Readmission Risk Interventions Readmission Risk Prevention Plan 09/16/2020  Transportation Screening Complete  PCP or Specialist Appt within 3-5 Days Complete  HRI or Home Care Consult Complete  Palliative Care Screening Not Applicable  Medication Review (RN Care Manager) Complete  Some recent data might be hidden

## 2020-09-16 NOTE — Progress Notes (Signed)
Physical Therapy Treatment Patient Details Name: Alexandra [name] MRN: [id number] DOB: June 10, 1970 Today's Date: 09/16/2020    History of Present Illness Alexandra [name] is a 50 y.o. 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    PT Comments    Pt was pleasant and with encouragement was motivated to participate during the session. Pt gave good effort throughout session. Pt performed all functional mobility without physical assistance. Patient educated on physiological benefits of controlled eccentric lowering secondary to poor eccentric control performing stand<>sit. Pt performed stand<>sit with good eccentric control following education. Pt reported having one step to enter home and she successfully ascended/descended one step using RW with min guard A. Pt steady while ambulating with no LOB. Pt reminded of local amputee support group as a resource if desired.  Pt will benefit from PT services in a SNF setting upon discharge to safely address deficits listed in patient problem list for decreased caregiver assistance and eventual return to PLOF.   Follow Up Recommendations  SNF     Equipment Recommendations  Rolling walker with 5" wheels;3in1 (PT)    Recommendations for Other Services       Precautions / Restrictions Precautions Precautions: Fall Restrictions Weight Bearing Restrictions: Yes RLE Weight Bearing: Non weight bearing Other Position/Activity Restrictions: s/p R BKA    Mobility  Bed Mobility Overal bed mobility: Needs Assistance Bed Mobility: Supine to Sit     Supine to sit: Supervision          Transfers Overall transfer level: Needs assistance Equipment used: Rolling walker (2  wheeled) Transfers: Sit to/from Stand Sit to Stand: Supervision            Ambulation/Gait Ambulation/Gait assistance: Min guard Gait Distance (Feet): 30 Feet Assistive device: Rolling walker (2 wheeled)   Gait velocity: decreased   General Gait Details: Hop-to pattern 2/2 R BKA. Good UE strength noted. Pt is very steady using RW with no LOB.   Stairs Stairs: Yes Stairs assistance: Min guard Stair Management: Backwards;With walker Number of Stairs: 1step x2 General stair comments: Stair training provided verbally and via demonstration. Pt ascended step backwards and descended forward.   Wheelchair Mobility    Modified Rankin (Stroke Patients Only)       Balance Overall balance assessment: Needs assistance Sitting-balance support: Feet supported Sitting balance-Leahy Scale: Normal     Standing balance support: Bilateral upper extremity supported Standing balance-Leahy Scale: Fair                              Cognition Arousal/Alertness: Awake/alert Behavior During Therapy: WFL for tasks assessed/performed Overall Cognitive Status: Within Functional Limits for tasks assessed                                        Exercises Total Joint Exercises Quad Sets: AROM;Right;10 reps;Supine Long Arc Quad: AROM;Strengthening;Both;10 reps;Seated Other Exercises Other Exercises: Smoking cessation education provided verbally Other Exercises: Car transfer education provided verbally and via simulated demonstration Other Exercises: Transfer training for improved eccentric lowering Other Exercises: Stair navigation training verbally and via demonstration Other Exercises: HEP per handout and  RLE positioning education    General Comments        Pertinent Vitals/Pain Pain Assessment: No/denies pain    Home Living                      Prior Function            PT Goals (current goals can now be found in the care plan section)  Progress towards PT goals: Progressing toward goals    Frequency    7X/week      PT Plan Current plan remains appropriate    Co-evaluation              AM-PAC PT "6 Clicks" Mobility   Outcome Measure  Help needed turning from your back to your side while in a flat bed without using bedrails?: A Little Help needed moving from lying on your back to sitting on the side of a flat bed without using bedrails?: A Little Help needed moving to and from a bed to a chair (including a wheelchair)?: A Little Help needed standing up from a chair using your arms (e.g., wheelchair or bedside chair)?: A Little Help needed to walk in hospital room?: A Little Help needed climbing 3-5 steps with a railing? : A Little 6 Click Score: 18    End of Session Equipment Utilized During Treatment: Gait belt Activity Tolerance: Patient tolerated treatment well Patient left: in bed;with call bell/phone within reach;with bed alarm set Nurse Communication: Mobility status PT Visit Diagnosis: Unsteadiness on feet (R26.81);Other abnormalities of gait and mobility (R26.89);Muscle weakness (generalized) (M62.81);Difficulty in walking, not elsewhere classified (R26.2);Pain Pain - Right/Left: Right Pain - part of body: Leg     Time: 6767-2094 PT Time Calculation (min) (ACUTE ONLY): 41 min  Charges:                        Desiree Hane SPT 09/16/20, 3:26 PM

## 2020-09-16 NOTE — Discharge Summary (Signed)
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 Breast discharge 2013   present X 3 years  Left only multiple ducts per pt  . Depression     HOSPITAL COURSE:  Alexandra [name] is a 50 y.o. 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 Imaging shows early periosteal reaction which could be reactive periostitis versus early osteomyelitis.   Nonhealing right lateral malleolus ulcer/cellulitis-P ANCA vasculitis Critical Right foot ischemia--s/p BKA --Patient noted to P ANCA positive.   --Chest x-ray, CT chest with interstitial lung disease. -Concern for possible Buerger's disease with history of smoking. -Right lower extremity ultrasound negative for DVT. -Patient seen by vascular surgery, underwent angiogram without intervention with normal common femoral artery, profunda femoris artery was small but normal, superficial femoral artery and popliteal arteries normal.  Typical tibial trifurcation with three-vessel runoff although all 3 vessels were small with poor flow into the foot and small vessel disease in the foot and ankle. 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 Recieved x1 dose (6/24)--repeat on 09/19/20--appt made . 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 -Patient being followed by vascular surgery and patient underwent mechanical thrombectomy of the aorta and right common iliac artery with kissing balloon and stent placement to bilateral common iliac arteries per vascular surgery, Dr. Wyn Quakerew 09/08/2020.   -was  on IV heparin. --now on po eliquis   Depression -Continue Celexa.    Nicotine dependence --Nicotine patch.   Iron deficiency anemia -Continue oral iron supplementation.   -Hemoglobin currently at 9.2.   -Transfusion threshold hemoglobin < 7.  Thrombocytosis -Likely reactive. -Trending down.   TOC for home DME and d/c planning Pt is in agreement going home today.   DVT prophylaxis: eliquis Code Status: Full Family Communication: Updated patient.  No family at bedside. Disposition:    Status is: Inpatient    Dispo: The patient is from: Home              [facility]              Patient currently is medically stable to d/c.  CONSULTS OBTAINED:  Treatment Team:  Lynn Ito, MD Jeralyn Ruths, MD Annice Needy, MD  DRUG ALLERGIES:  No Known Allergies  DISCHARGE MEDICATIONS:   Allergies as  of 09/16/2020   No Known Allergies      Medication List     STOP taking these medications    ARIPiprazole 2 MG tablet Commonly known as: ABILIFY   aspirin 81 MG EC tablet   buPROPion 100 MG tablet Commonly known as: WELLBUTRIN   DULoxetine 60 MG capsule Commonly known as: CYMBALTA   gabapentin 100 MG capsule Commonly known as: NEURONTIN   mupirocin ointment 2 % Commonly known as: BACTROBAN   PRESCRIPTION MEDICATION   valACYclovir 1000 MG tablet Commonly known as: VALTREX       TAKE these medications    apixaban 5 MG Tabs tablet Commonly known as: ELIQUIS Take 1 tablet (5 mg total) by mouth 2 (two) times daily.   atorvastatin 40 MG tablet Commonly known as: LIPITOR Take 1 tablet (40 mg total) by mouth daily. Start taking on: September 17, 2020   cholecalciferol 25 MCG (1000 UNIT) tablet Commonly known as: VITAMIN D Take 1,000 Units by mouth daily. For Vitamin D maintenance after completion of Vitamin D treatment.   citalopram 40 MG tablet Commonly known as: CELEXA Take 1 tablet (40 mg total) by mouth daily. Start taking on: September 17, 2020   ferrous gluconate 324 MG tablet Commonly known as: FERGON Take 1 tablet (324 mg total) by mouth 2 (two) times daily with a meal.   HYDROcodone-acetaminophen 5-325 MG tablet Commonly known as: NORCO/VICODIN Take 1-2 tablets by mouth every 6 (six) hours as needed for severe pain or moderate pain.   HYDROmorphone 2 MG tablet Commonly known as: DILAUDID Take 1 tablet (2 mg total) by mouth every 6 (six) hours as needed for severe pain.   nicotine 21 mg/24hr patch Commonly known as: NICODERM CQ - dosed in mg/24 hours Place 1 patch (21 mg total) onto the skin daily.   predniSONE 20 MG tablet Commonly known as: DELTASONE Take 2 tablets (40 mg total) by mouth daily before breakfast. Start taking on: September 17, 2020               Durable Medical Equipment  (From admission, onward)           Start     Ordered    09/15/20 1208  For home use only DME Tub bench  Once       Comments: TUB transfer bench   09/15/20 1207   09/15/20 1002  For home use only DME 3 n 1  Once        09/15/20 1001              Discharge Care Instructions  (From admission, onward)           Start     Ordered   09/16/20 0000  Discharge wound care:       Comments: As per Vascular sx described in the discharge instructions   09/16/20 0954            If you experience worsening of your admission symptoms, develop shortness of breath, life threatening emergency, suicidal or homicidal thoughts you must seek medical attention immediately by calling 911 or calling your MD immediately  if symptoms less severe.  You Must read complete instructions/literature along with all the possible adverse reactions/side effects for all the Medicines you take and that have been prescribed to  you. Take any new Medicines after you have completely understood and accept all the possible adverse reactions/side effects.   Please note  You were cared for by a hospitalist during your hospital stay. If you have any questions about your discharge medications or the care you received while you were in the hospital after you are discharged, you can call the unit and asked to speak with the hospitalist on call if the hospitalist that took care of you is not available. Once you are discharged, your primary care physician will handle any further medical issues. Please note that NO REFILLS for any discharge medications will be authorized once you are discharged, as it is imperative that you return to your primary care physician (or establish a relationship with a primary care physician if you do not have one) for your aftercare needs so that they can reassess your need for medications and monitor your lab values. Today   SUBJECTIVE   No new complaints. Pain with activity  VITAL SIGNS:  Blood pressure 119/72, pulse 91, temperature 99.1 F (37.3 C),  temperature source Oral, resp. rate 16, height 5\' 3"  (1.6 m), weight 57 kg, last menstrual period 01/14/2009, SpO2 97 %.  I/O:   Intake/Output Summary (Last 24 hours) at 09/16/2020 0958 Last data filed at 09/15/2020 2316 Gross per 24 hour  Intake 364.86 ml  Output 1300 ml  Net -935.14 ml    PHYSICAL EXAMINATION:  GENERAL:  50 y.o.-year-old patient lying in the bed with no acute distress.  LUNGS: Normal breath sounds bilaterally, no wheezing, rales,rhonchi or crepitation. No use of accessory muscles of respiration.  CARDIOVASCULAR: S1, S2 normal. No murmurs, rubs, or gallops.  ABDOMEN: Soft, non-tender, non-distended. Bowel sounds present. No organomegaly or mass.  EXTREMITIES: right BKA stump dressing+ NEUROLOGIC: nonfocal PSYCHIATRIC: The patient is alert and oriented x 3.  SKIN: No obvious rash, lesion, or ulcer.   DATA REVIEW:   CBC  Recent Labs  Lab 09/15/20 1037  WBC 19.8*  HGB 9.6*  HCT 30.9*  PLT 451*    Chemistries  Recent Labs  Lab 09/12/20 0601 09/13/20 0452 09/15/20 0513  NA 139  --   --   K 3.6  --   --   CL 99  --   --   CO2 30  --   --   GLUCOSE 89  --   --   BUN 14  --   --   CREATININE 0.49   < > 0.34*  CALCIUM 8.7*  --   --   MG 2.2  --   --    < > = values in this interval not displayed.    Microbiology Results   No results found for this or any previous visit (from the past 240 hour(s)).  RADIOLOGY:  No results found.   CODE STATUS:     Code Status Orders  (From admission, onward)           Start     Ordered   08/29/20 0023  Full code  Continuous        08/29/20 0023           Code Status History     Date Active Date Inactive Code Status Order ID Comments User Context   07/26/2020 2214 07/30/2020 2349 Full Code 08/01/2020  [id number], MD ED   02/16/2016 2322 02/19/2016 1455 Full Code 14/09/2015  [id number], MD Inpatient        TOTAL TIME TAKING CARE  OF THIS PATIENT: 35 minutes.    [name] M.D  [facility]    CC: Primary care physician; [facility]

## 2020-09-16 NOTE — Progress Notes (Signed)
OT Cancellation Note  Patient Details Name: Alexandra [name] MRN: [id number] DOB: [birth year]   Cancelled Treatment:    Reason Eval/Treat Not Completed: Other (comment). Pt working with PT. Will re-attempt OT tx at later date/time as pt is available and appropriate.   Wynona Canes, MPH, MS, OTR/L ascom [phone] 09/16/20, 1:48 PM

## 2020-09-17 LAB — MISC LABCORP TEST (SEND OUT)
Labcorp test code: 163059
Labcorp test code: 163067

## 2020-09-17 LAB — SURGICAL PATHOLOGY

## 2020-09-18 ENCOUNTER — Other Ambulatory Visit: Payer: Self-pay

## 2020-09-18 DIAGNOSIS — I776 Arteritis, unspecified: Secondary | ICD-10-CM

## 2020-09-18 LAB — ANCA TITERS
Atypical P-ANCA titer: <1:20 {titer}
C-ANCA: <1:20 {titer}
P-ANCA: 1:80 {titer} — ABNORMAL HIGH

## 2020-09-19 ENCOUNTER — Inpatient Hospital Stay (HOSPITAL_BASED_OUTPATIENT_CLINIC_OR_DEPARTMENT_OTHER): Payer: Medicaid Other | Admitting: Oncology

## 2020-09-19 ENCOUNTER — Other Ambulatory Visit: Payer: Self-pay

## 2020-09-19 ENCOUNTER — Inpatient Hospital Stay: Payer: Medicaid Other

## 2020-09-19 ENCOUNTER — Inpatient Hospital Stay: Payer: Medicaid Other | Attending: Oncology

## 2020-09-19 VITALS — BP 106/51 | HR 65 | Temp 97.1°F | Resp 16

## 2020-09-19 VITALS — BP 92/54 | HR 83 | Temp 98.3°F | Resp 18 | Wt 125.0 lb

## 2020-09-19 DIAGNOSIS — I7789 Other specified disorders of arteries and arterioles: Secondary | ICD-10-CM | POA: Insufficient documentation

## 2020-09-19 DIAGNOSIS — F17210 Nicotine dependence, cigarettes, uncomplicated: Secondary | ICD-10-CM | POA: Diagnosis not present

## 2020-09-19 DIAGNOSIS — Z79899 Other long term (current) drug therapy: Secondary | ICD-10-CM | POA: Diagnosis not present

## 2020-09-19 DIAGNOSIS — Z89511 Acquired absence of right leg below knee: Secondary | ICD-10-CM | POA: Diagnosis not present

## 2020-09-19 DIAGNOSIS — I776 Arteritis, unspecified: Secondary | ICD-10-CM

## 2020-09-19 DIAGNOSIS — Z5112 Encounter for antineoplastic immunotherapy: Secondary | ICD-10-CM | POA: Diagnosis not present

## 2020-09-19 DIAGNOSIS — D509 Iron deficiency anemia, unspecified: Secondary | ICD-10-CM | POA: Diagnosis not present

## 2020-09-19 DIAGNOSIS — M064 Inflammatory polyarthropathy: Secondary | ICD-10-CM | POA: Insufficient documentation

## 2020-09-19 DIAGNOSIS — Z7952 Long term (current) use of systemic steroids: Secondary | ICD-10-CM | POA: Diagnosis not present

## 2020-09-19 DIAGNOSIS — Z7901 Long term (current) use of anticoagulants: Secondary | ICD-10-CM | POA: Insufficient documentation

## 2020-09-19 LAB — CBC WITH DIFFERENTIAL/PLATELET
Abs Immature Granulocytes: 0.09 10*3/uL — ABNORMAL HIGH (ref 0.00–0.07)
Basophils Absolute: 0 10*3/uL (ref 0.0–0.1)
Basophils Relative: 0 %
Eosinophils Absolute: 0 10*3/uL (ref 0.0–0.5)
Eosinophils Relative: 0 %
HCT: 27.8 % — ABNORMAL LOW (ref 36.0–46.0)
Hemoglobin: 8.6 g/dL — ABNORMAL LOW (ref 12.0–15.0)
Immature Granulocytes: 1 %
Lymphocytes Relative: 9 %
Lymphs Abs: 1.2 10*3/uL (ref 0.7–4.0)
MCH: 24.3 pg — ABNORMAL LOW (ref 26.0–34.0)
MCHC: 30.9 g/dL (ref 30.0–36.0)
MCV: 78.5 fL — ABNORMAL LOW (ref 80.0–100.0)
Monocytes Absolute: 0.5 10*3/uL (ref 0.1–1.0)
Monocytes Relative: 4 %
Neutro Abs: 11.4 10*3/uL — ABNORMAL HIGH (ref 1.7–7.7)
Neutrophils Relative %: 86 %
Platelets: 427 10*3/uL — ABNORMAL HIGH (ref 150–400)
RBC: 3.54 MIL/uL — ABNORMAL LOW (ref 3.87–5.11)
RDW: 19.6 % — ABNORMAL HIGH (ref 11.5–15.5)
WBC: 13.2 10*3/uL — ABNORMAL HIGH (ref 4.0–10.5)
nRBC: 0 % (ref 0.0–0.2)

## 2020-09-19 LAB — COMPREHENSIVE METABOLIC PANEL
ALT: 25 U/L (ref 0–44)
AST: 23 U/L (ref 15–41)
Albumin: 3 g/dL — ABNORMAL LOW (ref 3.5–5.0)
Alkaline Phosphatase: 59 U/L (ref 38–126)
Anion gap: 10 (ref 5–15)
BUN: 29 mg/dL — ABNORMAL HIGH (ref 6–20)
CO2: 27 mmol/L (ref 22–32)
Calcium: 8.9 mg/dL (ref 8.9–10.3)
Chloride: 98 mmol/L (ref 98–111)
Creatinine, Ser: 0.34 mg/dL — ABNORMAL LOW (ref 0.44–1.00)
GFR, Estimated: >60 mL/min (ref >60)
Glucose, Bld: 140 mg/dL — ABNORMAL HIGH (ref 70–99)
Potassium: 3.8 mmol/L (ref 3.5–5.1)
Sodium: 135 mmol/L (ref 135–145)
Total Bilirubin: 0.5 mg/dL (ref 0.3–1.2)
Total Protein: 6.1 g/dL — ABNORMAL LOW (ref 6.5–8.1)

## 2020-09-19 MED ORDER — DIPHENHYDRAMINE HCL 50 MG/ML IJ SOLN
25.0000 mg | Freq: Once | INTRAMUSCULAR | Status: AC
  Administered 2020-09-19: 25 mg via INTRAVENOUS
  Filled 2020-09-19: qty 1

## 2020-09-19 MED ORDER — ACETAMINOPHEN 325 MG PO TABS
650.0000 mg | ORAL_TABLET | Freq: Once | ORAL | Status: AC
  Administered 2020-09-19: 650 mg via ORAL
  Filled 2020-09-19: qty 2

## 2020-09-19 MED ORDER — SODIUM CHLORIDE 0.9 % IV SOLN
1000.0000 mg | Freq: Once | INTRAVENOUS | Status: AC
  Administered 2020-09-19: 1000 mg via INTRAVENOUS
  Filled 2020-09-19: qty 100

## 2020-09-19 MED ORDER — SODIUM CHLORIDE 0.9 % IV SOLN: 1000.0000 mg | Freq: Once | INTRAVENOUS | Status: DC

## 2020-09-19 MED ORDER — SODIUM CHLORIDE 0.9 % IV SOLN
Freq: Once | INTRAVENOUS | Status: AC
Start: 2020-09-19 — End: 2020-09-19
  Filled 2020-09-19: qty 250

## 2020-09-19 NOTE — Patient Instructions (Signed)
[name]  Discharge Instructions: Thank you for choosing [name] to provide your oncology and hematology care.  If you have a lab appointment with the [name], please go directly to the [name] and check in at the registration area.  Wear comfortable clothing and clothing appropriate for easy access to any Portacath or PICC line.   We strive to give you quality time with your provider. You may need to reschedule your appointment if you arrive late (15 or more minutes).  Arriving late affects you and other patients whose appointments are after yours.  Also, if you miss three or more appointments without notifying the office, you may be dismissed from the clinic at the provider's discretion.      For prescription refill requests, have your pharmacy contact our office and allow 72 hours for refills to be completed.    Today you received the following chemotherapy and/or immunotherapy agents : Rituxan    To help prevent nausea and vomiting after your treatment, we encourage you to take your nausea medication as directed.  BELOW ARE SYMPTOMS THAT SHOULD BE REPORTED IMMEDIATELY: *FEVER GREATER THAN 100.4 F (38 C) OR HIGHER *CHILLS OR SWEATING *NAUSEA AND VOMITING THAT IS NOT CONTROLLED WITH YOUR NAUSEA MEDICATION *UNUSUAL SHORTNESS OF BREATH *UNUSUAL BRUISING OR BLEEDING *URINARY PROBLEMS (pain or burning when urinating, or frequent urination) *BOWEL PROBLEMS (unusual diarrhea, constipation, pain near the anus) TENDERNESS IN MOUTH AND THROAT WITH OR WITHOUT PRESENCE OF ULCERS (sore throat, sores in mouth, or a toothache) UNUSUAL RASH, SWELLING OR PAIN  UNUSUAL VAGINAL DISCHARGE OR ITCHING   Items with * indicate a potential emergency and should be followed up as soon as possible or go to the Emergency Department if any problems should occur.  Please show the CHEMOTHERAPY ALERT CARD or IMMUNOTHERAPY ALERT CARD at check-in to  the Emergency Department and triage nurse.  Should you have questions after your visit or need to cancel or reschedule your appointment, please contact [name]  [phone] and follow the prompts.  Office hours are 8:00 a.m. to 4:30 p.m. Monday - Friday. Please note that voicemails left after 4:00 p.m. may not be returned until the following business day.  We are closed weekends and major holidays. You have access to a nurse at all times for urgent questions. Please call the main number to the clinic [phone] and follow the prompts.  For any non-urgent questions, you may also contact your provider using MyChart. We now offer e-Visits for anyone 18 and older to request care online for non-urgent symptoms. For details visit [email or web address].   Also download the MyChart app! Go to the app store, search "MyChart", open the app, select [facility], and log in with your MyChart username and password.  Due to Covid, a mask is required upon entering the hospital/clinic. If you do not have a mask, one will be given to you upon arrival. For doctor visits, patients may have 1 support person aged 18 or older with them. For treatment visits, patients cannot have anyone with them due to current Covid guidelines and our immunocompromised population.  

## 2020-09-19 NOTE — Progress Notes (Signed)
Ok for rapid Rituxan per NP.

## 2020-09-19 NOTE — Progress Notes (Signed)
Recent right BKA. Discharged from hospital on Tuesday 7/5. Follow-up rituximab therapy for vasculitis. OK to treat with BP 92/54, per Mignon Pine, NP.

## 2020-09-19 NOTE — Progress Notes (Signed)
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  She presented with worsening pain of her right lower extremity ulcer with increased drainage and foul odor.  Imaging showed early periostitis versus osteomyelitis.  She was found to be P ANCA positive.  Her right lower extremity was negative for a blood clot.  Angiogram completed by vascular surgery showed normal common femoral artery but poor flow in 3 vessels into the foot and small vessel disease in foot and ankle.  Drainage confirmed abundant MRSA with mixed organisms and she was started on IV Bactrim and vancomycin during her stay.  She was also placed on IV Solu-Medrol and transition to p.o. prednisone.  Incidental findings on a CT angio of abdominal aorta showed thrombus of her aorta.  She underwent mechanical thrombectomy of the aorta and right common iliac artery with stent placement by Dr. Wyn Quaker on 09/08/2020.  She was placed on IV heparin and transition to p.o. Eliquis.  Thrombophilia work-up was negative.  She received Rituxan during her admission along with high-dose steroids rheumatology recommended 2 doses of Rituxan separated by 14 days.  She presents today to the cancer center  Interval history: Since discharge from the hospital she has been doing well.  Clara, her friend has been helping her change her dressing on her  leg.  She has been taking iron supplements as prescribed without constipation or abdominal bloating/discomfort.  Reports soft to hard stools.  Denies any bleeding.  Reports feeling tired.  Has intermittent pain in her left great toe for the past few days.  She is unsure if she bruised it or if this is due to her vasculitis.  She is calling Dr. Driscilla Grammes office later today.  She has follow-up with rheumatology and vascular this month.  REVIEW OF SYSTEMS:   Review of Systems  Constitutional:  Positive for malaise/fatigue. Negative for chills, fever and weight loss.  HENT:  Negative for congestion, ear pain and tinnitus.   Eyes: Negative.  Negative for blurred vision and double vision.  Respiratory: Negative.  Negative for cough, sputum production and shortness of breath.   Cardiovascular: Negative.  Negative for chest pain, palpitations and leg swelling.  Gastrointestinal: Negative.  Negative for abdominal pain, constipation, diarrhea, nausea and vomiting.  Genitourinary:  Negative for dysuria, frequency and urgency.  Musculoskeletal:  Negative for back pain and falls.         Status post right BKA  Skin: Negative.  Negative for rash.  Neurological:  Positive for weakness. Negative for headaches.  Endo/Heme/Allergies: Negative.  Does not bruise/bleed easily.  Psychiatric/Behavioral:  Negative for depression. The patient is nervous/anxious. The patient does not have insomnia.    As per HPI. Otherwise, a complete review of systems is negative.  PAST MEDICAL HISTORY: Past Medical History:  Diagnosis Date   Breast discharge 2013   present X 3 years  Left only multiple ducts per pt   Depression     PAST SURGICAL HISTORY: Past Surgical History:  Procedure Laterality Date   ABDOMINAL HYSTERECTOMY     AMPUTATION Right 09/12/2020   Procedure: AMPUTATION BELOW KNEE;  Surgeon: Annice Needy, MD;  Location: [facility] ORS;  Service: Vascular;  Laterality: Right;   BREAST CYST EXCISION Left 2006    ENDOVASCULAR STENT INSERTION N/A 09/08/2020   Procedure: ENDOVASCULAR STENT GRAFT INSERTION;  Surgeon: Annice Needy, MD;  Location: [facility] ORS;  Service: Vascular;  Laterality: N/A;   LOWER EXTREMITY ANGIOGRAPHY Right 09/01/2020   Procedure: Lower Extremity Angiography;  Surgeon: Annice Needy, MD;  Location: [facility] INVASIVE CV LAB;  Service: Cardiovascular;  Laterality: Right;   LOWER EXTREMITY INTERVENTION Right 09/07/2020   Procedure: LOWER EXTREMITY INTERVENTION;  Surgeon: Nada Libman, MD;  Location: [facility] INVASIVE CV LAB;  Service: Cardiovascular;  Laterality: Right;    FAMILY HISTORY: Family History  Problem Relation Age of Onset   Breast cancer Mother 29   Breast cancer Maternal Aunt    Breast cancer Maternal Grandmother    Breast cancer Maternal Aunt        Maternal Great Aunt    Breast cancer Cousin 19    ADVANCED DIRECTIVES (Y/N):  N  HEALTH MAINTENANCE: Social History   Tobacco Use   Smoking status: Every Day    Packs/day: 1.00    Years: 29.00    Pack years: 29.00    Types: Cigarettes   Smokeless tobacco: Never  Vaping Use   Vaping Use: Never used  Substance Use Topics   Alcohol use: Yes    Alcohol/week: 12.0 standard drinks    Types: 12 Cans of beer per week    Comment: social   Drug use: No     Colonoscopy:  PAP:  Bone density:  Lipid panel:  No Known Allergies  Current Outpatient Medications  Medication Sig Dispense Refill   apixaban (ELIQUIS) 5 MG TABS tablet Take 1 tablet (5 mg total) by mouth 2 (two) times daily. 60 tablet 4   atorvastatin (LIPITOR) 40 MG tablet Take 1 tablet (40 mg total) by mouth once daily. 30 tablet 4   cholecalciferol (VITAMIN D) 25 MCG (1000 UNIT) tablet Take 1,000 Units by mouth daily. For Vitamin D maintenance after completion of Vitamin D treatment.     citalopram (CELEXA) 40 MG tablet Take 1 tablet (40 mg total) by mouth once daily. 30 tablet 2   ferrous gluconate (FERGON) 324 MG tablet Take 1 tablet (324 mg total) by mouth  2 (two) times daily with a meal. 60 tablet 3   HYDROcodone-acetaminophen (NORCO/VICODIN) 5-325 MG tablet Take 1-2 tablets by mouth every 6 (six) hours as needed for severe pain or moderate pain. 30 tablet 0   HYDROmorphone (DILAUDID) 2 MG tablet Take 1 tablet (2 mg total) by mouth every 6 (six) hours as needed for severe pain. 20 tablet 0   nicotine (NICODERM CQ - DOSED IN MG/24 HOURS) 21 mg/24hr patch Place 1 patch (21 mg total) onto the skin daily. 28 patch 0   predniSONE (DELTASONE) 20 MG tablet Take 2 tablets (40 mg total) by mouth daily before breakfast. 30 tablet 1   No current facility-administered medications for this visit.    OBJECTIVE: There were no vitals filed for this visit.   There is no height or weight on file to calculate BMI.    ECOG FS:2 - Symptomatic, <50% confined to bed  Physical Exam Constitutional:      Appearance: Normal appearance.  HENT:     Head: Normocephalic and atraumatic.  Eyes:  Pupils: Pupils are equal, round, and reactive to light.  Cardiovascular:     Rate and Rhythm: Normal rate and regular rhythm.     Heart sounds: Normal heart sounds. No murmur heard. Pulmonary:     Effort: Pulmonary effort is normal.     Breath sounds: Normal breath sounds. No wheezing.  Abdominal:     General: Bowel sounds are normal. There is no distension.     Palpations: Abdomen is soft.     Tenderness: There is no abdominal tenderness.  Musculoskeletal:        General: Normal range of motion.     Cervical back: Normal range of motion.     Right Lower Extremity: Right leg is amputated below knee.  Skin:    General: Skin is warm and dry.     Findings: No rash.  Neurological:     Mental Status: She is alert and oriented to person, place, and time.  Psychiatric:        Judgment: Judgment normal.     LAB RESULTS:  Lab Results  Component Value Date   NA 139 09/12/2020   K 3.6 09/12/2020   CL 99 09/12/2020   CO2 30 09/12/2020   GLUCOSE 89 09/12/2020   BUN  14 09/12/2020   CREATININE 0.34 (L) 09/15/2020   CALCIUM 8.7 (L) 09/12/2020   PROT 7.3 08/28/2020   ALBUMIN 3.3 (L) 08/28/2020   AST 16 08/28/2020   ALT 11 08/28/2020   ALKPHOS 85 08/28/2020   BILITOT 0.3 08/28/2020   GFRNONAA >60 09/15/2020   GFRAA >60 03/25/2017    Lab Results  Component Value Date   WBC 19.8 (H) 09/15/2020   NEUTROABS 19.6 (H) 09/12/2020   HGB 9.6 (L) 09/15/2020   HCT 30.9 (L) 09/15/2020   MCV 77.3 (L) 09/15/2020   PLT 451 (H) 09/15/2020     STUDIES: DG Ankle Complete Right  Result Date: 08/28/2020 CLINICAL DATA:  Right ankle wound for 3 months, ulcerations, assess for osteomyelitis EXAM: RIGHT ANKLE - COMPLETE 3+ VIEW COMPARISON:  Radiograph 07/26/2020 FINDINGS: Persistent diffuse soft tissue swelling of the lower extremity with more focal skin thickening and ulcerative defect along the lateral malleolus and distal fibular diaphysis. There may be some early periosteal reaction which could reflect early features of periostitis or early developing osteomyelitis. Small ankle joint effusion. No acute bony abnormality. Specifically, no fracture, subluxation, or dislocation. Degenerative changes in the ankle and imaged portions of the foot. Posterior calcaneal spurring. IMPRESSION: Diffuse soft tissue swelling or more focal ulceration over the lateral malleolus and distal fibular diaphysis where some early periosteal reaction is suggested. May implicate reactive periostitis or early osteomyelitis. Electronically Signed   By: Kreg Shropshire M.D.   On: 08/28/2020 23:09   CT CHEST W CONTRAST  Result Date: 09/02/2020 CLINICAL DATA:  Pulmonary nodule. EXAM: CT CHEST WITH CONTRAST TECHNIQUE: Multidetector CT imaging of the chest was performed during intravenous contrast administration. CONTRAST:  75mL OMNIPAQUE IOHEXOL 300 MG/ML  SOLN COMPARISON:  06/05/2011 FINDINGS: Cardiovascular: The heart size is normal. No substantial pericardial effusion. No thoracic aortic aneurysm.  Mediastinum/Nodes: 14 mm short axis right paratracheal node is similar to prior suggesting benign/reactive etiology. Small prevascular lymph nodes are also stable. Small lymph nodes in the hilar regions and subcarinal station are similar to prior. The esophagus has normal imaging features. There is no axillary lymphadenopathy. Lungs/Pleura: Subpleural reticulation noted in both lungs with a basilar predominance. Small patchy areas of ground-glass opacity are seen in both lungs.  No dense focal airspace consolidation. No pleural effusion. Upper Abdomen: Unremarkable. Musculoskeletal: No worrisome lytic or sclerotic osseous abnormality. IMPRESSION: 1. Subpleural reticulation in both lungs with a basilar predominance. Small patchy areas of ground-glass opacity are seen in both lungs. Imaging features suggest underlying component of pulmonary fibrosis in UIP would be a consideration. Follow-up high-resolution chest CT recommended for more definitive evaluation. 2. Stable mild mediastinal and hilar lymphadenopathy, likely reactive. 3. Aortic Atherosclerosis (ICD10-I70.0). Electronically Signed   By: Kennith Center M.D.   On: 09/02/2020 15:09   CT ANGIO AO+BIFEM W & OR WO CONTRAST  Result Date: 09/07/2020 CLINICAL DATA:  50 year old female with a history pain and numbness right foot at 5 a.m. EXAM: CT ANGIOGRAPHY OF ABDOMINAL AORTA WITH ILIOFEMORAL RUNOFF TECHNIQUE: Multidetector CT imaging of the abdomen, pelvis and lower extremities was performed using the standard protocol during bolus administration of intravenous contrast. Multiplanar CT image reconstructions and MIPs were obtained to evaluate the vascular anatomy. CONTRAST:  OMNIPAQUE IOHEXOL 350 MG/ML SOLN COMPARISON:  None. FINDINGS: VASCULAR Aorta: Distal thoracic aorta without significant atherosclerotic changes. Diameter at the hiatus 19 mm. Mild calcified plaque at the level of the IMA origin. Pedunculated soft plaque/thrombus at the right aspect of  the infrarenal abdominal aorta, just above the bifurcation. Plaque extends 50% of the cross-sectional diameter of the aorta. Celiac: Patent, with no significant atherosclerotic changes. SMA: Patent, with no significant atherosclerotic changes. Renals: - Right: Right renal artery patent. - Left: Left renal artery patent. IMA: Inferior mesenteric artery is patent. Right lower extremity: Mild mixed calcified and soft plaque of the common iliac artery. No high-grade stenosis. 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 Hypogastric artery is patent. External iliac artery is patent with no significant atherosclerosis. Common femoral artery patent with no significant atherosclerosis. Profunda femoris is patent. There is an accessory thigh branch from the proximal SFA. SFA is patent from the origin through the popliteal artery with no significant atherosclerosis. Typical arrangement of the trifurcation. Anterior tibial artery patent from the origin to the distal third. There is absent contrast at the ankle within the anterior tibial artery. Tibioperoneal trunk is patent without significant atherosclerosis. Posterior tibial artery patent from the origin to the distal third. There is abrupt contrast cut off in the distal third. Peroneal artery patent from the origin to the distal third where there is abrupt contrast cut off distally. There is also a cut off of the anterior communicating artery from the peroneal artery. Left lower extremity: Mild mixed soft and calcified plaque of the common iliac artery. No high-grade stenosis or occlusion. No pedunculated plaque or ulcerated plaque. Hypogastric artery is patent. No significant atherosclerotic changes of the left external iliac artery. Common femoral artery patent. Closure device on the anterior wall of the left common femoral artery. Profunda femoris and the thigh branches patent. SFA is  patent from the origin through the popliteal artery. Typical trifurcation arrangement of the left tibial arteries. Anterior tibial artery is patent from the origin into the foot. Tibioperoneal trunk is patent. Posterior tibial artery is patent from the origin into the foot. Peroneal artery is patent from the origin into the foot. Veins: Unremarkable appearance of the venous system. Review of the MIP images confirms the above findings. NON-VASCULAR Lower chest: Redemonstration of subpleural reticular changes at the lung bases, which are likely extension weighted from the prior CT given the low lung volumes and respiratory motion. No  acute finding at the lung bases. Hepatobiliary: Unremarkable appearance of the liver. Unremarkable gall bladder. Pancreas: Unremarkable. Spleen: Unremarkable. Adrenals/Urinary Tract: - Right adrenal gland: Unremarkable - Left adrenal gland: Unremarkable. - Right kidney: No hydronephrosis, nephrolithiasis, inflammation, or ureteral dilation. No focal lesion. - Left Kidney: No hydronephrosis, nephrolithiasis, inflammation, or ureteral dilation. No focal lesion. - Urinary Bladder: Unremarkable. Stomach/Bowel: - Stomach: Unremarkable. - Small bowel: Unremarkable - Appendix: Normal. - Colon: Moderate stool burden. No evidence of obstruction. No inflammatory changes. Minimal diverticular disease of the sigmoid colon. Lymphatic: No adenopathy. Mesenteric: No free fluid or air. No mesenteric adenopathy. Reproductive: Hysterectomy Other: Subcutaneous gas in the right lower quadrant into separate location, likely injection sequela. Small fat containing umbilical hernia. Musculoskeletal: Mild degenerative changes of the spine. No acute displaced fracture. Mild degenerative changes of the hips. IMPRESSION: Irregular, pedunculated aortic plaque/thrombus of the lower infrarenal abdominal aorta, potentially a source of embolus. The plaque/thrombus contributes to 50% cross-sectional diameter narrowing of  the lower infrarenal abdominal aorta. Aortic Atherosclerosis (ICD10-I70.0). Minimal bilateral iliac arterial disease. No significant femoropopliteal disease. There is absent contrast column within the distal right AT, PT, and peroneal arteries, just above the ankle, favored to represent distal embolization, however, slow arrival of the contrast bolus could have this appearance. The above preliminary results were discussed by telephone at the time of interpretation on 09/07/2020 at 9:29 am with Dr. Myra Gianotti. Ancillary findings as above. Signed, Yvone Neu. Reyne Dumas, RPVI [facility] Electronically Signed   By: Gilmer Mor D.O.   On: 09/07/2020 09:31   PERIPHERAL VASCULAR CATHETERIZATION  Result Date: 09/07/2020 Images from the original result were not included. Patient name: JUDYE LORINO MRN: [id number] DOB: 04-Feb-1971 Sex: female 09/07/2020 Pre-operative Diagnosis: Ischemic right foot Post-operative diagnosis:  Same Surgeon:  Durene Cal Procedure Performed:  1.  Antegrade ultrasound-guided access, right femoral artery  2.  Right lower extremity runoff  3.  Intra-arterial injection of tPA  4.  Initiation of thrombolytic therapy  5.  Conscious sedation, 27 minutes  \ Indications: This is a 50 year old female with nonhealing right leg ulcer who has been diagnosed with vasculitis.  She underwent angiography a week ago that showed poor perfusion onto her foot.  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  Yesterday she developed acute changes to her foot including pain and numbness.  She did have motor function.  Because this was felt to be small vessel disease, I tried IV heparin, because she did have Doppler signals down to her ulcer, but not onto her foot.  I also got a CT angiogram.  This revealed a aortic thrombus.  The patient did not improve overnight with IV heparin.  I discussed surgical thrombectomy versus thrombolytic therapy.   Because surgical thrombectomy would not address her microcirculation, I felt that initiation of lytic therapy provided the best chance for limb salvage.  She does have an aortic thrombus.  I felt that this was at high risk for being dislodged if I obtain contralateral access.  Therefore I discussed getting antegrade access.  I told her that we would need to address her aortic thrombus in the near future.  The last thing I wanted to do was to dislodge her thrombus and commit her to a surgical thrombectomy which would make thrombolytic therapy not an option. Procedure:  The patient was identified in the holding area and taken to room 8.  The patient was then placed supine on the table and  prepped and draped in the usual sterile fashion.  A time out was called.  Conscious sedation was administered with the use of IV fentanyl and Versed under continuous physician and nurse monitoring.  Heart rate, blood pressure, and oxygen saturation were continuously monitored.  Total sedation time was 27 minutes.  Ultrasound was used to evaluate the right common femoral artery.  It was patent .  A digital ultrasound image was acquired.  A micropuncture needle was used to access the right common femoral artery under ultrasound guidance in antegrade fashion.  A 018 wire was inserted followed by micropuncture sheath.  A contrast injection was performed and then I manipulated the wire into the superficial femoral artery.  The sheath was then inserted into the superficial femoral artery.  A 035 wire was then placed followed by a 5 [place] sheath.  Contrast injections were then performed Findings:  Right Lower Extremity: The common femoral profundofemoral, superficial femoral, and popliteal artery were widely patent.  There did appear to be some spasm within the popliteal artery from the wire.  All 3 tibial vessels were patent down to the lower leg where they occluded.  There was minimal opacification of the vessels across the ankle onto  the foot. Intervention: After the images were acquired, I exchanged the 5 French sheath for a 6 French 45 cm sheath.  A 20 cm infusion length catheter was then placed.  10 mg of tPA was then administered and a pulse spray fashion.  Next, the tPA infusion was initiated through the infusion catheter, and heparin was 1 through the sheath.  This sheath was sutured into position.  The patient be taken to the ICU for overnight tPA administration Impression:  #1  Initiation of thrombolytic therapy  #2  Minimal opacification of the tibial vessels beginning at the ankle  #3  No significant atherosclerotic changes noted within the common femoral profundofemoral, superficial femoral, popliteal artery, and proximal tibial vessels V. Durene Cal, M.D., [facility] [place] Office: [phone] Pager:  [phone]   PERIPHERAL VASCULAR CATHETERIZATION  Result Date: 09/01/2020 See surgical note for result.  US Venous Img Lower Unilateral Right (DVT)  Result Date: 08/29/2020 CLINICAL DATA:  Right leg pain and swelling EXAM: RIGHT LOWER EXTREMITY VENOUS DOPPLER ULTRASOUND TECHNIQUE: Gray-scale sonography with graded compression, as well as color Doppler and duplex ultrasound were performed to evaluate the lower extremity deep venous systems from the level of the common femoral vein and including the common femoral, femoral, profunda femoral, popliteal and calf veins including the posterior tibial, peroneal and gastrocnemius veins when visible. The superficial great saphenous vein was also interrogated. Spectral Doppler was utilized to evaluate flow at rest and with distal augmentation maneuvers in the common femoral, femoral and popliteal veins. COMPARISON:  None. FINDINGS: Contralateral Common Femoral Vein: Respiratory phasicity is normal and symmetric with the symptomatic side. No evidence of thrombus. Normal compressibility. Common Femoral Vein: No evidence of thrombus. Normal  compressibility, respiratory phasicity and response to augmentation. Saphenofemoral Junction: No evidence of thrombus. Normal compressibility and flow on color Doppler imaging. Profunda Femoral Vein: No evidence of thrombus. Normal compressibility and flow on color Doppler imaging. Femoral Vein: No evidence of thrombus. Normal compressibility, respiratory phasicity and response to augmentation. Popliteal Vein: No evidence of thrombus. Normal compressibility, respiratory phasicity and response to augmentation. Calf Veins: No evidence of thrombus. Normal compressibility and flow on color Doppler imaging. Other Findings: Right popliteal fossa Baker's cyst measures 3.2 x 1.0 x 1.4 cm IMPRESSION:  Negative for right lower extremity DVT. 3.2 cm right Baker's cyst. Electronically Signed   By: Judie Petit.  Shick M.D.   On: 08/29/2020 14:46   DG Chest Port 1 View  Result Date: 08/28/2020 CLINICAL DATA:  Sepsis, lower extremity wounds EXAM: PORTABLE CHEST 1 VIEW COMPARISON:  12/20/2019 radiograph, 06/05/2011 CT FINDINGS: Low lung volumes. Patchy and reticular opacities are present in the mid to lower lungs with associated pulmonary vascular congestion, fissural thickening and peribronchial cuffing. Chronic reticular changes are noted on comparison priors as well. No pneumothorax or visible effusion. Stable cardiomediastinal contours. No acute osseous or soft tissue abnormality. IMPRESSION: Coarse reticular and patchy opacities in the lung bases favor superimposed edematous change given vascular congestion, septal thickening and cuffing. Underlying infection is difficult to fully exclude. Low volumes with additional features of atelectasis. Electronically Signed   By: Kreg Shropshire M.D.   On: 08/28/2020 23:11   DG C-Arm 1-60 Min-No Report  Result Date: 09/08/2020 Fluoroscopy was utilized by the requesting physician.  No radiographic interpretation.   ECHOCARDIOGRAM COMPLETE  Result Date: 09/10/2020    ECHOCARDIOGRAM REPORT    Patient Name:   LYDIA TOREN Date of Exam: 09/09/2020 Medical Rec #:  [id number]       Height:       63.0 in Accession #:    [id number]      Weight:       125.7 lb Date of Birth:  1970-09-03       BSA:          1.587 m Patient Age:    50 years        BP:           108/62 mmHg Patient Gender: F               HR:           68 bpm. Exam Location:  [facility] Procedure: 2D Echo, Cardiac Doppler and Color Doppler Indications:     Ischemic foot  History:         Patient has no prior history of Echocardiogram examinations.  Sonographer:     Sedonia Small Rodgers-Jones Referring Phys:  [id number] Love[address] Newcomer THOMPSON Diagnosing Phys: Alwyn Pea MD IMPRESSIONS  1. Left ventricular ejection fraction, by estimation, is 60 to 65%. Left ventricular ejection fraction by PLAX is 64 %. The left ventricle has normal function. The left ventricle has no regional wall motion abnormalities. Left ventricular diastolic parameters were normal.  2. Right ventricular systolic function is normal. The right ventricular size is normal.  3. The mitral valve is normal in structure. No evidence of mitral valve regurgitation.  4. The aortic valve is normal in structure. Aortic valve regurgitation is not visualized. FINDINGS  Left Ventricle: Left ventricular ejection fraction, by estimation, is 60 to 65%. Left ventricular ejection fraction by PLAX is 64 %. The left ventricle has normal function. The left ventricle has no regional wall motion abnormalities. The left ventricular internal cavity size was normal in size. There is no left ventricular hypertrophy. Left ventricular diastolic parameters were normal. Right Ventricle: The right ventricular size is normal. No increase in right ventricular wall thickness. Right ventricular systolic function is normal. Left Atrium: Left atrial size was normal in size. Right Atrium: Right atrial size was normal in size. Pericardium: There is no evidence of pericardial effusion. Mitral Valve: The mitral valve is normal in  structure. No evidence of mitral valve regurgitation. Tricuspid Valve: The tricuspid valve is normal in structure.  Tricuspid valve regurgitation is trivial. Aortic Valve: The aortic valve is normal in structure. Aortic valve regurgitation is not visualized. Pulmonic Valve: The pulmonic valve was normal in structure. Pulmonic valve regurgitation is not visualized. Aorta: The ascending aorta was not well visualized. IAS/Shunts: No atrial level shunt detected by color flow Doppler.  LEFT VENTRICLE PLAX 2D LV EF:         Left            Diastology                ventricular     LV e' medial:    7.18 cm/s                ejection        LV E/e' medial:  15.6                fraction by     LV e' lateral:   9.90 cm/s                PLAX is 64      LV E/e' lateral: 11.3                %. LVIDd:         4.30 cm LVIDs:         2.80 cm LV PW:         0.70 cm LV IVS:        0.60 cm LVOT diam:     1.90 cm LV SV:         60 LV SV Index:   38 LVOT Area:     2.84 cm  RIGHT VENTRICLE             IVC RV Basal diam:  3.30 cm     IVC diam: 1.70 cm RV S prime:     20.90 cm/s TAPSE (M-mode): 2.5 cm LEFT ATRIUM             Index       RIGHT ATRIUM           Index LA diam:        3.80 cm 2.39 cm/m  RA Area:     10.00 cm LA Vol (A2C):   39.0 ml 24.57 ml/m RA Volume:   20.90 ml  13.17 ml/m LA Vol (A4C):   37.5 ml 23.63 ml/m LA Biplane Vol: 39.0 ml 24.57 ml/m  AORTIC VALVE LVOT Vmax:   93.80 cm/s LVOT Vmean:  65.500 cm/s LVOT VTI:    0.213 m  AORTA Ao Root diam: 3.10 cm Ao Asc diam:  3.20 cm MITRAL VALVE MV Area (PHT): 4.39 cm     SHUNTS MV Decel Time: 173 msec     Systemic VTI:  0.21 m MV E velocity: 112.00 cm/s  Systemic Diam: 1.90 cm MV A velocity: 97.90 cm/s MV E/A ratio:  1.14 Dwayne D Callwood MD Electronically signed by Alwyn Pea MD Signature Date/Time: 09/10/2020/2:06:07 PM    Final     ASSESSMENT: Mrs. 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  She received  her first dose while inpatient on 09/05/2020.  PLAN:    P-ANCA vasculitis: Presented 08/28/2020 for pain and worsening drainage and odor to right lower extremity ulcer. Work-up was negative for blood clot but showed poor flow in right foot and small vessels to foot and ankle.   Wound was cultured and found  to have an abundant of MRSA and she was started on IV antibiotics. Rheumatology was consulted who recommended IV steroids and 2 doses of Rituxan given 14 days apart. Her first dose of Rituxan was on 09/05/2020. 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  Today she returns for her second Rituxan infusion. She is calling Dr. Driscilla Grammesew's office today to schedule follow-up appointment.  Has concerns about pain in her left great toe.  Aortic thrombus: Incidentally discovered on CT angio of abdominal aorta. She was started on IV heparin and transition to Eliquis. Had hypercoagulable work-up while inpatient which was negative. Underwent mechanical thrombectomy of the aorta and right common iliac artery with stent placement by Dr. Wyn Quakerew on 09/08/2020. Appears to be tolerating Eliquis well.  Iron deficiency anemia: Slowly trending down since the beginning of the month. Labs from 09/19/2020 show hemoglobin of 8.6 with MCV 78.5. Ferritin level on 07/26/2020 was 9 with an iron saturation of 5%. She is currently on oral iron and tolerating well. Recommend 5 doses of IV Venofer starting next week.  Disposition: IV Rituxan today. RTC in 1 week for IV Venofer x5 doses. RTC in 2 months for follow-up with Dr. Orlie DakinFinnegan and possible IV iron.  Greater than 50% was spent in counseling and coordination of care with this patient including but not limited to discussion of the relevant topics above (See A&P) including, but not limited to diagnosis and management of acute and chronic medical conditions.   Patient expressed understanding and was in agreement with this plan.  She also understands that She can call clinic at any time with any questions, concerns, or complaints.   Cancer Staging No matching staging information was found for the patient.  Mauro KaufmannJennifer E Tifani Dack, NP   09/19/2020 8:09 AM

## 2020-09-22 ENCOUNTER — Encounter: Payer: Self-pay | Admitting: Oncology

## 2020-09-23 ENCOUNTER — Encounter: Payer: Self-pay | Admitting: Oncology

## 2020-09-25 LAB — MISC LABCORP TEST (SEND OUT)
LabCorp test name: 50707
Labcorp test code: 9985

## 2020-09-26 ENCOUNTER — Other Ambulatory Visit: Payer: Self-pay

## 2020-09-26 ENCOUNTER — Encounter: Payer: Self-pay | Admitting: Oncology

## 2020-09-26 ENCOUNTER — Ambulatory Visit (INDEPENDENT_AMBULATORY_CARE_PROVIDER_SITE_OTHER): Payer: Medicaid Other | Admitting: Vascular Surgery

## 2020-09-26 ENCOUNTER — Telehealth (INDEPENDENT_AMBULATORY_CARE_PROVIDER_SITE_OTHER): Payer: Self-pay | Admitting: Vascular Surgery

## 2020-09-26 VITALS — BP 92/60 | HR 103 | Ht 63.0 in | Wt 130.0 lb

## 2020-09-26 DIAGNOSIS — I7410 Embolism and thrombosis of unspecified parts of aorta: Secondary | ICD-10-CM

## 2020-09-26 DIAGNOSIS — I70222 Atherosclerosis of native arteries of extremities with rest pain, left leg: Secondary | ICD-10-CM

## 2020-09-26 DIAGNOSIS — I70229 Atherosclerosis of native arteries of extremities with rest pain, unspecified extremity: Secondary | ICD-10-CM | POA: Insufficient documentation

## 2020-09-26 NOTE — Assessment & Plan Note (Signed)
Status post BKA on the right.  Now with symptoms concerning for rest pain on the left.  Noninvasive studies in the near future

## 2020-09-26 NOTE — Assessment & Plan Note (Signed)
Status post thrombectomy with bilateral kissing iliac stent placements in the distal aorta.  At this point, she is on Eliquis but there is concerned that there is now some left leg ischemia.  Noninvasive studies in the near future.

## 2020-09-26 NOTE — Telephone Encounter (Signed)
LVM to callback to be scheduled. Patient was seen today as new patient.  (Asap per jd) Aorta iliac. Abi. See jd/fb

## 2020-09-26 NOTE — Progress Notes (Signed)
Patient ID: Alexandra [name], female   DOB: 1971-01-24, 50 y.o.   MRN: [id number]  Chief Complaint  Patient presents with   Follow-up    2wk post op Above the knee amputation     HPI CAROLANN [name] is a 50 y.o. female.  Patient returns in follow-up 2 weeks after right below-knee amputation.  Few days prior to that, she underwent aorta and right iliac thrombectomy for thrombosis with kissing stent placement to both common iliac arteries.  Her amputation site is actually healing very well.  The pain is much better on that right side.  Her biggest problem now, as her right foot and toes are hurting.  Her first and second toe are somewhat discolored.  She is having pain that makes her dangle her foot concerning for rest pain.  The foot is also a little bit swollen on the left side.   Past Medical History:  Diagnosis Date   Breast discharge 2013   present X 3 years  Left only multiple ducts per pt   Depression     Past Surgical History:  Procedure Laterality Date   ABDOMINAL HYSTERECTOMY     AMPUTATION Right 09/12/2020   Procedure: AMPUTATION BELOW KNEE;  Surgeon: Annice Needy, MD;  Location: [facility];  Service: Vascular;  Laterality: Right;   BREAST CYST EXCISION Left 2006   ENDOVASCULAR STENT INSERTION N/A 09/08/2020   Procedure: ENDOVASCULAR STENT GRAFT INSERTION;  Surgeon: Annice Needy, MD;  Location: [facility];  Service: Vascular;  Laterality: N/A;   LOWER EXTREMITY ANGIOGRAPHY Right 09/01/2020   Procedure: Lower Extremity Angiography;  Surgeon: Annice Needy, MD;  Location: [facility];  Service: Cardiovascular;  Laterality: Right;   LOWER EXTREMITY INTERVENTION Right 09/07/2020   Procedure: LOWER EXTREMITY INTERVENTION;  Surgeon: Nada Libman, MD;  Location: [facility];  Service: Cardiovascular;  Laterality: Right;      No Known Allergies  Current Outpatient Medications  Medication Sig Dispense Refill   apixaban (ELIQUIS) 5 MG TABS tablet Take 1 tablet (5  mg total) by mouth 2 (two) times daily. 60 tablet 4   atorvastatin (LIPITOR) 40 MG tablet Take 1 tablet (40 mg total) by mouth once daily. 30 tablet 4   cholecalciferol (VITAMIN D) 25 MCG (1000 UNIT) tablet Take 1,000 Units by mouth daily. For Vitamin D maintenance after completion of Vitamin D treatment.     citalopram (CELEXA) 40 MG tablet Take 1 tablet (40 mg total) by mouth once daily. 30 tablet 2   ferrous gluconate (FERGON) 324 MG tablet Take 1 tablet (324 mg total) by mouth 2 (two) times daily with a meal. 60 tablet 3   gabapentin (NEURONTIN) 100 MG capsule SMARTSIG:3 By Mouth Every 8 Hours PRN     gabapentin (NEURONTIN) 300 MG capsule Take 300 mg by mouth 3 (three) times daily.     HYDROcodone-acetaminophen (NORCO/VICODIN) 5-325 MG tablet Take 1-2 tablets by mouth every 6 (six) hours as needed for severe pain or moderate pain. 30 tablet 0   HYDROmorphone (DILAUDID) 2 MG tablet Take 1 tablet (2 mg total) by mouth every 6 (six) hours as needed for severe pain. 20 tablet 0   IRON 100/C 100-250 MG TABS SMARTSIG:1 Tablet(s) By Mouth Every 12 Hours     nicotine (NICODERM CQ - DOSED IN MG/24 HOURS) 21 mg/24hr patch Place 1 patch (21 mg total) onto the skin daily. 28 patch 0   predniSONE (DELTASONE) 20 MG tablet Take  2 tablets (40 mg total) by mouth daily before breakfast. 30 tablet 1   No current facility-administered medications for this visit.        Physical Exam BP 92/60   Pulse (!) 103   Ht 5\' 3"  (1.6 m)   Wt 130 lb (59 kg)   LMP 01/14/2009   BMI 23.03 kg/m  Gen:  WD/WN, NAD Skin: incision C/D/I     Assessment/Plan:  Aortic thrombus (HCC) Status post thrombectomy with bilateral kissing iliac stent placements in the distal aorta.  At this point, she is on Eliquis but there is concerned that there is now some left leg ischemia.  Noninvasive studies in the near future.  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HCC) Status post BKA on the right.  Now  with symptoms concerning for rest pain on the left.  Noninvasive studies in the near future      13/04/2008 09/26/2020, 12:35 PM   This note was created with Dragon medical transcription system.  Any errors from dictation are unintentional.

## 2020-09-29 ENCOUNTER — Ambulatory Visit (INDEPENDENT_AMBULATORY_CARE_PROVIDER_SITE_OTHER): Payer: Medicaid Other

## 2020-09-29 ENCOUNTER — Other Ambulatory Visit: Payer: Self-pay

## 2020-09-29 ENCOUNTER — Ambulatory Visit (INDEPENDENT_AMBULATORY_CARE_PROVIDER_SITE_OTHER): Payer: Medicaid Other | Admitting: Nurse Practitioner

## 2020-09-29 ENCOUNTER — Telehealth (INDEPENDENT_AMBULATORY_CARE_PROVIDER_SITE_OTHER): Payer: Self-pay

## 2020-09-29 VITALS — BP 100/66 | HR 86 | Ht 62.0 in | Wt 129.0 lb

## 2020-09-29 DIAGNOSIS — I75022 Atheroembolism of left lower extremity: Secondary | ICD-10-CM

## 2020-09-29 DIAGNOSIS — I70222 Atherosclerosis of native arteries of extremities with rest pain, left leg: Secondary | ICD-10-CM

## 2020-09-29 DIAGNOSIS — I7410 Embolism and thrombosis of unspecified parts of aorta: Secondary | ICD-10-CM | POA: Diagnosis not present

## 2020-09-29 DIAGNOSIS — F17200 Nicotine dependence, unspecified, uncomplicated: Secondary | ICD-10-CM | POA: Diagnosis not present

## 2020-09-29 MED ORDER — PREDNISONE 20 MG PO TABS: 40.0000 mg | ORAL_TABLET | Freq: Every day | ORAL | 0 refills | Status: DC

## 2020-09-29 MED ORDER — APIXABAN 5 MG PO TABS: 5.0000 mg | ORAL_TABLET | Freq: Two times a day (BID) | ORAL | 4 refills | Status: AC

## 2020-09-29 NOTE — Telephone Encounter (Signed)
Patient was seen today and scheduled with DR. Dew for a LLE angio on 10/01/20 with a 9;30 am arrival time to the MM. Pre-procedure instructions were discussed and handed to patient.

## 2020-10-01 ENCOUNTER — Other Ambulatory Visit (INDEPENDENT_AMBULATORY_CARE_PROVIDER_SITE_OTHER): Payer: Self-pay | Admitting: Nurse Practitioner

## 2020-10-01 ENCOUNTER — Observation Stay
Admission: RE | Admit: 2020-10-01 | Discharge: 2020-10-02 | Disposition: A | Discharge status: Home / Self Care | Payer: Medicaid Other | Attending: Vascular Surgery | Admitting: Vascular Surgery

## 2020-10-01 ENCOUNTER — Encounter
Admission: RE | Disposition: A | Discharge status: Home / Self Care | Payer: Self-pay | Source: Home / Self Care | Attending: Vascular Surgery

## 2020-10-01 ENCOUNTER — Other Ambulatory Visit: Payer: Self-pay

## 2020-10-01 ENCOUNTER — Encounter: Payer: Self-pay | Admitting: Vascular Surgery

## 2020-10-01 ENCOUNTER — Encounter (INDEPENDENT_AMBULATORY_CARE_PROVIDER_SITE_OTHER): Payer: Self-pay | Admitting: Nurse Practitioner

## 2020-10-01 DIAGNOSIS — F17210 Nicotine dependence, cigarettes, uncomplicated: Secondary | ICD-10-CM | POA: Diagnosis not present

## 2020-10-01 DIAGNOSIS — I70222 Atherosclerosis of native arteries of extremities with rest pain, left leg: Secondary | ICD-10-CM

## 2020-10-01 DIAGNOSIS — Z79899 Other long term (current) drug therapy: Secondary | ICD-10-CM | POA: Insufficient documentation

## 2020-10-01 DIAGNOSIS — Z89511 Acquired absence of right leg below knee: Secondary | ICD-10-CM | POA: Insufficient documentation

## 2020-10-01 DIAGNOSIS — Z7982 Long term (current) use of aspirin: Secondary | ICD-10-CM | POA: Insufficient documentation

## 2020-10-01 DIAGNOSIS — Z7901 Long term (current) use of anticoagulants: Secondary | ICD-10-CM | POA: Diagnosis not present

## 2020-10-01 DIAGNOSIS — I75022 Atheroembolism of left lower extremity: Secondary | ICD-10-CM | POA: Insufficient documentation

## 2020-10-01 DIAGNOSIS — I998 Other disorder of circulatory system: Secondary | ICD-10-CM | POA: Diagnosis present

## 2020-10-01 DIAGNOSIS — I7409 Other arterial embolism and thrombosis of abdominal aorta: Secondary | ICD-10-CM | POA: Insufficient documentation

## 2020-10-01 HISTORY — PX: LOWER EXTREMITY ANGIOGRAPHY: CATH118251

## 2020-10-01 LAB — COMPREHENSIVE METABOLIC PANEL
ALT: 25 U/L (ref 0–44)
AST: 23 U/L (ref 15–41)
Albumin: 3.3 g/dL — ABNORMAL LOW (ref 3.5–5.0)
Alkaline Phosphatase: 75 U/L (ref 38–126)
Anion gap: 11 (ref 5–15)
BUN: 21 mg/dL — ABNORMAL HIGH (ref 6–20)
CO2: 27 mmol/L (ref 22–32)
Calcium: 8.7 mg/dL — ABNORMAL LOW (ref 8.9–10.3)
Chloride: 99 mmol/L (ref 98–111)
Creatinine, Ser: 0.47 mg/dL (ref 0.44–1.00)
GFR, Estimated: >60 mL/min (ref >60)
Glucose, Bld: 117 mg/dL — ABNORMAL HIGH (ref 70–99)
Potassium: 4.5 mmol/L (ref 3.5–5.1)
Sodium: 137 mmol/L (ref 135–145)
Total Bilirubin: 0.8 mg/dL (ref 0.3–1.2)
Total Protein: 6 g/dL — ABNORMAL LOW (ref 6.5–8.1)

## 2020-10-01 LAB — CREATININE, SERUM
Creatinine, Ser: 0.47 mg/dL (ref 0.44–1.00)
GFR, Estimated: >60 mL/min (ref >60)

## 2020-10-01 LAB — CBC
HCT: 31.4 % — ABNORMAL LOW (ref 36.0–46.0)
Hemoglobin: 9.8 g/dL — ABNORMAL LOW (ref 12.0–15.0)
MCH: 26.2 pg (ref 26.0–34.0)
MCHC: 31.2 g/dL (ref 30.0–36.0)
MCV: 84 fL (ref 80.0–100.0)
Platelets: 382 10*3/uL (ref 150–400)
RBC: 3.74 MIL/uL — ABNORMAL LOW (ref 3.87–5.11)
RDW: 23.8 % — ABNORMAL HIGH (ref 11.5–15.5)
WBC: 16.2 10*3/uL — ABNORMAL HIGH (ref 4.0–10.5)
nRBC: 0 % (ref 0.0–0.2)

## 2020-10-01 LAB — PROTIME-INR
INR: 1.1 (ref 0.8–1.2)
Prothrombin Time: 14.6 seconds (ref 11.4–15.2)

## 2020-10-01 LAB — BUN: BUN: 19 mg/dL (ref 6–20)

## 2020-10-01 SURGERY — LOWER EXTREMITY ANGIOGRAPHY
Anesthesia: Moderate Sedation | Site: Leg Lower | Laterality: Left

## 2020-10-01 MED ORDER — ATORVASTATIN CALCIUM 20 MG PO TABS
40.0000 mg | ORAL_TABLET | Freq: Every day | ORAL | Status: DC
  Administered 2020-10-02: 40 mg via ORAL
  Filled 2020-10-01: qty 2

## 2020-10-01 MED ORDER — IODIXANOL 320 MG/ML IV SOLN
INTRAVENOUS | Status: DC | PRN
  Administered 2020-10-01: 65 mL

## 2020-10-01 MED ORDER — HYDROMORPHONE HCL 1 MG/ML IJ SOLN
INTRAMUSCULAR | Status: AC
  Administered 2020-10-01: 0.5 mg via INTRAVENOUS
  Filled 2020-10-01: qty 0.5

## 2020-10-01 MED ORDER — GABAPENTIN 300 MG PO CAPS
300.0000 mg | ORAL_CAPSULE | Freq: Three times a day (TID) | ORAL | Status: DC
  Administered 2020-10-01 – 2020-10-02 (×4): 300 mg via ORAL
  Filled 2020-10-01 (×4): qty 1

## 2020-10-01 MED ORDER — PHENOL 1.4 % MT LIQD
1.0000 | OROMUCOSAL | Status: DC | PRN
  Filled 2020-10-01: qty 177

## 2020-10-01 MED ORDER — HYDRALAZINE HCL 20 MG/ML IJ SOLN: 5.0000 mg | INTRAMUSCULAR | Status: DC | PRN

## 2020-10-01 MED ORDER — MIDAZOLAM HCL 2 MG/ML PO SYRP: 8.0000 mg | ORAL_SOLUTION | Freq: Once | ORAL | Status: DC | PRN

## 2020-10-01 MED ORDER — PANTOPRAZOLE SODIUM 40 MG PO TBEC
40.0000 mg | DELAYED_RELEASE_TABLET | Freq: Every day | ORAL | Status: DC
  Administered 2020-10-01 – 2020-10-02 (×2): 40 mg via ORAL
  Filled 2020-10-01 (×2): qty 1

## 2020-10-01 MED ORDER — METOPROLOL TARTRATE 5 MG/5ML IV SOLN: 2.0000 mg | INTRAVENOUS | Status: DC | PRN

## 2020-10-01 MED ORDER — MIDAZOLAM HCL 2 MG/2ML IJ SOLN
INTRAMUSCULAR | Status: DC | PRN
  Administered 2020-10-01 (×2): 1 mg via INTRAVENOUS
  Administered 2020-10-01: 2 mg via INTRAVENOUS
  Administered 2020-10-01 (×2): 1 mg via INTRAVENOUS

## 2020-10-01 MED ORDER — MELATONIN 5 MG PO TABS
5.0000 mg | ORAL_TABLET | Freq: Every day | ORAL | Status: DC
  Administered 2020-10-02: 5 mg via ORAL
  Filled 2020-10-01: qty 1

## 2020-10-01 MED ORDER — TIROFIBAN HCL IN NACL 5-0.9 MG/100ML-% IV SOLN: INTRAVENOUS | Status: AC | PRN

## 2020-10-01 MED ORDER — VITAMIN D3 25 MCG (1000 UNIT) PO TABS
1000.0000 [IU] | ORAL_TABLET | Freq: Every day | ORAL | Status: DC
  Administered 2020-10-02: 1000 [IU] via ORAL
  Filled 2020-10-01 (×2): qty 1

## 2020-10-01 MED ORDER — FENTANYL CITRATE (PF) 100 MCG/2ML IJ SOLN
INTRAMUSCULAR | Status: AC
  Filled 2020-10-01: qty 2

## 2020-10-01 MED ORDER — HEPARIN SODIUM (PORCINE) 1000 UNIT/ML IJ SOLN
INTRAMUSCULAR | Status: DC | PRN
  Administered 2020-10-01: 2000 [IU] via INTRAVENOUS
  Administered 2020-10-01: 5000 [IU] via INTRAVENOUS

## 2020-10-01 MED ORDER — LABETALOL HCL 5 MG/ML IV SOLN: 10.0000 mg | INTRAVENOUS | Status: DC | PRN

## 2020-10-01 MED ORDER — GUAIFENESIN-DM 100-10 MG/5ML PO SYRP: 15.0000 mL | ORAL_SOLUTION | ORAL | Status: DC | PRN

## 2020-10-01 MED ORDER — HYDROCODONE-ACETAMINOPHEN 5-325 MG PO TABS
1.0000 | ORAL_TABLET | Freq: Four times a day (QID) | ORAL | Status: DC | PRN
  Administered 2020-10-01 – 2020-10-02 (×3): 2 via ORAL
  Filled 2020-10-01: qty 1
  Filled 2020-10-01 (×2): qty 2
  Filled 2020-10-01: qty 1

## 2020-10-01 MED ORDER — HEPARIN SODIUM (PORCINE) 1000 UNIT/ML IJ SOLN
INTRAMUSCULAR | Status: AC
  Filled 2020-10-01: qty 1

## 2020-10-01 MED ORDER — MIDAZOLAM HCL 5 MG/5ML IJ SOLN
INTRAMUSCULAR | Status: AC
  Filled 2020-10-01: qty 5

## 2020-10-01 MED ORDER — METHYLPREDNISOLONE SODIUM SUCC 125 MG IJ SOLR: 125.0000 mg | Freq: Once | INTRAMUSCULAR | Status: DC | PRN

## 2020-10-01 MED ORDER — FAMOTIDINE 20 MG PO TABS: 40.0000 mg | ORAL_TABLET | Freq: Once | ORAL | Status: DC | PRN

## 2020-10-01 MED ORDER — ALTEPLASE 2 MG IJ SOLR
INTRAMUSCULAR | Status: DC | PRN
  Administered 2020-10-01: 4 mg

## 2020-10-01 MED ORDER — HYDROMORPHONE HCL 1 MG/ML IJ SOLN
1.0000 mg | Freq: Once | INTRAMUSCULAR | Status: AC | PRN
  Administered 2020-10-01: 0.5 mg via INTRAVENOUS

## 2020-10-01 MED ORDER — FENTANYL CITRATE (PF) 100 MCG/2ML IJ SOLN
INTRAMUSCULAR | Status: DC | PRN
  Administered 2020-10-01 (×2): 25 ug via INTRAVENOUS
  Administered 2020-10-01 (×2): 50 ug via INTRAVENOUS

## 2020-10-01 MED ORDER — FERROUS GLUCONATE 324 (38 FE) MG PO TABS
324.0000 mg | ORAL_TABLET | Freq: Two times a day (BID) | ORAL | Status: DC
  Administered 2020-10-02 (×2): 324 mg via ORAL
  Filled 2020-10-01 (×5): qty 1

## 2020-10-01 MED ORDER — ALUM & MAG HYDROXIDE-SIMETH 200-200-20 MG/5ML PO SUSP: 15.0000 mL | ORAL | Status: DC | PRN

## 2020-10-01 MED ORDER — ONDANSETRON HCL 4 MG/2ML IJ SOLN: 4.0000 mg | Freq: Four times a day (QID) | INTRAMUSCULAR | Status: DC | PRN

## 2020-10-01 MED ORDER — POTASSIUM CHLORIDE CRYS ER 20 MEQ PO TBCR
20.0000 meq | EXTENDED_RELEASE_TABLET | Freq: Once | ORAL | Status: AC
  Administered 2020-10-01: 20 meq via ORAL
  Filled 2020-10-01: qty 1

## 2020-10-01 MED ORDER — MORPHINE SULFATE (PF) 4 MG/ML IV SOLN: 2.0000 mg | INTRAVENOUS | Status: DC | PRN

## 2020-10-01 MED ORDER — ALTEPLASE 2 MG IJ SOLR
INTRAMUSCULAR | Status: AC
  Filled 2020-10-01: qty 4

## 2020-10-01 MED ORDER — HYDROMORPHONE HCL 1 MG/ML IJ SOLN
INTRAMUSCULAR | Status: AC
  Filled 2020-10-01: qty 0.5

## 2020-10-01 MED ORDER — CITALOPRAM HYDROBROMIDE 20 MG PO TABS
40.0000 mg | ORAL_TABLET | Freq: Every day | ORAL | Status: DC
  Administered 2020-10-02: 40 mg via ORAL
  Filled 2020-10-01: qty 2

## 2020-10-01 MED ORDER — CEFAZOLIN SODIUM-DEXTROSE 2-4 GM/100ML-% IV SOLN
2.0000 g | Freq: Once | INTRAVENOUS | Status: AC
  Administered 2020-10-01: 2 g via INTRAVENOUS

## 2020-10-01 MED ORDER — DIPHENHYDRAMINE HCL 50 MG/ML IJ SOLN: 50.0000 mg | Freq: Once | INTRAMUSCULAR | Status: DC | PRN

## 2020-10-01 MED ORDER — TIROFIBAN HCL IN NACL 5-0.9 MG/100ML-% IV SOLN
INTRAVENOUS | Status: AC
  Filled 2020-10-01: qty 100

## 2020-10-01 MED ORDER — APIXABAN 5 MG PO TABS
5.0000 mg | ORAL_TABLET | Freq: Two times a day (BID) | ORAL | Status: DC
  Administered 2020-10-02: 5 mg via ORAL
  Filled 2020-10-01: qty 1

## 2020-10-01 MED ORDER — MIDAZOLAM HCL 2 MG/2ML IJ SOLN
INTRAMUSCULAR | Status: AC
  Filled 2020-10-01: qty 2

## 2020-10-01 MED ORDER — PREDNISONE 20 MG PO TABS
40.0000 mg | ORAL_TABLET | Freq: Every day | ORAL | Status: DC
  Administered 2020-10-02: 40 mg via ORAL
  Filled 2020-10-01: qty 2

## 2020-10-01 MED ORDER — SODIUM CHLORIDE 0.9 % IV SOLN: INTRAVENOUS | Status: DC

## 2020-10-01 MED ORDER — TIROFIBAN HCL IN NACL 5-0.9 MG/100ML-% IV SOLN
0.1500 ug/kg/min | INTRAVENOUS | Status: DC
  Administered 2020-10-01 – 2020-10-02 (×3): 0.15 ug/kg/min via INTRAVENOUS
  Filled 2020-10-01 (×4): qty 100

## 2020-10-01 SURGICAL SUPPLY — 30 items
BALLN ARMADA 14X60X80 (BALLOONS) ×2
BALLN ULTRVRSE 7X40X75C (BALLOONS) ×2
BALLN ULTRVRSE PTA 5X40X75C (BALLOONS) ×2
BALLOON ARMADA 14X60X80 (BALLOONS) ×1 IMPLANT
BALLOON ULTRVRSE 7X40X75C (BALLOONS) ×1 IMPLANT
BALLOON ULTRVRSE PTA 5X40X75C (BALLOONS) ×1 IMPLANT
CANISTER PENUMBRA ENGINE (MISCELLANEOUS) ×2 IMPLANT
CANNULA 5F STIFF (CANNULA) ×2 IMPLANT
CATH ANGIO 5F PIGTAIL 65CM (CATHETERS) ×2 IMPLANT
CATH BEACON 5 .035 40 KMP TP (CATHETERS) ×1 IMPLANT
CATH BEACON 5 .038 40 KMP TP (CATHETERS) ×1
CATH INDIGO 7D KIT (CATHETERS) ×2 IMPLANT
CATH VS15FR (CATHETERS) ×2 IMPLANT
COVER PROBE U/S 5X48 (MISCELLANEOUS) ×2 IMPLANT
DERMABOND ADVANCED (GAUZE/BANDAGES/DRESSINGS) ×1
DERMABOND ADVANCED .7 DNX12 (GAUZE/BANDAGES/DRESSINGS) ×1 IMPLANT
DEVICE SAFEGUARD 24CM (GAUZE/BANDAGES/DRESSINGS) ×4 IMPLANT
DEVICE STARCLOSE SE CLOSURE (Vascular Products) ×4 IMPLANT
GLIDEWIRE ADV .035X180CM (WIRE) ×4 IMPLANT
INTRODUCER 7FR 23CM (INTRODUCER) ×2 IMPLANT
KIT ENCORE 26 ADVANTAGE (KITS) ×4 IMPLANT
PACK ANGIOGRAPHY (CUSTOM PROCEDURE TRAY) ×2 IMPLANT
SHEATH BRITE TIP 5FRX11 (SHEATH) ×2 IMPLANT
SHEATH BRITE TIP 7FRX11 (SHEATH) ×2 IMPLANT
SHEATH BRITE TIP 7FRX5.5 (SHEATH) ×2 IMPLANT
STENT LIFESTREAM 8X58X80 (Permanent Stent) ×4 IMPLANT
SYR MEDRAD MARK 7 150ML (SYRINGE) ×2 IMPLANT
TUBING CONTRAST HIGH PRESS 72 (TUBING) ×2 IMPLANT
WIRE GUIDERIGHT .035X150 (WIRE) ×2 IMPLANT
WIRE ROSEN-J .035X260CM (WIRE) ×2 IMPLANT

## 2020-10-01 NOTE — Progress Notes (Signed)
Subjective:    Patient ID: Alexandra [name], female    DOB: 1970-09-12, 50 y.o.   MRN: [id number] Chief Complaint  Patient presents with   Follow-up    Purple toes [place]    Alexandra [name] is a 50 y.o. female.  The patient returns today for noninvasive studies related to pain and discoloration of her left toes.  The patient recently underwent a right below-knee amputation.  Few days prior to that, she underwent aorta and right iliac thrombectomy for thrombosis with kissing stent placement to both common iliac arteries.   Her first and second toe are somewhat discolored.  She is having pain that makes her dangle her foot concerning for rest pain.  The foot is also a little bit swollen on the left side.  The patient underwent noninvasive studies.  Noninvasive studies show an ABI of 0.58 on the left with dampened monophasic waveforms.  There is no flow detected in the great toe.  The TBI 0.   Review of Systems  Musculoskeletal:  Positive for gait problem.  Skin:  Positive for wound.  Neurological:  Positive for weakness.  All other systems reviewed and are negative.     Objective:   Physical Exam Vitals reviewed.  HENT:     Head: Normocephalic.  Cardiovascular:     Pulses:          Dorsalis pedis pulses are 0 on the left side.       Posterior tibial pulses are 0 on the left side.  Pulmonary:     Effort: Pulmonary effort is normal.  Musculoskeletal:     Left lower leg: Edema present.     Right Lower Extremity: Right leg is amputated below knee.  Skin:    General: Skin is cool.     Coloration: Skin is cyanotic.  Neurological:     Mental Status: She is alert and oriented to person, place, and time.     Motor: Weakness present.     Gait: Gait abnormal.  Psychiatric:        Mood and Affect: Mood normal.        Behavior: Behavior normal.        Thought Content: Thought content normal.        Judgment: Judgment normal.    BP 100/66   Pulse 86   Ht 5\' 2"  (1.575 m)   Wt 129 lb  (58.5 kg)   LMP 01/14/2009   BMI 23.59 kg/m   Past Medical History:  Diagnosis Date   Breast discharge 2013   present X 3 years  Left only multiple ducts per pt   Depression     Social History   Socioeconomic History   Marital status: Divorced    Spouse name: Not on file   Number of children: Not on file   Years of education: Not on file   Highest education level: Not on file  Occupational History   Not on file  Tobacco Use   Smoking status: Every Day    Packs/day: 1.00    Years: 29.00    Pack years: 29.00    Types: Cigarettes   Smokeless tobacco: Never  Vaping Use   Vaping Use: Never used  Substance and Sexual Activity   Alcohol use: Yes    Alcohol/week: 12.0 standard drinks    Types: 12 Cans of beer per week    Comment: social   Drug use: No   Sexual activity: Yes  Other Topics Concern  Not on file  Social History Narrative   Not on file   Social Determinants of Health   Financial Resource Strain: Not on file  Food Insecurity: Not on file  Transportation Needs: Not on file  Physical Activity: Not on file  Stress: Not on file  Social Connections: Not on file  Intimate Partner Violence: Not on file    Past Surgical History:  Procedure Laterality Date   ABDOMINAL HYSTERECTOMY     AMPUTATION Right 09/12/2020   Procedure: AMPUTATION BELOW KNEE;  Surgeon: Annice Needy, MD;  Location: [facility];  Service: Vascular;  Laterality: Right;   BREAST CYST EXCISION Left 2006   ENDOVASCULAR STENT INSERTION N/A 09/08/2020   Procedure: ENDOVASCULAR STENT GRAFT INSERTION;  Surgeon: Annice Needy, MD;  Location: [facility];  Service: Vascular;  Laterality: N/A;   LOWER EXTREMITY ANGIOGRAPHY Right 09/01/2020   Procedure: Lower Extremity Angiography;  Surgeon: Annice Needy, MD;  Location: [facility];  Service: Cardiovascular;  Laterality: Right;   LOWER EXTREMITY INTERVENTION Right 09/07/2020   Procedure: LOWER EXTREMITY INTERVENTION;  Surgeon: Nada Libman, MD;   Location: [facility];  Service: Cardiovascular;  Laterality: Right;    Family History  Problem Relation Age of Onset   Breast cancer Mother 52   Breast cancer Maternal Aunt    Breast cancer Maternal Grandmother    Breast cancer Maternal Aunt        Maternal Great Aunt    Breast cancer Cousin 31    No Known Allergies  CBC Latest Ref Rng & Units 09/19/2020 09/15/2020 09/13/2020  WBC 4.0 - 10.5 K/uL 13.2(H) 19.8(H) 17.2(H)  Hemoglobin 12.0 - 15.0 g/dL 3.2(I) 7.1(I) 4.5(Y)  Hematocrit 36.0 - 46.0 % 27.8(L) 30.9(L) 28.9(L)  Platelets 150 - 400 K/uL 427(H) 451(H) 379      CMP     Component Value Date/Time   NA 135 09/19/2020 0814   NA 133 (L) 11/08/2013 1424   K 3.8 09/19/2020 0814   K 3.7 11/08/2013 1424   CL 98 09/19/2020 0814   CL 100 11/08/2013 1424   CO2 27 09/19/2020 0814   CO2 23 11/08/2013 1424   GLUCOSE 140 (H) 09/19/2020 0814   GLUCOSE 92 11/08/2013 1424   BUN 29 (H) 09/19/2020 0814   BUN 10 11/08/2013 1424   CREATININE 0.34 (L) 09/19/2020 0814   CREATININE 0.73 11/08/2013 1424   CALCIUM 8.9 09/19/2020 0814   CALCIUM 8.3 (L) 11/08/2013 1424   PROT 6.1 (L) 09/19/2020 0814   PROT 7.1 09/17/2013 0853   ALBUMIN 3.0 (L) 09/19/2020 0814   ALBUMIN 3.3 (L) 09/17/2013 0853   AST 23 09/19/2020 0814   AST 22 09/17/2013 0853   ALT 25 09/19/2020 0814   ALT 21 09/17/2013 0853   ALKPHOS 59 09/19/2020 0814   ALKPHOS 76 09/17/2013 0853   BILITOT 0.5 09/19/2020 0814   BILITOT 0.1 (L) 09/17/2013 0853   GFRNONAA >60 09/19/2020 0814   GFRNONAA >60 11/08/2013 1424   GFRAA >60 03/25/2017 1955   GFRAA >60 11/08/2013 1424     VAS [place] ABI WITH/WO TBI  Result Date: 09/30/2020  LOWER EXTREMITY DOPPLER STUDY Patient Name:  Alexandra [name]  Date of Exam:   09/29/2020 Medical Rec #: [id number]        Accession #:    [id number] Date of Birth: 1971-02-26        Patient Gender: F Patient Age:   64Y Exam Location:  [facility] Procedure:  VAS US ABI WITH/WO TBI  Referring Phys: [id number] JASON S DEW --------------------------------------------------------------------------------  Indications: Peripheral artery disease, and right BKA and Left great and second              toe pain and discoloration.  Performing Technologist: Valarie Baldwin RT (R)(VS)  Examination Guidelines: A complete evaluation includes at minimum, Doppler waveform signals and systolic blood pressure reading at the level of bilateral brachial, anterior tibial, and posterior tibial arteries, when vessel segments are accessible. Bilateral testing is considered an integral part of a complete examination. Photoelectric Plethysmograph (PPG) waveforms and toe systolic pressure readings are included as required and additional duplex testing as needed. Limited examinations for reoccurring indications may be performed as noted.  ABI Findings: +--------+------------------+-----+--------+--------+ Right   Rt Pressure (mmHg)IndexWaveformComment  +--------+------------------+-----+--------+--------+ Brachial120                            BKA      +--------+------------------+-----+--------+--------+ +---------+------------------+-----+----------+-------+ Left     Lt Pressure (mmHg)IndexWaveform  Comment +---------+------------------+-----+----------+-------+ Brachial 129                                      +---------+------------------+-----+----------+-------+ ATA      69                0.53 monophasic        +---------+------------------+-----+----------+-------+ PTA      75                0.58 monophasic        +---------+------------------+-----+----------+-------+ Great Toe0                 0.00                   +---------+------------------+-----+----------+-------+ Summary: Left: Resting left ankle-brachial index indicates moderate left lower extremity arterial disease. The left toe-brachial index is abnormal. No flow detected in the left great toe.  *See table(s) above  for measurements and observations.  Electronically signed by Jason Dew MD on 09/30/2020 at 1:32:04 PM.   Final        Assessment & Plan:   1. Aortic thrombus (HCC) ABIs definitely determined that there is a significant issue with perfusion left lower extremity.  What is unclear from the studies is whether the previously stent placement has occluded or if this is possibly distal embolization from her aortic thrombus.  Patient will remain on Eliquis.  2. Blue toe syndrome, left (HCC) Recommend:  The patient has evidence of severe atherosclerotic changes of both lower extremities with rest pain that is associated with preulcerative changes and impending tissue loss of the foot.  This represents a limb threatening ischemia and places the patient at the risk for limb loss.  Patient should undergo angiography of the lower extremities with the hope for intervention for limb salvage.  The risks and benefits as well as the alternative therapies was discussed in detail with the patient.  All questions were answered.  Patient agrees to proceed with angiography.  The patient will follow up with me in the office after the procedure.       3. Tobacco use disorder Given patient's peripheral artery disease smoking cessation will be necessary.  Or some numbers are recurrence.   Current Outpatient Medications on File Prior to Visit  Medication Sig Dispense Refill   atorvastatin (LIPITOR) 40 MG   tablet Take 1 tablet (40 mg total) by mouth once daily. 30 tablet 4   cholecalciferol (VITAMIN D) 25 MCG (1000 UNIT) tablet Take 1,000 Units by mouth daily. For Vitamin D maintenance after completion of Vitamin D treatment.     citalopram (CELEXA) 40 MG tablet Take 1 tablet (40 mg total) by mouth once daily. 30 tablet 2   ferrous gluconate (FERGON) 324 MG tablet Take 1 tablet (324 mg total) by mouth 2 (two) times daily with a meal. 60 tablet 3   gabapentin (NEURONTIN) 100 MG capsule SMARTSIG:3 By Mouth Every 8 Hours  PRN     gabapentin (NEURONTIN) 300 MG capsule Take 300 mg by mouth 3 (three) times daily.     HYDROcodone-acetaminophen (NORCO/VICODIN) 5-325 MG tablet Take 1-2 tablets by mouth every 6 (six) hours as needed for severe pain or moderate pain. 30 tablet 0   HYDROmorphone (DILAUDID) 2 MG tablet Take 1 tablet (2 mg total) by mouth every 6 (six) hours as needed for severe pain. 20 tablet 0   IRON 100/C 100-250 MG TABS SMARTSIG:1 Tablet(s) By Mouth Every 12 Hours     nicotine (NICODERM CQ - DOSED IN MG/24 HOURS) 21 mg/24hr patch Place 1 patch (21 mg total) onto the skin daily. 28 patch 0   [DISCONTINUED] albuterol (PROVENTIL HFA;VENTOLIN HFA) 108 (90 Base) MCG/ACT inhaler Inhale 2 puffs into the lungs every 6 (six) hours as needed. 1 Inhaler 0   No current facility-administered medications on file prior to visit.    There are no Patient Instructions on file for this visit. No follow-ups on file.   Georgiana Spinner, NP

## 2020-10-01 NOTE — Progress Notes (Addendum)
Patient arrived on the floor after vascular procedure performed.   Bilat groin sites with PAD dressing assessed.  Oozing noted from left groin site.  Per specials some leakage right after PAD is applied. Cleaned below groin site, will continue to monitor.    Not even 5 minutes later, more blood had seeped down the groin.  Cleaned site again and will continue to monitor.   I cleaned site again.    I cleaned site for different times.  Paged the MD (Dr. Wyn Quaker) to let him know.  PA will come to assess.    Held aggrastat for 1 hour.

## 2020-10-01 NOTE — H&P (View-Only) (Signed)
Subjective:    Patient ID: Alexandra [name], female    DOB: 1970-09-12, 50 y.o.   MRN: [id number] Chief Complaint  Patient presents with   Follow-up    Purple toes [place]    Alexandra [name] is a 50 y.o. female.  The patient returns today for noninvasive studies related to pain and discoloration of her left toes.  The patient recently underwent a right below-knee amputation.  Few days prior to that, she underwent aorta and right iliac thrombectomy for thrombosis with kissing stent placement to both common iliac arteries.   Her first and second toe are somewhat discolored.  She is having pain that makes her dangle her foot concerning for rest pain.  The foot is also a little bit swollen on the left side.  The patient underwent noninvasive studies.  Noninvasive studies show an ABI of 0.58 on the left with dampened monophasic waveforms.  There is no flow detected in the great toe.  The TBI 0.   Review of Systems  Musculoskeletal:  Positive for gait problem.  Skin:  Positive for wound.  Neurological:  Positive for weakness.  All other systems reviewed and are negative.     Objective:   Physical Exam Vitals reviewed.  HENT:     Head: Normocephalic.  Cardiovascular:     Pulses:          Dorsalis pedis pulses are 0 on the left side.       Posterior tibial pulses are 0 on the left side.  Pulmonary:     Effort: Pulmonary effort is normal.  Musculoskeletal:     Left lower leg: Edema present.     Right Lower Extremity: Right leg is amputated below knee.  Skin:    General: Skin is cool.     Coloration: Skin is cyanotic.  Neurological:     Mental Status: She is alert and oriented to person, place, and time.     Motor: Weakness present.     Gait: Gait abnormal.  Psychiatric:        Mood and Affect: Mood normal.        Behavior: Behavior normal.        Thought Content: Thought content normal.        Judgment: Judgment normal.    BP 100/66   Pulse 86   Ht 5\' 2"  (1.575 m)   Wt 129 lb  (58.5 kg)   LMP 01/14/2009   BMI 23.59 kg/m   Past Medical History:  Diagnosis Date   Breast discharge 2013   present X 3 years  Left only multiple ducts per pt   Depression     Social History   Socioeconomic History   Marital status: Divorced    Spouse name: Not on file   Number of children: Not on file   Years of education: Not on file   Highest education level: Not on file  Occupational History   Not on file  Tobacco Use   Smoking status: Every Day    Packs/day: 1.00    Years: 29.00    Pack years: 29.00    Types: Cigarettes   Smokeless tobacco: Never  Vaping Use   Vaping Use: Never used  Substance and Sexual Activity   Alcohol use: Yes    Alcohol/week: 12.0 standard drinks    Types: 12 Cans of beer per week    Comment: social   Drug use: No   Sexual activity: Yes  Other Topics Concern  Not on file  Social History Narrative   Not on file   Social Determinants of Health   Financial Resource Strain: Not on file  Food Insecurity: Not on file  Transportation Needs: Not on file  Physical Activity: Not on file  Stress: Not on file  Social Connections: Not on file  Intimate Partner Violence: Not on file    Past Surgical History:  Procedure Laterality Date   ABDOMINAL HYSTERECTOMY     AMPUTATION Right 09/12/2020   Procedure: AMPUTATION BELOW KNEE;  Surgeon: Annice Needy, MD;  Location: [facility];  Service: Vascular;  Laterality: Right;   BREAST CYST EXCISION Left 2006   ENDOVASCULAR STENT INSERTION N/A 09/08/2020   Procedure: ENDOVASCULAR STENT GRAFT INSERTION;  Surgeon: Annice Needy, MD;  Location: [facility];  Service: Vascular;  Laterality: N/A;   LOWER EXTREMITY ANGIOGRAPHY Right 09/01/2020   Procedure: Lower Extremity Angiography;  Surgeon: Annice Needy, MD;  Location: [facility];  Service: Cardiovascular;  Laterality: Right;   LOWER EXTREMITY INTERVENTION Right 09/07/2020   Procedure: LOWER EXTREMITY INTERVENTION;  Surgeon: Nada Libman, MD;   Location: [facility];  Service: Cardiovascular;  Laterality: Right;    Family History  Problem Relation Age of Onset   Breast cancer Mother 52   Breast cancer Maternal Aunt    Breast cancer Maternal Grandmother    Breast cancer Maternal Aunt        Maternal Great Aunt    Breast cancer Cousin 31    No Known Allergies  CBC Latest Ref Rng & Units 09/19/2020 09/15/2020 09/13/2020  WBC 4.0 - 10.5 K/uL 13.2(H) 19.8(H) 17.2(H)  Hemoglobin 12.0 - 15.0 g/dL 3.2(I) 7.1(I) 4.5(Y)  Hematocrit 36.0 - 46.0 % 27.8(L) 30.9(L) 28.9(L)  Platelets 150 - 400 K/uL 427(H) 451(H) 379      CMP     Component Value Date/Time   NA 135 09/19/2020 0814   NA 133 (L) 11/08/2013 1424   K 3.8 09/19/2020 0814   K 3.7 11/08/2013 1424   CL 98 09/19/2020 0814   CL 100 11/08/2013 1424   CO2 27 09/19/2020 0814   CO2 23 11/08/2013 1424   GLUCOSE 140 (H) 09/19/2020 0814   GLUCOSE 92 11/08/2013 1424   BUN 29 (H) 09/19/2020 0814   BUN 10 11/08/2013 1424   CREATININE 0.34 (L) 09/19/2020 0814   CREATININE 0.73 11/08/2013 1424   CALCIUM 8.9 09/19/2020 0814   CALCIUM 8.3 (L) 11/08/2013 1424   PROT 6.1 (L) 09/19/2020 0814   PROT 7.1 09/17/2013 0853   ALBUMIN 3.0 (L) 09/19/2020 0814   ALBUMIN 3.3 (L) 09/17/2013 0853   AST 23 09/19/2020 0814   AST 22 09/17/2013 0853   ALT 25 09/19/2020 0814   ALT 21 09/17/2013 0853   ALKPHOS 59 09/19/2020 0814   ALKPHOS 76 09/17/2013 0853   BILITOT 0.5 09/19/2020 0814   BILITOT 0.1 (L) 09/17/2013 0853   GFRNONAA >60 09/19/2020 0814   GFRNONAA >60 11/08/2013 1424   GFRAA >60 03/25/2017 1955   GFRAA >60 11/08/2013 1424     VAS [place] ABI WITH/WO TBI  Result Date: 09/30/2020  LOWER EXTREMITY DOPPLER STUDY Patient Name:  Alexandra [name]  Date of Exam:   09/29/2020 Medical Rec #: [id number]        Accession #:    [id number] Date of Birth: 1971-02-26        Patient Gender: F Patient Age:   64Y Exam Location:  [facility] Procedure:  VAS US ABI WITH/WO TBI  Referring Phys: [id number] JASON S DEW --------------------------------------------------------------------------------  Indications: Peripheral artery disease, and right BKA and Left great and second              toe pain and discoloration.  Performing Technologist: Reece AgarValarie Baldwin RT (R)(VS)  Examination Guidelines: A complete evaluation includes at minimum, Doppler waveform signals and systolic blood pressure reading at the level of bilateral brachial, anterior tibial, and posterior tibial arteries, when vessel segments are accessible. Bilateral testing is considered an integral part of a complete examination. Photoelectric Plethysmograph (PPG) waveforms and toe systolic pressure readings are included as required and additional duplex testing as needed. Limited examinations for reoccurring indications may be performed as noted.  ABI Findings: +--------+------------------+-----+--------+--------+ Right   Rt Pressure (mmHg)IndexWaveformComment  +--------+------------------+-----+--------+--------+ Brachial120                            BKA      +--------+------------------+-----+--------+--------+ +---------+------------------+-----+----------+-------+ Left     Lt Pressure (mmHg)IndexWaveform  Comment +---------+------------------+-----+----------+-------+ Brachial 129                                      +---------+------------------+-----+----------+-------+ ATA      69                0.53 monophasic        +---------+------------------+-----+----------+-------+ PTA      75                0.58 monophasic        +---------+------------------+-----+----------+-------+ Great Toe0                 0.00                   +---------+------------------+-----+----------+-------+ Summary: Left: Resting left ankle-brachial index indicates moderate left lower extremity arterial disease. The left toe-brachial index is abnormal. No flow detected in the left great toe.  *See table(s) above  for measurements and observations.  Electronically signed by Festus BarrenJason Dew MD on 09/30/2020 at 1:32:04 PM.   Final        Assessment & Plan:   1. Aortic thrombus (HCC) ABIs definitely determined that there is a significant issue with perfusion left lower extremity.  What is unclear from the studies is whether the previously stent placement has occluded or if this is possibly distal embolization from her aortic thrombus.  Patient will remain on Eliquis.  2. Blue toe syndrome, left (HCC) Recommend:  The patient has evidence of severe atherosclerotic changes of both lower extremities with rest pain that is associated with preulcerative changes and impending tissue loss of the foot.  This represents a limb threatening ischemia and places the patient at the risk for limb loss.  Patient should undergo angiography of the lower extremities with the hope for intervention for limb salvage.  The risks and benefits as well as the alternative therapies was discussed in detail with the patient.  All questions were answered.  Patient agrees to proceed with angiography.  The patient will follow up with me in the office after the procedure.       3. Tobacco use disorder Given patient's peripheral artery disease smoking cessation will be necessary.  Or some numbers are recurrence.   Current Outpatient Medications on File Prior to Visit  Medication Sig Dispense Refill   atorvastatin (LIPITOR) 40 MG  tablet Take 1 tablet (40 mg total) by mouth once daily. 30 tablet 4   cholecalciferol (VITAMIN D) 25 MCG (1000 UNIT) tablet Take 1,000 Units by mouth daily. For Vitamin D maintenance after completion of Vitamin D treatment.     citalopram (CELEXA) 40 MG tablet Take 1 tablet (40 mg total) by mouth once daily. 30 tablet 2   ferrous gluconate (FERGON) 324 MG tablet Take 1 tablet (324 mg total) by mouth 2 (two) times daily with a meal. 60 tablet 3   gabapentin (NEURONTIN) 100 MG capsule SMARTSIG:3 By Mouth Every 8 Hours  PRN     gabapentin (NEURONTIN) 300 MG capsule Take 300 mg by mouth 3 (three) times daily.     HYDROcodone-acetaminophen (NORCO/VICODIN) 5-325 MG tablet Take 1-2 tablets by mouth every 6 (six) hours as needed for severe pain or moderate pain. 30 tablet 0   HYDROmorphone (DILAUDID) 2 MG tablet Take 1 tablet (2 mg total) by mouth every 6 (six) hours as needed for severe pain. 20 tablet 0   IRON 100/C 100-250 MG TABS SMARTSIG:1 Tablet(s) By Mouth Every 12 Hours     nicotine (NICODERM CQ - DOSED IN MG/24 HOURS) 21 mg/24hr patch Place 1 patch (21 mg total) onto the skin daily. 28 patch 0   [DISCONTINUED] albuterol (PROVENTIL HFA;VENTOLIN HFA) 108 (90 Base) MCG/ACT inhaler Inhale 2 puffs into the lungs every 6 (six) hours as needed. 1 Inhaler 0   No current facility-administered medications on file prior to visit.    There are no Patient Instructions on file for this visit. No follow-ups on file.   Georgiana Spinner, NP

## 2020-10-01 NOTE — Interval H&P Note (Signed)
History and Physical Interval Note:  10/01/2020 9:43 AM  Alexandra [name]  has presented today for surgery, with the diagnosis of LLE Angiography   Ischemic leg   BARD Rep  cc: Loni Muse.  The various methods of treatment have been discussed with the patient and family. After consideration of risks, benefits and other options for treatment, the patient has consented to  Procedure(s): LOWER EXTREMITY ANGIOGRAPHY (Left) as a surgical intervention.  The patient's history has been reviewed, patient examined, no change in status, stable for surgery.  I have reviewed the patient's chart and labs.  Questions were answered to the patient's satisfaction.     Festus Barren

## 2020-10-01 NOTE — Op Note (Signed)
Alexandra [name]  Percutaneous Study/Intervention Procedural Note   Date of Surgery: 10/01/2020  Surgeon(s):Alexandra [name]    Assistants:none  Pre-operative Diagnosis: PAD with rest [name] left leg, s/p right BKA  Post-operative diagnosis:  Same  Procedure(s) Performed:             1.  Ultrasound guidance for vascular access bilateral femoral arteries             2.  Catheter placement into aorta from bilateral femoral approaches             3.  Aortogram and selective left lower extremity angiogram             4.  Mechanical thrombectomy to the left iliac artery and distal aorta with the penumbra CAT 7D catheter as well as mechanical thrombectomy of the right common femoral artery             5.  Angioplasty of the aorta to expand the lifestream stent to 14 mm  6.  Kissing balloon stent placement to bilateral common iliac arteries up into the distal aorta with 8 mm diameter by 58 mm length lifestream stents             7.  StarClose closure device bilateral femoral arteries  EBL: 300 cc  Contrast: 65 cc  Fluoro Time: 28.6 minutes  Moderate Conscious Sedation Time: approximately 99 minutes using 6 mg of Versed and 150 mcg of Fentanyl              Indications:  Patient is a 50 y.o.female with rest [name] of the left foot.  She is s/p aortoiliac intervention for distal aortic and iliac thrombus. The patient has noninvasive study showing a markedly reduced left ABI of 0.5.  She is s/p right BKA. The patient is brought in for angiography for further evaluation and potential treatment.  Due to the limb threatening nature of the situation, angiogram was performed for attempted limb salvage. The patient is aware that if the procedure fails, amputation would be expected.  The patient also understands that even with successful revascularization, amputation may still be required due to the severity of the situation. Risks and benefits are discussed and informed consent is obtained.    Procedure:  The patient was identified and appropriate procedural time out was performed.  The patient was then placed supine on the table and prepped and draped in the usual sterile fashion. Moderate conscious sedation was administered during a face to face encounter with the patient throughout the procedure with my supervision of the RN administering medicines and monitoring the patient's vital signs, pulse oximetry, telemetry and mental status throughout from the start of the procedure until the patient was taken to the recovery room. Ultrasound was used to evaluate the left common femoral artery.  It was patent.  A digital ultrasound image was acquired.  A Seldinger needle was used to access the left common femoral artery under direct ultrasound guidance and a permanent image was performed.  A 0.035 J wire was advanced without resistance and a 5Fr sheath was placed.  Initial imaging through the left femoral sheath showed the left common iliac artery including the previously placed stent to be thrombosed.  I then heparinized the patient and upsized to a 7 [place] sheath.  4 mg of tPA were delivered in the left common iliac artery with penumbra CAT 7D catheter.  Mechanical thrombectomy was then performed of the left common iliac artery and up into  the aorta with the penumbra CAT 7D catheter.  With this, the left common iliac stent that was thrombosed migrated to the aorta.  It was clear were going to have to extend stents proximally and distally to the previous stents, and this would include up into the aorta so I to capture the stent and have it deployed and up into the mid to distal aorta.  I accessed the right femoral artery under direct ultrasound guidance without difficulty with a Seldinger needle and a permanent image was recorded.  We upsized first to a 5 [place] sheath and then a 7 [place] sheath.  A V S1 catheter was placed on the right side to keep the stent from migrating [place] essentially cannulating  the stent and pulling down to the mid to distal aorta.  We then used a 14 mm balloon to angioplasty the aorta and expand the previous 7 mm stent to 14 mm in the mid to distal aorta.  We then made sure we were through the stent from each side and selected an 8 mm diameter by 58 mm length lifestream stents up into the distal aorta bridging to the stent in the aorta and going down to the mid common iliac arteries bilaterally.  These were inflated to 12 atm.  Completion imaging through a pigtail catheter showed the aorta and iliac arteries down to be widely patent.  Imaging was then performed to the left lower extremity femoral sheath.  Selective left lower extremity angiogram was then performed. This demonstrated small but not stenotic left common femoral artery, with widely patent SFA and popliteal arteries.  There was then two-vessel runoff to the left foot through both the anterior tibial and posterior tibial arteries. I elected to terminate the procedure. The sheath was removed and StarClose closure device was deployed in the left femoral artery with excellent hemostatic result.  Imaging through the right femoral sheath showed thrombus in the right common femoral artery, the proximal portion of the profunda and superficial femoral arteries.  This was the side where she has already had an amputation.  I did bring down the penumbra CAT 7D catheter which cleared the common femoral artery but from her access site we were unable to access the profundus superficial femoral arteries.  The patient had recent surgery and we will treat her with anticoagulation for now and see how she does since she is already lost that leg.  StarClose was deployed on that side.  The patient was taken to the recovery room in stable condition having tolerated the procedure well.  Findings:               Aortogram:  renal arteries were widely patent.  The aorta was patent to the distal aorta were there was thrombus.  The right iliac stent  had good flow initially and the left iliac stent was thrombosed.              Left Lower Extremity:  small but not stenotic left common femoral artery, SFA and popliteal arteries are widely patent.  Two-vessel runoff to the left foot through both the anterior tibial and posterior tibial arteries.   Disposition: Patient was taken to the recovery room in stable condition having tolerated the procedure well.  Complications: None  Alexandra [name] 10/01/2020 12:17 PM   This note was created with Dragon Medical transcription system. Any errors in dictation are purely unintentional.

## 2020-10-01 NOTE — Progress Notes (Signed)
PA check site.  Removed PAD dressing and gauze pressure dressing applied.  Bleeding had stopped.  Will continue to monitor

## 2020-10-02 ENCOUNTER — Encounter: Payer: Self-pay | Admitting: Vascular Surgery

## 2020-10-02 DIAGNOSIS — I998 Other disorder of circulatory system: Secondary | ICD-10-CM | POA: Diagnosis not present

## 2020-10-02 DIAGNOSIS — I70222 Atherosclerosis of native arteries of extremities with rest pain, left leg: Secondary | ICD-10-CM | POA: Diagnosis not present

## 2020-10-02 MED ORDER — ASPIRIN 81 MG PO TBEC: 81.0000 mg | DELAYED_RELEASE_TABLET | Freq: Every day | ORAL | 3 refills | Status: AC

## 2020-10-02 MED ORDER — ASPIRIN EC 81 MG PO TBEC
81.0000 mg | DELAYED_RELEASE_TABLET | Freq: Every day | ORAL | Status: DC
  Administered 2020-10-02: 81 mg via ORAL
  Filled 2020-10-02: qty 1

## 2020-10-02 MED ORDER — HYDROCODONE-ACETAMINOPHEN 5-325 MG PO TABS: 1.0000 | ORAL_TABLET | Freq: Four times a day (QID) | ORAL | 0 refills | Status: DC | PRN

## 2020-10-02 NOTE — Discharge Instructions (Addendum)
1) You may shower. Please gently clean your stump with soap and water. Gently pat dry. 2) Please do not engage in strenuous activity or lifting greater than 10 pounds until you are cleared during a post-operative visit.

## 2020-10-03 NOTE — Progress Notes (Signed)
The following Assist/Replace Program for Rituxan from [employer] [employer] has been terminated due to Active medicaid as of 07/13/2020.  Last DOS: 09/05/2020.   Virgina Jock, CPhT IV Drug Replacement Specialist  [facility] Phone: [phone]

## 2020-10-03 NOTE — Discharge Summary (Signed)
[facility]    Discharge Summary  Patient ID:  Alexandra [name] MRN: [id number] DOB/AGE: [birth year] 50 y.o.  Admit date: 10/01/2020 Discharge date: 10/01/20 Date of Surgery: 10/01/2020 Surgeon: Surgeon(s): Annice Needy, MD  Admission Diagnosis: Ischemic leg [I99.8]  Discharge Diagnoses:  Ischemic leg [I99.8]  Secondary Diagnoses: Past Medical History:  Diagnosis Date   Breast discharge 2013   present X 3 years  Left only multiple ducts per pt   Depression    Procedure(s): 10/01/20:             1.  Ultrasound guidance for vascular access bilateral femoral arteries             2.  Catheter placement into aorta from bilateral femoral approaches             3.  Aortogram and selective left lower extremity angiogram             4.  Mechanical thrombectomy to the left iliac artery and distal aorta with the penumbra CAT 7D catheter as well as mechanical thrombectomy of the right common femoral artery             5.  Angioplasty of the aorta to expand the lifestream stent to 14 mm             6.  Kissing balloon stent placement to bilateral common iliac arteries up into the distal aorta with 8 mm diameter by 58 mm length lifestream stents             7.  StarClose closure device bilateral femoral arteries  Discharged Condition: Good  HPI / Hospital Course:  Patient is a 50 y.o.female with rest pain of the left foot.  She is s/p aortoiliac intervention for distal aortic and iliac thrombus. The patient has noninvasive study showing a markedly reduced left ABI of 0.5.  She is s/p right BKA. The patient is brought in for angiography for further evaluation and potential treatment.  Due to the limb threatening nature of the situation, angiogram was performed for attempted limb salvage. The patient is aware that if the procedure fails, amputation would be expected.  The patient also understands that even with successful revascularization, amputation may still be required  due to the severity of the situation. Risks and benefits are discussed and informed consent is obtained. On 10/01/20, the patient underwent:              1.  Ultrasound guidance for vascular access bilateral femoral arteries             2.  Catheter placement into aorta from bilateral femoral approaches             3.  Aortogram and selective left lower extremity angiogram             4.  Mechanical thrombectomy to the left iliac artery and distal aorta with the penumbra CAT 7D catheter as well as mechanical thrombectomy of the right common femoral artery             5.  Angioplasty of the aorta to expand the lifestream stent to 14 mm             6.  Kissing balloon stent placement to bilateral common iliac arteries up into the distal aorta with 8 mm diameter by 58 mm length lifestream stents             7.  StarClose closure device  bilateral femoral arteries  The patient tolerated the procedure well was transferred from the angiography suite to the surgical floor for observation overnight.  Patient's night of procedure was unremarkable.  During her brief stay, her diet was advanced, she was urinating, her pain was controlled to the use of p.o. pain medication and she was ambulating at baseline.  Day of discharge the patient was afebrile with stable vital signs and essentially unremarkable physical exam.  Physical Exam:  Alert and oriented x3, no acute distress Cardiovascular: Regular, rate, and rhythm Pulmonary: Clear to auscultation bilaterally Abdomen: Soft and nontender Groin:  Access site: Pain dry and intact.  No swelling or drainage. Right lower extremity: Thigh soft.  Stump is healthy.  Warm. Left lower extremity: Thigh soft.  Calf soft.  Extremities warm distally toes.  Motor/sensory is intact.  Labs: As below  Complications: None  Consults: None  Significant Diagnostic Studies: CBC Lab Results  Component Value Date   WBC 16.2 (H) 10/01/2020   HGB 9.8 (L) 10/01/2020   HCT  31.4 (L) 10/01/2020   MCV 84.0 10/01/2020   PLT 382 10/01/2020   BMET    Component Value Date/Time   NA 137 10/01/2020 1558   NA 133 (L) 11/08/2013 1424   K 4.5 10/01/2020 1558   K 3.7 11/08/2013 1424   CL 99 10/01/2020 1558   CL 100 11/08/2013 1424   CO2 27 10/01/2020 1558   CO2 23 11/08/2013 1424   GLUCOSE 117 (H) 10/01/2020 1558   GLUCOSE 92 11/08/2013 1424   BUN 21 (H) 10/01/2020 1558   BUN 10 11/08/2013 1424   CREATININE 0.47 10/01/2020 1558   CREATININE 0.73 11/08/2013 1424   CALCIUM 8.7 (L) 10/01/2020 1558   CALCIUM 8.3 (L) 11/08/2013 1424   GFRNONAA >60 10/01/2020 1558   GFRNONAA >60 11/08/2013 1424   GFRAA >60 03/25/2017 1955   GFRAA >60 11/08/2013 1424   COAG Lab Results  Component Value Date   INR 1.1 10/01/2020   INR 1.0 09/12/2020   INR 1.1 08/28/2020   Disposition:  Discharge to :Home  Allergies as of 10/02/2020   No Known Allergies      Medication List     STOP taking these medications    HYDROmorphone 2 MG tablet Commonly known as: DILAUDID       TAKE these medications    apixaban 5 MG Tabs tablet Commonly known as: ELIQUIS Take 1 tablet (5 mg total) by mouth 2 (two) times daily.   aspirin 81 MG EC tablet Take 1 tablet (81 mg total) by mouth daily. Swallow whole.   atorvastatin 40 MG tablet Commonly known as: LIPITOR Take 1 tablet (40 mg total) by mouth once daily.   cholecalciferol 25 MCG (1000 UNIT) tablet Commonly known as: VITAMIN D Take 1,000 Units by mouth daily. For Vitamin D maintenance after completion of Vitamin D treatment.   citalopram 40 MG tablet Commonly known as: CELEXA Take 1 tablet (40 mg total) by mouth once daily.   ferrous gluconate 324 MG tablet Commonly known as: FERGON Take 1 tablet (324 mg total) by mouth 2 (two) times daily with a meal.   gabapentin 100 MG capsule Commonly known as: NEURONTIN SMARTSIG:3 By Mouth Every 8 Hours PRN   gabapentin 300 MG capsule Commonly known as: NEURONTIN Take  300 mg by mouth 3 (three) times daily.   HYDROcodone-acetaminophen 5-325 MG tablet Commonly known as: NORCO/VICODIN Take 1-2 tablets by mouth every 6 (six) hours as needed for severe pain  or moderate pain.   Iron 100/C 100-250 MG Tabs Generic drug: Iron-Vitamin C SMARTSIG:1 Tablet(s) By Mouth Every 12 Hours   predniSONE 20 MG tablet Commonly known as: DELTASONE Take 2 tablets (40 mg total) by mouth daily before breakfast.       ASK your doctor about these medications    nicotine 21 mg/24hr patch Commonly known as: NICODERM CQ - dosed in mg/24 hours Place 1 patch (21 mg total) onto the skin daily.       Verbal and written Discharge instructions given to the patient. Wound care per Discharge AVS  Follow-up Information     Georgiana Spinner, NP. Go on 10/09/2020.   Specialty: Vascular Surgery Why: @1 :30pm Staple removal. No studies. Contact information: [phone] [place] [phone]                Signed: [phone], PA-C  10/03/2020, 5:36 PM

## 2020-10-06 ENCOUNTER — Encounter: Payer: Self-pay | Admitting: Vascular Surgery

## 2020-10-09 ENCOUNTER — Encounter (INDEPENDENT_AMBULATORY_CARE_PROVIDER_SITE_OTHER): Payer: Self-pay | Admitting: Nurse Practitioner

## 2020-10-09 ENCOUNTER — Ambulatory Visit (INDEPENDENT_AMBULATORY_CARE_PROVIDER_SITE_OTHER): Payer: Medicaid Other | Admitting: Nurse Practitioner

## 2020-10-09 ENCOUNTER — Other Ambulatory Visit: Payer: Self-pay

## 2020-10-09 VITALS — BP 97/63 | HR 92 | Ht 62.0 in | Wt 125.0 lb

## 2020-10-09 DIAGNOSIS — I75022 Atheroembolism of left lower extremity: Secondary | ICD-10-CM

## 2020-10-09 DIAGNOSIS — Z89511 Acquired absence of right leg below knee: Secondary | ICD-10-CM

## 2020-10-09 NOTE — Progress Notes (Signed)
Subjective:    Patient ID: Alexandra [name], female    DOB: 01-Oct-1970, 50 y.o.   MRN: [id number] Chief Complaint  Patient presents with   Follow-up    1wk armc post le angio staple removal    Alexandra [name] is a 50 year old female that presents today for follow-up evaluation due to right below-knee amputation as well as left ischemic foot.  The patient recently underwent revascularization of her left lower extremity on 10/01/2020.  The left foot looks much improved however the patient notes tenderness in her great toe.  She does note that with ambulating around the house she does a lot of hopping on this foot.  There are no open wounds or ulcers however the medial portion of the big toe is tender and has some worsening redness.  The patient's right below-knee amputation site is clean dry and intact with no evidence of dehiscence.  The wound itself is healing well with no drainage.  The patient still has her staples in place.  The patient denies any drainage from the wound itself.  She denies any fevers or chills.   Review of Systems  Neurological:  Positive for weakness.  All other systems reviewed and are negative.     Objective:   Physical Exam HENT:     Head: Normocephalic.  Cardiovascular:     Rate and Rhythm: Normal rate.     Pulses:          Dorsalis pedis pulses are 1+ on the left side.  Pulmonary:     Effort: Pulmonary effort is normal.  Musculoskeletal:        General: Tenderness present.     Right Lower Extremity: Right leg is amputated below knee.  Skin:    General: Skin is warm and dry.  Neurological:     Mental Status: She is alert and oriented to person, place, and time.     Motor: Weakness present.     Gait: Gait abnormal.  Psychiatric:        Mood and Affect: Mood normal.        Behavior: Behavior normal.        Thought Content: Thought content normal.        Judgment: Judgment normal.    BP 97/63   Pulse 92   Ht 5\' 2"  (1.575 m)   Wt 125 lb (56.7 kg)    LMP 01/14/2009   BMI 22.86 kg/m   Past Medical History:  Diagnosis Date   Breast discharge 2013   present X 3 years  Left only multiple ducts per pt   Depression     Social History   Socioeconomic History   Marital status: Divorced    Spouse name: Not on file   Number of children: Not on file   Years of education: Not on file   Highest education level: Not on file  Occupational History   Not on file  Tobacco Use   Smoking status: Former    Packs/day: 1.00    Years: 29.00    Pack years: 29.00    Types: Cigarettes   Smokeless tobacco: Never  Vaping Use   Vaping Use: Never used  Substance and Sexual Activity   Alcohol use: Not Currently    Comment: social   Drug use: No   Sexual activity: Yes  Other Topics Concern   Not on file  Social History Narrative   Not on file   Social Determinants of Health   Financial Resource Strain:  Not on file  Food Insecurity: Not on file  Transportation Needs: Not on file  Physical Activity: Not on file  Stress: Not on file  Social Connections: Not on file  Intimate Partner Violence: Not on file    Past Surgical History:  Procedure Laterality Date   ABDOMINAL HYSTERECTOMY     AMPUTATION Right 09/12/2020   Procedure: AMPUTATION BELOW KNEE;  Surgeon: Annice Needy, MD;  Location: [facility] OR[facility];  Service: Vascular;  Laterality: Right;   BREAST CYST EXCISION Left 2006   ENDOVASCULAR STENT INSERTION N/A 09/08/2020   Procedure: ENDOVASCULAR STENT GRAFT INSERTION;  Surgeon: Annice Needy, MD;  Location: [facility];  Service: Vascular;  Laterality: N/A;   LOWER EXTREMITY ANGIOGRAPHY Right 09/01/2020   Procedure: Lower Extremity Angiography;  Surgeon: Annice Needy, MD;  Location: [facility];  Service: Cardiovascular;  Laterality: Right;   LOWER EXTREMITY ANGIOGRAPHY Left 10/01/2020   Procedure: LOWER EXTREMITY ANGIOGRAPHY;  Surgeon: Annice Needy, MD;  Location: [facility];  Service: Cardiovascular;  Laterality: Left;   LOWER  EXTREMITY INTERVENTION Right 09/07/2020   Procedure: LOWER EXTREMITY INTERVENTION;  Surgeon: Nada Libman, MD;  Location: [facility];  Service: Cardiovascular;  Laterality: Right;    Family History  Problem Relation Age of Onset   Breast cancer Mother 45   Breast cancer Maternal Aunt    Breast cancer Maternal Grandmother    Breast cancer Maternal Aunt        Maternal Great Aunt    Breast cancer Cousin 31    No Known Allergies  CBC Latest Ref Rng & Units 10/01/2020 09/19/2020 09/15/2020  WBC 4.0 - 10.5 K/uL 16.2(H) 13.2(H) 19.8(H)  Hemoglobin 12.0 - 15.0 g/dL 9.3(A) 3.5(T) 7.3(U)  Hematocrit 36.0 - 46.0 % 31.4(L) 27.8(L) 30.9(L)  Platelets 150 - 400 K/uL 382 427(H) 451(H)      CMP     Component Value Date/Time   NA 137 10/01/2020 1558   NA 133 (L) 11/08/2013 1424   K 4.5 10/01/2020 1558   K 3.7 11/08/2013 1424   CL 99 10/01/2020 1558   CL 100 11/08/2013 1424   CO2 27 10/01/2020 1558   CO2 23 11/08/2013 1424   GLUCOSE 117 (H) 10/01/2020 1558   GLUCOSE 92 11/08/2013 1424   BUN 21 (H) 10/01/2020 1558   BUN 10 11/08/2013 1424   CREATININE 0.47 10/01/2020 1558   CREATININE 0.73 11/08/2013 1424   CALCIUM 8.7 (L) 10/01/2020 1558   CALCIUM 8.3 (L) 11/08/2013 1424   PROT 6.0 (L) 10/01/2020 1558   PROT 7.1 09/17/2013 0853   ALBUMIN 3.3 (L) 10/01/2020 1558   ALBUMIN 3.3 (L) 09/17/2013 0853   AST 23 10/01/2020 1558   AST 22 09/17/2013 0853   ALT 25 10/01/2020 1558   ALT 21 09/17/2013 0853   ALKPHOS 75 10/01/2020 1558   ALKPHOS 76 09/17/2013 0853   BILITOT 0.8 10/01/2020 1558   BILITOT 0.1 (L) 09/17/2013 0853   GFRNONAA >60 10/01/2020 1558   GFRNONAA >60 11/08/2013 1424   GFRAA >60 03/25/2017 1955   GFRAA >60 11/08/2013 1424     VAS [place] ABI WITH/WO TBI  Result Date: 09/30/2020  LOWER EXTREMITY DOPPLER STUDY Patient Name:  Alexandra [name]  Date of Exam:   09/29/2020 Medical Rec #: [id number]        Accession #:    [id number] Date of Birth: 11-Jan-1971         Patient Gender: F Patient Age:  50Y Exam Location:  [facility] Procedure:      VAS US ABI WITH/WO TBI Referring Phys: [id number] JASON S DEW --------------------------------------------------------------------------------  Indications: Peripheral artery disease, and right BKA and Left great and second              toe pain and discoloration.  Performing Technologist: Reece AgarValarie Baldwin RT (R)(VS)  Examination Guidelines: A complete evaluation includes at minimum, Doppler waveform signals and systolic blood pressure reading at the level of bilateral brachial, anterior tibial, and posterior tibial arteries, when vessel segments are accessible. Bilateral testing is considered an integral part of a complete examination. Photoelectric Plethysmograph (PPG) waveforms and toe systolic pressure readings are included as required and additional duplex testing as needed. Limited examinations for reoccurring indications may be performed as noted.  ABI Findings: +--------+------------------+-----+--------+--------+ Right   Rt Pressure (mmHg)IndexWaveformComment  +--------+------------------+-----+--------+--------+ Brachial120                            BKA      +--------+------------------+-----+--------+--------+ +---------+------------------+-----+----------+-------+ Left     Lt Pressure (mmHg)IndexWaveform  Comment +---------+------------------+-----+----------+-------+ Brachial 129                                      +---------+------------------+-----+----------+-------+ ATA      69                0.53 monophasic        +---------+------------------+-----+----------+-------+ PTA      75                0.58 monophasic        +---------+------------------+-----+----------+-------+ Great Toe0                 0.00                   +---------+------------------+-----+----------+-------+ Summary: Left: Resting left ankle-brachial index indicates moderate left lower extremity  arterial disease. The left toe-brachial index is abnormal. No flow detected in the left great toe.  *See table(s) above for measurements and observations.  Electronically signed by Festus BarrenJason Dew MD on 09/30/2020 at 1:32:04 PM.   Final        Assessment & Plan:   1. Hx of BKA, right (HCC) All of the staples removed from the below-knee amputation today and this was tolerated relatively well.  Steri-Strips applied.  The wound is completely intact with no area of dehiscence.  If the wound is healed at follow-up in 2 weeks we will proceed with referral for prosthetic.  2. Blue toe syndrome, left (HCC) Toe looks drastically improved following intervention.  She still has some tenderness in her big toe.  This could possibly from trauma of using this leg top for mobility.  There could also be some lingering tenderness due to lack of perfusion while the toe is ischemic.  We will have the patient return with noninvasive studies in 2 weeks to evaluate perfusion the patient is advised to contact our office if the color abruptly changes again.   Current Outpatient Medications on File Prior to Visit  Medication Sig Dispense Refill   apixaban (ELIQUIS) 5 MG TABS tablet Take 1 tablet (5 mg total) by mouth 2 (two) times daily. 60 tablet 4   aspirin EC 81 MG EC tablet Take 1 tablet (81 mg total) by mouth daily. Swallow whole. 90 tablet 3  atorvastatin (LIPITOR) 40 MG tablet Take 1 tablet (40 mg total) by mouth once daily. 30 tablet 4   cholecalciferol (VITAMIN D) 25 MCG (1000 UNIT) tablet Take 1,000 Units by mouth daily. For Vitamin D maintenance after completion of Vitamin D treatment.     citalopram (CELEXA) 40 MG tablet Take 1 tablet (40 mg total) by mouth once daily. 30 tablet 2   ferrous gluconate (FERGON) 324 MG tablet Take 1 tablet (324 mg total) by mouth 2 (two) times daily with a meal. 60 tablet 3   gabapentin (NEURONTIN) 100 MG capsule SMARTSIG:3 By Mouth Every 8 Hours PRN     gabapentin (NEURONTIN) 300 MG  capsule Take 300 mg by mouth 3 (three) times daily.     HYDROcodone-acetaminophen (NORCO/VICODIN) 5-325 MG tablet Take 1-2 tablets by mouth every 6 (six) hours as needed for severe pain or moderate pain. 30 tablet 0   IRON 100/C 100-250 MG TABS SMARTSIG:1 Tablet(s) By Mouth Every 12 Hours     nicotine (NICODERM CQ - DOSED IN MG/24 HOURS) 21 mg/24hr patch Place 1 patch (21 mg total) onto the skin daily. 28 patch 0   predniSONE (DELTASONE) 20 MG tablet Take 2 tablets (40 mg total) by mouth daily before breakfast. 30 tablet 0   [DISCONTINUED] albuterol (PROVENTIL HFA;VENTOLIN HFA) 108 (90 Base) MCG/ACT inhaler Inhale 2 puffs into the lungs every 6 (six) hours as needed. 1 Inhaler 0   No current facility-administered medications on file prior to visit.    There are no Patient Instructions on file for this visit. No follow-ups on file.   Georgiana Spinner, NP

## 2020-10-23 ENCOUNTER — Ambulatory Visit (INDEPENDENT_AMBULATORY_CARE_PROVIDER_SITE_OTHER): Payer: MEDICAID | Admitting: Nurse Practitioner

## 2020-10-24 ENCOUNTER — Other Ambulatory Visit: Payer: Self-pay

## 2020-10-24 ENCOUNTER — Ambulatory Visit (INDEPENDENT_AMBULATORY_CARE_PROVIDER_SITE_OTHER): Payer: Medicaid Other | Admitting: Nurse Practitioner

## 2020-10-24 VITALS — BP 102/66 | HR 96 | Resp 16

## 2020-10-24 DIAGNOSIS — Z89511 Acquired absence of right leg below knee: Secondary | ICD-10-CM

## 2020-10-24 DIAGNOSIS — I70222 Atherosclerosis of native arteries of extremities with rest pain, left leg: Secondary | ICD-10-CM

## 2020-10-25 ENCOUNTER — Encounter (INDEPENDENT_AMBULATORY_CARE_PROVIDER_SITE_OTHER): Payer: Self-pay | Admitting: Nurse Practitioner

## 2020-10-25 NOTE — Progress Notes (Signed)
Subjective:    Patient ID: Alexandra [name], female    DOB: [birth year], 50 y.o.   MRN: [id number] Chief Complaint  Patient presents with  . Wound Check    2 week follow up    Wynonia LawmanWendy Volante is a 50 year old female that presents today for follow-up evaluation due to right below-knee amputation as well as left ischemic foot.  The patient recently underwent revascularization of her left lower extremity on 10/01/2020.  Previously the patient had a lot of tenderness in her great toe but there was no area of necrosis.  Today that toe area has what appears to be a bit of dry necrosis on the great toe as well as some on the second toe.  However it only appears to be skin deep.  The patient notes that it is not painful.  She denies any drainage.  The right below-knee amputation site is also clean dry and intact with no evidence of dehiscence.  The staples were removed last week and Steri-Strips applied.  Steri-Strips are still firmly in place today.  There are no other wounds or ulcerations.   Review of Systems  Skin:  Positive for color change and wound.  All other systems reviewed and are negative.     Objective:   Physical Exam Vitals reviewed.  HENT:     Head: Normocephalic.  Cardiovascular:     Rate and Rhythm: Normal rate.     Pulses:          Dorsalis pedis pulses are 1+ on the left side.  Pulmonary:     Effort: Pulmonary effort is normal.  Musculoskeletal:       Feet:     Right Lower Extremity: Right leg is amputated below knee.  Neurological:     Mental Status: She is alert and oriented to person, place, and time.  Psychiatric:        Mood and Affect: Mood normal.        Behavior: Behavior normal.        Thought Content: Thought content normal.        Judgment: Judgment normal.    BP 102/66 (BP Location: Left Arm)   Pulse 96   Resp 16   LMP 01/14/2009   Past Medical History:  Diagnosis Date  . Breast discharge 2013   present X 3 years  Left only multiple ducts per pt   . Depression     Social History   Socioeconomic History  . Marital status: Divorced    Spouse name: Not on file  . Number of children: Not on file  . Years of education: Not on file  . Highest education level: Not on file  Occupational History  . Not on file  Tobacco Use  . Smoking status: Former    Packs/day: 1.00    Years: 29.00    Pack years: 29.00    Types: Cigarettes  . Smokeless tobacco: Never  Vaping Use  . Vaping Use: Never used  Substance and Sexual Activity  . Alcohol use: Not Currently    Comment: social  . Drug use: No  . Sexual activity: Yes  Other Topics Concern  . Not on file  Social History Narrative  . Not on file   Social Determinants of Health   Financial Resource Strain: Not on file  Food Insecurity: Not on file  Transportation Needs: Not on file  Physical Activity: Not on file  Stress: Not on file  Social Connections: Not on file  Intimate Partner Violence: Not on file    Past Surgical History:  Procedure Laterality Date  . ABDOMINAL HYSTERECTOMY    . AMPUTATION Right 09/12/2020   Procedure: AMPUTATION BELOW KNEE;  Surgeon: Annice Needy, MD;  Location: [facility];  Service: Vascular;  Laterality: Right;  . BREAST CYST EXCISION Left 2006  . ENDOVASCULAR STENT INSERTION N/A 09/08/2020   Procedure: ENDOVASCULAR STENT GRAFT INSERTION;  Surgeon: Annice Needy, MD;  Location: [facility];  Service: Vascular;  Laterality: N/A;  . LOWER EXTREMITY ANGIOGRAPHY Right 09/01/2020   Procedure: Lower Extremity Angiography;  Surgeon: Annice Needy, MD;  Location: [facility];  Service: Cardiovascular;  Laterality: Right;  . LOWER EXTREMITY ANGIOGRAPHY Left 10/01/2020   Procedure: LOWER EXTREMITY ANGIOGRAPHY;  Surgeon: Annice Needy, MD;  Location: [facility];  Service: Cardiovascular;  Laterality: Left;  . LOWER EXTREMITY INTERVENTION Right 09/07/2020   Procedure: LOWER EXTREMITY INTERVENTION;  Surgeon: Nada Libman, MD;  Location: [facility]  [facility];  Service: Cardiovascular;  Laterality: Right;    Family History  Problem Relation Age of Onset  . Breast cancer Mother 7  . Breast cancer Maternal Aunt   . Breast cancer Maternal Grandmother   . Breast cancer Maternal Aunt        Maternal Great Aunt   . Breast cancer Cousin 31    No Known Allergies  CBC Latest Ref Rng & Units 10/01/2020 09/19/2020 09/15/2020  WBC 4.0 - 10.5 K/uL 16.2(H) 13.2(H) 19.8(H)  Hemoglobin 12.0 - 15.0 g/dL 4.3(P) 2.9(J) 1.8(A)  Hematocrit 36.0 - 46.0 % 31.4(L) 27.8(L) 30.9(L)  Platelets 150 - 400 K/uL 382 427(H) 451(H)      CMP     Component Value Date/Time   NA 137 10/01/2020 1558   NA 133 (L) 11/08/2013 1424   K 4.5 10/01/2020 1558   K 3.7 11/08/2013 1424   CL 99 10/01/2020 1558   CL 100 11/08/2013 1424   CO2 27 10/01/2020 1558   CO2 23 11/08/2013 1424   GLUCOSE 117 (H) 10/01/2020 1558   GLUCOSE 92 11/08/2013 1424   BUN 21 (H) 10/01/2020 1558   BUN 10 11/08/2013 1424   CREATININE 0.47 10/01/2020 1558   CREATININE 0.73 11/08/2013 1424   CALCIUM 8.7 (L) 10/01/2020 1558   CALCIUM 8.3 (L) 11/08/2013 1424   PROT 6.0 (L) 10/01/2020 1558   PROT 7.1 09/17/2013 0853   ALBUMIN 3.3 (L) 10/01/2020 1558   ALBUMIN 3.3 (L) 09/17/2013 0853   AST 23 10/01/2020 1558   AST 22 09/17/2013 0853   ALT 25 10/01/2020 1558   ALT 21 09/17/2013 0853   ALKPHOS 75 10/01/2020 1558   ALKPHOS 76 09/17/2013 0853   BILITOT 0.8 10/01/2020 1558   BILITOT 0.1 (L) 09/17/2013 0853   GFRNONAA >60 10/01/2020 1558   GFRNONAA >60 11/08/2013 1424   GFRAA >60 03/25/2017 1955   GFRAA >60 11/08/2013 1424     VAS [place] ABI WITH/WO TBI  Result Date: 09/30/2020  LOWER EXTREMITY DOPPLER STUDY Patient Name:  Alexandra [name]  Date of Exam:   09/29/2020 Medical Rec #: [id number]        Accession #:    [id number] Date of Birth: October 23, 1970        Patient Gender: F Patient Age:   54Y Exam Location:  [facility] Procedure:      VAS [place] ABI WITH/WO TBI Referring Phys: [id number]  JASON S DEW --------------------------------------------------------------------------------  Indications: Peripheral artery disease, and right  BKA and Left great and second              toe pain and discoloration.  Performing Technologist: Reece Agar RT (R)(VS)  Examination Guidelines: A complete evaluation includes at minimum, Doppler waveform signals and systolic blood pressure reading at the level of bilateral brachial, anterior tibial, and posterior tibial arteries, when vessel segments are accessible. Bilateral testing is considered an integral part of a complete examination. Photoelectric Plethysmograph (PPG) waveforms and toe systolic pressure readings are included as required and additional duplex testing as needed. Limited examinations for reoccurring indications may be performed as noted.  ABI Findings: +--------+------------------+-----+--------+--------+ Right   Rt Pressure (mmHg)IndexWaveformComment  +--------+------------------+-----+--------+--------+ Brachial120                            BKA      +--------+------------------+-----+--------+--------+ +---------+------------------+-----+----------+-------+ Left     Lt Pressure (mmHg)IndexWaveform  Comment +---------+------------------+-----+----------+-------+ Brachial 129                                      +---------+------------------+-----+----------+-------+ ATA      69                0.53 monophasic        +---------+------------------+-----+----------+-------+ PTA      75                0.58 monophasic        +---------+------------------+-----+----------+-------+ Great Toe0                 0.00                   +---------+------------------+-----+----------+-------+ Summary: Left: Resting left ankle-brachial index indicates moderate left lower extremity arterial disease. The left toe-brachial index is abnormal. No flow detected in the left great toe.  *See table(s) above for measurements and  observations.  Electronically signed by Festus Barren MD on 09/30/2020 at 1:32:04 PM.   Final        Assessment & Plan:   1. Atherosclerosis of native artery of left lower extremity with rest pain [facility]) Today the patient has discolored areas on her first and second toes.  It appears to be a little bit of dry necrosis however looks only to be skin again.  The patient's foot is nice and warm and she has palpable pulses.  At this point the best option is to wait to see what the area will do.  I discussed with the patient and her mother that we could undergo an angiogram however there is likely that would not be helpful since the patient's foot is warm, reperfusing and instruments are not able to of  to get arteries in the toes.  Second option is amputation that is just too soon to make that determination.  It is possible that the areas may form calluses and sloughed off leaving intact skin.  We will reevaluate this area when the patient follows up in 4 weeks however and if the wound begins to become wet appearing with open areas of drainage more foul-smelling odor and then advised to contact the office as this is a sign it has become unstable.  2. Hx of BKA, right (HCC) Marshelle Bilger was seen for further evaluation for right below knee prosthesis.  She is a woman who had amputation on 04/03/2020.  At the time he is healing  well and ready for fitting of new right below knee prosthesis.  He is a highly motivated individual and should do well once fitted with prosthesis.  She has no problem returning to a K3 ambulator, which will allow her to walk inside and outside of her home and overload level barriers.   We will have the patient return in 2 weeks for wound evaluation  Current Outpatient Medications on File Prior to Visit  Medication Sig Dispense Refill  . apixaban (ELIQUIS) 5 MG TABS tablet Take 1 tablet (5 mg total) by mouth 2 (two) times daily. 60 tablet 4  . aspirin EC 81 MG EC tablet Take 1 tablet (81  mg total) by mouth daily. Swallow whole. 90 tablet 3  . atorvastatin (LIPITOR) 40 MG tablet Take 1 tablet (40 mg total) by mouth once daily. 30 tablet 4  . cholecalciferol (VITAMIN D) 25 MCG (1000 UNIT) tablet Take 1,000 Units by mouth daily. For Vitamin D maintenance after completion of Vitamin D treatment.    . citalopram (CELEXA) 40 MG tablet Take 1 tablet (40 mg total) by mouth once daily. 30 tablet 2  . ferrous gluconate (FERGON) 324 MG tablet Take 1 tablet (324 mg total) by mouth 2 (two) times daily with a meal. 60 tablet 3  . gabapentin (NEURONTIN) 100 MG capsule SMARTSIG:3 By Mouth Every 8 Hours PRN    . gabapentin (NEURONTIN) 300 MG capsule Take 300 mg by mouth 3 (three) times daily.    Marland Kitchen HYDROcodone-acetaminophen (NORCO/VICODIN) 5-325 MG tablet Take 1-2 tablets by mouth every 6 (six) hours as needed for severe pain or moderate pain. 30 tablet 0  . IRON 100/C 100-250 MG TABS SMARTSIG:1 Tablet(s) By Mouth Every 12 Hours    . nicotine (NICODERM CQ - DOSED IN MG/24 HOURS) 21 mg/24hr patch Place 1 patch (21 mg total) onto the skin daily. 28 patch 0  . predniSONE (DELTASONE) 10 MG tablet Take by mouth.    . predniSONE (DELTASONE) 20 MG tablet Take 2 tablets (40 mg total) by mouth daily before breakfast. (Patient not taking: Reported on 10/24/2020) 30 tablet 0  . [DISCONTINUED] albuterol (PROVENTIL HFA;VENTOLIN HFA) 108 (90 Base) MCG/ACT inhaler Inhale 2 puffs into the lungs every 6 (six) hours as needed. 1 Inhaler 0   No current facility-administered medications on file prior to visit.    There are no Patient Instructions on file for this visit. No follow-ups on file.   Georgiana Spinner, NP

## 2020-11-07 ENCOUNTER — Ambulatory Visit (INDEPENDENT_AMBULATORY_CARE_PROVIDER_SITE_OTHER): Payer: MEDICAID | Admitting: Nurse Practitioner

## 2020-11-14 ENCOUNTER — Ambulatory Visit (INDEPENDENT_AMBULATORY_CARE_PROVIDER_SITE_OTHER): Payer: Medicaid Other | Admitting: Nurse Practitioner

## 2020-11-14 ENCOUNTER — Other Ambulatory Visit: Payer: Self-pay

## 2020-11-14 VITALS — BP 111/71 | HR 93 | Ht 62.0 in | Wt 135.0 lb

## 2020-11-14 DIAGNOSIS — I70222 Atherosclerosis of native arteries of extremities with rest pain, left leg: Secondary | ICD-10-CM

## 2020-11-14 DIAGNOSIS — Z89511 Acquired absence of right leg below knee: Secondary | ICD-10-CM

## 2020-11-14 NOTE — Progress Notes (Signed)
[facility]  [phone]) [phone] [phone]) [phone]  ID: Alexandra [name] OB: 03/21/1970  MR#: [id number]  [facility]#:[id number]  Patient Care Team: Center, [facility] as PCP - General (General Practice)  CHIEF COMPLAINT: P-ANCA vasculitis, iron deficiency anemia, aortic thrombus.  INTERVAL HISTORY: Patient returns to clinic today for repeat laboratory work, further evaluation, and consideration of additional IV Venofer.  She is now off prednisone for her vasculitis and continues to follow-up closely with rheumatology.  She has no neurologic complaints.  She denies any recent fevers.  She has a good appetite and denies weight loss.  She has no chest pain, shortness of breath, cough, or hemoptysis.  She denies any nausea, vomiting, constipation, or diarrhea.  She has no urinary complaints.  Patient offers no further specific complaints today.  REVIEW OF SYSTEMS:   Review of Systems  Constitutional: Negative.  Negative for fever, malaise/fatigue and weight loss.  Respiratory: Negative.  Negative for cough, hemoptysis and shortness of breath.   Cardiovascular: Negative.  Negative for chest pain and leg swelling.  Gastrointestinal: Negative.  Negative for abdominal pain.  Genitourinary: Negative.  Negative for dysuria.  Musculoskeletal: Negative.  Negative for back pain.  Skin: Negative.  Negative for rash.  Neurological: Negative.  Negative for dizziness, focal weakness, weakness and headaches.  Psychiatric/Behavioral: Negative.  The patient is not nervous/anxious.    As per HPI. Otherwise, a complete review of systems is negative.  PAST MEDICAL HISTORY: Past Medical History:  Diagnosis Date   Breast discharge 2013   present X 3 years  Left only multiple ducts per pt   Depression     PAST SURGICAL HISTORY: Past Surgical History:  Procedure Laterality Date   ABDOMINAL HYSTERECTOMY     AMPUTATION Right 09/12/2020   Procedure: AMPUTATION BELOW KNEE;   Surgeon: Annice Needy, MD;  Location: [facility] OR[facility];  Service: Vascular;  Laterality: Right;   BREAST CYST EXCISION Left 2006   ENDOVASCULAR STENT INSERTION N/A 09/08/2020   Procedure: ENDOVASCULAR STENT GRAFT INSERTION;  Surgeon: Annice Needy, MD;  Location: [facility] OR[facility];  Service: Vascular;  Laterality: N/A;   LOWER EXTREMITY ANGIOGRAPHY Right 09/01/2020   Procedure: Lower Extremity Angiography;  Surgeon: Annice Needy, MD;  Location: [facility];  Service: Cardiovascular;  Laterality: Right;   LOWER EXTREMITY ANGIOGRAPHY Left 10/01/2020   Procedure: LOWER EXTREMITY ANGIOGRAPHY;  Surgeon: Annice Needy, MD;  Location: [facility];  Service: Cardiovascular;  Laterality: Left;   LOWER EXTREMITY INTERVENTION Right 09/07/2020   Procedure: LOWER EXTREMITY INTERVENTION;  Surgeon: Nada Libman, MD;  Location: [facility];  Service: Cardiovascular;  Laterality: Right;    FAMILY HISTORY: Family History  Problem Relation Age of Onset   Breast cancer Mother 46   Breast cancer Maternal Aunt    Breast cancer Maternal Grandmother    Breast cancer Maternal Aunt        Maternal Great Aunt    Breast cancer Cousin 31    ADVANCED DIRECTIVES (Y/N):  N  HEALTH MAINTENANCE: Social History   Tobacco Use   Smoking status: Former    Packs/day: 1.00    Years: 29.00    Pack years: 29.00    Types: Cigarettes   Smokeless tobacco: Never  Vaping Use   Vaping Use: Never used  Substance Use Topics   Alcohol use: Not Currently    Comment: social   Drug use: No     Colonoscopy:  PAP:  Bone density:  Lipid panel:  No Known Allergies  Current Outpatient Medications  Medication Sig Dispense Refill   apixaban (ELIQUIS) 5 MG TABS tablet Take 1 tablet (5 mg total) by mouth 2 (two) times daily. 60 tablet 4   aspirin EC 81 MG EC tablet Take 1 tablet (81 mg total) by mouth daily. Swallow whole. 90 tablet 3   atorvastatin (LIPITOR) 40 MG tablet Take 1 tablet (40 mg total) by mouth once  daily. 30 tablet 4   cholecalciferol (VITAMIN D) 25 MCG (1000 UNIT) tablet Take 1,000 Units by mouth daily. For Vitamin D maintenance after completion of Vitamin D treatment.     citalopram (CELEXA) 40 MG tablet Take 1 tablet (40 mg total) by mouth once daily. 30 tablet 2   ferrous gluconate (FERGON) 324 MG tablet Take 1 tablet (324 mg total) by mouth 2 (two) times daily with a meal. 60 tablet 3   gabapentin (NEURONTIN) 100 MG capsule SMARTSIG:3 By Mouth Every 8 Hours PRN     gabapentin (NEURONTIN) 300 MG capsule Take 300 mg by mouth 3 (three) times daily.     HYDROcodone-acetaminophen (NORCO/VICODIN) 5-325 MG tablet Take 1-2 tablets by mouth every 6 (six) hours as needed for severe pain or moderate pain. 30 tablet 0   IRON 100/C 100-250 MG TABS SMARTSIG:1 Tablet(s) By Mouth Every 12 Hours     nicotine (NICODERM CQ - DOSED IN MG/24 HOURS) 21 mg/24hr patch Place 1 patch (21 mg total) onto the skin daily. 28 patch 0   No current facility-administered medications for this visit.    OBJECTIVE: Vitals:   11/20/20 1040  BP: 95/66  Pulse: 82  Resp: 16  Temp: 98.6 F (37 C)     There is no height or weight on file to calculate BMI.    ECOG FS:0 - Asymptomatic  General: Well-developed, well-nourished, no acute distress. Eyes: Pink conjunctiva, anicteric sclera. HEENT: Normocephalic, moist mucous membranes. Lungs: No audible wheezing or coughing. Heart: Regular rate and rhythm. Abdomen: Soft, nontender, no obvious distention. Musculoskeletal: Right BKA. Neuro: Alert, answering all questions appropriately. Cranial nerves grossly intact. Skin: No rashes or petechiae noted. Psych: Normal affect.   LAB RESULTS:  Lab Results  Component Value Date   NA 137 10/01/2020   K 4.5 10/01/2020   CL 99 10/01/2020   CO2 27 10/01/2020   GLUCOSE 117 (H) 10/01/2020   BUN 21 (H) 10/01/2020   CREATININE 0.47 10/01/2020   CALCIUM 8.7 (L) 10/01/2020   PROT 6.0 (L) 10/01/2020   ALBUMIN 3.3 (L)  10/01/2020   AST 23 10/01/2020   ALT 25 10/01/2020   ALKPHOS 75 10/01/2020   BILITOT 0.8 10/01/2020   GFRNONAA >60 10/01/2020   GFRAA >60 03/25/2017    Lab Results  Component Value Date   WBC 7.8 11/20/2020   NEUTROABS 3.9 11/20/2020   HGB 12.9 11/20/2020   HCT 40.6 11/20/2020   MCV 86.8 11/20/2020   PLT 372 11/20/2020   Lab Results  Component Value Date   IRON 80 11/20/2020   TIBC 365 11/20/2020   IRONPCTSAT 22 11/20/2020   Lab Results  Component Value Date   FERRITIN 21 11/20/2020     STUDIES: No results found.  ASSESSMENT: P-ANCA vasculitis, iron deficiency anemia, aortic thrombus.  PLAN:    1.  P ANCA vasculitis: Patient received 1 dose of IV Rituxan on September 19, 2020.  Currently, there are no plans to receive additional dosing but will defer to treatment to rheumatology.  Patient reports that  she has now tapered off prednisone.  Continue follow-up with rheumatology as scheduled. 2.  Iron deficiency anemia: Resolved.  Patient's hemoglobin and iron stores are now within normal limits. 3.  Aortic clot: Patient is currently on Eliquis and recommendation is to stay on anticoagulation lifelong. 4.  Right BKA: Continue follow-up with vascular surgery as scheduled.  Patient expressed understanding and was in agreement with this plan. She also understands that She can call clinic at any time with any questions, concerns, or complaints.     Jeralyn Ruths, MD   11/22/2020 7:05 AM

## 2020-11-17 ENCOUNTER — Encounter (INDEPENDENT_AMBULATORY_CARE_PROVIDER_SITE_OTHER): Payer: Self-pay | Admitting: Nurse Practitioner

## 2020-11-17 NOTE — Progress Notes (Signed)
Subjective:    Patient ID: Alexandra [name], female    DOB: [birth year], 50 y.o.   MRN: [id number] Chief Complaint  Patient presents with   Follow-up    2 wk wound check    Alexandra [name] is a 50 year old female that presents today for follow-up evaluation due to right below-knee amputation as well as left ischemic foot.  The patient recently underwent revascularization of her left lower extremity on 10/01/2020.  Previously the patient had a bit of dry necrosis on her great toe as well as second toe.  This sloughed off and now the skin is intact and consistent with her normal skin tone.  The right below-knee amputation site is also intact.  The Steri-Strips were removed and there is no areas of dehiscence.  She denies any new wounds or ulcerations.   Review of Systems  Skin:  Negative for wound.  All other systems reviewed and are negative.     Objective:   Physical Exam Vitals reviewed.  HENT:     Head: Normocephalic.  Cardiovascular:     Rate and Rhythm: Normal rate.     Pulses:          Dorsalis pedis pulses are 1+ on the left side.       Posterior tibial pulses are 1+ on the left side.  Pulmonary:     Effort: Pulmonary effort is normal.  Musculoskeletal:     Right Lower Extremity: Right leg is amputated below knee.  Skin:    General: Skin is warm and dry.     Capillary Refill: Capillary refill takes 2 to 3 seconds.  Neurological:     Mental Status: She is alert and oriented to person, place, and time.  Psychiatric:        Mood and Affect: Mood normal.        Behavior: Behavior normal.        Thought Content: Thought content normal.        Judgment: Judgment normal.    BP 111/71   Pulse 93   Ht 5\' 2"  (1.575 m)   Wt 135 lb (61.2 kg)   LMP 01/14/2009   BMI 24.69 kg/m   Past Medical History:  Diagnosis Date   Breast discharge 2013   present X 3 years  Left only multiple ducts per pt   Depression     Social History   Socioeconomic History   Marital status:  Divorced    Spouse name: Not on file   Number of children: Not on file   Years of education: Not on file   Highest education level: Not on file  Occupational History   Not on file  Tobacco Use   Smoking status: Former    Packs/day: 1.00    Years: 29.00    Pack years: 29.00    Types: Cigarettes   Smokeless tobacco: Never  Vaping Use   Vaping Use: Never used  Substance and Sexual Activity   Alcohol use: Not Currently    Comment: social   Drug use: No   Sexual activity: Yes  Other Topics Concern   Not on file  Social History Narrative   Not on file   Social Determinants of Health   Financial Resource Strain: Not on file  Food Insecurity: Not on file  Transportation Needs: Not on file  Physical Activity: Not on file  Stress: Not on file  Social Connections: Not on file  Intimate Partner Violence: Not on file  Past Surgical History:  Procedure Laterality Date   ABDOMINAL HYSTERECTOMY     AMPUTATION Right 09/12/2020   Procedure: AMPUTATION BELOW KNEE;  Surgeon: Annice Needy, MD;  Location: [facility];  Service: Vascular;  Laterality: Right;   BREAST CYST EXCISION Left 2006   ENDOVASCULAR STENT INSERTION N/A 09/08/2020   Procedure: ENDOVASCULAR STENT GRAFT INSERTION;  Surgeon: Annice Needy, MD;  Location: [facility];  Service: Vascular;  Laterality: N/A;   LOWER EXTREMITY ANGIOGRAPHY Right 09/01/2020   Procedure: Lower Extremity Angiography;  Surgeon: Annice Needy, MD;  Location: [facility];  Service: Cardiovascular;  Laterality: Right;   LOWER EXTREMITY ANGIOGRAPHY Left 10/01/2020   Procedure: LOWER EXTREMITY ANGIOGRAPHY;  Surgeon: Annice Needy, MD;  Location: [facility];  Service: Cardiovascular;  Laterality: Left;   LOWER EXTREMITY INTERVENTION Right 09/07/2020   Procedure: LOWER EXTREMITY INTERVENTION;  Surgeon: Nada Libman, MD;  Location: [facility];  Service: Cardiovascular;  Laterality: Right;    Family History  Problem Relation Age of  Onset   Breast cancer Mother 41   Breast cancer Maternal Aunt    Breast cancer Maternal Grandmother    Breast cancer Maternal Aunt        Maternal Great Aunt    Breast cancer Cousin 31    No Known Allergies  CBC Latest Ref Rng & Units 10/01/2020 09/19/2020 09/15/2020  WBC 4.0 - 10.5 K/uL 16.2(H) 13.2(H) 19.8(H)  Hemoglobin 12.0 - 15.0 g/dL 8.3(M) 1.9(Q) 2.2(W)  Hematocrit 36.0 - 46.0 % 31.4(L) 27.8(L) 30.9(L)  Platelets 150 - 400 K/uL 382 427(H) 451(H)      CMP     Component Value Date/Time   NA 137 10/01/2020 1558   NA 133 (L) 11/08/2013 1424   K 4.5 10/01/2020 1558   K 3.7 11/08/2013 1424   CL 99 10/01/2020 1558   CL 100 11/08/2013 1424   CO2 27 10/01/2020 1558   CO2 23 11/08/2013 1424   GLUCOSE 117 (H) 10/01/2020 1558   GLUCOSE 92 11/08/2013 1424   BUN 21 (H) 10/01/2020 1558   BUN 10 11/08/2013 1424   CREATININE 0.47 10/01/2020 1558   CREATININE 0.73 11/08/2013 1424   CALCIUM 8.7 (L) 10/01/2020 1558   CALCIUM 8.3 (L) 11/08/2013 1424   PROT 6.0 (L) 10/01/2020 1558   PROT 7.1 09/17/2013 0853   ALBUMIN 3.3 (L) 10/01/2020 1558   ALBUMIN 3.3 (L) 09/17/2013 0853   AST 23 10/01/2020 1558   AST 22 09/17/2013 0853   ALT 25 10/01/2020 1558   ALT 21 09/17/2013 0853   ALKPHOS 75 10/01/2020 1558   ALKPHOS 76 09/17/2013 0853   BILITOT 0.8 10/01/2020 1558   BILITOT 0.1 (L) 09/17/2013 0853   GFRNONAA >60 10/01/2020 1558   GFRNONAA >60 11/08/2013 1424   GFRAA >60 03/25/2017 1955   GFRAA >60 11/08/2013 1424     VAS [place] ABI WITH/WO TBI  Result Date: 09/30/2020  LOWER EXTREMITY DOPPLER STUDY Patient Name:  Alexandra [name]  Date of Exam:   09/29/2020 Medical Rec #: [id number]        Accession #:    [id number] Date of Birth: Aug 20, 1970        Patient Gender: F Patient Age:   37Y Exam Location:  [facility] Procedure:      VAS [place] ABI WITH/WO TBI Referring Phys: [id number] JASON S DEW --------------------------------------------------------------------------------   Indications: Peripheral artery disease, and right BKA and Left great and second  toe pain and discoloration.  Performing Technologist: Reece AgarValarie Baldwin RT (R)(VS)  Examination Guidelines: A complete evaluation includes at minimum, Doppler waveform signals and systolic blood pressure reading at the level of bilateral brachial, anterior tibial, and posterior tibial arteries, when vessel segments are accessible. Bilateral testing is considered an integral part of a complete examination. Photoelectric Plethysmograph (PPG) waveforms and toe systolic pressure readings are included as required and additional duplex testing as needed. Limited examinations for reoccurring indications may be performed as noted.  ABI Findings: +--------+------------------+-----+--------+--------+ Right   Rt Pressure (mmHg)IndexWaveformComment  +--------+------------------+-----+--------+--------+ Brachial120                            BKA      +--------+------------------+-----+--------+--------+ +---------+------------------+-----+----------+-------+ Left     Lt Pressure (mmHg)IndexWaveform  Comment +---------+------------------+-----+----------+-------+ Brachial 129                                      +---------+------------------+-----+----------+-------+ ATA      69                0.53 monophasic        +---------+------------------+-----+----------+-------+ PTA      75                0.58 monophasic        +---------+------------------+-----+----------+-------+ Great Toe0                 0.00                   +---------+------------------+-----+----------+-------+ Summary: Left: Resting left ankle-brachial index indicates moderate left lower extremity arterial disease. The left toe-brachial index is abnormal. No flow detected in the left great toe.  *See table(s) above for measurements and observations.  Electronically signed by Festus BarrenJason Dew MD on 09/30/2020 at 1:32:04 PM.   Final         Assessment & Plan:   1. Atherosclerosis of native artery of left lower extremity with rest pain (HCC) The previous discoloration has resolved.  As suspected the skin sloughed off leaving intact skin.  The patient continues to have warm palpable skin.  We will continue with close follow-up and have the patient return in 4 weeks with noninvasive studies.  Patient is advised to follow-up sooner if issues arise.  2. Hx of BKA, right (HCC) Wynonia LawmanWendy Signor was seen for further evaluation for right below knee prosthesis.  She is a woman who had amputation on 09/12/2020.  At the time he is healing well and ready for fitting of new right below knee prosthesis.  He is a highly motivated individual and should do well once fitted with prosthesis.  She has no problem returning to a K3 ambulator, which will allow her to walk inside and outside of her home and overload level barriers.   Current Outpatient Medications on File Prior to Visit  Medication Sig Dispense Refill   apixaban (ELIQUIS) 5 MG TABS tablet Take 1 tablet (5 mg total) by mouth 2 (two) times daily. 60 tablet 4   aspirin EC 81 MG EC tablet Take 1 tablet (81 mg total) by mouth daily. Swallow whole. 90 tablet 3   atorvastatin (LIPITOR) 40 MG tablet Take 1 tablet (40 mg total) by mouth once daily. 30 tablet 4   cholecalciferol (VITAMIN D) 25 MCG (1000 UNIT) tablet Take 1,000 Units by mouth daily. For Vitamin  D maintenance after completion of Vitamin D treatment.     citalopram (CELEXA) 40 MG tablet Take 1 tablet (40 mg total) by mouth once daily. 30 tablet 2   ferrous gluconate (FERGON) 324 MG tablet Take 1 tablet (324 mg total) by mouth 2 (two) times daily with a meal. 60 tablet 3   gabapentin (NEURONTIN) 100 MG capsule SMARTSIG:3 By Mouth Every 8 Hours PRN     gabapentin (NEURONTIN) 300 MG capsule Take 300 mg by mouth 3 (three) times daily.     HYDROcodone-acetaminophen (NORCO/VICODIN) 5-325 MG tablet Take 1-2 tablets by mouth every 6 (six) hours  as needed for severe pain or moderate pain. 30 tablet 0   IRON 100/C 100-250 MG TABS SMARTSIG:1 Tablet(s) By Mouth Every 12 Hours     nicotine (NICODERM CQ - DOSED IN MG/24 HOURS) 21 mg/24hr patch Place 1 patch (21 mg total) onto the skin daily. 28 patch 0   predniSONE (DELTASONE) 10 MG tablet Take by mouth.     predniSONE (DELTASONE) 20 MG tablet Take 2 tablets (40 mg total) by mouth daily before breakfast. 30 tablet 0   [DISCONTINUED] albuterol (PROVENTIL HFA;VENTOLIN HFA) 108 (90 Base) MCG/ACT inhaler Inhale 2 puffs into the lungs every 6 (six) hours as needed. 1 Inhaler 0   No current facility-administered medications on file prior to visit.    There are no Patient Instructions on file for this visit. No follow-ups on file.   Georgiana Spinner, NP

## 2020-11-20 ENCOUNTER — Other Ambulatory Visit: Payer: Self-pay

## 2020-11-20 ENCOUNTER — Inpatient Hospital Stay: Payer: Medicaid Other

## 2020-11-20 ENCOUNTER — Inpatient Hospital Stay: Payer: Medicaid Other | Attending: Oncology

## 2020-11-20 ENCOUNTER — Inpatient Hospital Stay (HOSPITAL_BASED_OUTPATIENT_CLINIC_OR_DEPARTMENT_OTHER): Payer: Medicaid Other | Admitting: Oncology

## 2020-11-20 ENCOUNTER — Encounter: Payer: Self-pay | Admitting: Oncology

## 2020-11-20 VITALS — BP 95/66 | HR 82 | Temp 98.6°F | Resp 16

## 2020-11-20 DIAGNOSIS — D509 Iron deficiency anemia, unspecified: Secondary | ICD-10-CM

## 2020-11-20 DIAGNOSIS — I776 Arteritis, unspecified: Secondary | ICD-10-CM

## 2020-11-20 DIAGNOSIS — Z7982 Long term (current) use of aspirin: Secondary | ICD-10-CM | POA: Insufficient documentation

## 2020-11-20 DIAGNOSIS — Z87891 Personal history of nicotine dependence: Secondary | ICD-10-CM | POA: Insufficient documentation

## 2020-11-20 DIAGNOSIS — Z7901 Long term (current) use of anticoagulants: Secondary | ICD-10-CM | POA: Diagnosis not present

## 2020-11-20 DIAGNOSIS — Z79899 Other long term (current) drug therapy: Secondary | ICD-10-CM | POA: Insufficient documentation

## 2020-11-20 DIAGNOSIS — I7789 Other specified disorders of arteries and arterioles: Secondary | ICD-10-CM | POA: Diagnosis not present

## 2020-11-20 DIAGNOSIS — Z89511 Acquired absence of right leg below knee: Secondary | ICD-10-CM | POA: Insufficient documentation

## 2020-11-20 LAB — CBC WITH DIFFERENTIAL/PLATELET
Abs Immature Granulocytes: 0.03 10*3/uL (ref 0.00–0.07)
Basophils Absolute: 0.1 10*3/uL (ref 0.0–0.1)
Basophils Relative: 1 %
Eosinophils Absolute: 0.5 10*3/uL (ref 0.0–0.5)
Eosinophils Relative: 6 %
HCT: 40.6 % (ref 36.0–46.0)
Hemoglobin: 12.9 g/dL (ref 12.0–15.0)
Immature Granulocytes: 0 %
Lymphocytes Relative: 35 %
Lymphs Abs: 2.7 10*3/uL (ref 0.7–4.0)
MCH: 27.6 pg (ref 26.0–34.0)
MCHC: 31.8 g/dL (ref 30.0–36.0)
MCV: 86.8 fL (ref 80.0–100.0)
Monocytes Absolute: 0.7 10*3/uL (ref 0.1–1.0)
Monocytes Relative: 8 %
Neutro Abs: 3.9 10*3/uL (ref 1.7–7.7)
Neutrophils Relative %: 50 %
Platelets: 372 10*3/uL (ref 150–400)
RBC: 4.68 MIL/uL (ref 3.87–5.11)
RDW: 15.9 % — ABNORMAL HIGH (ref 11.5–15.5)
WBC: 7.8 10*3/uL (ref 4.0–10.5)
nRBC: 0 % (ref 0.0–0.2)

## 2020-11-20 LAB — IRON AND TIBC
Iron: 80 ug/dL (ref 28–170)
Saturation Ratios: 22 % (ref 10.4–31.8)
TIBC: 365 ug/dL (ref 250–450)
UIBC: 285 ug/dL

## 2020-11-20 LAB — FERRITIN: Ferritin: 21 ng/mL (ref 11–307)

## 2020-11-20 NOTE — Progress Notes (Signed)
Patient here for follow up no complaints today. 

## 2020-11-22 ENCOUNTER — Encounter: Payer: Self-pay | Admitting: Oncology

## 2020-11-28 ENCOUNTER — Telehealth (INDEPENDENT_AMBULATORY_CARE_PROVIDER_SITE_OTHER): Payer: Self-pay | Admitting: Nurse Practitioner

## 2020-11-28 NOTE — Telephone Encounter (Signed)
I was able to give it to [facility] yesterday

## 2020-11-28 NOTE — Telephone Encounter (Signed)
Faxed to [facility] yesterday 11/27/20

## 2020-12-16 ENCOUNTER — Other Ambulatory Visit (INDEPENDENT_AMBULATORY_CARE_PROVIDER_SITE_OTHER): Payer: Self-pay | Admitting: Nurse Practitioner

## 2020-12-16 DIAGNOSIS — I70222 Atherosclerosis of native arteries of extremities with rest pain, left leg: Secondary | ICD-10-CM

## 2020-12-19 ENCOUNTER — Encounter (INDEPENDENT_AMBULATORY_CARE_PROVIDER_SITE_OTHER): Payer: MEDICAID

## 2020-12-19 ENCOUNTER — Ambulatory Visit (INDEPENDENT_AMBULATORY_CARE_PROVIDER_SITE_OTHER): Payer: MEDICAID | Admitting: Nurse Practitioner

## 2021-01-02 ENCOUNTER — Other Ambulatory Visit: Payer: Self-pay

## 2021-01-02 ENCOUNTER — Encounter (INDEPENDENT_AMBULATORY_CARE_PROVIDER_SITE_OTHER): Payer: Self-pay | Admitting: Nurse Practitioner

## 2021-01-02 ENCOUNTER — Ambulatory Visit (INDEPENDENT_AMBULATORY_CARE_PROVIDER_SITE_OTHER): Payer: Medicaid Other | Admitting: Nurse Practitioner

## 2021-01-02 ENCOUNTER — Ambulatory Visit (INDEPENDENT_AMBULATORY_CARE_PROVIDER_SITE_OTHER): Payer: Medicaid Other

## 2021-01-02 VITALS — BP 107/73 | HR 82 | Resp 16

## 2021-01-02 DIAGNOSIS — I70222 Atherosclerosis of native arteries of extremities with rest pain, left leg: Secondary | ICD-10-CM | POA: Diagnosis not present

## 2021-01-02 DIAGNOSIS — F17200 Nicotine dependence, unspecified, uncomplicated: Secondary | ICD-10-CM

## 2021-01-03 ENCOUNTER — Encounter (INDEPENDENT_AMBULATORY_CARE_PROVIDER_SITE_OTHER): Payer: Self-pay | Admitting: Nurse Practitioner

## 2021-01-03 NOTE — Progress Notes (Signed)
Subjective:    Patient ID: Alexandra [name], female    DOB: 29-May-1970, 50 y.o.   MRN: [id number] Chief Complaint  Patient presents with   Follow-up    Ultrasound follow up      [name] Record is a 50 year old female that presents today for follow-up evaluation due to right below-knee amputation as well as left ischemic foot.  The stump is doing well the patient currently has a shrinker and should be receiving her prosthetic soon.  The patient also notes her toes are not had any discoloration although there is some occasional tingling.  She also has some thigh numbness from her surgery.  Overall the patient is progressing well.  There is no complaints of any claudication or rest pain or new wounds or ulcerations.  Today noninvasive studies show an ABI of 1.12 on the left with triphasic tibial artery waveforms with slightly dampened toe waveforms.      Review of Systems  Musculoskeletal:  Positive for gait problem.  Skin:  Negative for wound.  All other systems reviewed and are negative.     Objective:   Physical Exam Vitals reviewed.  HENT:     Head: Normocephalic.  Cardiovascular:     Rate and Rhythm: Normal rate.     Pulses:          Dorsalis pedis pulses are 1+ on the left side.  Pulmonary:     Effort: Pulmonary effort is normal.  Neurological:     Mental Status: She is alert and oriented to person, place, and time.     Gait: Gait abnormal.  Psychiatric:        Mood and Affect: Mood normal.        Thought Content: Thought content normal.        Judgment: Judgment normal.    BP 107/73 (BP Location: Right Arm)   Pulse 82   Resp 16   LMP 01/14/2009   Past Medical History:  Diagnosis Date   Breast discharge 2013   present X 3 years  Left only multiple ducts per pt   Depression     Social History   Socioeconomic History   Marital status: Divorced    Spouse name: Not on file   Number of children: Not on file   Years of education: Not on file   Highest  education level: Not on file  Occupational History   Not on file  Tobacco Use   Smoking status: Former    Packs/day: 1.00    Years: 29.00    Pack years: 29.00    Types: Cigarettes   Smokeless tobacco: Never  Vaping Use   Vaping Use: Never used  Substance and Sexual Activity   Alcohol use: Not Currently    Comment: social   Drug use: No   Sexual activity: Yes  Other Topics Concern   Not on file  Social History Narrative   Not on file   Social Determinants of Health   Financial Resource Strain: Not on file  Food Insecurity: Not on file  Transportation Needs: Not on file  Physical Activity: Not on file  Stress: Not on file  Social Connections: Not on file  Intimate Partner Violence: Not on file    Past Surgical History:  Procedure Laterality Date   ABDOMINAL HYSTERECTOMY     AMPUTATION Right 09/12/2020   Procedure: AMPUTATION BELOW KNEE;  Surgeon: Annice Needy, MD;  Location: [facility];  Service: Vascular;  Laterality: Right;   BREAST  CYST EXCISION Left 2006   ENDOVASCULAR STENT INSERTION N/A 09/08/2020   Procedure: ENDOVASCULAR STENT GRAFT INSERTION;  Surgeon: Annice Needy, MD;  Location: [facility];  Service: Vascular;  Laterality: N/A;   LOWER EXTREMITY ANGIOGRAPHY Right 09/01/2020   Procedure: Lower Extremity Angiography;  Surgeon: Annice Needy, MD;  Location: [facility];  Service: Cardiovascular;  Laterality: Right;   LOWER EXTREMITY ANGIOGRAPHY Left 10/01/2020   Procedure: LOWER EXTREMITY ANGIOGRAPHY;  Surgeon: Annice Needy, MD;  Location: [facility];  Service: Cardiovascular;  Laterality: Left;   LOWER EXTREMITY INTERVENTION Right 09/07/2020   Procedure: LOWER EXTREMITY INTERVENTION;  Surgeon: Nada Libman, MD;  Location: [facility];  Service: Cardiovascular;  Laterality: Right;    Family History  Problem Relation Age of Onset   Breast cancer Mother 61   Breast cancer Maternal Aunt    Breast cancer Maternal Grandmother    Breast  cancer Maternal Aunt        Maternal Great Aunt    Breast cancer Cousin 31    No Known Allergies  CBC Latest Ref Rng & Units 11/20/2020 10/01/2020 09/19/2020  WBC 4.0 - 10.5 K/uL 7.8 16.2(H) 13.2(H)  Hemoglobin 12.0 - 15.0 g/dL 16.1 0.9(U) 0.4(V)  Hematocrit 36.0 - 46.0 % 40.6 31.4(L) 27.8(L)  Platelets 150 - 400 K/uL 372 382 427(H)      CMP     Component Value Date/Time   NA 137 10/01/2020 1558   NA 133 (L) 11/08/2013 1424   K 4.5 10/01/2020 1558   K 3.7 11/08/2013 1424   CL 99 10/01/2020 1558   CL 100 11/08/2013 1424   CO2 27 10/01/2020 1558   CO2 23 11/08/2013 1424   GLUCOSE 117 (H) 10/01/2020 1558   GLUCOSE 92 11/08/2013 1424   BUN 21 (H) 10/01/2020 1558   BUN 10 11/08/2013 1424   CREATININE 0.47 10/01/2020 1558   CREATININE 0.73 11/08/2013 1424   CALCIUM 8.7 (L) 10/01/2020 1558   CALCIUM 8.3 (L) 11/08/2013 1424   PROT 6.0 (L) 10/01/2020 1558   PROT 7.1 09/17/2013 0853   ALBUMIN 3.3 (L) 10/01/2020 1558   ALBUMIN 3.3 (L) 09/17/2013 0853   AST 23 10/01/2020 1558   AST 22 09/17/2013 0853   ALT 25 10/01/2020 1558   ALT 21 09/17/2013 0853   ALKPHOS 75 10/01/2020 1558   ALKPHOS 76 09/17/2013 0853   BILITOT 0.8 10/01/2020 1558   BILITOT 0.1 (L) 09/17/2013 0853   GFRNONAA >60 10/01/2020 1558   GFRNONAA >60 11/08/2013 1424   GFRAA >60 03/25/2017 1955   GFRAA >60 11/08/2013 1424     No results found.     Assessment & Plan:   1. Atherosclerosis of native artery of left leg with rest pain (HCC)  Recommend:  The patient has evidence of atherosclerosis of the lower extremities with claudication.  The patient does not voice lifestyle limiting changes at this point in time.  Noninvasive studies do not suggest clinically significant change.  No invasive studies, angiography or surgery at this time The patient should continue walking and begin a more formal exercise program.  The patient should continue antiplatelet therapy and aggressive treatment of the lipid  abnormalities  No changes in the patient's medications at this time  The patient should continue wearing graduated compression socks 10-15 mmHg strength to control the mild edema.    Patient will follow up in 6 months with noninvasive studies or sooner if issues arise. 2. Tobacco use disorder  The patient continues to abstain.   Current Outpatient Medications on File Prior to Visit  Medication Sig Dispense Refill   apixaban (ELIQUIS) 5 MG TABS tablet Take 1 tablet (5 mg total) by mouth 2 (two) times daily. 60 tablet 4   aspirin EC 81 MG EC tablet Take 1 tablet (81 mg total) by mouth daily. Swallow whole. 90 tablet 3   atorvastatin (LIPITOR) 40 MG tablet Take 1 tablet (40 mg total) by mouth once daily. 30 tablet 4   cholecalciferol (VITAMIN D) 25 MCG (1000 UNIT) tablet Take 1,000 Units by mouth daily. For Vitamin D maintenance after completion of Vitamin D treatment.     citalopram (CELEXA) 40 MG tablet Take 1 tablet (40 mg total) by mouth once daily. 30 tablet 2   ferrous gluconate (FERGON) 324 MG tablet Take 1 tablet (324 mg total) by mouth 2 (two) times daily with a meal. 60 tablet 3   gabapentin (NEURONTIN) 100 MG capsule SMARTSIG:3 By Mouth Every 8 Hours PRN     gabapentin (NEURONTIN) 300 MG capsule Take 300 mg by mouth 3 (three) times daily.     HYDROcodone-acetaminophen (NORCO/VICODIN) 5-325 MG tablet Take 1-2 tablets by mouth every 6 (six) hours as needed for severe pain or moderate pain. 30 tablet 0   IRON 100/C 100-250 MG TABS SMARTSIG:1 Tablet(s) By Mouth Every 12 Hours     nicotine (NICODERM CQ - DOSED IN MG/24 HOURS) 21 mg/24hr patch Place 1 patch (21 mg total) onto the skin daily. 28 patch 0   [DISCONTINUED] albuterol (PROVENTIL HFA;VENTOLIN HFA) 108 (90 Base) MCG/ACT inhaler Inhale 2 puffs into the lungs every 6 (six) hours as needed. 1 Inhaler 0   No current facility-administered medications on file prior to visit.    There are no Patient Instructions on file for this  visit. No follow-ups on file.   Georgiana Spinner, NP

## 2021-01-14 ENCOUNTER — Emergency Department: Payer: Medicaid Other

## 2021-01-14 ENCOUNTER — Encounter: Payer: Self-pay | Admitting: *Deleted

## 2021-01-14 ENCOUNTER — Other Ambulatory Visit: Payer: Self-pay

## 2021-01-14 DIAGNOSIS — Z5321 Procedure and treatment not carried out due to patient leaving prior to being seen by health care provider: Secondary | ICD-10-CM | POA: Insufficient documentation

## 2021-01-14 DIAGNOSIS — R079 Chest pain, unspecified: Secondary | ICD-10-CM | POA: Diagnosis not present

## 2021-01-14 DIAGNOSIS — R110 Nausea: Secondary | ICD-10-CM | POA: Diagnosis not present

## 2021-01-14 LAB — BASIC METABOLIC PANEL
Anion gap: 9 (ref 5–15)
BUN: 14 mg/dL (ref 6–20)
CO2: 28 mmol/L (ref 22–32)
Calcium: 9.1 mg/dL (ref 8.9–10.3)
Chloride: 100 mmol/L (ref 98–111)
Creatinine, Ser: 0.48 mg/dL (ref 0.44–1.00)
GFR, Estimated: >60 mL/min (ref >60)
Glucose, Bld: 115 mg/dL — ABNORMAL HIGH (ref 70–99)
Potassium: 3.7 mmol/L (ref 3.5–5.1)
Sodium: 137 mmol/L (ref 135–145)

## 2021-01-14 LAB — CBC
HCT: 42.3 % (ref 36.0–46.0)
Hemoglobin: 13.9 g/dL (ref 12.0–15.0)
MCH: 27.9 pg (ref 26.0–34.0)
MCHC: 32.9 g/dL (ref 30.0–36.0)
MCV: 84.8 fL (ref 80.0–100.0)
Platelets: 351 10*3/uL (ref 150–400)
RBC: 4.99 MIL/uL (ref 3.87–5.11)
RDW: 14.5 % (ref 11.5–15.5)
WBC: 7.5 10*3/uL (ref 4.0–10.5)
nRBC: 0 % (ref 0.0–0.2)

## 2021-01-14 LAB — TROPONIN I (HIGH SENSITIVITY): Troponin I (High Sensitivity): 4 ng/L (ref <18)

## 2021-01-14 NOTE — ED Triage Notes (Signed)
Pt reports left side chest pain radiating to left arm.  Sx began yesterday.  Pt has nausea.  Pt is right BKA.  Pt alert  speech clear.

## 2021-01-15 ENCOUNTER — Emergency Department
Admission: EM | Admit: 2021-01-15 | Discharge: 2021-01-15 | Disposition: A | Discharge status: Home / Self Care | Payer: Medicaid Other | Attending: Emergency Medicine | Admitting: Emergency Medicine

## 2021-01-15 LAB — HEPATIC FUNCTION PANEL
ALT: 23 U/L (ref 0–44)
AST: 22 U/L (ref 15–41)
Albumin: 3.8 g/dL (ref 3.5–5.0)
Alkaline Phosphatase: 93 U/L (ref 38–126)
Bilirubin, Direct: <0.1 mg/dL (ref 0.0–0.2)
Total Bilirubin: 0.6 mg/dL (ref 0.3–1.2)
Total Protein: 6.9 g/dL (ref 6.5–8.1)

## 2021-01-15 LAB — LIPASE, BLOOD: Lipase: 39 U/L (ref 11–51)

## 2021-01-15 NOTE — ED Notes (Signed)
No answer when called several times from lobby 

## 2021-01-15 NOTE — ED Notes (Signed)
No answre when called several times from lobby

## 2021-01-27 ENCOUNTER — Other Ambulatory Visit (INDEPENDENT_AMBULATORY_CARE_PROVIDER_SITE_OTHER): Payer: Self-pay | Admitting: Nurse Practitioner

## 2021-01-27 DIAGNOSIS — Z89511 Acquired absence of right leg below knee: Secondary | ICD-10-CM

## 2021-02-03 ENCOUNTER — Ambulatory Visit: Payer: Medicaid Other | Admitting: Physical Therapy

## 2021-02-10 ENCOUNTER — Other Ambulatory Visit: Payer: Self-pay

## 2021-02-10 ENCOUNTER — Encounter: Payer: Self-pay | Admitting: Physical Therapy

## 2021-02-10 ENCOUNTER — Ambulatory Visit: Payer: Medicaid Other | Attending: Vascular Surgery | Admitting: Physical Therapy

## 2021-02-10 DIAGNOSIS — R262 Difficulty in walking, not elsewhere classified: Secondary | ICD-10-CM | POA: Insufficient documentation

## 2021-02-10 DIAGNOSIS — R2689 Other abnormalities of gait and mobility: Secondary | ICD-10-CM | POA: Diagnosis present

## 2021-02-10 DIAGNOSIS — Z89511 Acquired absence of right leg below knee: Secondary | ICD-10-CM | POA: Diagnosis present

## 2021-02-10 DIAGNOSIS — M6281 Muscle weakness (generalized): Secondary | ICD-10-CM | POA: Diagnosis present

## 2021-02-10 NOTE — Therapy (Signed)
[facility] [address] [place], [place], [address] Phone: [phone]   Fax:  [phone]  Physical Therapy Evaluation  Patient Details  Name: Alexandra [name] MRN: [id number] Date of Birth: 09/29/1970 Referring Provider (PT): Dominica Severin, MD  Encounter Date: 02/10/2021   PT End of Session - 02/10/21 1653     Visit Number 1    Number of Visits 25    Date for PT Re-Evaluation 05/05/21    Authorization Type Medicaid, visit limi 27 max combined PT/OT/Speech per year    Authorization Time Period Auth pending    Progress Note Due on Visit 10    PT Start Time 1524    PT Stop Time 1620    PT Time Calculation (min) 56 min    Equipment Utilized During Treatment Gait belt   Wheelchair for community-level mobility, R below knee prosthesis (K3)   Activity Tolerance Patient tolerated treatment well    Behavior During Therapy WFL for tasks assessed/performed             Past Medical History:  Diagnosis Date   Breast discharge 2013   present X 3 years  Left only multiple ducts per pt   Depression    Rheumatoid arthritis ([place])    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 ([place])     Past Surgical History:  Procedure Laterality Date   ABDOMINAL HYSTERECTOMY     AMPUTATION Right 09/12/2020   Procedure: AMPUTATION BELOW KNEE;  Surgeon: Algernon Huxley, MD;  Location: [facility];  Service: Vascular;  Laterality: Right;   BREAST CYST EXCISION Left 2006   ENDOVASCULAR STENT INSERTION N/A 09/08/2020   Procedure: ENDOVASCULAR STENT GRAFT INSERTION;  Surgeon: Algernon Huxley, MD;  Location: [facility];  Service: Vascular;  Laterality: N/A;   LOWER EXTREMITY ANGIOGRAPHY Right 09/01/2020   Procedure: Lower Extremity Angiography;  Surgeon: Algernon Huxley, MD;  Location: [place] [facility];  Service: Cardiovascular;  Laterality: Right;   LOWER EXTREMITY ANGIOGRAPHY Left 10/01/2020   Procedure: LOWER EXTREMITY ANGIOGRAPHY;  Surgeon: Algernon Huxley, MD;   Location: [place] [facility];  Service: Cardiovascular;  Laterality: Left;   LOWER EXTREMITY INTERVENTION Right 09/07/2020   Procedure: LOWER EXTREMITY INTERVENTION;  Surgeon: Serafina Mitchell, MD;  Location: [place] [facility];  Service: Cardiovascular;  Laterality: Right;    There were no vitals filed for this visit.    Subjective Assessment - 02/10/21 1705     Subjective 50 year old female s/p R BKA    Pertinent History Pt is a 50 year old female s/p BKA 09/12/2020. Amputation was necessitated following gangrene of R lower limb with history of peripheral arterial disease and osteomyelitis of R fibula. Patient underwent the following vascular procedures in July 2022: Mechanical thrombectomy to the left iliac artery and distal aorta with the penumbra CAT 7D catheter as well as mechanical thrombectomy of the right common femoral artery, angioplasty of the aorta to expand the lifestream stent to 14 mm, stent placement to bilateral common iliac arteries up into the distal aorta with 8 mm diameter by 58 mm length lifestream stents, StarClose closure device bilateral femoral arteries. Pt received her R below knee prosthesis on 01/30/21 and was taught how to don/doff her prosthesis and prosthesis components. Patient reports no post-operative complications. She reports doing well with skin care for her residual limb. Patient had a chance to try on her prosthesis. She has worked on standing at [employer] sink with her prosthesis on. Patient reports  she does want to be able to drive again, complete yard work. Patient reports some phantom limb pain early post-operatively. This happens seldom at this time. Patient reports she has gravel driveway to get to her home. Patient has 3 stairs to get into her home. Patient reports her home is on one level. Patient has to scoot laterally/sidestep in her bathroom given that AD cannot fit in her bathroom. Patient reports no other hazards in her home presently.     Limitations Walking;Standing;House hold activities    Diagnostic tests Angiography, abdominal aorta    Patient Stated Goals Able to drive, able to get around with prosthesis independently    Currently in Pain? No/denies                [facility] PT Assessment - 02/10/21 1717       Assessment   Medical Diagnosis R BKA    Referring Provider (PT) Dominica Severin, MD    Onset Date/Surgical Date 09/12/20    Next MD Visit Not stated    Prior Therapy No PT for present condition      Precautions   Precaution Comments R below knee prosthesis      Prior Function   Level of Independence Independent      Cognition   Overall Cognitive Status Within Functional Limits for tasks assessed              SUBJECTIVE Type of amputation: R BKA Onset: Pt is a 50 year old female s/p BKA 09/12/2020. Amputation was necessitated following gangrene of R lower limb with history of peripheral arterial disease and osteomyelitis of R fibula. Patient underwent the following vascular procedures in July 2022: Mechanical thrombectomy to the left iliac artery and distal aorta with the penumbra CAT 7D catheter as well as mechanical thrombectomy of the right common femoral artery, angioplasty of the aorta to expand the lifestream stent to 14 mm, stent placement to bilateral common iliac arteries up into the distal aorta with 8 mm diameter by 58 mm length lifestream stents, StarClose closure device bilateral femoral arteries. Pt received her R below knee prosthesis on 01/30/21 and was taught how to don/doff her prosthesis and prosthesis components. Patient reports no post-operative complications. She reports doing well with skin care for her residual limb. Patient had a chance to try on her prosthesis. She has worked on standing at [employer] sink with her prosthesis on. Patient reports she does want to be able to drive again, complete yard work. Patient reports some phantom limb pain early post-operatively. This happens seldom  at this time. Patient reports she has gravel driveway to get to her home. Patient has 3 stairs to get into her home. Patient reports her home is on one level. Patient has to scoot laterally/sidestep in her bathroom given that AD cannot fit in her bathroom. Patient reports no other hazards in her home presently.  Referring Dx: Virginia Rochester MD: Dominica Severin, MD Pain: 0/10 Present, 0/10 Best, 8/10 Worst: Phantom pain: Yes, feeling of R toe paresthesias Pain quality: pain quality: tingling Patient goals: Able to drive, able to get around with prosthesis independently    OBJECTIVE  MUSCULOSKELETAL: Tremor: Absent Tone: Normal, no spasticity, rigidity, or clonus Residual limb appears well-healing with no signs of erythema, infection, or drainage. Patient has mild abrasion in popliteal space from her old shrinker (currently donning new garment for shrinking residual limb), mild scabbing along distomedial residual limb  Posture Mild posterior pelvic tilt in sitting in W/C and low  mat, R low limb rests with slight knee flexion in static sitting  Gait With below knee prosthesis: bilateral upper extremity support on parallel bars, decreased R terminal knee extension, decreased single-limb support time bilaterally   Palpation No tenderness to palpation along residual limb    FUNCTIONAL TESTS  Transfer chair to bed: Indpendent transfer stand-pivot on single-leg   Standing with prosthesis: Patient demonstrates stable standing with bilateral and unilateral upper limb support, slight R knee flexion in static standing   Strength R/L 4/4- Hip flexion 5/4 Hip extension  4/4 Hip abduction 5/5 Hip adduction 5/5 Knee extension 5/5 Knee flexion NT/4 Ankle Dorsiflexion NT/5 Ankle Plantarflexion NT/4+ Ankle Inversion NT/5 Ankle Eversion *indicates pain  AROM  Knee R/L Flexion: 110/130  Extension: -6/0 *indicates pain  Hip R/L Flexion: R WNL, L WNL Extension: RWNL L WNL Abduction: R WNL, L  WNL Internal Rotation: R 15, L 30 External Rotation: R WNL, L WNL *indicates pain   Muscle Length Hamstring length: R lacking 30 degrees, L lacking 40 degrees Quad length ([place]): R WNL, L WNL Hip Flexor length Marcello Moores): R WNL, L WNL    NEUROLOGICAL:  Mental Status Patient is oriented to person, place and time.  Recent memory is intact.  Remote memory is intact.  Attention span and concentration are intact.  Expressive speech is intact.  Patient's fund of knowledge is within normal limits for educational level.  Sensation Grossly intact to light touch bilateral LEs  Proprioception and hot/cold testing deferred on this date     TREATMENT  Therapeutic Activities - training for ambulation with prosthesis, stepping and standing/weight shifting drills to facilitate improved ability to perform standing activity on RLE prosthesis, repetitive task practice for improved performance of daily functional activities e.g. transferring  In // bars: Weight shifting, feet side-by-side; x20 alternating R/L Weight shifting, staggered stance; x10 in each position Forward stepping with reciprocal pattern with bilateral UE support on bars; to end of bars x 1   Patient education: discussed current condition and role of PT, prognosis, PT plan of care. Educated patient on signs of infection and to monitor for skin breakdown. Reviewed concepts for donning/doffing prosthesis. Reviewed and provided handout for baseline home exercise program (see Access Code below)    ASSESSMENT Pt is a pleasant 50 year old female referred status-post right below-knee amputation (DOS: 09/12/2020) secondary to PAD and gangrene. Residual limb is healing well at this time with only single area of scabbing along distomedial residual limb. Patient will benefit from further use of shrinker for shaping residual limb. Patient demonstrates independence with donning and doffing her prosthesis and is able to initiate weight  shifting and stepping in single plane today (forward steps).  PT examination reveals deficits in residual limb terminal knee extension, mild knee flexion ROM deficit, decreased hip flexor and hip abductor strength bilaterally, bilateral decreased hamstrings extensibility, decreased L ankle strength, impaired gait stability and gait deviations with R below knee prosthesis. Pt will benefit from PT services to address deficits in strength, functional mobility, and ROM in order to return to full function at home and in community with R lower limb prosthesis.       PT Short Term Goals - 02/10/21 1723       PT SHORT TERM GOAL #1   Title Pt will be independent with HEP in order to achieve sufficient residual limb ROM and increase strength in order to improve ability to ambulate and transfer for access to home and community    Baseline 02/10/21:  Baseline HEP initiated    Time 3    Period Weeks    Status New    Target Date 03/03/21      PT SHORT TERM GOAL #2   Title Patient will achieve full terminal knee extension as needed for normalized gait pattern and symmetrical step length for community-level ambulation    Baseline 02/10/21: R knee extension AROM -6    Time 3    Period Weeks    Status New    Target Date 03/03/21      PT SHORT TERM GOAL #3   Title Patient will ambulate for 150 feet with front-wheel walker modified independent with no LOB and normal below-knee prosthetic gait pattern as needed for home level mobility with assistive device to improve pt independence    Baseline 02/10/21: Pt currently using W/C for primary means of mobility    Time 6    Period Weeks    Status New    Target Date 03/24/21               PT Long Term Goals - 02/10/21 1725       PT LONG TERM GOAL #1   Title Patient will demonstrate improved function as evidenced by improvement in FOTO measure to predicted goal score for full participation in activities at home and in the community.    Baseline  02/10/21: No FOTO today, to be obtained next visit    Time 12    Period Weeks    Status New    Target Date 05/05/21      PT LONG TERM GOAL #2   Title Pt will ambulate x 300 feet modified-independent with least restrictive assistive device with no LOB good heel to toe progression and normalized prosthetic gait as needed for community-level mobility with BKA    Baseline 02/10/21: limited ambulation at this time with CGA and bilateral upper limb support    Time 12    Period Weeks    Status New    Target Date 05/05/21      PT LONG TERM GOAL #3   Title Patient will negotiate 3 steps with below-knee prosthesis, ascending and descending with unilateral upper extremity support modified-independently without loss of balance    Baseline 02/10/21: Pt negotiates steps with single-leg at this time with handrail assist; no prosthesis use for stairs yet    Time 12    Period Weeks    Status New    Target Date 05/05/21                    Plan - 02/11/21 1403     Clinical Impression Statement Pt is a pleasant 50 year old female referred status-post right below-knee amputation (DOS: 09/12/2020) secondary to PAD and gangrene. Residual limb is healing well at this time with only single area of scabbing along distomedial residual limb. Patient will benefit from further use of shrinker for shaping residual limb. Patient demonstrates independence with donning and doffing her prosthesis and is able to initiate weight shifting and stepping in single plane today (forward steps).  PT examination reveals deficits in residual limb terminal knee extension, mild knee flexion ROM deficit, decreased hip flexor and hip abductor strength bilaterally, bilateral decreased hamstrings extensibility, decreased L ankle strength, impaired gait stability and gait deviations with R below knee prosthesis. Pt will benefit from PT services to address deficits in strength, functional mobility, and ROM in order to return to full  function at home and in community with R  lower limb prosthesis.    Personal Factors and Comorbidities Comorbidity 3+;Transportation    Comorbidities RA, PAD, Hx of R fibula osteomyelitis and R BKA, Wegener's disease    Examination-Activity Limitations Bathing;Stairs;Stand;Transfers;Locomotion Level;Squat    Examination-Participation Restrictions Community Activity    Stability/Clinical Decision Making Evolving/Moderate complexity    Clinical Decision Making Moderate    Rehab Potential Good    PT Frequency 2x / week    PT Duration 12 weeks    PT Treatment/Interventions ADLs/Self Care Home Management;Cryotherapy;Electrical Stimulation;Moist Heat;Gait training;Stair training;Functional mobility training;Therapeutic activities;Therapeutic exercise;Balance training;Neuromuscular re-education;Patient/family education;Prosthetic Training;Manual techniques;Passive range of motion    PT Next Visit Plan Continue with [place] hip strengthening, LLE strengthening, R knee extension ROM, gait training with R below knee prosthesis    PT Home Exercise Plan Access Code [email or web address]    Consulted and Agree with Plan of Care Patient             Patient will benefit from skilled therapeutic intervention in order to improve the following deficits and impairments:  Abnormal gait, Decreased balance, Difficulty walking, Impaired flexibility, Pain, Decreased strength, Decreased range of motion  Visit Diagnosis: Difficulty in walking, not elsewhere classified  Muscle weakness (generalized)  Imbalance  History of below knee amputation, right [facility])     Problem List Patient Active Problem List   Diagnosis Date Noted   Ischemic leg 10/01/2020   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[place]) 09/26/2020   Vasculitic leg ulcer ([facility])    Right leg swelling    Aortic thrombus ([place]) 09/07/2020   Ischemic pain of right foot: Concern for/probable 09/07/2020   Foot pain, right 09/06/2020    Vasculitis ([place]) 09/04/2020   Iron deficiency anemia    Thrombocytosis    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   Rash of both feet 07/26/2020   Cellulitis 07/26/2020   Foot pain, bilateral 07/26/2020   Rash and nonspecific skin eruption 07/26/2020   Radiculopathy affecting upper extremity 02/16/2016   Neck pain 02/16/2016   Protrusion of cervical intervertebral disc 02/16/2016   Depression 02/16/2016   Major depressive disorder with single episode, in partial remission ([place]) 02/09/2016   Tobacco use disorder 02/09/2016   Valentina Gu, PT, DPT [id number]  Eilleen Kempf, PT 02/11/2021, 3:59 PM  [facility] [address] [place], [place], [address] Phone: [phone]   Fax:  [phone]  Name: Alexandra [name] MRN: [id number] Date of Birth: 23-Oct-1970

## 2021-02-12 ENCOUNTER — Encounter: Payer: Self-pay | Admitting: Oncology

## 2021-02-12 ENCOUNTER — Ambulatory Visit: Payer: Medicaid Other | Admitting: Physical Therapy

## 2021-02-12 NOTE — Patient Instructions (Incomplete)
°  TREATMENT   Manual Therapy - for symptom modulation, soft tissue sensitivity and mobility, joint mobility, ROM   R hip flexor TTP? PROM and gentle manual stretching for residual limb   Therapeutic Activities - training for ambulation with prosthesis, stepping and standing/weight shifting drills to facilitate improved ability to perform standing activity on RLE prosthesis, repetitive task practice for improved performance of daily functional activities e.g. transferring   In // bars: Weight shifting, feet side-by-side; x20 alternating R/L Weight shifting, staggered stance; x10 in each position Forward stepping with reciprocal pattern with bilateral UE support on bars; to end of bars x 1 Standing march; 2x10 alternating   Gait training with front-wheel walker;    Therapeutic Exercise - for improved soft tissue flexibility and extensibility as needed for ROM, improved strength as needed to improve performance of CKC activities/functional movements  Straight leg raise; x10 bilat Sidelying hip abduction; 2x10 bilat Prone hip extension, alternating; x10  LLE Single-limb bridge;  Single-limb heel raise LLE?   ASSESSMENT Patient arrives with excellent motivation to participate in physical therapy. . Patient has remaining deficits in ROM, mobility, and pain. Patient will benefit from continued skilled therapeutic intervention to address the above deficits as needed for improved function and QoL.

## 2021-02-19 ENCOUNTER — Encounter: Payer: Self-pay | Admitting: Physical Therapy

## 2021-02-19 ENCOUNTER — Other Ambulatory Visit: Payer: Self-pay

## 2021-02-19 ENCOUNTER — Ambulatory Visit: Payer: Medicaid Other | Attending: Vascular Surgery | Admitting: Physical Therapy

## 2021-02-19 DIAGNOSIS — M6281 Muscle weakness (generalized): Secondary | ICD-10-CM | POA: Diagnosis present

## 2021-02-19 DIAGNOSIS — R262 Difficulty in walking, not elsewhere classified: Secondary | ICD-10-CM | POA: Diagnosis present

## 2021-02-19 DIAGNOSIS — R2689 Other abnormalities of gait and mobility: Secondary | ICD-10-CM

## 2021-02-19 DIAGNOSIS — Z89511 Acquired absence of right leg below knee: Secondary | ICD-10-CM | POA: Diagnosis present

## 2021-02-19 NOTE — Therapy (Signed)
[facility] [address] [place], [place], [address] Phone: [phone]   Fax:  [phone]  Physical Therapy Treatment  Patient Details  Name: Alexandra [name] MRN: [id number] Date of Birth: May 04, 1970 Referring Provider (PT): Lowella Dell, MD   Encounter Date: 02/19/2021   PT End of Session - 02/23/21 0918     Visit Number 2    Number of Visits 25    Date for PT Re-Evaluation 05/05/21    Authorization Type Medicaid, visit limi 27 max combined PT/OT/Speech per year    Authorization Time Period Auth pending    Progress Note Due on Visit 10    PT Start Time 1515    PT Stop Time 1600    PT Time Calculation (min) 45 min    Equipment Utilized During Treatment Gait belt   Wheelchair for community-level mobility, R below knee prosthesis (K3)   Activity Tolerance Patient tolerated treatment well    Behavior During Therapy WFL for tasks assessed/performed             Past Medical History:  Diagnosis Date   Breast discharge 2013   present X 3 years  Left only multiple ducts per pt   Depression    Rheumatoid arthritis (HCC)    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)     Past Surgical History:  Procedure Laterality Date   ABDOMINAL HYSTERECTOMY     AMPUTATION Right 09/12/2020   Procedure: AMPUTATION BELOW KNEE;  Surgeon: Annice Needy, MD;  Location: [facility] ORS;  Service: Vascular;  Laterality: Right;   BREAST CYST EXCISION Left 2006   ENDOVASCULAR STENT INSERTION N/A 09/08/2020   Procedure: ENDOVASCULAR STENT GRAFT INSERTION;  Surgeon: Annice Needy, MD;  Location: [facility] OR[facility];  Service: Vascular;  Laterality: N/A;   LOWER EXTREMITY ANGIOGRAPHY Right 09/01/2020   Procedure: Lower Extremity Angiography;  Surgeon: Annice Needy, MD;  Location: [facility];  Service: Cardiovascular;  Laterality: Right;   LOWER EXTREMITY ANGIOGRAPHY Left 10/01/2020   Procedure: LOWER EXTREMITY ANGIOGRAPHY;  Surgeon: Annice Needy, MD;   Location: [facility] CV [facility];  Service: Cardiovascular;  Laterality: Left;   LOWER EXTREMITY INTERVENTION Right 09/07/2020   Procedure: LOWER EXTREMITY INTERVENTION;  Surgeon: Nada Libman, MD;  Location: [facility] IN[facility] CV [facility];  Service: Cardiovascular;  Laterality: Right;    There were no vitals filed for this visit.   Subjective Assessment - 02/23/21 0917     Subjective Patient reports she has performed her home exercises "some." Patient reports she has not stood with her prosthesis at home as described at her eval. Patient reports no other major changes at arrival to PT. Pt denies recent integumentary concerns along residual limb (no erythema, color change, swelling, drainage).    Pertinent History Pt is a 50 year old female s/p BKA 09/12/2020. Amputation was necessitated following gangrene of R lower limb with history of peripheral arterial disease and osteomyelitis of R fibula. Patient underwent the following vascular procedures in July 2022: Mechanical thrombectomy to the left iliac artery and distal aorta with the penumbra CAT 7D catheter as well as mechanical thrombectomy of the right common femoral artery, angioplasty of the aorta to expand the lifestream stent to 14 mm, stent placement to bilateral common iliac arteries up into the distal aorta with 8 mm diameter by 58 mm length lifestream stents, StarClose closure device bilateral femoral arteries. Pt received her R below knee prosthesis on 01/30/21 and was taught how to don/doff  her prosthesis and prosthesis components. Patient reports no post-operative complications. She reports doing well with skin care for her residual limb. Patient had a chance to try on her prosthesis. She has worked on standing at [employer] sink with her prosthesis on. Patient reports she does want to be able to drive again, complete yard work. Patient reports some phantom limb pain early post-operatively. This happens seldom at this time. Patient reports she has  gravel driveway to get to her home. Patient has 3 stairs to get into her home. Patient reports her home is on one level. Patient has to scoot laterally/sidestep in her bathroom given that AD cannot fit in her bathroom. Patient reports no other hazards in her home presently.    Limitations Walking;Standing;House hold activities    Diagnostic tests Angiography, abdominal aorta    Patient Stated Goals Able to drive, able to get around with prosthesis independently              TREATMENT   Manual Therapy - for symptom modulation, soft tissue sensitivity and mobility, joint mobility, ROM   PROM and manual stretching with R residual limb, R knee PROM and R knee extension overpressure (gentle oscillations gr III at end-range), R hip PROM within pt tolerance    Therapeutic Activities - training for ambulation with prosthesis, stepping and standing/weight shifting drills to facilitate improved ability to perform standing activity on RLE prosthesis, repetitive task practice for improved performance of daily functional activities e.g. transferring  Chair to low mat transfer without and with RLE prosthesis; contact-guard assist only    In // bars: Weight shifting, staggered stance; x20 in each position Standing march; 2x10 alternating  Forward stepping with reciprocal pattern with bilateral UE support on bars; D/B length of bars x 4   Gait with FWW; 1 lap around clinic following demonstration of use of AD; initiated with step-to pattern with gradual progression to step-through reciprocal gait with decreased gait velocity    *not today* Weight shifting, feet side-by-side; x20 alternating R/L    Therapeutic Exercise - for improved soft tissue flexibility and extensibility as needed for ROM, improved strength as needed to improve performance of CKC activities/functional movements  On low mat, without prosthesis: SLR; x10 bilaterally (for review) Sidelying Hip abduction; 2x10 bilaterally  (for review) Single limb bridge; 2x10 with LLE   Pt edu: Reviewed knee extension stretching   *next visit* With R below-knee prosthesis: Sit to stand with prosthesis     ASSESSMENT Patient arrives with excellent motivation to participate in physical therapy. She demonstrates improving R residual limb knee extension and tolerates OKC drills well today with moderate fatigue. Moderate tactile and verbal cueing needed to correct sidelying hip abduction technique (attain hip extension to neutral). She exhibits safe gait pattern in parallel bars, though her RLE with prosthesis demonstrates decreased terminal knee extension. Patient has remaining deficits in residual limb terminal knee extension, mild knee flexion ROM deficit, decreased hip flexor and hip abductor strength bilaterally, bilateral decreased hamstrings extensibility, decreased L ankle strength, impaired gait stability and gait deviations with R below knee prosthesis.. Patient will benefit from continued skilled therapeutic intervention to address the above deficits as needed for improved function and QoL.       PT Short Term Goals - 02/10/21 1723       PT SHORT TERM GOAL #1   Title Pt will be independent with HEP in order to achieve sufficient residual limb ROM and increase strength in order to improve ability to ambulate  and transfer for access to home and community    Baseline 02/10/21: Baseline HEP initiated    Time 3    Period Weeks    Status New    Target Date 03/03/21      PT SHORT TERM GOAL #2   Title Patient will achieve full terminal knee extension as needed for normalized gait pattern and symmetrical step length for community-level ambulation    Baseline 02/10/21: R knee extension AROM -6    Time 3    Period Weeks    Status New    Target Date 03/03/21      PT SHORT TERM GOAL #3   Title Patient will ambulate for 150 feet with front-wheel walker modified independent with no LOB and normal below-knee prosthetic  gait pattern as needed for home level mobility with assistive device to improve pt independence    Baseline 02/10/21: Pt currently using W/C for primary means of mobility    Time 6    Period Weeks    Status New    Target Date 03/24/21               PT Long Term Goals - 02/10/21 1725       PT LONG TERM GOAL #1   Title Patient will demonstrate improved function as evidenced by improvement in FOTO measure to predicted goal score for full participation in activities at home and in the community.    Baseline 02/10/21: No FOTO today, to be obtained next visit    Time 12    Period Weeks    Status New    Target Date 05/05/21      PT LONG TERM GOAL #2   Title Pt will ambulate x 300 feet modified-independent with least restrictive assistive device with no LOB good heel to toe progression and normalized prosthetic gait as needed for community-level mobility with BKA    Baseline 02/10/21: limited ambulation at this time with CGA and bilateral upper limb support    Time 12    Period Weeks    Status New    Target Date 05/05/21      PT LONG TERM GOAL #3   Title Patient will negotiate 3 steps with below-knee prosthesis, ascending and descending with unilateral upper extremity support modified-independently without loss of balance    Baseline 02/10/21: Pt negotiates steps with single-leg at this time with handrail assist; no prosthesis use for stairs yet    Time 12    Period Weeks    Status New    Target Date 05/05/21                   Plan - 02/23/21 [id number]     Clinical Impression Statement Patient arrives with excellent motivation to participate in physical therapy. She demonstrates improving R residual limb knee extension and tolerates OKC drills well today with moderate fatigue. Moderate tactile and verbal cueing needed to correct sidelying hip abduction technique (attain hip extension to neutral). She exhibits safe gait pattern in parallel bars, though her RLE with prosthesis  demonstrates decreased terminal knee extension. Patient has remaining deficits in residual limb terminal knee extension, mild knee flexion ROM deficit, decreased hip flexor and hip abductor strength bilaterally, bilateral decreased hamstrings extensibility, decreased L ankle strength, impaired gait stability and gait deviations with R below knee prosthesis.. Patient will benefit from continued skilled therapeutic intervention to address the above deficits as needed for improved function and QoL.    Personal Factors and Comorbidities Comorbidity  3+;Transportation    Comorbidities RA, PAD, Hx of R fibula osteomyelitis and R BKA, Wegener's disease    Examination-Activity Limitations Bathing;Stairs;Stand;Transfers;Locomotion Level;Squat    Examination-Participation Restrictions Community Activity    Stability/Clinical Decision Making Evolving/Moderate complexity    Rehab Potential Good    PT Frequency 2x / week    PT Duration 12 weeks    PT Treatment/Interventions ADLs/Self Care Home Management;Cryotherapy;Electrical Stimulation;Moist Heat;Gait training;Stair training;Functional mobility training;Therapeutic activities;Therapeutic exercise;Balance training;Neuromuscular re-education;Patient/family education;Prosthetic Training;Manual techniques;Passive range of motion    PT Next Visit Plan Continue with OKC hip strengthening, LLE strengthening, R knee extension ROM, gait training with R below knee prosthesis    PT Home Exercise Plan Access Code [email or web address]    Consulted and Agree with Plan of Care Patient             Patient will benefit from skilled therapeutic intervention in order to improve the following deficits and impairments:  Abnormal gait, Decreased balance, Difficulty walking, Impaired flexibility, Pain, Decreased strength, Decreased range of motion  Visit Diagnosis: Difficulty in walking, not elsewhere classified  Muscle weakness (generalized)  Imbalance  History of below knee  amputation, right [facility])     Problem List Patient Active Problem List   Diagnosis Date Noted   Ischemic leg 10/01/2020   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[place]) 09/26/2020   Vasculitic leg ulcer (HCC)    Right leg swelling    Aortic thrombus ([place]) 09/07/2020   Ischemic pain of right foot: Concern for/probable 09/07/2020   Foot pain, right 09/06/2020   Vasculitis ([place]) 09/04/2020   Iron deficiency anemia    Thrombocytosis    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 [place], [place], [address] Phone: [phone]   Fax:  [phone]  Name: TAYELOR FABIEN MRN: [id number] Date of Birth: 1970/04/24

## 2021-02-24 ENCOUNTER — Ambulatory Visit: Payer: Medicaid Other | Admitting: Physical Therapy

## 2021-02-26 ENCOUNTER — Ambulatory Visit: Payer: Medicaid Other | Admitting: Physical Therapy

## 2021-02-26 NOTE — Patient Instructions (Incomplete)
TREATMENT     Manual Therapy - for symptom modulation, soft tissue sensitivity and mobility, joint mobility, ROM    PROM and manual stretching with R residual limb, R knee PROM and R knee extension overpressure (gentle oscillations gr III at end-range), R hip PROM within pt tolerance     Therapeutic Activities - training for ambulation with prosthesis, stepping and standing/weight shifting drills to facilitate improved ability to perform standing activity on RLE prosthesis, repetitive task practice for improved performance of daily functional activities e.g. transferring   Chair to low mat transfer without and with RLE prosthesis; contact-guard assist only     In // bars (with R lower limb prosthesis): Standing march; 2x10 alternating  Forward stepping with reciprocal pattern with bilateral UE support on bars; D/B length of bars x 4 Side stepping; 4x D/B length of bars Semitandem stance;      Gait with FWW; 1 lap around clinic following demonstration of use of AD; initiated with step-to pattern with gradual progression to step-through reciprocal gait with decreased gait velocity       *not today* Weight shifting, staggered stance; x20 in each position Weight shifting, feet side-by-side; x20 alternating R/L       Therapeutic Exercise - for improved soft tissue flexibility and extensibility as needed for ROM, improved strength as needed to improve performance of CKC activities/functional movements   On low mat, without prosthesis: SLR; x10 bilaterally (for review) Sidelying Hip abduction; 2x10 bilaterally (for review) Single limb bridge; 2x10 with LLE    Pt edu: Reviewed knee extension stretching     *next visit* With R below-knee prosthesis: Sit to stand with prosthesis        ASSESSMENT Patient arrives with excellent motivation to participate in physical therapy. She demonstrates improving R residual limb knee extension and tolerates OKC drills well today with moderate  fatigue. Moderate tactile and verbal cueing needed to correct sidelying hip abduction technique (attain hip extension to neutral). She exhibits safe gait pattern in parallel bars, though her RLE with prosthesis demonstrates decreased terminal knee extension. Patient has remaining deficits in residual limb terminal knee extension, mild knee flexion ROM deficit, decreased hip flexor and hip abductor strength bilaterally, bilateral decreased hamstrings extensibility, decreased L ankle strength, impaired gait stability and gait deviations with R below knee prosthesis.. Patient will benefit from continued skilled therapeutic intervention to address the above deficits as needed for improved function and QoL.

## 2021-03-03 ENCOUNTER — Ambulatory Visit: Payer: Medicaid Other | Admitting: Physical Therapy

## 2021-03-05 ENCOUNTER — Ambulatory Visit: Payer: Medicaid Other | Admitting: Physical Therapy

## 2021-03-10 ENCOUNTER — Encounter: Payer: Medicaid Other | Admitting: Physical Therapy

## 2021-03-12 ENCOUNTER — Encounter: Payer: Medicaid Other | Admitting: Physical Therapy

## 2021-03-17 ENCOUNTER — Ambulatory Visit: Payer: Medicaid Other | Attending: Vascular Surgery | Admitting: Physical Therapy

## 2021-03-17 ENCOUNTER — Other Ambulatory Visit: Payer: Self-pay

## 2021-03-17 DIAGNOSIS — R2689 Other abnormalities of gait and mobility: Secondary | ICD-10-CM | POA: Diagnosis present

## 2021-03-17 DIAGNOSIS — Z89511 Acquired absence of right leg below knee: Secondary | ICD-10-CM | POA: Diagnosis present

## 2021-03-17 DIAGNOSIS — R262 Difficulty in walking, not elsewhere classified: Secondary | ICD-10-CM | POA: Diagnosis present

## 2021-03-17 DIAGNOSIS — M6281 Muscle weakness (generalized): Secondary | ICD-10-CM | POA: Insufficient documentation

## 2021-03-17 NOTE — Therapy (Signed)
[place] [facility] [address] [place], [place], [address] Phone: [phone]   Fax:  [phone]  Physical Therapy Treatment/ Physical Therapy Progress Note   Dates of reporting period  02/10/21   to   03/17/21   Patient Details  Name: Alexandra [name] MRN: [id number] Date of Birth: 30-Jun-1970 Referring Provider (PT): Dominica Severin, MD   Encounter Date: 03/17/2021   PT End of Session - 03/17/21 1518     Visit Number 3    Number of Visits 25    Date for PT Re-Evaluation 05/05/21    Authorization Type Medicaid, visit limit 27 max combined PT/OT/Speech per year    Authorization Time Period Cert 94/80/16-5/53/74; Auth not required first 12 visits    Progress Note Due on Visit 12    PT Start Time 1515    PT Stop Time 1600    PT Time Calculation (min) 45 min    Equipment Utilized During Treatment Gait belt   Wheelchair for community-level mobility, R below knee prosthesis (K3)   Activity Tolerance Patient tolerated treatment well    Behavior During Therapy WFL for tasks assessed/performed             Past Medical History:  Diagnosis Date   Breast discharge 2013   present X 3 years  Left only multiple ducts per pt   Depression    Rheumatoid arthritis ([place])    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 ([place])     Past Surgical History:  Procedure Laterality Date   ABDOMINAL HYSTERECTOMY     AMPUTATION Right 09/12/2020   Procedure: AMPUTATION BELOW KNEE;  Surgeon: Algernon Huxley, MD;  Location: [facility];  Service: Vascular;  Laterality: Right;   BREAST CYST EXCISION Left 2006   ENDOVASCULAR STENT INSERTION N/A 09/08/2020   Procedure: ENDOVASCULAR STENT GRAFT INSERTION;  Surgeon: Algernon Huxley, MD;  Location: [facility];  Service: Vascular;  Laterality: N/A;   LOWER EXTREMITY ANGIOGRAPHY Right 09/01/2020   Procedure: Lower Extremity Angiography;  Surgeon: Algernon Huxley, MD;  Location: [place] [facility];  Service: Cardiovascular;   Laterality: Right;   LOWER EXTREMITY ANGIOGRAPHY Left 10/01/2020   Procedure: LOWER EXTREMITY ANGIOGRAPHY;  Surgeon: Algernon Huxley, MD;  Location: [place] [facility];  Service: Cardiovascular;  Laterality: Left;   LOWER EXTREMITY INTERVENTION Right 09/07/2020   Procedure: LOWER EXTREMITY INTERVENTION;  Surgeon: Serafina Mitchell, MD;  Location: [place] [facility];  Service: Cardiovascular;  Laterality: Right;    There were no vitals filed for this visit.   Subjective Assessment - 03/17/21 1521     Subjective Patient reports 20% SANE score presently. Patient reports limited phantom limb pain. Paitent reports no concerning changes along residual limb. Patient has not used her prosthesis since last time in PT. Patient reports she wants to return to walking independently.    Pertinent History Pt is a 51 year old female s/p BKA 09/12/2020. Amputation was necessitated following gangrene of R lower limb with history of peripheral arterial disease and osteomyelitis of R fibula. Patient underwent the following vascular procedures in July 2022: Mechanical thrombectomy to the left iliac artery and distal aorta with the penumbra CAT 7D catheter as well as mechanical thrombectomy of the right common femoral artery, angioplasty of the aorta to expand the lifestream stent to 14 mm, stent placement to bilateral common iliac arteries up into the distal aorta with 8 mm diameter by 58 mm length lifestream stents, StarClose closure device bilateral femoral  arteries. Pt received her R below knee prosthesis on 01/30/21 and was taught how to don/doff her prosthesis and prosthesis components. Patient reports no post-operative complications. She reports doing well with skin care for her residual limb. Patient had a chance to try on her prosthesis. She has worked on standing at [employer] sink with her prosthesis on. Patient reports she does want to be able to drive again, complete yard work. Patient reports some phantom limb pain  early post-operatively. This happens seldom at this time. Patient reports she has gravel driveway to get to her home. Patient has 3 stairs to get into her home. Patient reports her home is on one level. Patient has to scoot laterally/sidestep in her bathroom given that AD cannot fit in her bathroom. Patient reports no other hazards in her home presently.    Limitations Walking;Standing;House hold activities    Diagnostic tests Angiography, abdominal aorta    Patient Stated Goals Able to drive, able to get around with prosthesis independently                OBJECTIVE FINDINGS  Gait With below knee prosthesis: bilateral upper extremity support on parallel bars, decreased R terminal knee extension, decreased single-limb support time bilaterally    Palpation Tenderness to palpation along medial/lateral skin flap of residual limb     FUNCTIONAL TESTS   Transfer chair to bed: Indpendent transfer stand-pivot on single-leg    Standing with prosthesis: Patient demonstrates stable standing with bilateral and unilateral upper limb support, slight R knee flexion in static standing with difficulty accessing terminal knee extension with RLE in standing      Strength R/L 4+/4 Hip flexion 5/4+ Hip extension  4/4 Hip abduction 5/5 Hip adduction 5/5 Knee extension 5/5 Knee flexion NT/4+ Ankle Dorsiflexion NT/5 Ankle Plantarflexion NT/4 Ankle Inversion NT/5 Ankle Eversion *indicates pain   AROM   Knee R/L Flexion: 110/130  Extension: -4/0 *indicates pain   Hip R/L Flexion: R WNL, L WNL Extension: RWNL L WNL Abduction: R WNL, L WNL Internal Rotation: R 15, L 30 External Rotation: R WNL, L WNL *indicates pain     Muscle Length Hamstring length: R lacking 10 degrees, L lacking 20 degrees Quad length ([place]): R WNL, L WNL Hip Flexor length Marcello Moores): R WNL, L WNL        TREATMENT    Manual Therapy - for symptom modulation, soft tissue sensitivity and mobility, joint  mobility, ROM    PROM and manual stretching with R residual limb, R knee PROM and R knee extension overpressure (gentle oscillations gr III at end-range), R hip PROM within pt tolerance     Therapeutic Activities - training for ambulation with prosthesis, stepping and standing/weight shifting drills to facilitate improved ability to perform standing activity on RLE prosthesis, repetitive task practice for improved performance of daily functional activities e.g. transferring    Re-assessment performed (see above and updated Goal section below)  Chair to low mat transfer without and with RLE prosthesis; supervision level of assist only     Standing with FWW with raised table behind patient Weight shifting, staggered stance; x20 in each position Standing march; 2x10 alternating     Gait with FWW; 1 lap around clinic following demonstration of use of AD; initiated with step-to pattern with gradual progression to step-through reciprocal gait with decreased gait velocity       *not today* Forward stepping with reciprocal pattern with bilateral UE support on bars; D/B length of bars x 4 Weight  shifting, feet side-by-side; x20 alternating R/L       Therapeutic Exercise - for improved soft tissue flexibility and extensibility as needed for ROM, improved strength as needed to improve performance of CKC activities/functional movements    Supine piriformis stretch; with R limb; 2x30sec     *next visit* With R below-knee prosthesis: Sit to stand with prosthesis    *not today* On low mat, without prosthesis: SLR; x10 bilaterally (for review) Sidelying Hip abduction; 2x10 bilaterally (for review) Single limb bridge; 2x10 with LLE        ASSESSMENT Patient exhibits improved R knee terminal extension and improved hamstring length on either side. She has modestly improved strength compared to IE, but she does have residual gluteal and ankle weakness per strength testing above. She  demonstrates safe transfer from chair to bed with and without her prosthesis and is independent with donning her below-knee prosthesis. She is able to perform one lap in clinic (approximately 80 feet of ambulation) with FWW without notable LOB; however, she demonstrates decreased terminal knee extension with reported of feeling of mechanical block around tibial plateau stopping her from fully extending. She reports pain around tibial plateau and distal limb after walking around gym for one lap and exhibits mild erythema along proximal anterior tibial crest and along midline of distal residual limb. Recommended f/u with prosthetist regarding fit of socket and trial of higher-ply prosthetic sock to ensure snug fit of residual limb. Pt would typically be expected to have further functional progress with prosthetic gait and functional mobility at this time, but she has had limited follow-up with PT since IE and pt not using her prosthesis at home. Patient has remaining deficits in mild residual limb terminal knee extension loss, mild knee flexion ROM deficit, decreased hip flexor and hip abductor strength bilaterally, decreased L ankle strength, impaired gait stability and gait deviations with R below knee prosthesis.. Patient will benefit from continued skilled therapeutic intervention to address the above deficits as needed for improved function and QoL.       PT Education - 03/18/21 1305     Education Details Discussed current progress with PT, use of piriformis stretching, follow-up with prosthetist regarding prosthesis fit to attain normal knee extension in standing, prognosis, expectations with daily exposure to weightbearing through prosthesis, plan of care. Discussed at length monitoring for signs of skin irritation/skin breakdown.    Person(s) Educated Patient;Child(ren)    Methods Explanation;Demonstration;Verbal cues    Comprehension Verbalized understanding;Returned demonstration               PT Short Term Goals - 03/17/21 1519       PT SHORT TERM GOAL #1   Title Pt will be independent with HEP in order to achieve sufficient residual limb ROM and increase strength in order to improve ability to ambulate and transfer for access to home and community    Baseline 02/10/21: Baseline HEP initiated.   03/17/21: Compliant with given MedBridge exercises; pt not performing standing with prosthesis since last follow-up in December    Time 3    Period Weeks    Status Partially Met    Target Date 03/03/21      PT SHORT TERM GOAL #2   Title Patient will achieve full terminal knee extension as needed for normalized gait pattern and symmetrical step length for community-level ambulation    Baseline 02/10/21: R knee extension AROM -6.   03/17/21: R knee extension -4, poor terminal knee extension with standing in prosthesis  Time 3    Period Weeks    Status Partially Met    Target Date 03/03/21      PT SHORT TERM GOAL #3   Title Patient will ambulate for 150 feet with front-wheel walker modified independent with no LOB and normal below-knee prosthetic gait pattern as needed for home level mobility with assistive device to improve pt independence    Baseline 02/10/21: Pt currently using W/C for primary means of mobility.   03/17/21: Ambulated 80 feet with FWW with supervision level of assist    Time 6    Period Weeks    Status Not Met    Target Date 03/24/21               PT Long Term Goals - 03/18/21 1315       PT LONG TERM GOAL #1   Title Patient will demonstrate improved function as evidenced by improvement in FOTO measure to predicted goal score for full participation in activities at home and in the community.    Baseline 02/10/21: No FOTO today, to be obtained next visit.  02/19/21: 44.    03/17/21: 43    Time 12    Period Weeks    Status Not Met    Target Date 05/05/21      PT LONG TERM GOAL #2   Title Pt will ambulate x 300 feet modified-independent with least restrictive  assistive device with no LOB good heel to toe progression and normalized prosthetic gait as needed for community-level mobility with BKA    Baseline 02/10/21: limited ambulation at this time with CGA and bilateral upper limb support.   03/17/21: ambulated 80 feet with FWW    Time 12    Period Weeks    Status Deferred    Target Date 05/05/21      PT LONG TERM GOAL #3   Title Patient will negotiate 3 steps with below-knee prosthesis, ascending and descending with unilateral upper extremity support modified-independently without loss of balance    Baseline 02/10/21: Pt negotiates steps with single-leg at this time with handrail assist; no prosthesis use for stairs yet.   03/17/21: Deferred    Time 12    Period Weeks    Status Deferred    Target Date 05/05/21                   Plan - 03/18/21 1313     Clinical Impression Statement Patient exhibits improved R knee terminal extension and improved hamstring length on either side. She has modestly improved strength compared to IE, but she does have residual gluteal and ankle weakness per strength testing above. She demonstrates safe transfer from chair to bed with and without her prosthesis and is independent with donning her below-knee prosthesis. She is able to perform one lap in clinic (approximately 80 feet of ambulation) with FWW without notable LOB; however, she demonstrates decreased terminal knee extension with reported of feeling of mechanical block around tibial plateau stopping her from fully extending. She reports pain around tibial plateau and distal limb after walking around gym for one lap and exhibits mild erythema along proximal anterior tibial crest and along midline of distal residual limb. Recommended f/u with prosthetist regarding fit of socket and trial of higher-ply prosthetic sock to ensure snug fit of residual limb. Pt would typically be expected to have further functional progress with prosthetic gait and functional mobility  at this time, but she has had limited follow-up with PT since IE  and pt not using her prosthesis at home. Patient has remaining deficits in mild residual limb terminal knee extension loss, mild knee flexion ROM deficit, decreased hip flexor and hip abductor strength bilaterally, decreased L ankle strength, impaired gait stability and gait deviations with R below knee prosthesis.. Patient will benefit from continued skilled therapeutic intervention to address the above deficits as needed for improved function and QoL.    Personal Factors and Comorbidities Comorbidity 3+;Transportation    Comorbidities RA, PAD, Hx of R fibula osteomyelitis and R BKA, Wegener's disease    Examination-Activity Limitations Bathing;Stairs;Stand;Transfers;Locomotion Level;Squat    Examination-Participation Restrictions Community Activity    Stability/Clinical Decision Making Evolving/Moderate complexity    Rehab Potential Good    PT Frequency 2x / week    PT Duration 12 weeks    PT Treatment/Interventions ADLs/Self Care Home Management;Cryotherapy;Electrical Stimulation;Moist Heat;Gait training;Stair training;Functional mobility training;Therapeutic activities;Therapeutic exercise;Balance training;Neuromuscular re-education;Patient/family education;Prosthetic Training;Manual techniques;Passive range of motion    PT Next Visit Plan Continue with [place] hip strengthening, LLE strengthening, R knee extension ROM, gait training with R below knee prosthesis    PT Home Exercise Plan Access Code [email or web address]    Consulted and Agree with Plan of Care Patient             Patient will benefit from skilled therapeutic intervention in order to improve the following deficits and impairments:  Abnormal gait, Decreased balance, Difficulty walking, Impaired flexibility, Pain, Decreased strength, Decreased range of motion  Visit Diagnosis: Difficulty in walking, not elsewhere classified  Muscle weakness  (generalized)  Imbalance  History of below knee amputation, right [facility])     Problem List Patient Active Problem List   Diagnosis Date Noted   Ischemic leg 10/01/2020   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[place]) 09/26/2020   Vasculitic leg ulcer ([facility])    Right leg swelling    Aortic thrombus ([place]) 09/07/2020   Ischemic pain of right foot: Concern for/probable 09/07/2020   Foot pain, right 09/06/2020   Vasculitis ([place]) 09/04/2020   Iron deficiency anemia    Thrombocytosis    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 [place], [place], [address] Phone: [phone]   Fax:  [phone]  Name: Alexandra [name] MRN: [id number] Date of Birth: May 30, 1970

## 2021-03-18 ENCOUNTER — Encounter: Payer: Self-pay | Admitting: Physical Therapy

## 2021-03-19 ENCOUNTER — Ambulatory Visit: Payer: Medicaid Other | Admitting: Physical Therapy

## 2021-03-19 NOTE — Patient Instructions (Incomplete)
°  TREATMENT     Manual Therapy - for symptom modulation, soft tissue sensitivity and mobility, joint mobility, ROM    PROM and manual stretching with R residual limb, R knee PROM and R knee extension overpressure (gentle oscillations gr III at end-range), R hip PROM within pt tolerance     Therapeutic Activities - training for ambulation with prosthesis, stepping and standing/weight shifting drills to facilitate improved ability to perform standing activity on RLE prosthesis, repetitive task practice for improved performance of daily functional activities e.g. transferring     Re-assessment performed (see above and updated Goal section below)   Chair to low mat transfer without and with RLE prosthesis; supervision level of assist only     Standing with FWW with raised table behind patient Weight shifting, staggered stance; x20 in each position Standing march; 2x10 alternating      Gait with FWW; 1 lap around clinic following demonstration of use of AD; initiated with step-to pattern with gradual progression to step-through reciprocal gait with decreased gait velocity       *not today* Forward stepping with reciprocal pattern with bilateral UE support on bars; D/B length of bars x 4 Weight shifting, feet side-by-side; x20 alternating R/L       Therapeutic Exercise - for improved soft tissue flexibility and extensibility as needed for ROM, improved strength as needed to improve performance of CKC activities/functional movements     Supine piriformis stretch; with R limb; 2x30sec     *next visit* With R below-knee prosthesis: Sit to stand with prosthesis      *not today* On low mat, without prosthesis: SLR; x10 bilaterally (for review) Sidelying Hip abduction; 2x10 bilaterally (for review) Single limb bridge; 2x10 with LLE        ASSESSMENT Patient exhibits improved R knee terminal extension and improved hamstring length on either side. She has modestly improved  strength compared to IE, but she does have residual gluteal and ankle weakness per strength testing above. She demonstrates safe transfer from chair to bed with and without her prosthesis and is independent with donning her below-knee prosthesis. She is able to perform one lap in clinic (approximately 80 feet of ambulation) with FWW without notable LOB; however, she demonstrates decreased terminal knee extension with reported of feeling of mechanical block around tibial plateau stopping her from fully extending. She reports pain around tibial plateau and distal limb after walking around gym for one lap and exhibits mild erythema along proximal anterior tibial crest and along midline of distal residual limb. Recommended f/u with prosthetist regarding fit of socket and trial of higher-ply prosthetic sock to ensure snug fit of residual limb. Pt would typically be expected to have further functional progress with prosthetic gait and functional mobility at this time, but she has had limited follow-up with PT since IE and pt not using her prosthesis at home. Patient has remaining deficits in mild residual limb terminal knee extension loss, mild knee flexion ROM deficit, decreased hip flexor and hip abductor strength bilaterally, decreased L ankle strength, impaired gait stability and gait deviations with R below knee prosthesis.. Patient will benefit from continued skilled therapeutic intervention to address the above deficits as needed for improved function and QoL.

## 2021-03-24 ENCOUNTER — Ambulatory Visit: Payer: Medicaid Other | Admitting: Physical Therapy

## 2021-03-24 ENCOUNTER — Encounter: Payer: Self-pay | Admitting: Physical Therapy

## 2021-03-24 ENCOUNTER — Other Ambulatory Visit: Payer: Self-pay

## 2021-03-24 DIAGNOSIS — R262 Difficulty in walking, not elsewhere classified: Secondary | ICD-10-CM

## 2021-03-24 DIAGNOSIS — Z89511 Acquired absence of right leg below knee: Secondary | ICD-10-CM

## 2021-03-24 DIAGNOSIS — M6281 Muscle weakness (generalized): Secondary | ICD-10-CM

## 2021-03-24 DIAGNOSIS — R2689 Other abnormalities of gait and mobility: Secondary | ICD-10-CM

## 2021-03-24 NOTE — Therapy (Signed)
[facility] [address] [place], [place], [address] Phone: [phone]   Fax:  [phone]  Physical Therapy Treatment  Patient Details  Name: Alexandra [name] MRN: [id number] Date of Birth: September 16, 1970 Referring Provider (PT): Dominica Severin, MD   Encounter Date: 03/24/2021   PT End of Session - 03/25/21 1111     Visit Number 4    Number of Visits 25    Date for PT Re-Evaluation 05/05/21    Authorization Type Medicaid, visit limit 27 max combined PT/OT/Speech per year    Authorization Time Period Cert 04/88/89-1/69/45; Auth not required first 12 visits    Progress Note Due on Visit 12    PT Start Time 1518    PT Stop Time 1602    PT Time Calculation (min) 44 min    Equipment Utilized During Treatment Gait belt   Wheelchair for community-level mobility, R below knee prosthesis (K3)   Activity Tolerance Patient tolerated treatment well    Behavior During Therapy WFL for tasks assessed/performed             Past Medical History:  Diagnosis Date   Breast discharge 2013   present X 3 years  Left only multiple ducts per pt   Depression    Rheumatoid arthritis ([place])    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 ([place])     Past Surgical History:  Procedure Laterality Date   ABDOMINAL HYSTERECTOMY     AMPUTATION Right 09/12/2020   Procedure: AMPUTATION BELOW KNEE;  Surgeon: Algernon Huxley, MD;  Location: [facility];  Service: Vascular;  Laterality: Right;   BREAST CYST EXCISION Left 2006   ENDOVASCULAR STENT INSERTION N/A 09/08/2020   Procedure: ENDOVASCULAR STENT GRAFT INSERTION;  Surgeon: Algernon Huxley, MD;  Location: [facility];  Service: Vascular;  Laterality: N/A;   LOWER EXTREMITY ANGIOGRAPHY Right 09/01/2020   Procedure: Lower Extremity Angiography;  Surgeon: Algernon Huxley, MD;  Location: [place] [facility];  Service: Cardiovascular;  Laterality: Right;   LOWER EXTREMITY ANGIOGRAPHY Left 10/01/2020   Procedure: LOWER  EXTREMITY ANGIOGRAPHY;  Surgeon: Algernon Huxley, MD;  Location: [place] [facility];  Service: Cardiovascular;  Laterality: Left;   LOWER EXTREMITY INTERVENTION Right 09/07/2020   Procedure: LOWER EXTREMITY INTERVENTION;  Surgeon: Serafina Mitchell, MD;  Location: [place] [facility];  Service: Cardiovascular;  Laterality: Right;    There were no vitals filed for this visit.   Subjective Assessment - 03/24/21 1519     Subjective Patient reports intermittent pain affecting her along L side of low back and wrapping around to her groin. She reports that she has worked on piriformis stretch given last visit. Patient reports experiencing some phantom limb pain recently. Patient reports intermittently "not feeling good" and missing second appointment in PT last week due to this. She reports feeling fatigued, lightheaded. Pt will be following up with physician regarding Wegener's disesae medical management.    Pertinent History Pt is a 51 year old female s/p BKA 09/12/2020. Amputation was necessitated following gangrene of R lower limb with history of peripheral arterial disease and osteomyelitis of R fibula. Patient underwent the following vascular procedures in July 2022: Mechanical thrombectomy to the left iliac artery and distal aorta with the penumbra CAT 7D catheter as well as mechanical thrombectomy of the right common femoral artery, angioplasty of the aorta to expand the lifestream stent to 14 mm, stent placement to bilateral common iliac arteries up into the distal aorta with 8  mm diameter by 58 mm length lifestream stents, StarClose closure device bilateral femoral arteries. Pt received her R below knee prosthesis on 01/30/21 and was taught how to don/doff her prosthesis and prosthesis components. Patient reports no post-operative complications. She reports doing well with skin care for her residual limb. Patient had a chance to try on her prosthesis. She has worked on standing at [employer] sink with her  prosthesis on. Patient reports she does want to be able to drive again, complete yard work. Patient reports some phantom limb pain early post-operatively. This happens seldom at this time. Patient reports she has gravel driveway to get to her home. Patient has 3 stairs to get into her home. Patient reports her home is on one level. Patient has to scoot laterally/sidestep in her bathroom given that AD cannot fit in her bathroom. Patient reports no other hazards in her home presently.    Limitations Walking;Standing;House hold activities    Diagnostic tests Angiography, abdominal aorta    Patient Stated Goals Able to drive, able to get around with prosthesis independently                TREATMENT     Manual Therapy - for symptom modulation, soft tissue sensitivity and mobility, joint mobility, ROM    PROM and manual stretching with R residual limb, R knee PROM and R knee extension overpressure (gentle oscillations gr III at end-range), R hip PROM within pt tolerance     Therapeutic Activities - training for ambulation with prosthesis, stepping and standing/weight shifting drills to facilitate improved ability to perform standing activity on RLE prosthesis, repetitive task practice for improved performance of daily functional activities e.g. transferring     Chair to low mat transfer without and with RLE prosthesis; supervision level of assist only [pt transferring over residual limb rest and L footrest with initial transfer]   Gait with FWW; 2 lap around clinic with step-through pattern, verbal cueing for heel to toe progression    // bars, with RLE prosthesis donned:  Standing march; 1x10 alternating (*pain along R tibial plateau, residual limb) Sidestepping, 2x D/B    Reviewed use of mirror therapy to address phantom limb pain and improve sensitization of R residual limb. Demonstrated and performed ankle pumps, heel raises, and seated knee extension with mirror in between patient's  lower limbs in sitting    Patient education: discussed safe transfer from wheelchair with moving rest for residual limb and footrest to the side and getting wheelchair as close to adjacent surface as possible for transfer. Discussed importance of daily wear of her prosthesis to further help with shaping of residual limb and to gradually expose lower limb to load-bearing for longer duration.      *not today* Forward stepping with reciprocal pattern with bilateral UE support on bars; D/B length of bars x 4 Weight shifting, feet side-by-side; x20 alternating R/L       Therapeutic Exercise - for improved soft tissue flexibility and extensibility as needed for ROM, improved strength as needed to improve performance of CKC activities/functional movements     Supine piriformis stretch; with R limb; 3x30sec (reviewed for HEP)   With R below-knee prosthesis: Sit to stand with prosthesis; 2x10, edge of low mat with FWW in front of patient    On low mat, without prosthesis: Single limb bridge; 2x10 with LLE    *not today* SLR; x10 bilaterally (for review) Sidelying Hip abduction; 2x10 bilaterally (for review)  ASSESSMENT Patient does exhibit mild decreased terminal knee extension while donning her prosthesis. She is able to fully extend her R knee passively with clinician overpressure and is able to extend R knee in open-chain on edge of low mat. Patient feels sensation of mechanical block along anterior tibial crest when attempting to fully extend her knee in standing. She exhibits improving stability with forward stepping and is able to progress to frontal plane movement/side stepping without notable LOB with bilateral upper limb support. Pt will benefit from follow-up with prosthetist to address comfort of socket and appropriate fit to allow for closed-chain knee extension. Pt did not bring extra socks today, and may benefit from higher-ply socks to improve fit of prosthesis. Patient  has remaining deficits in mild residual limb terminal knee extension loss, mild knee flexion ROM deficit, decreased hip flexor and hip abductor strength bilaterally, decreased L ankle strength, impaired gait stability and gait deviations with R below knee prosthesis.. Patient will benefit from continued skilled therapeutic intervention to address the above deficits as needed for improved function and QoL.       PT Short Term Goals - 03/17/21 1519       PT SHORT TERM GOAL #1   Title Pt will be independent with HEP in order to achieve sufficient residual limb ROM and increase strength in order to improve ability to ambulate and transfer for access to home and community    Baseline 02/10/21: Baseline HEP initiated.   03/17/21: Compliant with given MedBridge exercises; pt not performing standing with prosthesis since last follow-up in December    Time 3    Period Weeks    Status Partially Met    Target Date 03/03/21      PT SHORT TERM GOAL #2   Title Patient will achieve full terminal knee extension as needed for normalized gait pattern and symmetrical step length for community-level ambulation    Baseline 02/10/21: R knee extension AROM -6.   03/17/21: R knee extension -4, poor terminal knee extension with standing in prosthesis    Time 3    Period Weeks    Status Partially Met    Target Date 03/03/21      PT SHORT TERM GOAL #3   Title Patient will ambulate for 150 feet with front-wheel walker modified independent with no LOB and normal below-knee prosthetic gait pattern as needed for home level mobility with assistive device to improve pt independence    Baseline 02/10/21: Pt currently using W/C for primary means of mobility.   03/17/21: Ambulated 80 feet with FWW with supervision level of assist    Time 6    Period Weeks    Status Not Met    Target Date 03/24/21               PT Long Term Goals - 03/18/21 1315       PT LONG TERM GOAL #1   Title Patient will demonstrate improved  function as evidenced by improvement in FOTO measure to predicted goal score for full participation in activities at home and in the community.    Baseline 02/10/21: No FOTO today, to be obtained next visit.  02/19/21: 44.    03/17/21: 43    Time 12    Period Weeks    Status Not Met    Target Date 05/05/21      PT LONG TERM GOAL #2   Title Pt will ambulate x 300 feet modified-independent with least restrictive assistive device with no  LOB good heel to toe progression and normalized prosthetic gait as needed for community-level mobility with BKA    Baseline 02/10/21: limited ambulation at this time with CGA and bilateral upper limb support.   03/17/21: ambulated 80 feet with FWW    Time 12    Period Weeks    Status Deferred    Target Date 05/05/21      PT LONG TERM GOAL #3   Title Patient will negotiate 3 steps with below-knee prosthesis, ascending and descending with unilateral upper extremity support modified-independently without loss of balance    Baseline 02/10/21: Pt negotiates steps with single-leg at this time with handrail assist; no prosthesis use for stairs yet.   03/17/21: Deferred    Time 12    Period Weeks    Status Deferred    Target Date 05/05/21                   Plan - 03/25/21 1118     Clinical Impression Statement Patient does exhibit mild decreased terminal knee extension while donning her prosthesis. She is able to fully extend her R knee passively with clinician overpressure and is able to extend R knee in open-chain on edge of low mat. Patient feels sensation of mechanical block along anterior tibial crest when attempting to fully extend her knee in standing. She exhibits improving stability with forward stepping and is able to progress to frontal plane movement/side stepping without notable LOB with bilateral upper limb support. Pt will benefit from follow-up with prosthetist to address comfort of socket and appropriate fit to allow for closed-chain knee  extension. Pt did not bring extra socks today, and may benefit from higher-ply socks to improve fit of prosthesis. Patient has remaining deficits in mild residual limb terminal knee extension loss, mild knee flexion ROM deficit, decreased hip flexor and hip abductor strength bilaterally, decreased L ankle strength, impaired gait stability and gait deviations with R below knee prosthesis.. Patient will benefit from continued skilled therapeutic intervention to address the above deficits as needed for improved function and QoL.    Personal Factors and Comorbidities Comorbidity 3+;Transportation    Comorbidities RA, PAD, Hx of R fibula osteomyelitis and R BKA, Wegener's disease    Examination-Activity Limitations Bathing;Stairs;Stand;Transfers;Locomotion Level;Squat    Examination-Participation Restrictions Community Activity    Stability/Clinical Decision Making Evolving/Moderate complexity    Rehab Potential Good    PT Frequency 2x / week    PT Duration 12 weeks    PT Treatment/Interventions ADLs/Self Care Home Management;Cryotherapy;Electrical Stimulation;Moist Heat;Gait training;Stair training;Functional mobility training;Therapeutic activities;Therapeutic exercise;Balance training;Neuromuscular re-education;Patient/family education;Prosthetic Training;Manual techniques;Passive range of motion    PT Next Visit Plan Continue with [place] hip strengthening, LLE strengthening, R knee extension ROM, gait training with R below knee prosthesis    PT Home Exercise Plan Access Code [email or web address]    Consulted and Agree with Plan of Care Patient             Patient will benefit from skilled therapeutic intervention in order to improve the following deficits and impairments:  Abnormal gait, Decreased balance, Difficulty walking, Impaired flexibility, Pain, Decreased strength, Decreased range of motion  Visit Diagnosis: Difficulty in walking, not elsewhere classified  Muscle weakness  (generalized)  Imbalance  History of below knee amputation, right [facility])     Problem List Patient Active Problem List   Diagnosis Date Noted   Ischemic leg 10/01/2020   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[place]) 09/26/2020   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[place])    Right leg swelling    Aortic thrombus ([place]) 09/07/2020   Ischemic pain of right foot: Concern for/probable 09/07/2020   Foot pain, right 09/06/2020   Vasculitis ([place]) 09/04/2020   Iron deficiency anemia    Thrombocytosis    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 [place], [place], [address] Phone: [phone]   Fax:  [phone]  Name: Alexandra [name] MRN: [id number] Date of Birth: 10-13-1970

## 2021-03-26 ENCOUNTER — Ambulatory Visit: Payer: Medicaid Other | Admitting: Physical Therapy

## 2021-03-31 ENCOUNTER — Ambulatory Visit: Payer: Medicaid Other | Admitting: Physical Therapy

## 2021-03-31 ENCOUNTER — Other Ambulatory Visit: Payer: Self-pay

## 2021-03-31 ENCOUNTER — Encounter: Payer: Self-pay | Admitting: Physical Therapy

## 2021-03-31 DIAGNOSIS — R262 Difficulty in walking, not elsewhere classified: Secondary | ICD-10-CM | POA: Diagnosis not present

## 2021-03-31 DIAGNOSIS — M6281 Muscle weakness (generalized): Secondary | ICD-10-CM

## 2021-03-31 DIAGNOSIS — Z89511 Acquired absence of right leg below knee: Secondary | ICD-10-CM

## 2021-03-31 DIAGNOSIS — R2689 Other abnormalities of gait and mobility: Secondary | ICD-10-CM

## 2021-03-31 NOTE — Therapy (Signed)
[place] [facility] [address] [place], [place], [address] Phone: [phone]   Fax:  [phone]  Physical Therapy Treatment  Patient Details  Name: Alexandra [name] MRN: [id number] Date of Birth: [birth year] Referring Provider (PT): Dominica Severin, MD   Encounter Date: 03/31/2021   PT End of Session - 03/31/21 1522     Visit Number 5    Number of Visits 25    Date for PT Re-Evaluation 05/05/21    Authorization Type Medicaid, visit limit 27 max combined PT/OT/Speech per year    Authorization Time Period Cert 63/84/66-5/99/35; Auth not required first 12 visits    Progress Note Due on Visit 12    PT Start Time 1519    PT Stop Time 1605    PT Time Calculation (min) 46 min    Equipment Utilized During Treatment Gait belt   Wheelchair for community-level mobility, R below knee prosthesis (K3)   Activity Tolerance Patient tolerated treatment well    Behavior During Therapy WFL for tasks assessed/performed             Past Medical History:  Diagnosis Date   Breast discharge 2013   present X 3 years  Left only multiple ducts per pt   Depression    Rheumatoid arthritis ([place])    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 ([place])     Past Surgical History:  Procedure Laterality Date   ABDOMINAL HYSTERECTOMY     AMPUTATION Right 09/12/2020   Procedure: AMPUTATION BELOW KNEE;  Surgeon: Algernon Huxley, MD;  Location: [facility] ORS;  Service: Vascular;  Laterality: Right;   BREAST CYST EXCISION Left 2006   ENDOVASCULAR STENT INSERTION N/A 09/08/2020   Procedure: ENDOVASCULAR STENT GRAFT INSERTION;  Surgeon: Algernon Huxley, MD;  Location: [facility] ORS;  Service: Vascular;  Laterality: N/A;   LOWER EXTREMITY ANGIOGRAPHY Right 09/01/2020   Procedure: Lower Extremity Angiography;  Surgeon: Algernon Huxley, MD;  Location: [place] [facility];  Service: Cardiovascular;  Laterality: Right;   LOWER EXTREMITY ANGIOGRAPHY Left 10/01/2020   Procedure: LOWER  EXTREMITY ANGIOGRAPHY;  Surgeon: Algernon Huxley, MD;  Location: [place] [facility];  Service: Cardiovascular;  Laterality: Left;   LOWER EXTREMITY INTERVENTION Right 09/07/2020   Procedure: LOWER EXTREMITY INTERVENTION;  Surgeon: Serafina Mitchell, MD;  Location: [place] [facility];  Service: Cardiovascular;  Laterality: Right;    There were no vitals filed for this visit.   Subjective Assessment - 04/01/21 1341     Subjective Patient reports discomfort along L groin that does hurt with piriformis stretch discussed with patient previously. She reports pain in groin that wraps around to gluteal region intermittently. She reports most pain along site of  femoral catheterization. She reports tolerating standing in her prosthesis better recently. She is following up with prosthetist this Thursday. She had to cancel last week due to not feeling well - headache and notable fatigue. Pt has no migraine Hx.    Pertinent History Pt is a 51 year old female s/p BKA 09/12/2020. Amputation was necessitated following gangrene of R lower limb with history of peripheral arterial disease and osteomyelitis of R fibula. Patient underwent the following vascular procedures in July 2022: Mechanical thrombectomy to the left iliac artery and distal aorta with the penumbra CAT 7D catheter as well as mechanical thrombectomy of the right common femoral artery, angioplasty of the aorta to expand the lifestream stent to 14 mm, stent placement to bilateral common iliac arteries up into the  distal aorta with 8 mm diameter by 58 mm length lifestream stents, StarClose closure device bilateral femoral arteries. Pt received her R below knee prosthesis on 01/30/21 and was taught how to don/doff her prosthesis and prosthesis components. Patient reports no post-operative complications. She reports doing well with skin care for her residual limb. Patient had a chance to try on her prosthesis. She has worked on standing at [employer] sink with her  prosthesis on. Patient reports she does want to be able to drive again, complete yard work. Patient reports some phantom limb pain early post-operatively. This happens seldom at this time. Patient reports she has gravel driveway to get to her home. Patient has 3 stairs to get into her home. Patient reports her home is on one level. Patient has to scoot laterally/sidestep in her bathroom given that AD cannot fit in her bathroom. Patient reports no other hazards in her home presently.    Limitations Walking;Standing;House hold activities    Diagnostic tests Angiography, abdominal aorta    Patient Stated Goals Able to drive, able to get around with prosthesis independently               OBJECTIVE FINDINGS  Special Testing FADDIR: R Negative, L Not tested  Hip PROM is [facility] without reproduction of pain with end-range PROM and overpressure      TREATMENT     Manual Therapy - for symptom modulation, soft tissue sensitivity and mobility, joint mobility, ROM    PROM and manual stretching with R residual limb, R knee PROM and R knee extension overpressure (gentle oscillations gr III at end-range), R hip PROM within pt tolerance     Therapeutic Activities - training for ambulation with prosthesis, stepping and standing/weight shifting drills to facilitate improved ability to perform standing activity on RLE prosthesis, repetitive task practice for improved performance of daily functional activities e.g. transferring; patient education    Mirror therapy; rolling mirror between lower limbs in sitting; seated knee extension x20 and seated HR/TR x 20 with visual feedback to facilitate neuroplastic changes   Staggered stance weight shifting; x10 ea LE  Gait with FWW; 2 lap around clinic with step-through pattern, verbal cueing for heel to toe progression   // bars, with RLE prosthesis donned:  Sidestepping, 1x D/B  Forward stepping, step-to progressing to step-through with unilateral UE  support on left; 3x D/B   Patient education: recommended holding on piriformis stretch of experiencing aggravation of symptoms with this exercise; reviewed basic concept for scar massage for R inguinal region    *not today* Standing march; 1x10 alternating (*pain along R tibial plateau, residual limb) Forward stepping with reciprocal pattern with bilateral UE support on bars; D/B length of bars x 4 Weight shifting, feet side-by-side; x20 alternating R/L       Therapeutic Exercise - for improved soft tissue flexibility and extensibility as needed for ROM, improved strength as needed to improve performance of CKC activities/functional movements   With R below-knee prosthesis: Sit to stand with prosthesis; 2x10, edge of low mat with FWW in front of patient    On low mat, without prosthesis: Single limb bridge; 2x10 with LLE       *not today* Supine piriformis stretch; with R limb; 3x30sec; discharged SLR; x10 bilaterally (for review) Sidelying Hip abduction; 2x10 bilaterally (for review)         ASSESSMENT Patient feels comfortable with single-ply sock in standing today with prosthesis. Patient does still have moderate discomfort along residual limb with contact  pressure in her socket. She demonstrates improving stride length and heel strike at initial contact with prosthetic LE. She demonstrates improving extension in standing likely associated with improving tolerance of weightbearing with prosthesis with further exposure to prosthesis use. Patient has remaining deficits in mild residual limb terminal knee extension loss, mild knee flexion ROM deficit, decreased hip flexor and hip abductor strength bilaterally, decreased L ankle strength, impaired gait stability and gait deviations with R below knee prosthesis.. Patient will benefit from continued skilled therapeutic intervention to address the above deficits as needed for improved function and QoL.      PT Short Term Goals -  03/17/21 1519       PT SHORT TERM GOAL #1   Title Pt will be independent with HEP in order to achieve sufficient residual limb ROM and increase strength in order to improve ability to ambulate and transfer for access to home and community    Baseline 02/10/21: Baseline HEP initiated.   03/17/21: Compliant with given MedBridge exercises; pt not performing standing with prosthesis since last follow-up in December    Time 3    Period Weeks    Status Partially Met    Target Date 03/03/21      PT SHORT TERM GOAL #2   Title Patient will achieve full terminal knee extension as needed for normalized gait pattern and symmetrical step length for community-level ambulation    Baseline 02/10/21: R knee extension AROM -6.   03/17/21: R knee extension -4, poor terminal knee extension with standing in prosthesis    Time 3    Period Weeks    Status Partially Met    Target Date 03/03/21      PT SHORT TERM GOAL #3   Title Patient will ambulate for 150 feet with front-wheel walker modified independent with no LOB and normal below-knee prosthetic gait pattern as needed for home level mobility with assistive device to improve pt independence    Baseline 02/10/21: Pt currently using W/C for primary means of mobility.   03/17/21: Ambulated 80 feet with FWW with supervision level of assist    Time 6    Period Weeks    Status Not Met    Target Date 03/24/21               PT Long Term Goals - 03/18/21 1315       PT LONG TERM GOAL #1   Title Patient will demonstrate improved function as evidenced by improvement in FOTO measure to predicted goal score for full participation in activities at home and in the community.    Baseline 02/10/21: No FOTO today, to be obtained next visit.  02/19/21: 44.    03/17/21: 43    Time 12    Period Weeks    Status Not Met    Target Date 05/05/21      PT LONG TERM GOAL #2   Title Pt will ambulate x 300 feet modified-independent with least restrictive assistive device with  no LOB good heel to toe progression and normalized prosthetic gait as needed for community-level mobility with BKA    Baseline 02/10/21: limited ambulation at this time with CGA and bilateral upper limb support.   03/17/21: ambulated 80 feet with FWW    Time 12    Period Weeks    Status Deferred    Target Date 05/05/21      PT LONG TERM GOAL #3   Title Patient will negotiate 3 steps with below-knee prosthesis, ascending  and descending with unilateral upper extremity support modified-independently without loss of balance    Baseline 02/10/21: Pt negotiates steps with single-leg at this time with handrail assist; no prosthesis use for stairs yet.   03/17/21: Deferred    Time 12    Period Weeks    Status Deferred    Target Date 05/05/21                   Plan - 04/01/21 1354     Clinical Impression Statement Patient feels comfortable with single-ply sock in standing today with prosthesis. Patient does still have moderate discomfort along residual limb with contact pressure in her socket. She demonstrates improving stride length and heel strike at initial contact with prosthetic LE. She demonstrates improving extension in standing likely associated with improving tolerance of weightbearing with prosthesis with further exposure to prosthesis use. Patient has remaining deficits in mild residual limb terminal knee extension loss, mild knee flexion ROM deficit, decreased hip flexor and hip abductor strength bilaterally, decreased L ankle strength, impaired gait stability and gait deviations with R below knee prosthesis.. Patient will benefit from continued skilled therapeutic intervention to address the above deficits as needed for improved function and QoL.    Personal Factors and Comorbidities Comorbidity 3+;Transportation    Comorbidities RA, PAD, Hx of R fibula osteomyelitis and R BKA, Wegener's disease    Examination-Activity Limitations Bathing;Stairs;Stand;Transfers;Locomotion Level;Squat     Examination-Participation Restrictions Community Activity    Stability/Clinical Decision Making Evolving/Moderate complexity    Rehab Potential Good    PT Frequency 2x / week    PT Duration 12 weeks    PT Treatment/Interventions ADLs/Self Care Home Management;Cryotherapy;Electrical Stimulation;Moist Heat;Gait training;Stair training;Functional mobility training;Therapeutic activities;Therapeutic exercise;Balance training;Neuromuscular re-education;Patient/family education;Prosthetic Training;Manual techniques;Passive range of motion    PT Next Visit Plan Continue with [place] hip strengthening, LLE strengthening, R knee extension ROM, gait training with R below knee prosthesis    PT Home Exercise Plan Access Code [email or web address]    Consulted and Agree with Plan of Care Patient             Patient will benefit from skilled therapeutic intervention in order to improve the following deficits and impairments:  Abnormal gait, Decreased balance, Difficulty walking, Impaired flexibility, Pain, Decreased strength, Decreased range of motion  Visit Diagnosis: Difficulty in walking, not elsewhere classified  Muscle weakness (generalized)  Imbalance  History of below knee amputation, right [facility])     Problem List Patient Active Problem List   Diagnosis Date Noted   Ischemic leg 10/01/2020   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[place]) 09/26/2020   Vasculitic leg ulcer (HCC)    Right leg swelling    Aortic thrombus ([place]) 09/07/2020   Ischemic pain of right foot: Concern for/probable 09/07/2020   Foot pain, right 09/06/2020   Vasculitis ([place]) 09/04/2020   Iron deficiency anemia    Thrombocytosis    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 [place], [place], [address] Phone: [phone]   Fax:  [phone]  Name: Alexandra [name] MRN: [id number] Date of Birth: Sep 18, 1970

## 2021-04-02 ENCOUNTER — Ambulatory Visit: Payer: Medicaid Other | Admitting: Physical Therapy

## 2021-04-07 ENCOUNTER — Ambulatory Visit: Payer: Medicaid Other | Admitting: Physical Therapy

## 2021-04-07 ENCOUNTER — Encounter: Payer: Self-pay | Admitting: Oncology

## 2021-04-08 ENCOUNTER — Other Ambulatory Visit (INDEPENDENT_AMBULATORY_CARE_PROVIDER_SITE_OTHER): Payer: Self-pay | Admitting: Nurse Practitioner

## 2021-04-08 DIAGNOSIS — I739 Peripheral vascular disease, unspecified: Secondary | ICD-10-CM

## 2021-04-09 ENCOUNTER — Ambulatory Visit: Payer: Medicaid Other | Admitting: Physical Therapy

## 2021-04-09 ENCOUNTER — Ambulatory Visit (INDEPENDENT_AMBULATORY_CARE_PROVIDER_SITE_OTHER): Payer: Medicaid Other | Admitting: Nurse Practitioner

## 2021-04-09 ENCOUNTER — Ambulatory Visit (INDEPENDENT_AMBULATORY_CARE_PROVIDER_SITE_OTHER): Payer: Medicaid Other

## 2021-04-09 ENCOUNTER — Other Ambulatory Visit: Payer: Self-pay

## 2021-04-09 VITALS — BP 106/72 | HR 80 | Ht 64.0 in | Wt 150.0 lb

## 2021-04-09 DIAGNOSIS — Z9889 Other specified postprocedural states: Secondary | ICD-10-CM | POA: Diagnosis not present

## 2021-04-09 DIAGNOSIS — I70222 Atherosclerosis of native arteries of extremities with rest pain, left leg: Secondary | ICD-10-CM

## 2021-04-09 DIAGNOSIS — I739 Peripheral vascular disease, unspecified: Secondary | ICD-10-CM

## 2021-04-12 ENCOUNTER — Encounter (INDEPENDENT_AMBULATORY_CARE_PROVIDER_SITE_OTHER): Payer: Self-pay | Admitting: Nurse Practitioner

## 2021-04-12 NOTE — Progress Notes (Signed)
Subjective:    Patient ID: Alexandra [name], female    DOB: [birth year], 51 y.o.   MRN: [id number] Chief Complaint  Patient presents with   Follow-up    U/S F/U    Alexandra [name] is a 51 year old female that presents today after there has been some swelling in her left lower extremity.  She has had this happen suddenly and just occurred at 1 small circumscribed area.  There was concern that there may have been possible this may be related to recently decreased circulation.  The patient has had a previous right below-knee amputation as well as ischemia of her left lower extremity.  She denies any claudication or rest pain.  Today noninvasive studies show an ABI of 1.25 on the left.  Previously was 1.12.  Today the patient has triphasic tibial artery waveforms as well as normal toe waveforms in the left lower extremity.   Review of Systems  Cardiovascular:  Negative for leg swelling.  Skin:  Negative for wound.  All other systems reviewed and are negative.     Objective:   Physical Exam Vitals reviewed.  HENT:     Head: Normocephalic.  Cardiovascular:     Rate and Rhythm: Normal rate.     Pulses:          Dorsalis pedis pulses are 1+ on the left side.       Posterior tibial pulses are 1+ on the left side.  Pulmonary:     Effort: Pulmonary effort is normal.  Musculoskeletal:     Right Lower Extremity: Right leg is amputated below knee.  Skin:    General: Skin is warm and dry.  Neurological:     Mental Status: She is alert and oriented to person, place, and time.  Psychiatric:        Mood and Affect: Mood normal.        Behavior: Behavior normal.        Thought Content: Thought content normal.        Judgment: Judgment normal.    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 ([place])     Social History   Socioeconomic History   Marital status: Divorced    Spouse name: Not on file   Number of children: Not on file   Years of education: Not on file   Highest education level: Not on file  Occupational History   Not on file  Tobacco Use   Smoking status: Former    Packs/day: 1.00    Years: 29.00    Pack years: 29.00    Types: Cigarettes   Smokeless tobacco: Never  Vaping Use   Vaping Use: Never used  Substance and Sexual Activity   Alcohol use: Not Currently    Comment: social   Drug use: No   Sexual activity: Yes  Other Topics Concern   Not on file  Social History Narrative   Not on file   Social Determinants of Health   Financial Resource Strain: Not on file  Food Insecurity: Not on file  Transportation Needs: Not on file  Physical Activity: Not on file  Stress: Not on file  Social Connections: Not on file  Intimate Partner Violence: Not on file    Past Surgical History:  Procedure Laterality Date   ABDOMINAL HYSTERECTOMY     AMPUTATION Right 09/12/2020   Procedure: AMPUTATION BELOW KNEE;  Surgeon: Algernon Huxley, MD;  Location: [facility] ORS;  Service: Vascular;  Laterality: Right;   BREAST CYST EXCISION Left 2006   ENDOVASCULAR STENT INSERTION N/A 09/08/2020   Procedure: ENDOVASCULAR STENT GRAFT INSERTION;  Surgeon: Algernon Huxley, MD;  Location: [facility];  Service: Vascular;  Laterality: N/A;   LOWER EXTREMITY ANGIOGRAPHY Right 09/01/2020   Procedure: Lower Extremity Angiography;  Surgeon: Algernon Huxley, MD;  Location: [place] [facility];  Service: Cardiovascular;  Laterality: Right;   LOWER EXTREMITY ANGIOGRAPHY Left 10/01/2020   Procedure: LOWER EXTREMITY ANGIOGRAPHY;  Surgeon: Algernon Huxley, MD;  Location: [place] [facility];  Service: Cardiovascular;  Laterality: Left;   LOWER EXTREMITY INTERVENTION Right 09/07/2020   Procedure: LOWER EXTREMITY INTERVENTION;   Surgeon: Serafina Mitchell, MD;  Location: [place] [facility];  Service: Cardiovascular;  Laterality: Right;    Family History  Problem Relation Age of Onset   Breast cancer Mother 28   Breast cancer Maternal Aunt    Breast cancer Maternal Grandmother    Breast cancer Maternal Aunt        Maternal Great Aunt    Breast cancer Cousin 31    No Known Allergies  CBC Latest Ref Rng & Units 01/14/2021 11/20/2020 10/01/2020  WBC 4.0 - 10.5 K/uL 7.5 7.8 16.2(H)  Hemoglobin 12.0 - 15.0 g/dL 13.9 12.9 9.8(L)  Hematocrit 36.0 - 46.0 % 42.3 40.6 31.4(L)  Platelets 150 - 400 K/uL 351 372 382      CMP     Component Value Date/Time   NA 137 01/14/2021 1931   NA 133 (L) 11/08/2013 1424   K 3.7 01/14/2021 1931   K 3.7 11/08/2013 1424   CL 100 01/14/2021 1931   CL 100 11/08/2013 1424   CO2 28 01/14/2021 1931   CO2 23 11/08/2013 1424   GLUCOSE 115 (H) 01/14/2021 1931   GLUCOSE 92 11/08/2013 1424   BUN 14 01/14/2021 1931   BUN 10 11/08/2013 1424   CREATININE 0.48 01/14/2021 1931   CREATININE 0.73 11/08/2013 1424   CALCIUM 9.1 01/14/2021 1931   CALCIUM 8.3 (L) 11/08/2013 1424   PROT 6.9 01/14/2021 1931   PROT 7.1 09/17/2013 0853   ALBUMIN 3.8 01/14/2021 1931   ALBUMIN 3.3 (L) 09/17/2013 0853   AST 22 01/14/2021 1931   AST 22 09/17/2013 0853   ALT 23 01/14/2021 1931   ALT 21 09/17/2013 0853   ALKPHOS 93 01/14/2021 1931   ALKPHOS 76 09/17/2013 0853   BILITOT 0.6 01/14/2021 1931   BILITOT 0.1 (L) 09/17/2013 0853   GFRNONAA >60 01/14/2021 1931   GFRNONAA >60 11/08/2013 1424   GFRAA >60 03/25/2017 1955   GFRAA >60 11/08/2013 1424     No results found.     Assessment & Plan:   1. Atherosclerosis of native artery of left leg with rest pain (HCC) Today the patient's noninvasive studies are actually improved from previous studies.  The area of swelling is not affecting her current perfusion.  We will have the patient follow-up with her previously scheduled follow-up or sooner if  other issues arise.   Current Outpatient Medications on File Prior to Visit  Medication Sig Dispense Refill   apixaban (  ELIQUIS) 5 MG TABS tablet Take 1 tablet (5 mg total) by mouth 2 (two) times daily. 60 tablet 4   aspirin EC 81 MG EC tablet Take 1 tablet (81 mg total) by mouth daily. Swallow whole. 90 tablet 3   atorvastatin (LIPITOR) 40 MG tablet Take 1 tablet (40 mg total) by mouth once daily. 30 tablet 4   cholecalciferol (VITAMIN D) 25 MCG (1000 UNIT) tablet Take 1,000 Units by mouth daily. For Vitamin D maintenance after completion of Vitamin D treatment.     citalopram (CELEXA) 40 MG tablet Take 1 tablet (40 mg total) by mouth once daily. 30 tablet 2   ferrous gluconate (FERGON) 324 MG tablet Take 1 tablet (324 mg total) by mouth 2 (two) times daily with a meal. 60 tablet 3   folic acid (FOLVITE) A999333 MCG tablet Take 400 mcg by mouth daily.     gabapentin (NEURONTIN) 100 MG capsule SMARTSIG:3 By Mouth Every 8 Hours PRN     gabapentin (NEURONTIN) 300 MG capsule Take 300 mg by mouth 3 (three) times daily.     HYDROcodone-acetaminophen (NORCO/VICODIN) 5-325 MG tablet Take 1-2 tablets by mouth every 6 (six) hours as needed for severe pain or moderate pain. 30 tablet 0   IRON 100/C 100-250 MG TABS SMARTSIG:1 Tablet(s) By Mouth Every 12 Hours     nicotine (NICODERM CQ - DOSED IN MG/24 HOURS) 21 mg/24hr patch Place 1 patch (21 mg total) onto the skin daily. 28 patch 0   predniSONE (DELTASONE) 1 MG tablet Take 1 mg by mouth daily with breakfast.     [DISCONTINUED] albuterol (PROVENTIL HFA;VENTOLIN HFA) 108 (90 Base) MCG/ACT inhaler Inhale 2 puffs into the lungs every 6 (six) hours as needed. 1 Inhaler 0   No current facility-administered medications on file prior to visit.    There are no Patient Instructions on file for this visit. No follow-ups on file.   Kris Hartmann, NP

## 2021-04-20 ENCOUNTER — Telehealth: Payer: Self-pay | Admitting: Pharmacy Technician

## 2021-04-20 NOTE — Telephone Encounter (Signed)
Patient failed to provide 2023 proof of income.  No additional medication assistance will be provided by [facility] without the required proof of income documentation.  Patient notified by letter. ° °Tiaja Hagan J. Jahmil Macleod °Care Manager °[facility]  °

## 2021-04-21 ENCOUNTER — Telehealth: Payer: Self-pay | Admitting: Pharmacy Technician

## 2021-04-21 NOTE — Telephone Encounter (Signed)
°  Alexandra [name] [place], [place]  [address]  April 17, 2021    Dear [name]:  This is to inform you that you are no longer eligible to receive medication assistance at [facility].  The reason(s) are:    _____Your total gross monthly household income exceeds 300% of the Federal Poverty Level.   _____Tangible assets (savings, checking, stocks/bonds, pension, retirement, etc.) exceeds our limit  _____You are eligible to receive benefits from [facility], [facility] or HIV Medication              Assistance Program _____You are eligible to receive benefits from a Medicare Part D plan _____You have prescription insurance  _____You are not an [facility] resident __X__Failure to provide all requested documentation (proof of income for 2023, and/or Patient Intake Application, DOH Attestation, Contract, etc).    Medication assistance will resume once all requested documentation has been returned to our clinic.  If you have questions, please contact our clinic at [phone].    Thank you,  [facility]

## 2021-04-28 ENCOUNTER — Other Ambulatory Visit: Payer: Self-pay

## 2021-05-20 ENCOUNTER — Telehealth (INDEPENDENT_AMBULATORY_CARE_PROVIDER_SITE_OTHER): Payer: Self-pay

## 2021-05-20 ENCOUNTER — Other Ambulatory Visit (INDEPENDENT_AMBULATORY_CARE_PROVIDER_SITE_OTHER): Payer: Self-pay

## 2021-05-20 MED ORDER — ATORVASTATIN CALCIUM 40 MG PO TABS: 40.0000 mg | ORAL_TABLET | Freq: Every day | ORAL | 4 refills | Status: DC

## 2021-05-20 NOTE — Telephone Encounter (Signed)
Patient reach asking if we could send a refill for Atorvastatin. Patient informed that her provider has retired and she has been out for 6 days. Sheppard Plumber NP is fine with sending a refill and patient informed that she has sent a message to her PCP. Patient has been made aware the refill has been sent to pharmacy. ?

## 2021-07-01 ENCOUNTER — Other Ambulatory Visit (INDEPENDENT_AMBULATORY_CARE_PROVIDER_SITE_OTHER): Payer: Self-pay | Admitting: Nurse Practitioner

## 2021-07-01 DIAGNOSIS — I70222 Atherosclerosis of native arteries of extremities with rest pain, left leg: Secondary | ICD-10-CM

## 2021-07-03 ENCOUNTER — Ambulatory Visit (INDEPENDENT_AMBULATORY_CARE_PROVIDER_SITE_OTHER): Payer: MEDICAID | Admitting: Nurse Practitioner

## 2021-07-03 ENCOUNTER — Ambulatory Visit (INDEPENDENT_AMBULATORY_CARE_PROVIDER_SITE_OTHER): Payer: Medicaid Other

## 2021-07-03 DIAGNOSIS — I70222 Atherosclerosis of native arteries of extremities with rest pain, left leg: Secondary | ICD-10-CM | POA: Diagnosis not present

## 2021-07-10 ENCOUNTER — Encounter: Payer: Self-pay | Admitting: Oncology

## 2021-07-10 ENCOUNTER — Ambulatory Visit (INDEPENDENT_AMBULATORY_CARE_PROVIDER_SITE_OTHER): Payer: MEDICAID | Admitting: Nurse Practitioner

## 2021-07-10 ENCOUNTER — Encounter (INDEPENDENT_AMBULATORY_CARE_PROVIDER_SITE_OTHER): Payer: Self-pay

## 2021-07-13 ENCOUNTER — Ambulatory Visit: Payer: Self-pay | Admitting: Urology

## 2021-07-20 ENCOUNTER — Telehealth (INDEPENDENT_AMBULATORY_CARE_PROVIDER_SITE_OTHER): Payer: Self-pay

## 2021-07-20 NOTE — Telephone Encounter (Signed)
The knot on her hand and her amputation site are likely unrelated.  I would not be concerned with a knot on her amputation site unless it is painful, red, oozing or growing in size.  If these things are not happening we can continue to have patient follow-up along her normal follow-up schedule.

## 2021-07-20 NOTE — Telephone Encounter (Signed)
Patient made aware with medical recommendations and verbalized understanding 

## 2021-07-31 ENCOUNTER — Encounter (INDEPENDENT_AMBULATORY_CARE_PROVIDER_SITE_OTHER): Payer: Self-pay | Admitting: *Deleted

## 2021-08-24 ENCOUNTER — Ambulatory Visit: Payer: Self-pay | Admitting: Urology

## 2022-01-01 ENCOUNTER — Encounter (INDEPENDENT_AMBULATORY_CARE_PROVIDER_SITE_OTHER): Payer: MEDICAID

## 2022-01-01 ENCOUNTER — Ambulatory Visit (INDEPENDENT_AMBULATORY_CARE_PROVIDER_SITE_OTHER): Payer: MEDICAID | Admitting: Nurse Practitioner

## 2022-01-11 ENCOUNTER — Encounter (INDEPENDENT_AMBULATORY_CARE_PROVIDER_SITE_OTHER): Payer: Self-pay

## 2022-03-16 ENCOUNTER — Ambulatory Visit (INDEPENDENT_AMBULATORY_CARE_PROVIDER_SITE_OTHER): Payer: Medicaid Other | Admitting: Vascular Surgery

## 2022-03-16 ENCOUNTER — Encounter (INDEPENDENT_AMBULATORY_CARE_PROVIDER_SITE_OTHER): Payer: Self-pay | Admitting: Vascular Surgery

## 2022-03-16 VITALS — BP 107/63 | HR 94 | Resp 16

## 2022-03-16 DIAGNOSIS — F32A Depression, unspecified: Secondary | ICD-10-CM | POA: Diagnosis not present

## 2022-03-16 DIAGNOSIS — I70222 Atherosclerosis of native arteries of extremities with rest pain, left leg: Secondary | ICD-10-CM

## 2022-03-16 MED ORDER — CITALOPRAM HYDROBROMIDE 40 MG PO TABS: 40.0000 mg | ORAL_TABLET | Freq: Every day | ORAL | 5 refills | Status: DC

## 2022-03-16 NOTE — Progress Notes (Addendum)
MRN : [id number]  Alexandra [name] is a 52 y.o. (22-May-1970) female who presents with chief complaint of  Chief Complaint  Patient presents with   Follow-up    Need to new right leg sleeve  .  History of Present Illness: Patient returns today in follow up of PAD and previous right below-knee amputation.  Her sleeve is poorly fitting for her socket and prosthesis in the right lower extremity and she needs a new prescription for that today.  She is getting around reasonably well with the prosthesis.  She does have some occasional pain in her left foot and toes.  No open wounds or infection.  Current Outpatient Medications  Medication Sig Dispense Refill   apixaban (ELIQUIS) 5 MG TABS tablet Take 1 tablet (5 mg total) by mouth 2 (two) times daily. 60 tablet 4   aspirin EC 81 MG EC tablet Take 1 tablet (81 mg total) by mouth daily. Swallow whole. 90 tablet 3   citalopram (CELEXA) 40 MG tablet Take 1 tablet (40 mg total) by mouth once daily. 30 tablet 2   citalopram (CELEXA) 40 MG tablet Take 1 tablet (40 mg total) by mouth daily. 30 tablet 5   ferrous gluconate (FERGON) 324 MG tablet Take 1 tablet (324 mg total) by mouth 2 (two) times daily with a meal. 60 tablet 3   folic acid (FOLVITE) 193 MCG tablet Take 400 mcg by mouth daily.     gabapentin (NEURONTIN) 100 MG capsule SMARTSIG:3 By Mouth Every 8 Hours PRN     gabapentin (NEURONTIN) 300 MG capsule Take 300 mg by mouth 3 (three) times daily.     methotrexate (RHEUMATREX) 10 MG tablet Take 60 mg by mouth once a week. Caution: Chemotherapy. Protect from light.     predniSONE (DELTASONE) 1 MG tablet Take 1 mg by mouth daily with breakfast.     atorvastatin (LIPITOR) 40 MG tablet Take 1 tablet by mouth once daily 90 tablet 0   cholecalciferol (VITAMIN D) 25 MCG (1000 UNIT) tablet Take 1,000 Units by mouth daily. For Vitamin D maintenance after completion of Vitamin D treatment. (Patient not taking: Reported on 03/16/2022)     diphenhydrAMINE  (BENADRYL) 25 MG tablet Take 1 tablet (25 mg total) by mouth every 6 (six) hours for 3 days. 12 tablet 0   EPINEPHrine 0.3 mg/0.3 mL IJ SOAJ injection Inject 0.3 mg into the muscle as needed for anaphylaxis. Follow package instructions as needed for severe allergy or anaphylactic reaction. 2 each 3   Ferrous Gluconate (IRON) 240 (27 Fe) MG TABS Take 1 tablet by mouth daily.     HYDROcodone-acetaminophen (NORCO/VICODIN) 5-325 MG tablet Take 1-2 tablets by mouth every 6 (six) hours as needed for severe pain or moderate pain. (Patient not taking: Reported on 03/16/2022) 30 tablet 0   IRON 100/C 100-250 MG TABS SMARTSIG:1 Tablet(s) By Mouth Every 12 Hours (Patient not taking: Reported on 03/16/2022)     nicotine (NICODERM CQ - DOSED IN MG/24 HOURS) 21 mg/24hr patch Place 1 patch (21 mg total) onto the skin daily. (Patient not taking: Reported on 03/16/2022) 28 patch 0   No current facility-administered medications for this visit.    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[place])     Past Surgical History:  Procedure  Laterality Date   ABDOMINAL HYSTERECTOMY     AMPUTATION Right 09/12/2020   Procedure: AMPUTATION BELOW KNEE;  Surgeon: Annice Needy, MD;  Location: [facility] OR[facility];  Service: Vascular;  Laterality: Right;   BREAST CYST EXCISION Left 2006   ENDOVASCULAR STENT INSERTION N/A 09/08/2020   Procedure: ENDOVASCULAR STENT GRAFT INSERTION;  Surgeon: Annice Needy, MD;  Location: [facility];  Service: Vascular;  Laterality: N/A;   LOWER EXTREMITY ANGIOGRAPHY Right 09/01/2020   Procedure: Lower Extremity Angiography;  Surgeon: Annice Needy, MD;  Location: [facility];  Service: Cardiovascular;  Laterality: Right;   LOWER EXTREMITY ANGIOGRAPHY Left 10/01/2020   Procedure: LOWER EXTREMITY ANGIOGRAPHY;  Surgeon: Annice Needy, MD;   Location: [facility];  Service: Cardiovascular;  Laterality: Left;   LOWER EXTREMITY INTERVENTION Right 09/07/2020   Procedure: LOWER EXTREMITY INTERVENTION;  Surgeon: Nada Libman, MD;  Location: [facility];  Service: Cardiovascular;  Laterality: Right;     Social History   Tobacco Use   Smoking status: Former    Packs/day: 1.00    Years: 29.00    Total pack years: 29.00    Types: Cigarettes   Smokeless tobacco: Never  Vaping Use   Vaping Use: Never used  Substance Use Topics   Alcohol use: Not Currently    Comment: social   Drug use: No      Family History  Problem Relation Age of Onset   Breast cancer Mother 64   Breast cancer Maternal Aunt    Breast cancer Maternal Grandmother    Breast cancer Maternal Aunt        Maternal Great Aunt    Breast cancer Cousin 31     No Known Allergies   REVIEW OF SYSTEMS (Negative unless checked)  Constitutional: [] Weight loss  [] Fever  [] Chills Cardiac: [] Chest pain   [] Chest pressure   [] Palpitations   [] Shortness of breath when laying flat   [] Shortness of breath at rest   [] Shortness of breath with exertion. Vascular:  [] Pain in legs with walking   [] Pain in legs at rest   [] Pain in legs when laying flat   [] Claudication   [] Pain in feet when walking  [] Pain in feet at rest  [] Pain in feet when laying flat   [] History of DVT   [] Phlebitis   [] Swelling in legs   [] Varicose veins   [] Non-healing ulcers Pulmonary:   [] Uses home oxygen   [] Productive cough   [] Hemoptysis   [] Wheeze  [] COPD   [] Asthma Neurologic:  [] Dizziness  [] Blackouts   [] Seizures   [] History of stroke   [] History of TIA  [] Aphasia   [] Temporary blindness   [] Dysphagia   [] Weakness or numbness in arms   [] Weakness or numbness in legs Musculoskeletal:  [] Arthritis   [] Joint swelling   [x] Joint pain   [] Low back pain Hematologic:  [] Easy bruising  [] Easy bleeding   [] Hypercoagulable state   [] Anemic   Gastrointestinal:  [] Blood in stool    [] Vomiting blood  [] Gastroesophageal reflux/heartburn   [] Abdominal pain Genitourinary:  [] Chronic kidney disease   [] Difficult urination  [] Frequent urination  [] Burning with urination   [] Hematuria Skin:  [] Rashes   [] Ulcers   [] Wounds Psychological:  [] History of anxiety   [x]  History of major depression.  Physical Examination  BP 107/63 (BP Location: Left Arm)   Pulse 94   Resp 16   LMP 01/14/2009  Gen:  WD/WN, NAD Head: [place]/AT, No temporalis wasting. Ear/Nose/Throat: Hearing grossly  intact, nares w/o erythema or drainage Eyes: Conjunctiva clear. Sclera non-icteric Neck: Supple.  Trachea midline Pulmonary:  Good air movement, no use of accessory muscles.  Cardiac: RRR, no JVD Vascular:  Vessel Right Left  Radial Palpable Palpable                          PT Not Palpable 1+ Palpable  DP Not Palpable 1+ Palpable   Gastrointestinal: soft, non-tender/non-distended. No guarding/reflex.  Musculoskeletal: M/S 5/5 throughout.  No deformity or atrophy. Right BKA with prosthesis in place.  No LLE edema. Neurologic: Sensation grossly intact in extremities.  Symmetrical.  Speech is fluent.  Psychiatric: Judgment intact, Mood & affect appropriate for pt's clinical situation. Dermatologic: No rashes or ulcers noted.  No cellulitis or open wounds.      Labs Recent Results (from the past 2160 hour(s))  Resp panel by RT-PCR (RSV, Flu A&B, Covid) Anterior Nasal Swab     Status: None   Collection Time: 03/19/22  4:41 PM   Specimen: Anterior Nasal Swab  Result Value Ref Range   SARS Coronavirus 2 by RT PCR NEGATIVE NEGATIVE    Comment: (NOTE) SARS-CoV-2 target nucleic acids are NOT DETECTED.  The SARS-CoV-2 RNA is generally detectable in upper respiratory specimens during the acute phase of infection. The lowest concentration of SARS-CoV-2 viral copies this assay can detect is 138 copies/mL. A negative result does not preclude SARS-Cov-2 infection and should not be used as the  sole basis for treatment or other patient management decisions. A negative result may occur with  improper specimen collection/handling, submission of specimen other than nasopharyngeal swab, presence of viral mutation(s) within the areas targeted by this assay, and inadequate number of viral copies(<138 copies/mL). A negative result must be combined with clinical observations, patient history, and epidemiological information. The expected result is Negative.  Fact Sheet for Patients:  BloggerCourse.com  Fact Sheet for Healthcare Providers:  SeriousBroker.it  This test is no t yet approved or cleared by the [place] FDA and  has been authorized for detection and/or diagnosis of SARS-CoV-2 by FDA under an Emergency Use Authorization (EUA). This EUA will remain  in effect (meaning this test can be used) for the duration of the COVID-19 declaration under Section 564(b)(1) of the Act, 21 U.S.C.section 360bbb-3(b)(1), unless the authorization is terminated  or revoked sooner.       Influenza A by PCR NEGATIVE NEGATIVE   Influenza B by PCR NEGATIVE NEGATIVE    Comment: (NOTE) The Xpert Xpress SARS-CoV-2/FLU/RSV plus assay is intended as an aid in the diagnosis of influenza from Nasopharyngeal swab specimens and should not be used as a sole basis for treatment. Nasal washings and aspirates are unacceptable for Xpert Xpress SARS-CoV-2/FLU/RSV testing.  Fact Sheet for Patients: BloggerCourse.com  Fact Sheet for Healthcare Providers: SeriousBroker.it  This test is not yet approved or cleared by the [place] FDA and has been authorized for detection and/or diagnosis of SARS-CoV-2 by FDA under an Emergency Use Authorization (EUA). This EUA will remain in effect (meaning this test can be used) for the duration of the COVID-19 declaration under Section 564(b)(1) of the Act, 21  U.S.C. section 360bbb-3(b)(1), unless the authorization is terminated or revoked.     Resp Syncytial Virus by PCR NEGATIVE NEGATIVE    Comment: (NOTE) Fact Sheet for Patients: BloggerCourse.com  Fact Sheet for Healthcare Providers: SeriousBroker.it  This test is not yet approved or cleared by the [place] and  has been authorized for detection and/or diagnosis of SARS-CoV-2 by FDA under an Emergency Use Authorization (EUA). This EUA will remain in effect (meaning this test can be used) for the duration of the COVID-19 declaration under Section 564(b)(1) of the Act, 21 U.S.C. section 360bbb-3(b)(1), unless the authorization is terminated or revoked.  Performed at [facility], [address] [place], [place] [address]   Basic metabolic panel     Status: Abnormal   Collection Time: 03/19/22  4:41 PM  Result Value Ref Range   Sodium 138 135 - 145 mmol/L   Potassium 3.3 (L) 3.5 - 5.1 mmol/L   Chloride 105 98 - 111 mmol/L   CO2 23 22 - 32 mmol/L   Glucose, Bld 136 (H) 70 - 99 mg/dL    Comment: Glucose reference range applies only to samples taken after fasting for at least 8 hours.   BUN 22 (H) 6 - 20 mg/dL   Creatinine, Ser 4.08 0.44 - 1.00 mg/dL   Calcium 8.1 (L) 8.9 - 10.3 mg/dL   GFR, Estimated >14 >48 mL/min    Comment: (NOTE) Calculated using the CKD-EPI Creatinine Equation (2021)    Anion gap 10 5 - 15    Comment: Performed at [facility], [address] [place], [place] [address]  CBC with Differential     Status: Abnormal   Collection Time: 03/19/22  4:41 PM  Result Value Ref Range   WBC 10.5 4.0 - 10.5 K/uL   RBC 3.98 3.87 - 5.11 MIL/uL   Hemoglobin 12.0 12.0 - 15.0 g/dL   HCT 14.9 70.2 - 63.7 %   MCV 94.5 80.0 - 100.0 fL   MCH 30.2 26.0 - 34.0 pg   MCHC 31.9 30.0 - 36.0 g/dL   RDW 85.8 85.0 - 27.7 %   Platelets 383 150 - 400 K/uL   nRBC 0.0 0.0 - 0.2 %   Neutrophils Relative %  76 %   Neutro Abs 8.0 (H) 1.7 - 7.7 K/uL   Lymphocytes Relative 13 %   Lymphs Abs 1.4 0.7 - 4.0 K/uL   Monocytes Relative 10 %   Monocytes Absolute 1.0 0.1 - 1.0 K/uL   Eosinophils Relative 0 %   Eosinophils Absolute 0.0 0.0 - 0.5 K/uL   Basophils Relative 0 %   Basophils Absolute 0.0 0.0 - 0.1 K/uL   Immature Granulocytes 1 %   Abs Immature Granulocytes 0.05 0.00 - 0.07 K/uL    Comment: Performed at [facility], [address], [place], [place] [address]    Radiology DG Chest Portable 1 View  Result Date: 03/19/2022 CLINICAL DATA:  Shortness of breath EXAM: PORTABLE CHEST 1 VIEW COMPARISON:  Previous studies including chest radiograph done on 01/14/2021 and CT done on 09/02/2020 FINDINGS: Cardiac size is within normal limits. Increased interstitial markings are seen in both lungs, most likely suggesting chronic interstitial lung disease with scarring. There is interval worsening of this finding. Possibility of superimposed interstitial pneumonia is not excluded. There is no focal consolidation. There is no pleural effusion or pneumothorax. IMPRESSION: Increased interstitial markings are seen in both lower lung fields suggesting scarring from chronic interstitial lung disease. There is interval worsening of this finding which may suggest progression of scarring or superimposed interstitial pneumonia. There is no focal pulmonary consolidation. There is no pleural effusion or pneumothorax. Electronically Signed   By: Ernie Avena M.D.   On: 03/19/2022 16:16    Assessment/Plan  Atherosclerosis of native arteries of extremity with  rest pain ([place]) Had significant aortic thrombus requiring aortoiliac intervention.  At this point, has a well-healed right knee amputation.  Her socket is poorly fitting however.  She could use a prosthetic socket replacement supplies.  Left lower extremity perfusion has not been checked in some time and we will set that up for the near future at her  convenience.  I discussed the importance of continuing medication and remaining abstinent from tobacco.  Discussed importance of activity.  Will see her back after her noninvasive studies.  Depression She asks if we can refill her citalopram and we can do so today but this should ultimately be managed by her primary care provider going forward    Leotis Pain, MD  04/16/2022 11:58 AM    This note was created with Dragon medical transcription system.  Any errors from dictation are purely unintentional

## 2022-03-16 NOTE — Assessment & Plan Note (Signed)
She asks if we can refill her citalopram and we can do so today but this should ultimately be managed by her primary care provider going forward

## 2022-03-16 NOTE — Assessment & Plan Note (Addendum)
Had significant aortic thrombus requiring aortoiliac intervention.  At this point, has a well-healed right knee amputation.  Her socket is poorly fitting however.  She could use a prosthetic socket replacement supplies.  Left lower extremity perfusion has not been checked in some time and we will set that up for the near future at her convenience.  I discussed the importance of continuing medication and remaining abstinent from tobacco.  Discussed importance of activity.  Will see her back after her noninvasive studies.

## 2022-03-19 ENCOUNTER — Other Ambulatory Visit: Payer: Self-pay

## 2022-03-19 ENCOUNTER — Other Ambulatory Visit: Payer: Self-pay | Admitting: Student

## 2022-03-19 ENCOUNTER — Emergency Department
Admission: EM | Admit: 2022-03-19 | Discharge: 2022-03-19 | Disposition: A | Discharge status: Home / Self Care | Payer: Medicaid Other | Attending: Emergency Medicine | Admitting: Emergency Medicine

## 2022-03-19 ENCOUNTER — Emergency Department: Payer: Medicaid Other

## 2022-03-19 DIAGNOSIS — R519 Headache, unspecified: Secondary | ICD-10-CM

## 2022-03-19 DIAGNOSIS — Z1152 Encounter for screening for COVID-19: Secondary | ICD-10-CM | POA: Diagnosis not present

## 2022-03-19 DIAGNOSIS — T7840XA Allergy, unspecified, initial encounter: Secondary | ICD-10-CM | POA: Insufficient documentation

## 2022-03-19 DIAGNOSIS — R0602 Shortness of breath: Secondary | ICD-10-CM | POA: Diagnosis present

## 2022-03-19 LAB — CBC WITH DIFFERENTIAL/PLATELET
Abs Immature Granulocytes: 0.05 10*3/uL (ref 0.00–0.07)
Basophils Absolute: 0 10*3/uL (ref 0.0–0.1)
Basophils Relative: 0 %
Eosinophils Absolute: 0 10*3/uL (ref 0.0–0.5)
Eosinophils Relative: 0 %
HCT: 37.6 % (ref 36.0–46.0)
Hemoglobin: 12 g/dL (ref 12.0–15.0)
Immature Granulocytes: 1 %
Lymphocytes Relative: 13 %
Lymphs Abs: 1.4 10*3/uL (ref 0.7–4.0)
MCH: 30.2 pg (ref 26.0–34.0)
MCHC: 31.9 g/dL (ref 30.0–36.0)
MCV: 94.5 fL (ref 80.0–100.0)
Monocytes Absolute: 1 10*3/uL (ref 0.1–1.0)
Monocytes Relative: 10 %
Neutro Abs: 8 10*3/uL — ABNORMAL HIGH (ref 1.7–7.7)
Neutrophils Relative %: 76 %
Platelets: 383 10*3/uL (ref 150–400)
RBC: 3.98 MIL/uL (ref 3.87–5.11)
RDW: 15 % (ref 11.5–15.5)
WBC: 10.5 10*3/uL (ref 4.0–10.5)
nRBC: 0 % (ref 0.0–0.2)

## 2022-03-19 LAB — BASIC METABOLIC PANEL
Anion gap: 10 (ref 5–15)
BUN: 22 mg/dL — ABNORMAL HIGH (ref 6–20)
CO2: 23 mmol/L (ref 22–32)
Calcium: 8.1 mg/dL — ABNORMAL LOW (ref 8.9–10.3)
Chloride: 105 mmol/L (ref 98–111)
Creatinine, Ser: 0.61 mg/dL (ref 0.44–1.00)
GFR, Estimated: >60 mL/min (ref >60)
Glucose, Bld: 136 mg/dL — ABNORMAL HIGH (ref 70–99)
Potassium: 3.3 mmol/L — ABNORMAL LOW (ref 3.5–5.1)
Sodium: 138 mmol/L (ref 135–145)

## 2022-03-19 LAB — RESP PANEL BY RT-PCR (RSV, FLU A&B, COVID)  RVPGX2
Influenza A by PCR: NEGATIVE
Influenza B by PCR: NEGATIVE
Resp Syncytial Virus by PCR: NEGATIVE
SARS Coronavirus 2 by RT PCR: NEGATIVE

## 2022-03-19 MED ORDER — IPRATROPIUM-ALBUTEROL 0.5-2.5 (3) MG/3ML IN SOLN
3.0000 mL | Freq: Once | RESPIRATORY_TRACT | Status: AC
  Administered 2022-03-19: 3 mL via RESPIRATORY_TRACT
  Filled 2022-03-19: qty 3

## 2022-03-19 MED ORDER — DIPHENHYDRAMINE HCL 25 MG PO TABS: 25.0000 mg | ORAL_TABLET | Freq: Four times a day (QID) | ORAL | 0 refills | Status: DC

## 2022-03-19 MED ORDER — DIPHENHYDRAMINE HCL 50 MG/ML IJ SOLN
50.0000 mg | Freq: Once | INTRAMUSCULAR | Status: AC
  Administered 2022-03-19: 50 mg via INTRAVENOUS
  Filled 2022-03-19: qty 1

## 2022-03-19 MED ORDER — EPINEPHRINE 0.3 MG/0.3ML IJ SOAJ: 0.3000 mg | INTRAMUSCULAR | 3 refills | Status: DC | PRN

## 2022-03-19 MED ORDER — FAMOTIDINE 20 MG PO TABS
40.0000 mg | ORAL_TABLET | Freq: Once | ORAL | Status: AC
  Administered 2022-03-19: 40 mg via ORAL
  Filled 2022-03-19: qty 2

## 2022-03-19 MED ORDER — DOXYCYCLINE HYCLATE 100 MG PO CAPS: 100.0000 mg | ORAL_CAPSULE | Freq: Two times a day (BID) | ORAL | 0 refills | Status: AC

## 2022-03-19 MED ORDER — SODIUM CHLORIDE 0.9 % IV BOLUS
1000.0000 mL | Freq: Once | INTRAVENOUS | Status: AC
  Administered 2022-03-19: 1000 mL via INTRAVENOUS

## 2022-03-19 MED ORDER — METHYLPREDNISOLONE SODIUM SUCC 125 MG IJ SOLR
125.0000 mg | INTRAMUSCULAR | Status: AC
  Administered 2022-03-19: 125 mg via INTRAVENOUS
  Filled 2022-03-19: qty 2

## 2022-03-19 MED ORDER — PREDNISONE 20 MG PO TABS: 40.0000 mg | ORAL_TABLET | Freq: Every day | ORAL | 0 refills | Status: AC

## 2022-03-19 NOTE — ED Provider Notes (Signed)
[facility] Provider Note    Event Date/Time   First MD Initiated Contact with Patient 03/19/22 1551     (approximate)   History   Chief Complaint: Allergic Reaction   HPI  Alexandra [name] is a 52 y.o. female with a past history of rheumatoid arthritis, Wegener's granulomatosis who comes ED complaining of chest tightness and shortness of breath along with a feeling of throat swelling that started at about 3:00 PM today.  Patient had a Rituxan infusion yesterday which was her second infusion.  Denies chest pain, no pleuritic pain.  No fever.  No productive cough.  No known allergies.     Physical Exam   Triage Vital Signs: ED Triage Vitals [03/19/22 1523]  Enc Vitals Group     BP (!) 196/174     Pulse Rate 97     Resp 18     Temp 98 F (36.7 C)     Temp src      SpO2 97 %     Weight      Height      Head Circumference      Peak Flow      Pain Score 4     Pain Loc      Pain Edu?      Excl. in [place]?     Most recent vital signs: Vitals:   03/19/22 1523 03/19/22 1529  BP: (!) 196/174 (!) 102/56  Pulse: 97   Resp: 18   Temp: 98 F (36.7 C)   SpO2: 97%     General: Awake, no distress.  CV:  Good peripheral perfusion.  Regular rate and rhythm.  Normal peripheral pulse and capillary refill Resp:  Normal effort.  Clear to auscultation bilaterally.  There is wheezing with cough. Abd:  No distention.  Soft nontender Other:  No peripheral edema.  No rash.  Moist oral mucosa.  No oropharyngeal swelling or tongue elevation.   ED Results / Procedures / Treatments   Labs (all labs ordered are listed, but only abnormal results are displayed) Labs Reviewed  BASIC METABOLIC PANEL - Abnormal; Notable for the following components:      Result Value   Potassium 3.3 (*)    Glucose, Bld 136 (*)    BUN 22 (*)    Calcium 8.1 (*)    All other components within normal limits  CBC WITH DIFFERENTIAL/PLATELET - Abnormal; Notable for the following  components:   Neutro Abs 8.0 (*)    All other components within normal limits  RESP PANEL BY RT-PCR (RSV, FLU A&B, COVID)  [id number]     EKG    RADIOLOGY Chest x-ray interpreted by me, appears normal.  Radiology report reviewed   PROCEDURES:  Procedures   MEDICATIONS ORDERED IN ED: Medications  famotidine (PEPCID) tablet 40 mg (has no administration in time range)  methylPREDNISolone sodium succinate (SOLU-MEDROL) 125 mg/2 mL injection 125 mg (125 mg Intravenous Given 03/19/22 1653)  ipratropium-albuterol (DUONEB) 0.5-2.5 (3) MG/3ML nebulizer solution 3 mL (3 mLs Nebulization Given 03/19/22 1655)  sodium chloride 0.9 % bolus 1,000 mL (1,000 mLs Intravenous New Bag/Given 03/19/22 1652)  diphenhydrAMINE (BENADRYL) injection 50 mg (50 mg Intravenous Given 03/19/22 1654)     IMPRESSION / MDM / ASSESSMENT AND PLAN / ED COURSE  I reviewed the triage vital signs and the nursing notes.  Differential diagnosis includes, but is not limited to, bronchospasm, pneumonia, allergic reaction, pleural effusion, pulmonary edema, viral illness  Patient's presentation is most consistent with acute presentation with potential threat to life or bodily function.  Patient presents with symptoms of shortness of breath and breathing tightness.  Possible allergic reaction to yesterday's infusion.  Chest x-ray labs vital signs all normal.  Viral swab negative.  Patient given Solu-Medrol and Benadryl, feeling better.  On repeat assessment 5:50 PM, lungs are clear to auscultation bilaterally without any wheezing.  No oropharyngeal swelling.  No rash.  Does not require admission, stable for discharge on prednisone Benadryl, doxycycline prophylaxis.  EpiPen as needed, can continue to follow-up with her doctor.       FINAL CLINICAL IMPRESSION(S) / ED DIAGNOSES   Final diagnoses:  Allergic reaction, initial encounter     Rx / DC Orders   ED Discharge Orders          Ordered     diphenhydrAMINE (BENADRYL) 25 MG tablet  Every 6 hours        03/19/22 1751    predniSONE (DELTASONE) 20 MG tablet  Daily with breakfast        03/19/22 1751    doxycycline (VIBRAMYCIN) 100 MG capsule  2 times daily        03/19/22 1751    EPINEPHrine 0.3 mg/0.3 mL IJ SOAJ injection  As needed        03/19/22 1751             Note:  This document was prepared using Dragon voice recognition software and may include unintentional dictation errors.   Carrie Mew, MD 03/19/22 1754

## 2022-03-19 NOTE — ED Triage Notes (Addendum)
Pt comes with c/o possible allergic reaction. Pt states this started about 30 minutes ago. Pt states she feels her throat is closing up and her chest feels on fire and tight.    Pt has some swelling noted under eyes and little redness. Pt is speaking in complete sentences.

## 2022-03-29 LAB — HEPATITIS B SURFACE ANTIGEN

## 2022-04-07 ENCOUNTER — Other Ambulatory Visit (INDEPENDENT_AMBULATORY_CARE_PROVIDER_SITE_OTHER): Payer: Self-pay | Admitting: Nurse Practitioner

## 2022-04-16 ENCOUNTER — Telehealth (INDEPENDENT_AMBULATORY_CARE_PROVIDER_SITE_OTHER): Payer: Self-pay | Admitting: Vascular Surgery

## 2022-04-16 NOTE — Telephone Encounter (Signed)
Patient needs a prescription for new prosthetic socket replacement and supplies.  Document the patient needs new socket because current socket is too large.  ILL fitting. Please advise

## 2022-04-16 NOTE — Telephone Encounter (Signed)
I will send this message to Dr. Lucky Cowboy.  He recently saw her a month ago may be able to amend his note so that she does not need to come in for a new office visit.

## 2022-04-19 ENCOUNTER — Other Ambulatory Visit: Payer: Medicaid Other

## 2022-04-20 ENCOUNTER — Telehealth (INDEPENDENT_AMBULATORY_CARE_PROVIDER_SITE_OTHER): Payer: Self-pay

## 2022-04-20 ENCOUNTER — Ambulatory Visit
Admission: RE | Admit: 2022-04-20 | Discharge: 2022-04-20 | Disposition: A | Discharge status: Home / Self Care | Payer: Medicaid Other | Source: Ambulatory Visit | Attending: Student | Admitting: Student

## 2022-04-20 DIAGNOSIS — R519 Headache, unspecified: Secondary | ICD-10-CM

## 2022-04-20 MED ORDER — GADOPICLENOL 0.5 MMOL/ML IV SOLN
7.0000 mL | Freq: Once | INTRAVENOUS | Status: AC | PRN
  Administered 2022-04-20: 7 mL via INTRAVENOUS

## 2022-04-20 NOTE — Telephone Encounter (Signed)
Patient called requesting a new order be sent to [facility] for prosthetic socket replacement and supplies.  Communication routed to [employer].

## 2022-05-03 ENCOUNTER — Other Ambulatory Visit
Admission: RE | Admit: 2022-05-03 | Discharge: 2022-05-03 | Disposition: A | Discharge status: Home / Self Care | Payer: Medicaid Other | Source: Ambulatory Visit | Attending: Pulmonary Disease | Admitting: Pulmonary Disease

## 2022-05-03 ENCOUNTER — Encounter: Payer: Self-pay | Admitting: Oncology

## 2022-05-03 DIAGNOSIS — D75839 Thrombocytosis, unspecified: Secondary | ICD-10-CM | POA: Insufficient documentation

## 2022-05-03 DIAGNOSIS — R042 Hemoptysis: Secondary | ICD-10-CM | POA: Diagnosis not present

## 2022-05-03 LAB — D-DIMER, QUANTITATIVE: D-Dimer, Quant: 0.43 ug/mL-FEU (ref 0.00–0.50)

## 2022-05-04 ENCOUNTER — Other Ambulatory Visit: Payer: Self-pay | Admitting: Pulmonary Disease

## 2022-05-04 ENCOUNTER — Ambulatory Visit
Admission: RE | Admit: 2022-05-04 | Discharge: 2022-05-04 | Disposition: A | Discharge status: Home / Self Care | Payer: Medicaid Other | Source: Ambulatory Visit | Attending: Pulmonary Disease | Admitting: Pulmonary Disease

## 2022-05-04 DIAGNOSIS — D75839 Thrombocytosis, unspecified: Secondary | ICD-10-CM

## 2022-05-04 DIAGNOSIS — R042 Hemoptysis: Secondary | ICD-10-CM | POA: Insufficient documentation

## 2022-05-04 MED ORDER — IOHEXOL 350 MG/ML SOLN
75.0000 mL | Freq: Once | INTRAVENOUS | Status: AC | PRN
  Administered 2022-05-04: 75 mL via INTRAVENOUS

## 2022-06-12 ENCOUNTER — Other Ambulatory Visit: Payer: Self-pay

## 2022-06-12 ENCOUNTER — Emergency Department: Payer: Medicaid Other

## 2022-06-12 ENCOUNTER — Emergency Department
Admission: EM | Admit: 2022-06-12 | Discharge: 2022-06-12 | Disposition: A | Discharge status: Home / Self Care | Payer: Medicaid Other | Attending: Emergency Medicine | Admitting: Emergency Medicine

## 2022-06-12 DIAGNOSIS — R079 Chest pain, unspecified: Secondary | ICD-10-CM | POA: Diagnosis not present

## 2022-06-12 DIAGNOSIS — R1084 Generalized abdominal pain: Secondary | ICD-10-CM | POA: Diagnosis present

## 2022-06-12 LAB — URINALYSIS, COMPLETE (UACMP) WITH MICROSCOPIC
Bacteria, UA: NONE SEEN
Bilirubin Urine: NEGATIVE
Glucose, UA: NEGATIVE mg/dL
Hgb urine dipstick: NEGATIVE
Ketones, ur: 5 mg/dL — AB
Nitrite: NEGATIVE
Protein, ur: NEGATIVE mg/dL
Specific Gravity, Urine: 1.026 (ref 1.005–1.030)
pH: 5 (ref 5.0–8.0)

## 2022-06-12 LAB — CBC
HCT: 41.2 % (ref 36.0–46.0)
Hemoglobin: 13.6 g/dL (ref 12.0–15.0)
MCH: 29.4 pg (ref 26.0–34.0)
MCHC: 33 g/dL (ref 30.0–36.0)
MCV: 89.2 fL (ref 80.0–100.0)
Platelets: 364 10*3/uL (ref 150–400)
RBC: 4.62 MIL/uL (ref 3.87–5.11)
RDW: 14.6 % (ref 11.5–15.5)
WBC: 8.8 10*3/uL (ref 4.0–10.5)
nRBC: 0 % (ref 0.0–0.2)

## 2022-06-12 LAB — LIPASE, BLOOD: Lipase: 32 U/L (ref 11–51)

## 2022-06-12 LAB — COMPREHENSIVE METABOLIC PANEL
ALT: 18 U/L (ref 0–44)
AST: 25 U/L (ref 15–41)
Albumin: 3.8 g/dL (ref 3.5–5.0)
Alkaline Phosphatase: 105 U/L (ref 38–126)
Anion gap: 9 (ref 5–15)
BUN: 15 mg/dL (ref 6–20)
CO2: 24 mmol/L (ref 22–32)
Calcium: 8.5 mg/dL — ABNORMAL LOW (ref 8.9–10.3)
Chloride: 104 mmol/L (ref 98–111)
Creatinine, Ser: 0.63 mg/dL (ref 0.44–1.00)
GFR, Estimated: >60 mL/min (ref >60)
Glucose, Bld: 117 mg/dL — ABNORMAL HIGH (ref 70–99)
Potassium: 4 mmol/L (ref 3.5–5.1)
Sodium: 137 mmol/L (ref 135–145)
Total Bilirubin: 0.8 mg/dL (ref 0.3–1.2)
Total Protein: 6.7 g/dL (ref 6.5–8.1)

## 2022-06-12 LAB — TROPONIN I (HIGH SENSITIVITY): Troponin I (High Sensitivity): 3 ng/L (ref <18)

## 2022-06-12 MED ORDER — POLYETHYLENE GLYCOL 3350 17 GM/SCOOP PO POWD: 17.0000 g | Freq: Two times a day (BID) | ORAL | 0 refills | Status: DC | PRN

## 2022-06-12 MED ORDER — MORPHINE SULFATE (PF) 4 MG/ML IV SOLN
4.0000 mg | Freq: Once | INTRAVENOUS | Status: AC
  Administered 2022-06-12: 4 mg via INTRAVENOUS
  Filled 2022-06-12: qty 1

## 2022-06-12 MED ORDER — IOHEXOL 300 MG/ML  SOLN
100.0000 mL | Freq: Once | INTRAMUSCULAR | Status: AC | PRN
  Administered 2022-06-12: 100 mL via INTRAVENOUS

## 2022-06-12 MED ORDER — SODIUM CHLORIDE 0.9 % IV BOLUS
1000.0000 mL | Freq: Once | INTRAVENOUS | Status: AC
  Administered 2022-06-12: 1000 mL via INTRAVENOUS

## 2022-06-12 MED ORDER — ONDANSETRON HCL 4 MG/2ML IJ SOLN
4.0000 mg | Freq: Once | INTRAMUSCULAR | Status: AC
  Administered 2022-06-12: 4 mg via INTRAVENOUS
  Filled 2022-06-12: qty 2

## 2022-06-12 NOTE — ED Provider Notes (Signed)
[facility] Provider Note    Event Date/Time   First MD Initiated Contact with Patient 06/12/22 1957     (approximate)  History   Chief Complaint: Flank Pain and Chest Pain  HPI  ITHZEL ENGLEDOW is a 52 y.o. female with a past medical history of rheumatoid arthritis, Wegener's granulomatosis, presents to the emergency department for abdominal pain.  According to the patient over the past 2 days she has been experiencing upper and left-sided abdominal pain that radiates to the back and sometimes to the chest.  Patient states that has been fairly constant for 2 days although sometimes worse than others.  Denies any shortness of breath.  States some nausea.  No diarrhea.  No urinary symptoms no hematuria or dysuria no history of kidney stones.  Physical Exam   Triage Vital Signs: ED Triage Vitals [06/12/22 1958]  Enc Vitals Group     BP      Pulse      Resp      Temp      Temp src      SpO2      Weight      Height      Head Circumference      Peak Flow      Pain Score 8     Pain Loc      Pain Edu?      Excl. in [place]?     Most recent vital signs: There were no vitals filed for this visit.  General: Awake, no distress.  CV:  Good peripheral perfusion.  Regular rate and rhythm  Resp:  Normal effort.  Equal breath sounds bilaterally.  Abd:  No distention.  Soft, mild epigastric tenderness otherwise benign abdomen.  Mild left CVA tenderness. Other:  Status post right lower extremity amputation.  ED Results / Procedures / Treatments   EKG  EKG viewed and interpreted by myself shows a normal sinus rhythm 84 bpm the narrow QRS, left axis deviation, largely normal intervals with nonconcerning ST changes.  RADIOLOGY  I have reviewed and interpreted the CT images.  No obvious bowel obstruction or other significant abnormality seen on my evaluation. Radiology is read the CT scan is negative for acute abnormality.  Positive for moderate  constipation.   MEDICATIONS ORDERED IN ED: Medications  sodium chloride 0.9 % bolus 1,000 mL (has no administration in time range)  morphine (PF) 4 MG/ML injection 4 mg (has no administration in time range)  ondansetron (ZOFRAN) injection 4 mg (has no administration in time range)     IMPRESSION / MDM / ASSESSMENT AND PLAN / ED COURSE  I reviewed the triage vital signs and the nursing notes.  Patient's presentation is most consistent with acute presentation with potential threat to life or bodily function.  Patient presents emergency department for abdominal pain and chest pain.  According to the patient over the past 2 days she has been experiencing pain to her upper abdomen radiating around to the left side and often to her back.  States at times the pain radiates into the left chest.  Differential is quite broad at this point would include pancreatitis, colitis, diverticulitis, constipation, ureterolithiasis, UTI or pyelonephritis, ACS or pneumonia.  No pleuritic pain no shortness of breath.  Patient's lab work shows a normal CBC, reassuring chemistry, lipase is normal, troponin is normal.  Urinalysis shows no concerning findings.  Patient states the pain is largely resolved at this time although mild pain remains in  the left upper quadrant.  Given the tenderness mostly the left upper quadrant we will obtain CT imaging of the abdomen/pelvis with contrast to further evaluate.  Patient agreeable to plan of care.  CT scan positive for moderate constipation otherwise no acute findings.  I reviewed the CT images does appear to have fairly significant amount of constipation in the left upper quadrant specifically very likely could be the cause of the patient's intermittent pain.  Will discharge on MiraLAX have the patient follow-up with her doctor.  Discussed return precautions.  FINAL CLINICAL IMPRESSION(S) / ED DIAGNOSES   Abdominal pain    Note:  This document was prepared using Dragon  voice recognition software and may include unintentional dictation errors.   Harvest Dark, MD 06/12/22 2233

## 2022-06-12 NOTE — ED Notes (Addendum)
See triage note. Pt A&Ox4. Pt has been having been bad abdominal pain in upper abdomen and radiating to chest, mid back and L arm since Thursday. Pt has no appetite and experienced some mild SOB. Hx of pulmonary fibrosis.

## 2022-06-12 NOTE — ED Triage Notes (Signed)
L flank pain and lower abd pain x 2 days that is now radiating into her chest. States pain is worse when she lays down. Pt denies urinary symptoms. Reports mild associated SOB. Reports chest pain radiates down L arm. Pt alert and oriented following commands. Breathing unlabored speaking in full sentences. Ambulatory with cane at baseline.

## 2022-07-02 ENCOUNTER — Encounter (INDEPENDENT_AMBULATORY_CARE_PROVIDER_SITE_OTHER): Payer: Self-pay | Admitting: Nurse Practitioner

## 2022-07-02 ENCOUNTER — Ambulatory Visit (INDEPENDENT_AMBULATORY_CARE_PROVIDER_SITE_OTHER): Payer: Medicaid Other

## 2022-07-02 ENCOUNTER — Ambulatory Visit (INDEPENDENT_AMBULATORY_CARE_PROVIDER_SITE_OTHER): Payer: Medicaid Other | Admitting: Nurse Practitioner

## 2022-07-02 VITALS — BP 98/64 | HR 79 | Resp 18 | Ht 62.0 in | Wt 148.0 lb

## 2022-07-02 DIAGNOSIS — Z89511 Acquired absence of right leg below knee: Secondary | ICD-10-CM

## 2022-07-02 DIAGNOSIS — I70222 Atherosclerosis of native arteries of extremities with rest pain, left leg: Secondary | ICD-10-CM

## 2022-07-02 DIAGNOSIS — I739 Peripheral vascular disease, unspecified: Secondary | ICD-10-CM | POA: Diagnosis not present

## 2022-07-02 DIAGNOSIS — Z9889 Other specified postprocedural states: Secondary | ICD-10-CM | POA: Diagnosis not present

## 2022-07-05 ENCOUNTER — Encounter (INDEPENDENT_AMBULATORY_CARE_PROVIDER_SITE_OTHER): Payer: Self-pay | Admitting: Nurse Practitioner

## 2022-07-05 LAB — VAS US ABI WITH/WO TBI: Left ABI: 1.08

## 2022-07-05 NOTE — Progress Notes (Signed)
Subjective:    Patient ID: Alexandra [name], female    DOB: 21-Nov-1970, 52 y.o.   MRN: [id number] Chief Complaint  Patient presents with   Follow-up    Follow up 3yr ABI.    The patient returns to the office for followup and review of the noninvasive studies.   There have been no interval changes in lower extremity symptoms. No interval shortening of the patient's claudication distance or development of rest pain symptoms. No new ulcers or wounds have occurred since the last visit.  The patient has a right below-knee amputation is ambulating well with it however she notes that she has been having significant pain and issues with her prosthesis.  She notes that it still swollen in the morning she can barely place it in her prosthetic.  She notes that when she removes it she begins to have pain and discomfort in the area that relates to a sort of aching and throbbing sensation.  She notes that her prosthetic was recently refitted, worse previously it was too big for wear.  The patient denies amaurosis fugax or recent TIA symptoms. There are no documented recent neurological changes noted. There is no history of DVT, PE or superficial thrombophlebitis. The patient denies recent episodes of angina or shortness of breath.   ABI Rt= AKA and Lt=1.08  (previous ABI's Rt=bka and Lt=1.07) Duplex ultrasound of the right lower extremity reveals strong triphasic tibial artery waveforms    Review of Systems  Cardiovascular:  Positive for leg swelling.  Musculoskeletal:  Positive for gait problem.  All other systems reviewed and are negative.      Objective:   Physical Exam Vitals reviewed.  HENT:     Head: Normocephalic.  Cardiovascular:     Rate and Rhythm: Normal rate.  Pulmonary:     Effort: Pulmonary effort is normal.  Musculoskeletal:     Right lower leg: No edema.     Right Lower Extremity: Right leg is amputated below knee.  Skin:    General: Skin is warm and dry.     Findings:  No bruising.  Neurological:     Mental Status: She is alert and oriented to person, place, and time.  Psychiatric:        Mood and Affect: Mood normal.        Behavior: Behavior normal.        Thought Content: Thought content normal.        Judgment: Judgment normal.     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     Social History   Socioeconomic History   Marital status: Divorced    Spouse name: Not on file   Number of children: Not on file   Years of education: Not on file   Highest education level: Not on file  Occupational History   Not on file  Tobacco Use   Smoking status: Former    Packs/day: 1.00    Years: 29.00    Additional pack years: 0.00    Total pack years: 29.00    Types: Cigarettes  Smokeless tobacco: Never  Vaping Use   Vaping Use: Never used  Substance and Sexual Activity   Alcohol use: Not Currently    Comment: social   Drug use: No   Sexual activity: Yes  Other Topics Concern   Not on file  Social History Narrative   Not on file   Social Determinants of Health   Financial Resource Strain: Not on file  Food Insecurity: Not on file  Transportation Needs: Not on file  Physical Activity: Not on file  Stress: Not on file  Social Connections: Not on file  Intimate Partner Violence: Not on file    Past Surgical History:  Procedure Laterality Date   ABDOMINAL HYSTERECTOMY     AMPUTATION Right 09/12/2020   Procedure: AMPUTATION BELOW KNEE;  Surgeon: Annice Needy, MD;  Location: [facility];  Service: Vascular;  Laterality: Right;   BREAST CYST EXCISION Left 2006   ENDOVASCULAR STENT INSERTION N/A 09/08/2020   Procedure: ENDOVASCULAR STENT  GRAFT INSERTION;  Surgeon: Annice Needy, MD;  Location: [facility];  Service: Vascular;  Laterality: N/A;   LOWER EXTREMITY ANGIOGRAPHY Right 09/01/2020   Procedure: Lower Extremity Angiography;  Surgeon: Annice Needy, MD;  Location: [facility];  Service: Cardiovascular;  Laterality: Right;   LOWER EXTREMITY ANGIOGRAPHY Left 10/01/2020   Procedure: LOWER EXTREMITY ANGIOGRAPHY;  Surgeon: Annice Needy, MD;  Location: [facility];  Service: Cardiovascular;  Laterality: Left;   LOWER EXTREMITY INTERVENTION Right 09/07/2020   Procedure: LOWER EXTREMITY INTERVENTION;  Surgeon: Nada Libman, MD;  Location: [facility];  Service: Cardiovascular;  Laterality: Right;    Family History  Problem Relation Age of Onset   Breast cancer Mother 25   Breast cancer Maternal Aunt    Breast cancer Maternal Grandmother    Breast cancer Maternal Aunt        Maternal Great Aunt    Breast cancer Cousin 31    No Known Allergies     Latest Ref Rng & Units 06/12/2022    8:23 PM 03/19/2022    4:41 PM 01/14/2021    7:31 PM  CBC  WBC 4.0 - 10.5 K/uL 8.8  10.5  7.5   Hemoglobin 12.0 - 15.0 g/dL 16.1  09.6  04.5   Hematocrit 36.0 - 46.0 % 41.2  37.6  42.3   Platelets 150 - 400 K/uL 364  383  351       CMP     Component Value Date/Time   NA 137 06/12/2022 2023   NA 133 (L) 11/08/2013 1424   K 4.0 06/12/2022 2023   K 3.7 11/08/2013 1424   CL 104 06/12/2022 2023   CL 100 11/08/2013 1424   CO2 24 06/12/2022 2023   CO2 23 11/08/2013 1424   GLUCOSE 117 (H) 06/12/2022 2023   GLUCOSE 92 11/08/2013 1424   BUN 15 06/12/2022 2023   BUN 10 11/08/2013 1424   CREATININE 0.63 06/12/2022 2023   CREATININE 0.73 11/08/2013 1424   CALCIUM 8.5 (L) 06/12/2022 2023   CALCIUM 8.3 (L) 11/08/2013 1424   PROT 6.7 06/12/2022 2023   PROT 7.1 09/17/2013 0853   ALBUMIN 3.8 06/12/2022 2023   ALBUMIN 3.3 (L) 09/17/2013 0853   AST 25 06/12/2022 2023   AST 22 09/17/2013 0853   ALT 18 06/12/2022 2023    ALT 21 09/17/2013 0853   ALKPHOS 105 06/12/2022 2023   ALKPHOS 76 09/17/2013 0853   BILITOT 0.8 06/12/2022 2023  BILITOT 0.1 (L) 09/17/2013 0853   GFRNONAA >60 06/12/2022 2023   GFRNONAA >60 11/08/2013 1424   GFRAA >60 03/25/2017 1955   GFRAA >60 11/08/2013 1424     No results found.     Assessment & Plan:   1. Peripheral arterial disease with history of revascularization  Recommend:  The patient has evidence of atherosclerosis of the lower extremities with no claudication of LLE.  The patient does not voice lifestyle limiting changes at this point in time.  Noninvasive studies do not suggest clinically significant change.  No invasive studies, angiography or surgery at this time The patient should continue walking and begin a more formal exercise program.  The patient should continue antiplatelet therapy and aggressive treatment of the lipid abnormalities  No changes in the patient's medications at this time  Continued surveillance is indicated as atherosclerosis is likely to progress with time.    The patient will continue follow up with noninvasive studies as ordered.   2. Hx of BKA, right The patient is having some significant pain and discomfort in her residual limb especially with extensive walking and after taking off her prosthetic.  Will have the patient return at her convenience to evaluate for possible DVT as a cause of the swelling.  We will also obtain x-rays to ensure there has not been any fracture or residual bone fragment.  Additionally we may need to want to consider that the patient may have inflammatory issues that she has related to her rheumatoid arthritis that may be causing her swelling and symptoms. - DG Tibia/Fibula Right; Future   Current Outpatient Medications on File Prior to Visit  Medication Sig Dispense Refill   apixaban (ELIQUIS) 5 MG TABS tablet Take 1 tablet (5 mg total) by mouth 2 (two) times daily. 60 tablet 4   aspirin EC 81 MG EC  tablet Take 1 tablet (81 mg total) by mouth daily. Swallow whole. 90 tablet 3   atorvastatin (LIPITOR) 40 MG tablet Take 1 tablet by mouth once daily 90 tablet 0   citalopram (CELEXA) 40 MG tablet Take 1 tablet (40 mg total) by mouth once daily. 30 tablet 2   citalopram (CELEXA) 40 MG tablet Take 1 tablet (40 mg total) by mouth daily. 30 tablet 5   EPINEPHrine 0.3 mg/0.3 mL IJ SOAJ injection Inject 0.3 mg into the muscle as needed for anaphylaxis. Follow package instructions as needed for severe allergy or anaphylactic reaction. 2 each 3   ferrous gluconate (FERGON) 324 MG tablet Take 1 tablet (324 mg total) by mouth 2 (two) times daily with a meal. 60 tablet 3   Ferrous Gluconate (IRON) 240 (27 Fe) MG TABS Take 1 tablet by mouth daily.     folic acid (FOLVITE) 400 MCG tablet Take 400 mcg by mouth daily.     gabapentin (NEURONTIN) 100 MG capsule SMARTSIG:3 By Mouth Every 8 Hours PRN     gabapentin (NEURONTIN) 300 MG capsule Take 300 mg by mouth 3 (three) times daily.     IRON 100/C 100-250 MG TABS      methotrexate (RHEUMATREX) 10 MG tablet Take 60 mg by mouth once a week. Caution: Chemotherapy. Protect from light.     polyethylene glycol powder (GLYCOLAX/MIRALAX) 17 GM/SCOOP powder Take 17 g by mouth 2 (two) times daily as needed for mild constipation. 255 g 0   predniSONE (DELTASONE) 1 MG tablet Take 1 mg by mouth daily with breakfast.     diphenhydrAMINE (BENADRYL) 25 MG tablet Take 1  tablet (25 mg total) by mouth every 6 (six) hours for 3 days. 12 tablet 0   HYDROcodone-acetaminophen (NORCO/VICODIN) 5-325 MG tablet Take 1-2 tablets by mouth every 6 (six) hours as needed for severe pain or moderate pain. (Patient not taking: Reported on 03/16/2022) 30 tablet 0   nicotine (NICODERM CQ - DOSED IN MG/24 HOURS) 21 mg/24hr patch Place 1 patch (21 mg total) onto the skin daily. (Patient not taking: Reported on 03/16/2022) 28 patch 0   [DISCONTINUED] albuterol (PROVENTIL HFA;VENTOLIN HFA) 108 (90 Base)  MCG/ACT inhaler Inhale 2 puffs into the lungs every 6 (six) hours as needed. 1 Inhaler 0   No current facility-administered medications on file prior to visit.    There are no Patient Instructions on file for this visit. No follow-ups on file.   Georgiana Spinner, NP

## 2022-07-08 ENCOUNTER — Other Ambulatory Visit (INDEPENDENT_AMBULATORY_CARE_PROVIDER_SITE_OTHER): Payer: Self-pay | Admitting: Nurse Practitioner

## 2022-07-09 ENCOUNTER — Other Ambulatory Visit (INDEPENDENT_AMBULATORY_CARE_PROVIDER_SITE_OTHER): Payer: Self-pay | Admitting: Nurse Practitioner

## 2022-07-09 NOTE — Telephone Encounter (Signed)
Further refills should come from pt's PCP.

## 2022-07-12 NOTE — Telephone Encounter (Signed)
Follow up with Primary care Dr.

## 2022-07-23 ENCOUNTER — Other Ambulatory Visit: Payer: Self-pay | Admitting: Family Medicine

## 2022-07-23 DIAGNOSIS — Z1231 Encounter for screening mammogram for malignant neoplasm of breast: Secondary | ICD-10-CM

## 2022-07-27 ENCOUNTER — Other Ambulatory Visit (INDEPENDENT_AMBULATORY_CARE_PROVIDER_SITE_OTHER): Payer: Self-pay | Admitting: Nurse Practitioner

## 2022-07-27 DIAGNOSIS — M79604 Pain in right leg: Secondary | ICD-10-CM

## 2022-07-28 ENCOUNTER — Ambulatory Visit (INDEPENDENT_AMBULATORY_CARE_PROVIDER_SITE_OTHER): Payer: Medicaid Other | Admitting: Nurse Practitioner

## 2022-07-28 ENCOUNTER — Ambulatory Visit
Admission: RE | Admit: 2022-07-28 | Discharge: 2022-07-28 | Disposition: A | Discharge status: Home / Self Care | Payer: Medicaid Other | Source: Ambulatory Visit | Attending: Nurse Practitioner | Admitting: Nurse Practitioner

## 2022-07-28 ENCOUNTER — Encounter (INDEPENDENT_AMBULATORY_CARE_PROVIDER_SITE_OTHER): Payer: Self-pay | Admitting: Nurse Practitioner

## 2022-07-28 ENCOUNTER — Ambulatory Visit (INDEPENDENT_AMBULATORY_CARE_PROVIDER_SITE_OTHER): Payer: Medicaid Other

## 2022-07-28 VITALS — BP 101/63 | HR 79 | Resp 16 | Wt 149.8 lb

## 2022-07-28 DIAGNOSIS — Z89511 Acquired absence of right leg below knee: Secondary | ICD-10-CM

## 2022-07-28 DIAGNOSIS — M25561 Pain in right knee: Secondary | ICD-10-CM | POA: Diagnosis present

## 2022-07-28 DIAGNOSIS — M79604 Pain in right leg: Secondary | ICD-10-CM

## 2022-07-28 DIAGNOSIS — T8789 Other complications of amputation stump: Secondary | ICD-10-CM

## 2022-07-28 NOTE — Progress Notes (Signed)
Subjective:    Patient ID: Alexandra [name], female    DOB: Apr 09, 1970, 52 y.o.   MRN: [id number] Chief Complaint  Patient presents with   Follow-up    Ultrasound follow up    The patient returns today for follow-up evaluation of pain that is been ongoing in her right lower extremity.  The patient has right below-knee amputation and she ambulates fairly well with it.  Initially it was noted that she was having significant pain and issues with her prosthetic.  She notes that her leg was swollen in the morning and she could barely fit into her prosthetic.  She noted that when she removed it she began to have pain and discomfort in the area that was an aching and throbbing sensation.  She also notes that her prosthetic was recently refitted.  Today, she notes that the swelling has not been bad and she notices that when she is walking she actually does not have any pain or discomfort.  She notes that the pain is worse when she is sitting.  Today she had a DVT study which was negative but she also notes that the pain was worse after deep palpation.  The pain is more so in the posterior thigh area.  She notes that the pain is a deep aching and throbbing During physical exam the patient does not have any limitations on range of motion    Review of Systems  Cardiovascular:  Positive for leg swelling.  Musculoskeletal:  Positive for gait problem.  All other systems reviewed and are negative.      Objective:   Physical Exam Vitals reviewed.  HENT:     Head: Normocephalic.  Cardiovascular:     Rate and Rhythm: Normal rate.  Pulmonary:     Effort: Pulmonary effort is normal.  Musculoskeletal:        General: Normal range of motion.  Skin:    General: Skin is warm and dry.  Neurological:     Mental Status: She is alert and oriented to person, place, and time.  Psychiatric:        Mood and Affect: Mood normal.        Behavior: Behavior normal.        Thought Content: Thought content normal.         Judgment: Judgment normal.     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)     Social History   Socioeconomic History   Marital status: Divorced    Spouse name: Not on file   Number of children: Not on file   Years of education: Not on file   Highest education level: Not on file  Occupational History   Not on file  Tobacco Use   Smoking status: Former    Packs/day: 1.00    Years: 29.00    Additional pack years: 0.00    Total pack years: 29.00    Types: Cigarettes   Smokeless tobacco: Never  Vaping Use   Vaping Use: Never used  Substance and Sexual Activity   Alcohol use: Not Currently  Comment: social   Drug use: No   Sexual activity: Yes  Other Topics Concern   Not on file  Social History Narrative   Not on file   Social Determinants of Health   Financial Resource Strain: Not on file  Food Insecurity: Not on file  Transportation Needs: Not on file  Physical Activity: Not on file  Stress: Not on file  Social Connections: Not on file  Intimate Partner Violence: Not on file    Past Surgical History:  Procedure Laterality Date   ABDOMINAL HYSTERECTOMY     AMPUTATION Right 09/12/2020   Procedure: AMPUTATION BELOW KNEE;  Surgeon: Annice Needy, MD;  Location: [facility];  Service: Vascular;  Laterality: Right;   BREAST CYST EXCISION Left 2006   ENDOVASCULAR STENT INSERTION N/A 09/08/2020   Procedure: ENDOVASCULAR STENT GRAFT INSERTION;  Surgeon: Annice Needy, MD;  Location: [facility];  Service: Vascular;  Laterality: N/A;   LOWER EXTREMITY ANGIOGRAPHY Right 09/01/2020   Procedure: Lower Extremity Angiography;  Surgeon: Annice Needy, MD;  Location: [facility];  Service: Cardiovascular;  Laterality: Right;   LOWER EXTREMITY ANGIOGRAPHY Left 10/01/2020   Procedure: LOWER EXTREMITY ANGIOGRAPHY;  Surgeon: Annice Needy, MD;  Location: [facility];  Service: Cardiovascular;  Laterality: Left;   LOWER EXTREMITY INTERVENTION Right 09/07/2020   Procedure: LOWER EXTREMITY INTERVENTION;  Surgeon: Nada Libman, MD;  Location: [facility];  Service: Cardiovascular;  Laterality: Right;    Family History  Problem Relation Age of Onset   Breast cancer Mother 79   Breast cancer Maternal Aunt    Breast cancer Maternal Grandmother    Breast cancer Maternal Aunt        Maternal Great Aunt    Breast cancer Cousin 31    No Known Allergies     Latest Ref Rng & Units 06/12/2022    8:23 PM 03/19/2022    4:41 PM 01/14/2021    7:31 PM  CBC  WBC 4.0 - 10.5 K/uL 8.8  10.5  7.5   Hemoglobin 12.0 - 15.0 g/dL 65.7  84.6  96.2   Hematocrit 36.0 - 46.0 % 41.2  37.6  42.3   Platelets 150 - 400 K/uL 364  383  351       CMP     Component Value Date/Time   NA 137 06/12/2022 2023   NA 133 (L) 11/08/2013 1424   K 4.0 06/12/2022 2023   K 3.7 11/08/2013 1424   CL 104 06/12/2022 2023   CL 100 11/08/2013 1424   CO2 24 06/12/2022 2023   CO2 23 11/08/2013 1424   GLUCOSE 117 (H) 06/12/2022 2023   GLUCOSE 92 11/08/2013 1424   BUN 15 06/12/2022 2023   BUN 10 11/08/2013 1424   CREATININE 0.63 06/12/2022 2023   CREATININE 0.73 11/08/2013 1424   CALCIUM 8.5 (L) 06/12/2022 2023   CALCIUM 8.3 (L) 11/08/2013 1424   PROT 6.7 06/12/2022 2023   PROT 7.1 09/17/2013 0853   ALBUMIN 3.8 06/12/2022 2023   ALBUMIN 3.3 (L) 09/17/2013 0853   AST 25 06/12/2022 2023   AST 22 09/17/2013 0853   ALT 18 06/12/2022 2023   ALT 21 09/17/2013 0853   ALKPHOS 105 06/12/2022 2023   ALKPHOS 76 09/17/2013 0853   BILITOT 0.8 06/12/2022 2023   BILITOT 0.1 (L) 09/17/2013 0853   GFRNONAA >60 06/12/2022 2023   GFRNONAA >60 11/08/2013 1424   GFRAA >60  03/25/2017 1955   GFRAA >  60 11/08/2013 1424     VAS [place] ABI WITH/WO TBI  Result Date: 07/05/2022  LOWER EXTREMITY DOPPLER STUDY Patient Name:  Alexandra [name]  Date of Exam:   07/02/2022 Medical Rec #: [id number]        Accession #:    [id number] Date of Birth: [birth year]        Patient Gender: F Patient Age:   52 years Exam Location:  [facility] Procedure:      VAS [place] ABI WITH/WO TBI Referring Phys: Festus Barren --------------------------------------------------------------------------------  Indications: Peripheral artery disease.  Vascular Interventions: 09/08/20: Aorta & right CIA thrombectomy w/ distal aorta                         & bilateral CIA stents;                         09/12/20: Right BKA;                         10/01/20: Distal aorta & left iliac thrombectomy w/                         distal aorta & bilateral CIA stents;. Comparison Study: 07/03/2021 Performing Technologist: Debbe Bales RVS  Examination Guidelines: A complete evaluation includes at minimum, Doppler waveform signals and systolic blood pressure reading at the level of bilateral brachial, anterior tibial, and posterior tibial arteries, when vessel segments are accessible. Bilateral testing is considered an integral part of a complete examination. Photoelectric Plethysmograph (PPG) waveforms and toe systolic pressure readings are included as required and additional duplex testing as needed. Limited examinations for reoccurring indications may be performed as noted.  ABI Findings: +--------+------------------+-----+--------+--------+ Right   Rt Pressure (mmHg)IndexWaveformComment  +--------+------------------+-----+--------+--------+ [id number]                                     +--------+------------------+-----+--------+--------+ ATA                                    Rt BKA   +--------+------------------+-----+--------+--------+ PTA                                    Rt BKA    +--------+------------------+-----+--------+--------+ +---------+------------------+-----+---------+-------+ Left     Lt Pressure (mmHg)IndexWaveform Comment +---------+------------------+-----+---------+-------+ Brachial 119                                     +---------+------------------+-----+---------+-------+ ATA      120               1.01 triphasic        +---------+------------------+-----+---------+-------+ PTA      129               1.08 triphasic        +---------+------------------+-----+---------+-------+ Great Toe126               1.06                  +---------+------------------+-----+---------+-------+ +-------+-----------+-----------+------------+------------+ ABI/TBIToday's ABIToday's TBIPrevious ABIPrevious TBI +-------+-----------+-----------+------------+------------+ Right  Rt  BKA                Rt BKA                   +-------+-----------+-----------+------------+------------+ Left   1.08       1.06       1.07        .93          +-------+-----------+-----------+------------+------------+  Left ABIs appear essentially unchanged compared to prior study on 07/03/2021. Left TBIs appear increased compared to prior study on 07/03/2021.  Summary: Right: Rt BKA. Left: Resting left ankle-brachial index is within normal range. The left toe-brachial index is normal. *See table(s) above for measurements and observations.  Electronically signed by Festus Barren MD on 07/05/2022 at 7:27:33 AM.    Final        Assessment & Plan:   1. Arthralgia of right lower leg Noninvasive studies show no evidence of DVT.  We will obtain x-rays of the patient's lower extremity in order to determine if there is any abnormalities.  If not we will likely have the patient go for further testing in order to evaluate.  I suspect that this is more so musculoskeletal or nerve related. - DG Knee Complete 4 Views Right; Future   Current Outpatient Medications on File Prior  to Visit  Medication Sig Dispense Refill   apixaban (ELIQUIS) 5 MG TABS tablet Take 1 tablet (5 mg total) by mouth 2 (two) times daily. 60 tablet 4   aspirin EC 81 MG EC tablet Take 1 tablet (81 mg total) by mouth daily. Swallow whole. 90 tablet 3   atorvastatin (LIPITOR) 40 MG tablet Take 1 tablet by mouth once daily 90 tablet 0   citalopram (CELEXA) 40 MG tablet Take 1 tablet (40 mg total) by mouth once daily. 30 tablet 2   citalopram (CELEXA) 40 MG tablet Take 1 tablet (40 mg total) by mouth daily. 30 tablet 5   EPINEPHrine 0.3 mg/0.3 mL IJ SOAJ injection Inject 0.3 mg into the muscle as needed for anaphylaxis. Follow package instructions as needed for severe allergy or anaphylactic reaction. 2 each 3   ferrous gluconate (FERGON) 324 MG tablet Take 1 tablet (324 mg total) by mouth 2 (two) times daily with a meal. 60 tablet 3   Ferrous Gluconate (IRON) 240 (27 Fe) MG TABS Take 1 tablet by mouth daily.     folic acid (FOLVITE) 400 MCG tablet Take 400 mcg by mouth daily.     gabapentin (NEURONTIN) 100 MG capsule SMARTSIG:3 By Mouth Every 8 Hours PRN     gabapentin (NEURONTIN) 300 MG capsule Take 300 mg by mouth 3 (three) times daily.     IRON 100/C 100-250 MG TABS      methotrexate (RHEUMATREX) 10 MG tablet Take 60 mg by mouth once a week. Caution: Chemotherapy. Protect from light.     nicotine (NICODERM CQ - DOSED IN MG/24 HOURS) 21 mg/24hr patch Place 1 patch (21 mg total) onto the skin daily. 28 patch 0   polyethylene glycol powder (GLYCOLAX/MIRALAX) 17 GM/SCOOP powder Take 17 g by mouth 2 (two) times daily as needed for mild constipation. 255 g 0   predniSONE (DELTASONE) 1 MG tablet Take 1 mg by mouth daily with breakfast.     diphenhydrAMINE (BENADRYL) 25 MG tablet Take 1 tablet (25 mg total) by mouth every 6 (six) hours for 3 days. 12 tablet 0   HYDROcodone-acetaminophen (NORCO/VICODIN) 5-325 MG tablet Take 1-2 tablets by  mouth every 6 (six) hours as needed for severe pain or moderate pain.  (Patient not taking: Reported on 03/16/2022) 30 tablet 0   [DISCONTINUED] albuterol (PROVENTIL HFA;VENTOLIN HFA) 108 (90 Base) MCG/ACT inhaler Inhale 2 puffs into the lungs every 6 (six) hours as needed. 1 Inhaler 0   No current facility-administered medications on file prior to visit.    There are no Patient Instructions on file for this visit. No follow-ups on file.   Georgiana Spinner, NP

## 2022-08-03 NOTE — Progress Notes (Unsigned)
Celso Amy, PA-C [address]  [address]  [place], [place] [address]  Main: [phone]  Fax: [phone]   Gastroenterology Consultation  Referring Provider:     Lorn Junes, FNP Primary Care Physician:  [facility] Primary Gastroenterologist:  Celso Amy, PA-C / Dr. Wyline Mood  Reason for Consultation:     Constipation        HPI:   Alexandra [name] is a 52 y.o. y/o female referred for consultation & management  by [facility] [facility].    Patient is here to evaluate constipation.  Has history of chronic pain and has been on chronic pain medication including hydrocodone in the past year.  Not currently taking opiod pain medicine.  Patient has had increased severe constipation for 1 - 2 months.  Currently taking MiraLAX daily with little benefit.  She has tried Ex-Lax which creates a small bowel movement.  She saw her PCP who prescribed Amitiza 24 mcg twice daily for the past 2 weeks which is not helping.  Has not had a bowel movement in over 2 weeks.  She denies rectal bleeding.  Stools have been black.  She is not taking any Pepto-Bismol.  She is taking iron tablet and folic acid once daily.  She has epigastric pain and nausea after eating.  Early satiety.  Black stools.  She denies vomiting or unintentional weight loss.  No known history of colon or GI cancers.  She went to ED 06/12/2022 for generalized abdominal pain.  Abdominal pelvic CT with contrast showed moderate stool throughout the colon consistent with constipation.  No other acute abnormality.  Labs 06/12/2022 showed normal CBC (hemoglobin 13.6), normal CMP, normal lipase.  Negative troponin.  History of peripheral vascular disease requiring stent and right below the knee amputation in 2022.  Currently on Eliquis and aspirin.  Has rheumatoid arthritis and takes methotrexate.  History of Wegener's granulomatosis and pulmonary fibrosis.   Past Medical History:  Diagnosis Date    Breast discharge 2013   present X 3 years  Left only multiple ducts per pt   Depression    Rheumatoid arthritis (HCC)    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)     Past Surgical History:  Procedure Laterality Date   ABDOMINAL HYSTERECTOMY     AMPUTATION Right 09/12/2020   Procedure: AMPUTATION BELOW KNEE;  Surgeon: Annice Needy, MD;  Location: [facility] ORS;  Service: Vascular;  Laterality: Right;   BREAST CYST EXCISION Left 2006   ENDOVASCULAR STENT INSERTION N/A 09/08/2020   Procedure: ENDOVASCULAR STENT GRAFT INSERTION;  Surgeon: Annice Needy, MD;  Location: [facility];  Service: Vascular;  Laterality: N/A;   LOWER EXTREMITY ANGIOGRAPHY Right 09/01/2020   Procedure: Lower Extremity Angiography;  Surgeon: Annice Needy, MD;  Location: [facility];  Service: Cardiovascular;  Laterality: Right;   LOWER EXTREMITY ANGIOGRAPHY Left 10/01/2020   Procedure: LOWER EXTREMITY ANGIOGRAPHY;  Surgeon: Annice Needy, MD;  Location: [facility];  Service: Cardiovascular;  Laterality: Left;   LOWER EXTREMITY INTERVENTION Right 09/07/2020   Procedure: LOWER EXTREMITY INTERVENTION;  Surgeon: Nada Libman, MD;  Location: [facility];  Service: Cardiovascular;  Laterality: Right;    Prior to Admission medications   Medication Sig Start Date End Date Taking? Authorizing Provider  apixaban (ELIQUIS) 5 MG TABS tablet Take 1 tablet (5 mg total) by mouth 2 (two) times daily. 09/29/20   Georgiana Spinner, NP  aspirin EC  81 MG EC tablet Take 1 tablet (81 mg total) by mouth daily. Swallow whole. 10/02/20   Stegmayer, Ranae Plumber, PA-C  atorvastatin (LIPITOR) 40 MG tablet Take 1 tablet by mouth once daily 04/08/22   Georgiana Spinner, NP  citalopram (CELEXA) 40 MG tablet Take 1 tablet (40 mg total) by mouth once daily. 09/17/20   [name], MD  citalopram (CELEXA) 40 MG tablet Take 1 tablet (40 mg total) by mouth daily. 03/16/22   Annice Needy, MD  diphenhydrAMINE (BENADRYL) 25 MG tablet  Take 1 tablet (25 mg total) by mouth every 6 (six) hours for 3 days. 03/19/22 03/22/22  Sharman Cheek, MD  EPINEPHrine 0.3 mg/0.3 mL IJ SOAJ injection Inject 0.3 mg into the muscle as needed for anaphylaxis. Follow package instructions as needed for severe allergy or anaphylactic reaction. 03/19/22   Sharman Cheek, MD  ferrous gluconate (FERGON) 324 MG tablet Take 1 tablet (324 mg total) by mouth 2 (two) times daily with a meal. 09/16/20   [name], MD  Ferrous Gluconate (IRON) 240 (27 Fe) MG TABS Take 1 tablet by mouth daily.    [provider]  folic acid (FOLVITE) 400 MCG tablet Take 400 mcg by mouth daily.    [provider]  gabapentin (NEURONTIN) 100 MG capsule SMARTSIG:3 By Mouth Every 8 Hours PRN 09/19/20   [provider]  gabapentin (NEURONTIN) 300 MG capsule Take 300 mg by mouth 3 (three) times daily. 09/24/20   [provider]         IRON 100/C 100-250 MG TABS  09/17/20   [provider]  methotrexate (RHEUMATREX) 10 MG tablet Take 60 mg by mouth once a week. Caution: Chemotherapy. Protect from light.    [provider]  nicotine (NICODERM CQ - DOSED IN MG/24 HOURS) 21 mg/24hr patch Place 1 patch (21 mg total) onto the skin daily. 09/16/20   [name], MD  polyethylene glycol powder (GLYCOLAX/MIRALAX) 17 GM/SCOOP powder Take 17 g by mouth 2 (two) times daily as needed for mild constipation. 06/12/22   Minna Antis, MD  predniSONE (DELTASONE) 1 MG tablet Take 1 mg by mouth daily with breakfast.    [provider]  albuterol (PROVENTIL HFA;VENTOLIN HFA) 108 (90 Base) MCG/ACT inhaler Inhale 2 puffs into the lungs every 6 (six) hours as needed. 03/26/17 12/20/19  Rebecka Apley, MD    Family History  Problem Relation Age of Onset   Breast cancer Mother 66   Breast cancer Maternal Aunt    Breast cancer Maternal Grandmother    Breast cancer Maternal Aunt        Maternal Great Aunt    Breast cancer Cousin 38      Social History   Tobacco Use   Smoking status: Former    Packs/day: 1.00    Years: 29.00    Additional pack years: 0.00    Total pack years: 29.00    Types: Cigarettes   Smokeless tobacco: Never  Vaping Use   Vaping Use: Never used  Substance Use Topics   Alcohol use: Not Currently    Comment: social   Drug use: No    Allergies as of 08/04/2022   (No Known Allergies)    Review of Systems:    All systems reviewed and negative except where noted in HPI.   Physical Exam:  BP 109/73   Pulse 88   Temp 98.8 F (37.1 C) (Oral)   Ht 5\' 2"  (1.575 m)   Wt  150 lb 3.2 oz (68.1 kg)   LMP 01/14/2009   BMI 27.47 kg/m  Patient's last menstrual period was 01/14/2009. Psych:  Alert and cooperative. Normal mood and affect. General:   Alert,  Well-developed, well-nourished, pleasant and cooperative in NAD Head:  Normocephalic and atraumatic. Eyes:  Sclera clear, no icterus.   Conjunctiva pink. Neck:  Supple; no masses or thyromegaly. Lungs:  Respirations even and unlabored.  Clear throughout to auscultation.   No wheezes, crackles, or rhonchi. No acute distress. Heart:  Regular rate and rhythm; no murmurs, clicks, rubs, or gallops. Abdomen:  Normal bowel sounds.  No bruits.  Soft, and non-distended without masses, hepatosplenomegaly or hernias noted.  No Tenderness.  No guarding or rebound tenderness.    Neurologic:  Alert and oriented x3;  grossly normal neurologically. Psych:  Alert and cooperative. Normal mood and affect.  Imaging Studies: DG Tibia/Fibula Right  Result Date: 08/01/2022 CLINICAL DATA:  Pain. EXAM: RIGHT TIBIA AND FIBULA - 2 VIEW COMPARISON:  None Available. FINDINGS: Osseous structures are osteopenic. No acute fracture, dislocation or subluxation. No knee joint effusion. Status post BKA. IMPRESSION: Osteopenia.  No acute osseous abnormalities. Electronically Signed   By: Layla Maw M.D.   On: 08/01/2022 20:10   DG Knee Complete 4 Views Right  Result  Date: 08/01/2022 CLINICAL DATA:  Pain above knee. EXAM: RIGHT KNEE - COMPLETE 4+ VIEW COMPARISON:  None Available. FINDINGS: The patient is status post a below the knee amputation. The bones and soft tissues appears expected. No evidence of osteomyelitis. No fracture. No other abnormalities. IMPRESSION: Status post below the knee amputation. No cause for pain identified. Electronically Signed   By: Gerome Sam III M.D.   On: 08/01/2022 16:21   VAS [place] LOWER EXTREMITY VENOUS (DVT)  Result Date: 07/30/2022  Lower Venous DVT Study Patient Name:  Alexandra [name]  Date of Exam:   07/28/2022 Medical Rec #: [id number]        Accession #:    [id number] Date of Birth: 1970-11-02        Patient Gender: F Patient Age:   29 years Exam Location:  [facility] Procedure:      VAS [place] LOWER EXTREMITY VENOUS (DVT) Referring Phys: Sheppard Plumber --------------------------------------------------------------------------------  Indications: Pain, Swelling, and Edema. Other Indications: Right posterior thigh pain when walking. Risk Factors: Surgery Right below knee amputation. Bilateral common iliac artery stents. Performing Technologist: Hardie Lora RVT  Examination Guidelines: A complete evaluation includes B-mode imaging, spectral Doppler, color Doppler, and power Doppler as needed of all accessible portions of each vessel. Bilateral testing is considered an integral part of a complete examination. Limited examinations for reoccurring indications may be performed as noted. The reflux portion of the exam is performed with the patient in reverse Trendelenburg.  +---------+---------------+---------+-----------+----------+--------------+ RIGHT    CompressibilityPhasicitySpontaneityPropertiesThrombus Aging +---------+---------------+---------+-----------+----------+--------------+ CFV      Full           Yes      Yes                                  +---------+---------------+---------+-----------+----------+--------------+ SFJ      Full           Yes      Yes                                 +---------+---------------+---------+-----------+----------+--------------+  FV Prox  Full           Yes      Yes                                 +---------+---------------+---------+-----------+----------+--------------+ FV Mid   Full           Yes      Yes                                 +---------+---------------+---------+-----------+----------+--------------+ FV DistalFull           Yes      Yes                                 +---------+---------------+---------+-----------+----------+--------------+ PFV      Full           Yes      Yes                                 +---------+---------------+---------+-----------+----------+--------------+ POP      Full           Yes      Yes                                 +---------+---------------+---------+-----------+----------+--------------+ GSV      Full                                                        +---------+---------------+---------+-----------+----------+--------------+ Below knee amputation  +----+---------------+---------+-----------+----------+--------------+ LEFTCompressibilityPhasicitySpontaneityPropertiesThrombus Aging +----+---------------+---------+-----------+----------+--------------+ CFV Full           Yes      Yes                                 +----+---------------+---------+-----------+----------+--------------+     Summary: RIGHT: - There is no evidence of deep vein thrombosis in the lower extremity. - There is no evidence of superficial venous thrombosis.  - No cystic structure found in the popliteal fossa. - Nothing unusual seen at the posterior thigh symptomatic area.  LEFT: - No evidence of common femoral vein obstruction.  *See table(s) above for measurements and observations. Electronically signed by Levora Dredge MD on  07/30/2022 at 8:33:31 AM.    Final     Assessment and Plan:   Alexandra [name] is a 52 y.o. y/o female has been referred for   Constipation - Severe  Abdominal x-ray 1 view - evaluate stool burden  GoLytely colon prep purge.  Use OTC fleets enemas 2 daily as needed.  Day after patient has completed GoLytely, then she can start Linzess -145 and 290 mcg samples given.  She will let me know which dose works best.  Instructions given.  Abdominal Pain - Generalized  Likely due to severe constipation.  Scheduling abdominal x-ray.  Abdominal pelvic CT and labs 06/12/2022 unrevealing except for constipation.  3.   Colon cancer screening  Plan to schedule colonoscopy and  EGD after her constipation improves.  4.   Epigastric pain and nausea  Treat constipation.  Close follow-up in 2 weeks.  Plan to schedule EGD and colonoscopy after constipation improves.  Follow up in 2 weeks to follow-up with constipation with TG.  Then we can schedule colonoscopy, once her constipation has improved.  Celso Amy, PA-C

## 2022-08-04 ENCOUNTER — Ambulatory Visit
Admission: RE | Admit: 2022-08-04 | Discharge: 2022-08-04 | Disposition: A | Discharge status: Home / Self Care | Payer: Medicaid Other | Source: Ambulatory Visit | Attending: Physician Assistant | Admitting: Physician Assistant

## 2022-08-04 ENCOUNTER — Other Ambulatory Visit: Payer: Self-pay | Admitting: Family Medicine

## 2022-08-04 ENCOUNTER — Encounter: Payer: Self-pay | Admitting: Physician Assistant

## 2022-08-04 ENCOUNTER — Ambulatory Visit (INDEPENDENT_AMBULATORY_CARE_PROVIDER_SITE_OTHER): Payer: Medicaid Other | Admitting: Physician Assistant

## 2022-08-04 VITALS — BP 109/73 | HR 88 | Temp 98.8°F | Ht 62.0 in | Wt 150.2 lb

## 2022-08-04 DIAGNOSIS — R1084 Generalized abdominal pain: Secondary | ICD-10-CM | POA: Insufficient documentation

## 2022-08-04 DIAGNOSIS — K5901 Slow transit constipation: Secondary | ICD-10-CM

## 2022-08-04 DIAGNOSIS — R1013 Epigastric pain: Secondary | ICD-10-CM

## 2022-08-04 DIAGNOSIS — Z1211 Encounter for screening for malignant neoplasm of colon: Secondary | ICD-10-CM

## 2022-08-04 DIAGNOSIS — R110 Nausea: Secondary | ICD-10-CM | POA: Diagnosis not present

## 2022-08-04 DIAGNOSIS — N644 Mastodynia: Secondary | ICD-10-CM

## 2022-08-04 MED ORDER — PEG 3350-KCL-NA BICARB-NACL 420 G PO SOLR: ORAL | 0 refills | Status: DC

## 2022-08-04 NOTE — Patient Instructions (Signed)

## 2022-08-06 ENCOUNTER — Telehealth: Payer: Self-pay

## 2022-08-06 NOTE — Telephone Encounter (Signed)
She stated she did the Golytely but not the fleets enema. She will start the Linzess today . She will call if any problems occur.   Notify pt.: Abdominal x-ray shows constipation.  No evidence of bowel obstruction.  Continue with current plan.  She was supposed to take GoLytely colon prep and fleets enemas.  After that she was given samples of Linzess to start taking.  How is her constipation?

## 2022-08-06 NOTE — Progress Notes (Signed)
Notify pt.: Abdominal x-ray shows constipation.  No evidence of bowel obstruction.  Continue with current plan.  She was supposed to take GoLytely colon prep and fleets enemas.  After that she was given samples of Linzess to start taking.  How is her constipation?

## 2022-08-17 ENCOUNTER — Ambulatory Visit
Admission: RE | Admit: 2022-08-17 | Discharge: 2022-08-17 | Disposition: A | Discharge status: Home / Self Care | Payer: Medicaid Other | Source: Ambulatory Visit | Attending: Family Medicine | Admitting: Family Medicine

## 2022-08-17 ENCOUNTER — Other Ambulatory Visit: Payer: Self-pay

## 2022-08-17 DIAGNOSIS — N644 Mastodynia: Secondary | ICD-10-CM | POA: Diagnosis present

## 2022-08-18 NOTE — Progress Notes (Deleted)
Celso Amy, PA-C [address]  [address]  [place], [place] [address]  Main: [phone]  Fax: [phone]   Primary Care Physician: [facility]  Primary Gastroenterologist:  Celso Amy, PA-C / Dr. Wyline Mood   CC: F/U Severe Constipation  HPI: Alexandra [name] is a 52 y.o. female whom I saw 08/04/2022 returns for 2-week follow-up of severe constipation.  Abdominal x-ray 08/05/2022 showed constipation but no obstruction.  She was given GoLytely colon prep purge and fleets enemas.  After that was started on Linzess daily.  We were planning to schedule colonoscopy once her constipation improved.  Previously failed Amitiza, MiraLAX, and Ex-Lax.  She has also had some epigastric pain and nausea.  We have discussed scheduling an EGD for further evaluation.  No previous EGD or colonoscopy?  She went to ED 06/12/2022 for generalized abdominal pain.  Abdominal pelvic CT with contrast showed moderate stool throughout the colon consistent with constipation.  No other acute abnormality.   Labs 06/12/2022 showed normal CBC (hemoglobin 13.6), normal CMP, normal lipase.  Negative troponin.   History of peripheral vascular disease requiring stent and right below the knee amputation in 2022.  Currently on Eliquis and aspirin.  Has rheumatoid arthritis and takes methotrexate.  History of Wegener's granulomatosis and pulmonary fibrosis.   Current Outpatient Medications  Medication Sig Dispense Refill   apixaban (ELIQUIS) 5 MG TABS tablet Take 1 tablet (5 mg total) by mouth 2 (two) times daily. 60 tablet 4   aspirin EC 81 MG EC tablet Take 1 tablet (81 mg total) by mouth daily. Swallow whole. 90 tablet 3   atorvastatin (LIPITOR) 40 MG tablet Take 1 tablet by mouth once daily 90 tablet 0   calcium carbonate (OSCAL) 1500 (600 Ca) MG TABS tablet Take 600 mg of elemental calcium by mouth 2 (two) times daily with a meal.     citalopram (CELEXA) 40 MG tablet Take 1 tablet (40 mg  total) by mouth daily. 30 tablet 5   diphenhydrAMINE (BENADRYL) 25 MG tablet Take 1 tablet (25 mg total) by mouth every 6 (six) hours for 3 days. (Patient not taking: Reported on 08/04/2022) 12 tablet 0   EPINEPHrine 0.3 mg/0.3 mL IJ SOAJ injection Inject 0.3 mg into the muscle as needed for anaphylaxis. Follow package instructions as needed for severe allergy or anaphylactic reaction. 2 each 3   ferrous gluconate (FERGON) 324 MG tablet Take 1 tablet (324 mg total) by mouth 2 (two) times daily with a meal. 60 tablet 3   folic acid (FOLVITE) 400 MCG tablet Take 400 mcg by mouth daily.     gabapentin (NEURONTIN) 300 MG capsule Take 300 mg by mouth 3 (three) times daily.     lubiprostone (AMITIZA) 24 MCG capsule 24 mcg.     methotrexate (RHEUMATREX) 10 MG tablet Take 60 mg by mouth once a week. Caution: Chemotherapy. Protect from light.     nicotine (NICODERM CQ - DOSED IN MG/24 HOURS) 21 mg/24hr patch Place 1 patch (21 mg total) onto the skin daily. 28 patch 0   pantoprazole (PROTONIX) 40 MG tablet Take 40 mg by mouth daily.     Pirfenidone 267 MG CAPS Take 1 capsule by mouth in the morning, at noon, and at bedtime.     polyethylene glycol powder (GLYCOLAX/MIRALAX) 17 GM/SCOOP powder Take 17 g by mouth 2 (two) times daily as needed for mild constipation. (Patient not taking: Reported on 08/04/2022) 255 g 0   polyethylene glycol-electrolytes (  NULYTELY) 420 g solution Drink one 8 oz glass of mixture every 15 minutes until you finish the jug. 4000 mL 0   predniSONE (DELTASONE) 2.5 MG tablet Take 2.5 mg by mouth daily.     No current facility-administered medications for this visit.    Allergies as of 08/19/2022   (No Known Allergies)    Past Medical History:  Diagnosis Date   Breast discharge 2013   present X 3 years  Left only multiple ducts per pt   Depression    Rheumatoid arthritis (HCC)    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)     Past Surgical History:  Procedure  Laterality Date   ABDOMINAL HYSTERECTOMY     AMPUTATION Right 09/12/2020   Procedure: AMPUTATION BELOW KNEE;  Surgeon: Annice Needy, MD;  Location: [facility];  Service: Vascular;  Laterality: Right;   BREAST CYST EXCISION Left 2006   ENDOVASCULAR STENT INSERTION N/A 09/08/2020   Procedure: ENDOVASCULAR STENT GRAFT INSERTION;  Surgeon: Annice Needy, MD;  Location: [facility];  Service: Vascular;  Laterality: N/A;   LOWER EXTREMITY ANGIOGRAPHY Right 09/01/2020   Procedure: Lower Extremity Angiography;  Surgeon: Annice Needy, MD;  Location: [facility];  Service: Cardiovascular;  Laterality: Right;   LOWER EXTREMITY ANGIOGRAPHY Left 10/01/2020   Procedure: LOWER EXTREMITY ANGIOGRAPHY;  Surgeon: Annice Needy, MD;  Location: [facility];  Service: Cardiovascular;  Laterality: Left;   LOWER EXTREMITY INTERVENTION Right 09/07/2020   Procedure: LOWER EXTREMITY INTERVENTION;  Surgeon: Nada Libman, MD;  Location: [facility];  Service: Cardiovascular;  Laterality: Right;    Review of Systems:    All systems reviewed and negative except where noted in HPI.   Physical Examination:   LMP 01/14/2009   General: Well-nourished, well-developed in no acute distress.  Eyes: No icterus. Conjunctivae pink. Mouth: Oropharyngeal mucosa moist and pink , no lesions erythema or exudate. Lungs: Clear to auscultation bilaterally. Non-labored. Heart: Regular rate and rhythm, no murmurs rubs or gallops.  Abdomen: Bowel sounds are normal; Abdomen is Soft; No hepatosplenomegaly, masses or hernias;  No Abdominal Tenderness; No guarding or rebound tenderness. Extremities: No lower extremity edema. No clubbing or deformities. Neuro: Alert and oriented x 3.  Grossly intact. Skin: Warm and dry, no jaundice.   Psych: Alert and cooperative, normal mood and affect.   Imaging Studies:   Assessment and Plan:   Alexandra [name] is a 52 y.o. y/o female returns for 2-week follow-up of severe  constipation treated with GoLytely, fleets enemas, and started on Linzess.  Currently improved.  She is ready to proceed with scheduling a colonoscopy.  She has also had some epigastric pain and nausea and we are also scheduling EGD.  Constipation  Continue Linzess  Generalized abdominal pain   Epigastric pain and nausea  Scheduling EGD I discussed risks of EGD with patient to include risk of bleeding, perforation, and risk of sedation.   Patient expressed understanding and agrees to proceed with EGD.   Colon cancer screening  Scheduling Colonoscopy I discussed risks of colonoscopy with patient to include risk of bleeding, colon perforation, and risk of sedation.  Patient expressed understanding and agrees to proceed with colonoscopy.   5.   Peripheral vascular disease, currently on Eliquis and aspirin  Requesting permission to hold Eliquis prior to EGD and colonoscopy    Celso Amy, PA-C  Follow up in ***  BP check ***

## 2022-08-19 ENCOUNTER — Encounter: Payer: Self-pay | Admitting: Physician Assistant

## 2022-08-19 ENCOUNTER — Ambulatory Visit: Payer: Medicaid Other | Admitting: Physician Assistant

## 2022-09-07 ENCOUNTER — Other Ambulatory Visit: Payer: Self-pay

## 2022-09-07 ENCOUNTER — Ambulatory Visit: Payer: Medicaid Other | Attending: Cardiovascular Disease | Admitting: Cardiovascular Disease

## 2022-09-07 ENCOUNTER — Encounter: Payer: Self-pay | Admitting: Cardiovascular Disease

## 2022-09-07 VITALS — BP 100/60 | HR 80 | Ht 62.0 in | Wt 149.2 lb

## 2022-09-07 DIAGNOSIS — F17200 Nicotine dependence, unspecified, uncomplicated: Secondary | ICD-10-CM

## 2022-09-07 DIAGNOSIS — R55 Syncope and collapse: Secondary | ICD-10-CM

## 2022-09-07 DIAGNOSIS — I7410 Embolism and thrombosis of unspecified parts of aorta: Secondary | ICD-10-CM | POA: Diagnosis not present

## 2022-09-07 DIAGNOSIS — J8410 Pulmonary fibrosis, unspecified: Secondary | ICD-10-CM

## 2022-09-07 DIAGNOSIS — I776 Arteritis, unspecified: Secondary | ICD-10-CM

## 2022-09-07 DIAGNOSIS — I70229 Atherosclerosis of native arteries of extremities with rest pain, unspecified extremity: Secondary | ICD-10-CM

## 2022-09-07 DIAGNOSIS — R0602 Shortness of breath: Secondary | ICD-10-CM

## 2022-09-07 DIAGNOSIS — I739 Peripheral vascular disease, unspecified: Secondary | ICD-10-CM

## 2022-09-07 NOTE — Progress Notes (Signed)
Cardiology Office Note  Date:  09/07/2022   ID:  Ewa, Hipp [birth year], MRN [id number]  PCP:  [facility]   Chief Complaint  Patient presents with   New Patient (Initial Visit)    Ref by Tracey Harries, MD for Lightheadedness/Imbalance. Patient c/o dizziness & pre-syncope.  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   on methotrexate 2.5 mg/day with 15 mg weekly regimen.  been on rituximab and high-dose steroids in August 2022.  on 2.5 mg of prednisone daily.  C-reactive protein was performed in August 2023 with essentially normal   05/03/22- non-massive hemoptysis   brother had pulmonary fibrosis and died 4 years after being diagnosed with it. Patients grandfather passed away at late 83s.   Hx of  aortic thrombus , arterial lower extermity thrombosis. Patient smokes daily   Echo performed May 05, 2021 with no pulmonary valve regurgitation, absence of right-sided dysfunction and only trace tricuspid valve insufficiency.   Evaluated  by neurology Aug 13, 2022 On discussion of lightheadedness, imbalance, carotid ultrasound ordered, PT and vestibular rehab ordered  Lab work reviewed A1c 6.2  Periodic low blood pressure noted on today's visit, also with vascular on recent office visits 98/64  1 -2 months of lightheadedness Could be iny position, could pass out Can't hold head up, Sits rest of the day to recover, "feels like bobble head", could black out  EKG personally reviewed by myself on todays visit Normal sinus rhythm rate 80 bpm no significant ST-T wave changes  PMH:   has a past medical history of Breast discharge (2013), Depression, Rheumatoid arthritis (HCC), and 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  PSH:    Past Surgical History:  Procedure Laterality Date   ABDOMINAL HYSTERECTOMY     AMPUTATION Right 09/12/2020   Procedure: AMPUTATION BELOW KNEE;  Surgeon: Annice Needy, MD;  Location: [facility] ORS;  Service: Vascular;  Laterality: Right;   BREAST CYST EXCISION Left 2006   ENDOVASCULAR STENT INSERTION N/A 09/08/2020   Procedure: ENDOVASCULAR STENT GRAFT INSERTION;  Surgeon: Annice Needy, MD;  Location: [facility] ORS;  Service: Vascular;  Laterality: N/A;   LOWER EXTREMITY ANGIOGRAPHY Right 09/01/2020   Procedure: Lower Extremity Angiography;  Surgeon: Annice Needy, MD;  Location: [facility] INVASIVE CV LAB;  Service: Cardiovascular;  Laterality: Right;   LOWER EXTREMITY ANGIOGRAPHY Left 10/01/2020   Procedure: LOWER EXTREMITY ANGIOGRAPHY;  Surgeon: Annice Needy, MD;  Location: [facility] INVASIVE CV LAB;  Service: Cardiovascular;  Laterality: Left;   LOWER EXTREMITY INTERVENTION Right 09/07/2020   Procedure: LOWER EXTREMITY INTERVENTION;  Surgeon: Nada Libman, MD;  Location: [facility] INVASIVE CV LAB;  Service: Cardiovascular;  Laterality: Right;    Current  Outpatient Medications  Medication Sig Dispense Refill   apixaban (ELIQUIS) 5 MG TABS tablet Take 1 tablet (5 mg total) by mouth 2 (two) times daily. 60  tablet 4   aspirin EC 81 MG EC tablet Take 1 tablet (81 mg total) by mouth daily. Swallow whole. 90 tablet 3   atorvastatin (LIPITOR) 40 MG tablet Take 1 tablet by mouth once daily 90 tablet 0   calcium carbonate (OSCAL) 1500 (600 Ca) MG TABS tablet Take 600 mg of elemental calcium by mouth 2 (two) times daily with a meal.     citalopram (CELEXA) 40 MG tablet Take 1 tablet (40 mg total) by mouth daily. 30 tablet 5   ferrous gluconate (FERGON) 324 MG tablet Take 1 tablet (324 mg total) by mouth 2 (two) times daily with a meal. 60 tablet 3   folic acid (FOLVITE) 400 MCG tablet Take 400 mcg by mouth daily.     gabapentin (NEURONTIN) 300 MG capsule Take 300 mg by mouth 3 (three) times daily.     lubiprostone (AMITIZA) 24 MCG capsule 24 mcg.     Magnesium Citrate 100 MG CAPS Take by mouth.     methotrexate (RHEUMATREX) 10 MG tablet Take 60 mg by mouth once a week. Caution: Chemotherapy. Protect from light.     pantoprazole (PROTONIX) 40 MG tablet Take 40 mg by mouth daily.     Pirfenidone 267 MG CAPS Take 1 capsule by mouth in the morning, at noon, and at bedtime.     predniSONE (DELTASONE) 2.5 MG tablet Take 2.5 mg by mouth daily.     diphenhydrAMINE (BENADRYL) 25 MG tablet Take 1 tablet (25 mg total) by mouth every 6 (six) hours for 3 days. (Patient not taking: Reported on 09/07/2022) 12 tablet 0   EPINEPHrine 0.3 mg/0.3 mL IJ SOAJ injection Inject 0.3 mg into the muscle as needed for anaphylaxis. Follow package instructions as needed for severe allergy or anaphylactic reaction. (Patient not taking: Reported on 09/07/2022) 2 each 3   nicotine (NICODERM CQ - DOSED IN MG/24 HOURS) 21 mg/24hr patch Place 1 patch (21 mg total) onto the skin daily. (Patient not taking: Reported on 09/07/2022) 28 patch 0   polyethylene glycol powder (GLYCOLAX/MIRALAX) 17 GM/SCOOP powder Take 17 g by mouth 2 (two) times daily as needed for mild constipation. (Patient not taking: Reported on 09/07/2022) 255 g 0   polyethylene  glycol-electrolytes (NULYTELY) 420 g solution Drink one 8 oz glass of mixture every 15 minutes until you finish the jug. (Patient not taking: Reported on 09/07/2022) 4000 mL 0   No current facility-administered medications for this visit.    Allergies:   Patient has no known allergies.   Social History:  The patient  reports that she has been smoking cigarettes. She has a 29.00 pack-year smoking history. She has never used smokeless tobacco. She reports that she does not currently use alcohol. She reports that she does not use drugs.   Family History:   family history includes Breast cancer in her maternal aunt, maternal aunt, and maternal grandmother; Breast cancer (age of onset: 71) in her cousin; Breast cancer (age of onset: 46) in her mother.    Review of Systems: Review of Systems  Constitutional: Negative.   HENT: Negative.    Respiratory: Negative.    Cardiovascular: Negative.   Gastrointestinal: Negative.   Musculoskeletal: Negative.   Neurological:  Positive for dizziness.  Psychiatric/Behavioral: Negative.    All other systems reviewed and are negative.   PHYSICAL  EXAM: VS:  BP 100/60 (BP Location: Right Arm, Patient Position: Sitting, Cuff Size: Normal)   Pulse 80   Ht 5\' 2"  (1.575 m)   Wt 149 lb 4 oz (67.7 kg)   LMP 01/14/2009   SpO2 98%   BMI 27.30 kg/m  , BMI Body mass index is 27.3 kg/m. GEN: Well nourished, well developed, in no acute distress HEENT: normal Neck: no JVD, carotid bruits, or masses Cardiac: RRR; no murmurs, rubs, or gallops,no edema  Respiratory:  clear to auscultation bilaterally, normal work of breathing GI: soft, nontender, nondistended, + BS MS: no deformity or atrophy Skin: warm and dry, no rash Neuro:  Strength and sensation are intact Psych: euthymic mood, full affect   Recent Labs: 06/12/2022: ALT 18; BUN 15; Creatinine, Ser 0.63; Hemoglobin 13.6; Platelets 364; Potassium 4.0; Sodium 137    Lipid Panel Lab Results  Component  Value Date   CHOL 183 09/07/2020   HDL 36 (L) 09/07/2020   LDLCALC 118 (H) 09/07/2020   TRIG 143 09/07/2020      Wt Readings from Last 3 Encounters:  09/07/22 149 lb 4 oz (67.7 kg)  08/04/22 150 lb 3.2 oz (68.1 kg)  07/28/22 149 lb 12.8 oz (67.9 kg)      ASSESSMENT AND PLAN:  Problem List Items Addressed This Visit       Cardiology Problems   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HCC) - Primary   Aortic thrombus (HCC)   Vasculitis (HCC)     Other   Pulmonary fibrosis (HCC)   Tobacco use disorder   Lightheadedness Does not seem to be positional, can happen laying down, sitting standing Seems to come out of the blue, some associated near syncope, feels like she has " bobble head". Blood pressure running low Unclear if related to low blood pressure, she will monitor blood pressure numbers at home when she has symptoms Recommend she stay hydrated, liberalize her salt intake She denies any new medications that would have contributed to symptoms, no recent weight loss in fact has had weight gain she reports -Discussed first medications that could be used to raise blood pressure in effort to relieve symptoms, Florinef which she prefers not to take and midodrine which she could take as needed 5 up to 10 mg -She will call us with blood pressure measurements with symptoms and if correlating with low blood pressure, could try midodrine 5 as needed -Certainly possible symptoms are related to vasculitis She is scheduled to see rheumatology next week -Carotid ultrasound ordered  Aortic atherosclerosis Mild aortic atherosclerosis on CT scan on my review Prior stenting for aortic thrombus On Eliquis 5 twice daily, recommended smoking cessation  Wegeners/pulmonary fibrosis Followed by pulmonary They have requested repeat echo to rule out pulmonary hypertension, echo ordered Smoking cessation recommended     Total encounter time more than 60 minutes   Greater than 50% was spent in counseling and coordination of care with the patient    Signed, Dossie Arbour, M.D., Ph.D. [facility] [place], [place] [phone]

## 2022-09-07 NOTE — Patient Instructions (Addendum)
Please monitor blood pressure when lightheaded If low, we might need midodrine as needed for symptoms   Medication Instructions:  No changes  If you need a refill on your cardiac medications before your next appointment, please call your pharmacy.   Lab work: No new labs needed  Testing/Procedures:  Your physician has requested that you have an echocardiogram. Echocardiography is a painless test that uses sound waves to create images of your heart. It provides your doctor with information about the size and shape of your heart and how well your heart's chambers and valves are working.   You may receive an ultrasound enhancing agent through an IV if needed to better visualize your heart during the echo. This procedure takes approximately one hour.  There are no restrictions for this procedure.  This will take place at [address], [place] [address]   Your physician has requested that you have a carotid duplex. This test is an ultrasound of the carotid arteries in your neck. It looks at blood flow through these arteries that supply the brain with blood.   Allow one hour for this exam.  There are no restrictions or special instructions.  This will take place at [address], [place] [address]   Follow-Up: At [facility], you and your health needs are our priority.  As part of our continuing mission to provide you with exceptional heart care, we have created designated Provider Care Teams.  These Care Teams include your primary Cardiologist (physician) and Advanced Practice Providers (APPs -  Physician Assistants and Nurse Practitioners) who all work together to provide you with the care you need, when you need it.  You will need a follow up appointment as needed  Providers on your designated Care Team:   Nicolasa Ducking, NP Eula Listen, PA-C Cadence Fransico Michael, [place]  COVID-19 Vaccine Information can be found at:  [email or web address] For questions related to vaccine distribution or appointments, please email [email or web address] or call [phone].

## 2022-09-08 ENCOUNTER — Telehealth: Payer: Self-pay | Admitting: Physician Assistant

## 2022-09-09 ENCOUNTER — Encounter: Payer: Self-pay | Admitting: Physician Assistant

## 2022-09-09 ENCOUNTER — Telehealth: Payer: Self-pay | Admitting: Cardiovascular Disease

## 2022-09-09 ENCOUNTER — Ambulatory Visit: Payer: Medicaid Other | Admitting: Physician Assistant

## 2022-09-09 VITALS — BP 94/61 | HR 75 | Temp 98.5°F | Resp 14 | Ht 62.0 in | Wt 147.8 lb

## 2022-09-09 DIAGNOSIS — Z23 Encounter for immunization: Secondary | ICD-10-CM | POA: Diagnosis not present

## 2022-09-09 DIAGNOSIS — I959 Hypotension, unspecified: Secondary | ICD-10-CM | POA: Diagnosis not present

## 2022-09-09 DIAGNOSIS — K5909 Other constipation: Secondary | ICD-10-CM

## 2022-09-09 DIAGNOSIS — I70229 Atherosclerosis of native arteries of extremities with rest pain, unspecified extremity: Secondary | ICD-10-CM | POA: Diagnosis not present

## 2022-09-09 DIAGNOSIS — K5904 Chronic idiopathic constipation: Secondary | ICD-10-CM | POA: Insufficient documentation

## 2022-09-09 DIAGNOSIS — F339 Major depressive disorder, recurrent, unspecified: Secondary | ICD-10-CM | POA: Diagnosis not present

## 2022-09-09 DIAGNOSIS — Z720 Tobacco use: Secondary | ICD-10-CM

## 2022-09-09 DIAGNOSIS — M069 Rheumatoid arthritis, unspecified: Secondary | ICD-10-CM | POA: Diagnosis not present

## 2022-09-09 DIAGNOSIS — Z89511 Acquired absence of right leg below knee: Secondary | ICD-10-CM | POA: Insufficient documentation

## 2022-09-09 DIAGNOSIS — R7303 Prediabetes: Secondary | ICD-10-CM | POA: Insufficient documentation

## 2022-09-09 DIAGNOSIS — Z803 Family history of malignant neoplasm of breast: Secondary | ICD-10-CM | POA: Insufficient documentation

## 2022-09-09 MED ORDER — CITALOPRAM HYDROBROMIDE 40 MG PO TABS: 40.0000 mg | ORAL_TABLET | Freq: Every day | ORAL | 3 refills | Status: DC

## 2022-09-09 MED ORDER — GABAPENTIN 300 MG PO CAPS: 300.0000 mg | ORAL_CAPSULE | Freq: Three times a day (TID) | ORAL | 1 refills | Status: DC

## 2022-09-09 MED ORDER — MIDODRINE HCL 5 MG PO TABS: 5.0000 mg | ORAL_TABLET | Freq: Three times a day (TID) | ORAL | 3 refills | Status: DC

## 2022-09-09 NOTE — Telephone Encounter (Signed)
Spoke with patient and reviewed instructions from provider. Advised that I would have someone from scheduling call her to assist with making appointment. She verbalized understanding with no further questions at this time.

## 2022-09-09 NOTE — Assessment & Plan Note (Signed)
Manages with celexa 40 mg Pt feels symptoms are manageable on current dose Admits to family stressors/encouraged outlet to vent

## 2022-09-09 NOTE — Assessment & Plan Note (Signed)
F/b vascular;  H/o aortic thrombus w/ aortoiliac intervention, s/p right bka On eliquis

## 2022-09-09 NOTE — Assessment & Plan Note (Signed)
Reports trial of amitiza unsuccessful Referred to new gi office

## 2022-09-09 NOTE — Assessment & Plan Note (Signed)
Utd with lung cancer screening Current smoker

## 2022-09-09 NOTE — Assessment & Plan Note (Signed)
S/p vasculitis, ischemic leg.  Follows with prosthetist, neuro. Manages phantom limb pain with gabapentin 300 mg tid

## 2022-09-09 NOTE — Addendum Note (Signed)
Addended by: Lubertha Basque on: 09/09/2022 10:59 AM   Modules accepted: Orders

## 2022-09-09 NOTE — Progress Notes (Signed)
I,Vanessa  Vital,acting as a Neurosurgeon for [employer], PA-C.,have documented all relevant documentation on the behalf of [name], PA-C,as directed by  [name], PA-C while in the presence of [name], PA-C.   Established patient visit   Patient: Alexandra [name]   DOB: June 18, 1970   52 y.o. Female  MRN: [id number] Visit Date: 09/09/2022  Today's healthcare provider: [name], PA-C   Chief Complaint  Patient presents with   New Patient (Initial Visit)   Subjective    HPI  Discussed the use of AI scribe software for clinical note transcription with the patient, who gave verbal consent to proceed.  History of Present Illness   The patient, h/o aortic thrombus w/ aortoiliac intervention/atherosclerosis with ischemic right leg s/p right BKA, currently on Eliquis, vasculitis, pulmonary fibrosis, rheumatoid arthritis, headaches, chronic constipation, is seeking a new primary care provider closer to home. The patient's blood pressure has been low, causing episodes of lightheadedness. She reports tracking it at home and needing a f/u with cardio. The patient has been experiencing constipation and is dissatisfied with her current GI doctor. She was taking Amitza samples for constipation, which are not working.      She follows with GI, pulm, vascular, neuro, rheum. She has a Estate agent.  Medications: Outpatient Medications Prior to Visit  Medication Sig   apixaban (ELIQUIS) 5 MG TABS tablet Take 1 tablet (5 mg total) by mouth 2 (two) times daily.   aspirin EC 81 MG EC tablet Take 1 tablet (81 mg total) by mouth daily. Swallow whole.   atorvastatin (LIPITOR) 40 MG tablet Take 1 tablet by mouth once daily   calcium carbonate (OSCAL) 1500 (600 Ca) MG TABS tablet Take 600 mg of elemental calcium by mouth 2 (two) times daily with a meal.   ferrous gluconate (FERGON) 324 MG tablet Take 1 tablet (324 mg total) by mouth 2 (two) times daily with a meal.   folic acid (FOLVITE)  400 MCG tablet Take 400 mcg by mouth daily.   lubiprostone (AMITIZA) 24 MCG capsule 24 mcg.   Magnesium Citrate 100 MG CAPS Take by mouth.   methotrexate (RHEUMATREX) 10 MG tablet Take 60 mg by mouth once a week. Caution: Chemotherapy. Protect from light.   pantoprazole (PROTONIX) 40 MG tablet Take 40 mg by mouth daily.   Pirfenidone 267 MG CAPS Take 1 capsule by mouth in the morning, at noon, and at bedtime.   predniSONE (DELTASONE) 2.5 MG tablet Take 2.5 mg by mouth daily.   [DISCONTINUED] citalopram (CELEXA) 40 MG tablet Take 1 tablet (40 mg total) by mouth daily.   [DISCONTINUED] gabapentin (NEURONTIN) 300 MG capsule Take 300 mg by mouth 3 (three) times daily.   EPINEPHrine 0.3 mg/0.3 mL IJ SOAJ injection Inject 0.3 mg into the muscle as needed for anaphylaxis. Follow package instructions as needed for severe allergy or anaphylactic reaction. (Patient not taking: Reported on 09/07/2022)   nicotine (NICODERM CQ - DOSED IN MG/24 HOURS) 21 mg/24hr patch Place 1 patch (21 mg total) onto the skin daily. (Patient not taking: Reported on 09/07/2022)   [DISCONTINUED] diphenhydrAMINE (BENADRYL) 25 MG tablet Take 1 tablet (25 mg total) by mouth every 6 (six) hours for 3 days. (Patient not taking: Reported on 09/07/2022)   [DISCONTINUED] polyethylene glycol powder (GLYCOLAX/MIRALAX) 17 GM/SCOOP powder Take 17 g by mouth 2 (two) times daily as needed for mild constipation. (Patient not taking: Reported on 09/07/2022)   [DISCONTINUED] polyethylene glycol-electrolytes (NULYTELY) 420 g solution Drink one  8 oz glass of mixture every 15 minutes until you finish the jug. (Patient not taking: Reported on 09/07/2022)   No facility-administered medications prior to visit.    Review of Systems  Neurological:  Positive for light-headedness.  All other systems reviewed and are negative.     Objective    Blood pressure 94/61, pulse 75, temperature 98.5 F (36.9 C), temperature source Oral, resp. rate 14, height 5'  2" (1.575 m), weight 147 lb 12.8 oz (67 kg), last menstrual period 01/14/2009, SpO2 100 %.   Physical Exam Constitutional:      General: She is awake.     Appearance: She is well-developed.  HENT:     Head: Normocephalic.  Eyes:     Conjunctiva/sclera: Conjunctivae normal.  Cardiovascular:     Rate and Rhythm: Normal rate and regular rhythm.     Heart sounds: Normal heart sounds.  Pulmonary:     Effort: Pulmonary effort is normal.     Breath sounds: Normal breath sounds.  Skin:    General: Skin is warm.  Neurological:     Mental Status: She is alert and oriented to person, place, and time.  Psychiatric:        Attention and Perception: Attention normal.        Mood and Affect: Mood normal.        Speech: Speech normal.        Behavior: Behavior is cooperative.      No results found for any visits on 09/09/22.  Assessment & Plan     Problem List Items Addressed This Visit       Cardiovascular and Mediastinum   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HCC)    F/b vascular;  H/o aortic thrombus w/ aortoiliac intervention, s/p right bka On eliquis      Hypotension    Asymptomatic today Reached out to cardio for earlier appt        Digestive   Chronic constipation    Reports trial of amitiza unsuccessful Referred to new gi office      Relevant Orders   Ambulatory referral to Gastroenterology     Musculoskeletal and Integument   Rheumatoid arthritis (HCC)    Follows with rheum      Relevant Medications   gabapentin (NEURONTIN) 300 MG capsule     Other   Tobacco use    Utd with lung cancer screening Current smoker      Recurrent depression (HCC)    Manages with celexa 40 mg Pt feels symptoms are manageable on current dose Admits to family stressors/encouraged outlet to vent      Relevant Medications   citalopram (CELEXA) 40 MG tablet   Family history of breast cancer    Mother dx at 42 Pt is utd on breast cancer  screening      Prediabetes    08/13/22 6.2% Will monitor      Other Visit Diagnoses     Hx of right BKA (HCC)    -  Primary   Relevant Medications   gabapentin (NEURONTIN) 300 MG capsule      Encouraged colonoscopy w/ GI   Pt is s/p hysterectomy w intact ovaries ~ 16-17 years ago. Reports offhand having 'cervical cancer that needed a laser treatment' but normal paps since then. Last pap 2016, no cervical cells, hpv negative. No further screening likely necessary, pt declines regardless.  Return in about 6 months (around 03/11/2023) for chronic conditions.  I, [name], PA-C have reviewed all documentation for this visit. The documentation on  09/09/22   for the exam, diagnosis, procedures, and orders are all accurate and complete.  [name], PA-C [facility] [address] [place], [place], [address] Office: [phone] Fax: [phone]   [facility]

## 2022-09-09 NOTE — Addendum Note (Signed)
Addended by: Sherri Rad C on: 09/09/2022 09:55 AM   Modules accepted: Orders

## 2022-09-09 NOTE — Assessment & Plan Note (Signed)
08/13/22 6.2% Will monitor

## 2022-09-09 NOTE — Assessment & Plan Note (Signed)
Asymptomatic today Reached out to cardio for earlier appt

## 2022-09-09 NOTE — Telephone Encounter (Signed)
   Will give patient call to arrange follow up appointment.

## 2022-09-09 NOTE — Assessment & Plan Note (Signed)
Mother dx at 59 Pt is utd on breast cancer screening

## 2022-09-09 NOTE — Assessment & Plan Note (Signed)
-   Follows with rheum

## 2022-09-13 ENCOUNTER — Encounter: Payer: Self-pay | Admitting: Family Medicine

## 2022-09-13 ENCOUNTER — Ambulatory Visit: Payer: Self-pay

## 2022-09-13 ENCOUNTER — Ambulatory Visit: Payer: Medicaid Other | Admitting: Family Medicine

## 2022-09-13 VITALS — BP 104/70 | HR 83 | Temp 98.4°F | Resp 16 | Ht 62.0 in | Wt 147.8 lb

## 2022-09-13 DIAGNOSIS — L270 Generalized skin eruption due to drugs and medicaments taken internally: Secondary | ICD-10-CM | POA: Diagnosis not present

## 2022-09-13 DIAGNOSIS — T50Z95A Adverse effect of other vaccines and biological substances, initial encounter: Secondary | ICD-10-CM | POA: Diagnosis not present

## 2022-09-13 NOTE — Progress Notes (Signed)
I,Sulibeya S Dimas,acting as a Neurosurgeon for Shirlee Latch, MD.,have documented all relevant documentation on the behalf of Shirlee Latch, MD,as directed by  Shirlee Latch, MD while in the presence of Shirlee Latch, MD.     Established patient visit   Patient: Alexandra [name]   DOB: March 01, 1971   52 y.o. Female  MRN: [id number] Visit Date: 09/13/2022  Today's healthcare provider: Shirlee Latch, MD   Chief Complaint  Patient presents with   Rash   Subjective    HPI  Patient C/O reaction due to immunizations. Patient had Tdap and shingles vaccine on 09/09/22. She reports redness, lump hot to touch, pain and fever.   Discussed the use of AI scribe software for clinical note transcription with the patient, who gave verbal consent to proceed.  History of Present Illness   The patient, with a history of multiple diseases, presents with a 'red hot lump that's tender to touch and painful' in the left arm following a tetanus and shingles vaccination in the same arm one week prior. The discomfort radiates to the elbow and is associated with fevers up to 101.5 degrees Fahrenheit. The patient describes the pain as burning and reports difficulty lying on the affected side due to pain with pressure. Over-the-counter pain medication has not provided relief. The patient is scheduled for a Rituxan infusion in the near future and is concerned about potential interactions or contraindications.       Medications: Outpatient Medications Prior to Visit  Medication Sig   apixaban (ELIQUIS) 5 MG TABS tablet Take 1 tablet (5 mg total) by mouth 2 (two) times daily.   aspirin EC 81 MG EC tablet Take 1 tablet (81 mg total) by mouth daily. Swallow whole.   atorvastatin (LIPITOR) 40 MG tablet Take 1 tablet by mouth once daily   calcium carbonate (OSCAL) 1500 (600 Ca) MG TABS tablet Take 600 mg of elemental calcium by mouth 2 (two) times daily with a meal.   citalopram (CELEXA) 40 MG tablet Take  1 tablet (40 mg total) by mouth daily.   EPINEPHrine 0.3 mg/0.3 mL IJ SOAJ injection Inject 0.3 mg into the muscle as needed for anaphylaxis. Follow package instructions as needed for severe allergy or anaphylactic reaction. (Patient not taking: Reported on 09/07/2022)   ferrous gluconate (FERGON) 324 MG tablet Take 1 tablet (324 mg total) by mouth 2 (two) times daily with a meal.   folic acid (FOLVITE) 400 MCG tablet Take 400 mcg by mouth daily.   gabapentin (NEURONTIN) 300 MG capsule Take 1 capsule (300 mg total) by mouth 3 (three) times daily.   lubiprostone (AMITIZA) 24 MCG capsule 24 mcg.   Magnesium Citrate 100 MG CAPS Take by mouth.   methotrexate (RHEUMATREX) 10 MG tablet Take 60 mg by mouth once a week. Caution: Chemotherapy. Protect from light.   midodrine (PROAMATINE) 5 MG tablet Take 1 tablet (5 mg total) by mouth 3 (three) times daily with meals.   nicotine (NICODERM CQ - DOSED IN MG/24 HOURS) 21 mg/24hr patch Place 1 patch (21 mg total) onto the skin daily. (Patient not taking: Reported on 09/07/2022)   pantoprazole (PROTONIX) 40 MG tablet Take 40 mg by mouth daily.   Pirfenidone 267 MG CAPS Take 1 capsule by mouth in the morning, at noon, and at bedtime.   predniSONE (DELTASONE) 2.5 MG tablet Take 2.5 mg by mouth daily.   No facility-administered medications prior to visit.    Review of Systems per HPI  Objective    BP 104/70 (BP Location: Right Arm, Patient Position: Sitting, Cuff Size: Large)   Pulse 83   Temp 98.4 F (36.9 C) (Temporal)   Resp 16   Ht 5\' 2"  (1.575 m)   Wt 147 lb 12.8 oz (67 kg)   LMP 01/14/2009   SpO2 97%   BMI 27.03 kg/m    Physical Exam    Physical Exam   VITALS: T- 101.5 SKIN: Red, hot, tender, firm lump extending from shoulder to two-thirds of the way to the elbow on the left arm. Painful to touch.       No results found for any visits on 09/13/22.  Assessment & Plan     1. Adverse effect of vaccine, initial encounter     Post-vaccination reaction: Severe local reaction to Shingrix vaccine with pain, erythema, and warmth extending from shoulder to elbow. Discussed the immune response to the vaccine and the potential for a more severe reaction with the second dose. -Apply ice to the affected area. -Continue movement of the arm to prevent stiffness. -Use Tylenol or ibuprofen for pain relief. -Consider risks and benefits before proceeding with the second dose of Shingrix.  Rituximab infusion: Scheduled for upcoming Rituximab infusion. Assessed safety of proceeding with infusion given the post-vaccination reaction. -Proceed with Rituximab infusion as planned.        No follow-ups on file.      I, Shirlee Latch, MD, have reviewed all documentation for this visit. The documentation on 09/13/22 for the exam, diagnosis, procedures, and orders are all accurate and complete.   Ciera Beckum, Marzella Schlein, MD, MPH [facility]

## 2022-09-13 NOTE — Telephone Encounter (Signed)
  Chief Complaint: Redness, lump hot to touch, pain and fever - vaccines on Thursday Symptoms:  Redness, lump hot to touch, pain and fever Frequency: Thursday Pertinent Negatives: Patient denies  Disposition: [] ED /[] Urgent Care (no appt availability in office) / [x] Appointment(In office/virtual)/ []  [place] [place] [facility]/ [] Home Care/ [] Refused Recommended Disposition /[] [place] [facility]/ []  Follow-up with PCP Additional Notes: Pt received 2 vaccines on Thursday, Shingles and tetanus. Pt reports that the injection site is red, hot to the touch, and very painful(throbbing). Pt also reports fever above 101. Pt has weakened immune system.  Reason for Disposition  Fever present > 3 days (72 hours)  Answer Assessment - Initial Assessment Questions 1. SYMPTOMS: "What is the main symptom?" (e.g., redness, swelling, pain)      Red swollen fever and pain hot throbs 2. ONSET: "When was the vaccine (shot) given?" "How much later did the *No Answer* begin?" (e.g., hours, days ago)      Thurs - s/s  3. SEVERITY: "How bad is it?"      Red swollen fever and pain hot throbs 4. FEVER: "Is there a fever?" If Yes, ask: "What is it, how was it measured, and when did it start?"      Over 101 5. IMMUNIZATIONS GIVEN: "What shots have you recently received?"     Tetanus 6. PAST REACTIONS: "Have you reacted to immunizations before?" If Yes, ask: "What happened?"     no 7. OTHER SYMPTOMS: "Do you have any other symptoms?"     Soreness  Protocols used: Immunization Reactions-A-AH

## 2022-09-17 ENCOUNTER — Telehealth: Payer: Self-pay | Admitting: Cardiovascular Disease

## 2022-09-17 NOTE — Progress Notes (Signed)
Order(s) created erroneously. Erroneous order ID: [id number]  Order moved by: Ian Malkin  Order move date/time: 09/17/2022 1:33 PM  Source Patient: [id number]  Source Contact: 09/07/2022  Destination Patient: [id number]  Destination Contact: 08/25/2022

## 2022-09-17 NOTE — Telephone Encounter (Signed)
Patient says that her face is numb and tingley as well as her pointer finger. Wants to know what the cause of it is and to receive a call back

## 2022-09-17 NOTE — Telephone Encounter (Signed)
Called patient, she states that since the time of the previous call around 12:00- she's had face numbness, she does not notice any facial drooping, her smile and eyes are even, does not show signes of stroke- patient states she can place her tongue to the roof of her mouth and it feels numb, she also states her pointer fingers and part of the middle fingers can feel numb at the same time. Patient denies shortness of breath, pain in the arm or neck area. She is currently getting an infusion of Rituxan, but states it is not a reaction to the medication she has received this many times before- the site is fine, no redness noted, the infusion location took her blood pressure and advised it was normal.  She states this is the second time this week that this has happened. Advised I would route to MD to review and see his thoughts.   Upcoming visit with PA on 07/30. Recently seen MD in office 08/2022.

## 2022-09-20 NOTE — Telephone Encounter (Signed)
Called and spoke with patient. Informed her of the following recommendations from Dr. Mariah Milling.    Symptoms detailed do not seem cardiac in nature Could discuss with rheumatology And if symptoms persist could be evaluated in the emergency room Thx TGollan    Patient verbalizes understanding.

## 2022-09-20 NOTE — Telephone Encounter (Signed)
Left message for call back.

## 2022-09-20 NOTE — Telephone Encounter (Signed)
Pt returning nurses phone call. Please advise ?

## 2022-09-21 ENCOUNTER — Other Ambulatory Visit: Payer: Medicaid Other

## 2022-09-21 ENCOUNTER — Ambulatory Visit: Payer: Medicaid Other | Attending: Neurology

## 2022-09-22 ENCOUNTER — Ambulatory Visit: Payer: Self-pay | Admitting: *Deleted

## 2022-09-22 NOTE — Telephone Encounter (Signed)
  Chief Complaint: headache, vomiting, fever Symptoms: patient reports symptoms started with headache and nausea, fever- then she had vomiting- dark/black in color. Patient feels like she has pressure in esophagus  Frequency: 3 days Pertinent Negatives: Patient denies abdominal pain Disposition: [] ED /[] Urgent Care (no appt availability in office) / [x] Appointment(In office/virtual)/ []  [facility]/ [] Home Care/ [] Refused Recommended Disposition /[] [place] [facility]/ []  Follow-up with PCP Additional Notes: Patient has been scheduled with PCP tomorrow- advised home COVID test, if symptoms get worse before appointment - UC/ED

## 2022-09-22 NOTE — Telephone Encounter (Signed)
Reason for Disposition  [1] MODERATE headache (e.g., interferes with normal activities) AND [2] present > 24 hours AND [3] unexplained  (Exceptions: analgesics not tried, typical migraine, or headache part of viral illness)  Answer Assessment - Initial Assessment Questions 1. LOCATION: "Where does it hurt?"      Front of head 2. ONSET: "When did the headache start?" (Minutes, hours or days)      3 days 3. PATTERN: "Does the pain come and go, or has it been constant since it started?"     Comes and goes 4. SEVERITY: "How bad is the pain?" and "What does it keep you from doing?"  (e.g., Scale 1-10; mild, moderate, or severe)   - MILD (1-3): doesn't interfere with normal activities    - MODERATE (4-7): interferes with normal activities or awakens from sleep    - SEVERE (8-10): excruciating pain, unable to do any normal activities        moderate 5. RECURRENT SYMPTOM: "Have you ever had headaches before?" If Yes, ask: "When was the last time?" and "What happened that time?"      Yes- headaches often- but does not make her vomit 6. CAUSE: "What do you think is causing the headache?"     unsure 7. MIGRAINE: "Have you been diagnosed with migraine headaches?" If Yes, ask: "Is this headache similar?"      no 8. HEAD INJURY: "Has there been any recent injury to the head?"      no 9. OTHER SYMPTOMS: "Do you have any other symptoms?" (fever, stiff neck, eye pain, sore throat, cold symptoms)     Vomiting- today- dark, fever- 100  Protocols used: Headache-A-AH

## 2022-09-23 ENCOUNTER — Encounter: Payer: Self-pay | Admitting: Physician Assistant

## 2022-09-23 ENCOUNTER — Ambulatory Visit: Payer: Medicaid Other | Admitting: Physician Assistant

## 2022-09-23 VITALS — BP 117/69 | HR 85 | Temp 98.2°F | Ht 62.0 in | Wt 146.0 lb

## 2022-09-23 DIAGNOSIS — R112 Nausea with vomiting, unspecified: Secondary | ICD-10-CM

## 2022-09-23 DIAGNOSIS — R509 Fever, unspecified: Secondary | ICD-10-CM | POA: Diagnosis not present

## 2022-09-23 LAB — POC COVID19 BINAXNOW: SARS Coronavirus 2 Ag: NEGATIVE

## 2022-09-23 NOTE — Progress Notes (Signed)
Established patient visit   Patient: Alexandra [name]   DOB: [birth year]   52 y.o. Female  MRN: [id number] Visit Date: 09/23/2022  Today's healthcare provider: [name], PA-C   Cc. N/v/fatigue  Subjective    HPI Discussed the use of AI scribe software for clinical note transcription with the patient, who gave verbal consent to proceed.  History of Present Illness   The patient presents with a general feeling of illness for the past week. The patient reports experiencing headaches and nausea, which were the initial symptoms. The patient also reports having a fever of 100 degrees for three days in the evening, which did not subside with Tylenol. The patient has also been experiencing increased bowel movements over the past two days, a change from previous constipation. Denies blood in stool. Reports her stools are always 'black'.  The patient has also been experiencing a feeling of fullness in the esophagus, particularly when lying down, which has been causing discomfort and difficulty sleeping. The patient has not taken any additional medication for this symptom, apart from her regular acid medication. Denies pain.  The patient also reports vomiting once, with the vomit being black in color.   The patient also reports an episode of facial numbness, which occurred twice in the past week. Did not last for long, and has since resolved.       Medications: Outpatient Medications Prior to Visit  Medication Sig   apixaban (ELIQUIS) 5 MG TABS tablet Take 1 tablet (5 mg total) by mouth 2 (two) times daily.   aspirin EC 81 MG EC tablet Take 1 tablet (81 mg total) by mouth daily. Swallow whole.   atorvastatin (LIPITOR) 40 MG tablet Take 1 tablet by mouth once daily   calcium carbonate (OSCAL) 1500 (600 Ca) MG TABS tablet Take 600 mg of elemental calcium by mouth 2 (two) times daily with a meal.   citalopram (CELEXA) 40 MG tablet Take 1 tablet (40 mg total) by mouth daily.    EPINEPHrine 0.3 mg/0.3 mL IJ SOAJ injection Inject 0.3 mg into the muscle as needed for anaphylaxis. Follow package instructions as needed for severe allergy or anaphylactic reaction.   ferrous gluconate (FERGON) 324 MG tablet Take 1 tablet (324 mg total) by mouth 2 (two) times daily with a meal.   folic acid (FOLVITE) 400 MCG tablet Take 400 mcg by mouth daily.   gabapentin (NEURONTIN) 300 MG capsule Take 1 capsule (300 mg total) by mouth 3 (three) times daily.   lubiprostone (AMITIZA) 24 MCG capsule 24 mcg.   Magnesium Citrate 100 MG CAPS Take by mouth.   methotrexate (RHEUMATREX) 10 MG tablet Take 60 mg by mouth once a week. Caution: Chemotherapy. Protect from light.   midodrine (PROAMATINE) 5 MG tablet Take 1 tablet (5 mg total) by mouth 3 (three) times daily with meals.   nicotine (NICODERM CQ - DOSED IN MG/24 HOURS) 21 mg/24hr patch Place 1 patch (21 mg total) onto the skin daily.   pantoprazole (PROTONIX) 40 MG tablet Take 40 mg by mouth daily.   Pirfenidone 267 MG CAPS Take 1 capsule by mouth in the morning, at noon, and at bedtime.   predniSONE (DELTASONE) 2.5 MG tablet Take 2.5 mg by mouth daily.   No facility-administered medications prior to visit.      Objective    BP 117/69 (BP Location: Right Arm, Patient Position: Sitting, Cuff Size: Normal)   Pulse 85   Temp 98.2 F (36.8 C) (Oral)  Ht 5\' 2"  (1.575 m)   Wt 146 lb (66.2 kg)   LMP 01/14/2009   SpO2 97%   BMI 26.70 kg/m   Physical Exam Constitutional:      General: She is awake.     Appearance: She is well-developed.  HENT:     Head: Normocephalic.  Eyes:     Conjunctiva/sclera: Conjunctivae normal.  Cardiovascular:     Rate and Rhythm: Normal rate and regular rhythm.     Heart sounds: Normal heart sounds.  Pulmonary:     Effort: Pulmonary effort is normal.     Breath sounds: Normal breath sounds.  Abdominal:     General: Abdomen is flat.     Palpations: Abdomen is soft.     Tenderness: There is no  guarding.     Comments: Mild LUQ tenderness to deep palpation  Skin:    General: Skin is warm.  Neurological:     Mental Status: She is alert and oriented to person, place, and time.  Psychiatric:        Attention and Perception: Attention normal.        Mood and Affect: Mood normal.        Speech: Speech normal.        Behavior: Behavior is cooperative.    Results for orders placed or performed in visit on 09/23/22  POC COVID-19  Result Value Ref Range   SARS Coronavirus 2 Ag Negative Negative    Assessment & Plan     1. Fever, unspecified fever cause COVID negative. - POC COVID-19  2. Nausea and vomiting, unspecified vomiting type Recent episode of vomiting black material, ongoing nausea, and headaches. Patient is on blood thinners, which could exacerbate a potential GI bleed.  -Increase Pantoprazole to 40mg  twice daily. -Refer to GI for urgent outpatient endoscopy. -Advise patient to hydrate and eat bland, easy foods. -Instruct patient to go to the emergency room if she experiences dizziness, fainting, consistently low blood pressure, or further episodes of vomiting dark material/blood.  Currently vitals are stable and exam is benign.    - CBC w/Diff/Platelet - Comprehensive Metabolic Panel (CMET)   Return if symptoms worsen or fail to improve.      I, [name], PA-C have reviewed all documentation for this visit. The documentation on  09/23/22   for the exam, diagnosis, procedures, and orders are all accurate and complete. [name], PA-C [facility] [address] [place], [place], [address] Office: [phone] Fax: [phone]   [facility]

## 2022-09-24 ENCOUNTER — Telehealth: Payer: Self-pay | Admitting: Cardiovascular Disease

## 2022-09-24 ENCOUNTER — Encounter: Payer: Self-pay | Admitting: Physician Assistant

## 2022-09-24 ENCOUNTER — Ambulatory Visit (INDEPENDENT_AMBULATORY_CARE_PROVIDER_SITE_OTHER): Payer: Medicaid Other | Admitting: Physician Assistant

## 2022-09-24 VITALS — BP 108/69 | HR 80 | Temp 98.2°F | Ht 62.0 in | Wt 147.2 lb

## 2022-09-24 DIAGNOSIS — K921 Melena: Secondary | ICD-10-CM

## 2022-09-24 DIAGNOSIS — R1013 Epigastric pain: Secondary | ICD-10-CM | POA: Diagnosis not present

## 2022-09-24 DIAGNOSIS — R112 Nausea with vomiting, unspecified: Secondary | ICD-10-CM

## 2022-09-24 DIAGNOSIS — R195 Other fecal abnormalities: Secondary | ICD-10-CM

## 2022-09-24 DIAGNOSIS — K5901 Slow transit constipation: Secondary | ICD-10-CM

## 2022-09-24 DIAGNOSIS — R1084 Generalized abdominal pain: Secondary | ICD-10-CM | POA: Diagnosis not present

## 2022-09-24 LAB — CBC WITH DIFFERENTIAL/PLATELET
Basophils Absolute: 0.1 10*3/uL (ref 0.0–0.2)
Basos: 1 %
EOS (ABSOLUTE): 0.9 10*3/uL — ABNORMAL HIGH (ref 0.0–0.4)
Eos: 10 %
Hematocrit: 41.6 % (ref 34.0–46.6)
Hemoglobin: 13.7 g/dL (ref 11.1–15.9)
Immature Grans (Abs): 0.1 10*3/uL (ref 0.0–0.1)
Immature Granulocytes: 1 %
Lymphocytes Absolute: 1.7 10*3/uL (ref 0.7–3.1)
Lymphs: 19 %
MCH: 29.9 pg (ref 26.6–33.0)
MCHC: 32.9 g/dL (ref 31.5–35.7)
MCV: 91 fL (ref 79–97)
Monocytes Absolute: 0.7 10*3/uL (ref 0.1–0.9)
Monocytes: 7 %
Neutrophils Absolute: 5.7 10*3/uL (ref 1.4–7.0)
Neutrophils: 62 %
Platelets: 581 10*3/uL — ABNORMAL HIGH (ref 150–450)
RBC: 4.58 x10E6/uL (ref 3.77–5.28)
RDW: 15.4 % (ref 11.7–15.4)
WBC: 9.1 10*3/uL (ref 3.4–10.8)

## 2022-09-24 LAB — COMPREHENSIVE METABOLIC PANEL
ALT: 16 IU/L (ref 0–32)
AST: 17 IU/L (ref 0–40)
Albumin: 4.1 g/dL (ref 3.8–4.9)
Alkaline Phosphatase: 108 IU/L (ref 44–121)
BUN/Creatinine Ratio: 13 (ref 9–23)
BUN: 8 mg/dL (ref 6–24)
Bilirubin Total: 0.2 mg/dL (ref 0.0–1.2)
CO2: 22 mmol/L (ref 20–29)
Calcium: 9.2 mg/dL (ref 8.7–10.2)
Chloride: 102 mmol/L (ref 96–106)
Creatinine, Ser: 0.6 mg/dL (ref 0.57–1.00)
Globulin, Total: 2.5 g/dL (ref 1.5–4.5)
Glucose: 83 mg/dL (ref 70–99)
Potassium: 4.8 mmol/L (ref 3.5–5.2)
Sodium: 140 mmol/L (ref 134–144)
Total Protein: 6.6 g/dL (ref 6.0–8.5)
eGFR: 107 mL/min/{1.73_m2} (ref >59)

## 2022-09-24 MED ORDER — ONDANSETRON HCL 4 MG PO TABS: 4.0000 mg | ORAL_TABLET | Freq: Two times a day (BID) | ORAL | 2 refills | Status: AC

## 2022-09-24 MED ORDER — LINACLOTIDE 290 MCG PO CAPS: 290.0000 ug | ORAL_CAPSULE | Freq: Every day | ORAL | 5 refills | Status: DC

## 2022-09-24 NOTE — Patient Instructions (Signed)
Ultrasound scheduled  09/28/2022 @ 8;15 arrival. Nothing to eat/drink after midnight.

## 2022-09-24 NOTE — Telephone Encounter (Signed)
   Pre-operative Risk Assessment    Patient Name: Alexandra [name]  DOB: [birth year] MRN: [id number]      Request for Surgical Clearance    Procedure:   colonoscopy  Date of Surgery:  Clearance TBD                                 Surgeon:  TBD Surgeon's Group or Practice Name:  [facility] Phone number:  [phone] Fax number:  [phone]   Type of Clearance Requested:   - Medical    Type of Anesthesia:  Not Indicated   Additional requests/questions:   fax a note of Medical clearance to [phone] Att: Leander Rams  SignedShawna Orleans   09/24/2022, 1:42 PM

## 2022-09-24 NOTE — Progress Notes (Signed)
Alexandra Amy, PA-C [address]  [address]  [place], [place] [address]  Main: [phone]  Fax: [phone]   Primary Care Physician: [name], PA-C  Primary Gastroenterologist:  Alexandra Amy, PA-C / Dr. Wyline Mood    CC: Follow-up dark stools, N/V, and chronic constipation  HPI: Alexandra [name] is a 52 y.o. female returns for follow-up of dark stools and chronic constipation.  Denies use of Pepto-Bismol.  She is taking iron tablet and folic acid daily.  History of PVD with stent and right BKA a in 2022.  Currently on Eliquis and aspirin, prescribed by her rheumatologist Dr Avelino Leeds.  She has history of chronic constipation.  Has tried MiraLAX, Ex-Lax, and Amitiza 24 mcg twice daily with little benefit.  Abdominal x-ray 6 weeks ago showed constipation  Was given GoLytely colon purge and fleets enemas for constipation.  After that was started on Linzess and was lost to follow-up.  She is not currently taking Linzess.  She states 290 dose worked well and she needs prescription.  Currently having bowel movement once per week with starting only straining.  She has had some epigastric pain and nausea.  Plan was to schedule EGD and colonoscopy once her constipation had improved.  No previous EGD or colonoscopy.  She states she is having worsening nausea throughout the day.  Feels off balance and dizzy.  Recently saw cardiologist Dr. Mariah Milling.  Is also followed by pulmonologist for pulmonary fibrosis.  She continues to have dark stools.  Had 2 episodes of black emesis in the past week.  Denies shortness of breath.  Denies NSAID use.  She is not on oxygen.  She has mild abdominal discomfort in the epigastrium and bilateral lower abdomen.  Yesterday she was told by her PCP to increase pantoprazole to 40 mg twice daily.  She was taking it once daily for acid reflux.  Abdominal pelvic CT and labs 05/2022 showed constipation, otherwise unrevealing.  Last labs 09/23/2022 showed normal CBC and  CMP except mildly elevated platelets 581.  Normal hemoglobin 13.7.  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 60 tablet 4   aspirin EC 81 MG EC tablet Take 1 tablet (81 mg total) by mouth daily. Swallow whole. 90 tablet 3   atorvastatin (LIPITOR) 40 MG tablet Take 1 tablet by mouth once daily 90 tablet 0   calcium carbonate (OSCAL) 1500 (600 Ca) MG TABS tablet Take 600 mg of elemental calcium by mouth 2 (two) times daily with a meal.     citalopram (CELEXA) 40 MG tablet Take 1 tablet (40 mg total) by mouth daily. 90 tablet 3   EPINEPHrine 0.3 mg/0.3 mL IJ SOAJ injection Inject 0.3 mg into the muscle as needed for anaphylaxis. Follow package instructions as needed for severe allergy or anaphylactic reaction. 2 each 3   ferrous gluconate (FERGON) 324 MG tablet Take 1 tablet (324 mg total) by mouth 2 (two) times daily with a meal. 60 tablet 3  folic acid (FOLVITE) 400 MCG tablet Take 400 mcg by mouth daily.     gabapentin (NEURONTIN) 300 MG capsule Take 1 capsule (300 mg total) by mouth 3 (three) times daily. 270 capsule 1   linaclotide (LINZESS) 290 MCG CAPS capsule Take 1 capsule (290 mcg total) by mouth daily before breakfast. 30 capsule 5   lubiprostone (AMITIZA) 24 MCG capsule 24 mcg.     Magnesium Citrate 100 MG CAPS Take by mouth.     methotrexate (RHEUMATREX) 10 MG tablet  Take 60 mg by mouth once a week. Caution: Chemotherapy. Protect from light.     midodrine (PROAMATINE) 5 MG tablet Take 1 tablet (5 mg total) by mouth 3 (three) times daily with meals. 90 tablet 3   nicotine (NICODERM CQ - DOSED IN MG/24 HOURS) 21 mg/24hr patch Place 1 patch (21 mg total) onto the skin daily. 28 patch 0   ondansetron (ZOFRAN) 4 MG tablet Take 1 tablet (4 mg total) by mouth 2 (two) times daily. 60 tablet 2   pantoprazole (PROTONIX) 40 MG tablet Take 40 mg by mouth daily.     Pirfenidone 267 MG CAPS Take 1 capsule by mouth in the morning, at noon, and at bedtime.     predniSONE (DELTASONE) 2.5 MG tablet Take 2.5 mg by mouth daily.     No current facility-administered medications for this visit.    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)     Past Surgical History:  Procedure Laterality Date   ABDOMINAL HYSTERECTOMY     AMPUTATION Right 09/12/2020   Procedure: AMPUTATION BELOW KNEE;  Surgeon: Annice Needy, MD;  Location: [facility] ORS;  Service: Vascular;  Laterality: Right;   BREAST CYST EXCISION Left 2006   ENDOVASCULAR STENT INSERTION N/A 09/08/2020   Procedure: ENDOVASCULAR STENT GRAFT INSERTION;  Surgeon: Annice Needy, MD;  Location: [facility];  Service: Vascular;  Laterality: N/A;   LOWER EXTREMITY ANGIOGRAPHY Right 09/01/2020   Procedure: Lower Extremity Angiography;  Surgeon: Annice Needy, MD;  Location: [facility];  Service: Cardiovascular;  Laterality: Right;   LOWER EXTREMITY ANGIOGRAPHY Left 10/01/2020   Procedure: LOWER EXTREMITY ANGIOGRAPHY;  Surgeon: Annice Needy, MD;  Location: [facility];  Service: Cardiovascular;  Laterality: Left;   LOWER  EXTREMITY INTERVENTION Right 09/07/2020   Procedure: LOWER EXTREMITY INTERVENTION;  Surgeon: Nada Libman, MD;  Location: [facility];  Service: Cardiovascular;  Laterality: Right;    Review of Systems:    All systems reviewed and negative except where noted in HPI.   Physical Examination:   BP 108/69   Pulse 80   Temp 98.2 F (36.8 C)   Ht 5\' 2"  (1.575 m)   Wt 147 lb 3.2 oz (66.8 kg)   LMP 01/14/2009   BMI 26.92 kg/m   General: Well-nourished, well-developed in no acute distress.  Eyes: No icterus. Conjunctivae pink. Mouth: Oropharyngeal mucosa moist and pink , no lesions erythema or exudate. Lungs: Clear to auscultation bilaterally. Non-labored. Heart: Regular rate and rhythm, no murmurs rubs or gallops.  Abdomen: Bowel sounds are normal; Abdomen is Soft; No  hepatosplenomegaly, masses or hernias;  Very Mild epigastric and bilateral lower Abdominal Tenderness; No guarding or rebound tenderness. Extremities: Right leg lower prosthetic. Neuro: Alert and oriented x 3.  Grossly intact.  Walks with a cane. Psych: Alert and cooperative, depressed mood and affect.   Imaging Studies: No results found.  Assessment and Plan:   Alexandra [name] is a 52 y.o. y/o female returns for follow-up of chronic constipation, worsening nausea and vomiting, dark emesis, dark stools, and GERD.  Currently on Eliquis and aspirin.  Scheduling EGD and colonoscopy to evaluate for GI bleeding.  Lab work yesterday showed normal hemoglobin 13g, no anemia.  1.  Nausea and vomiting; dark emesis; lab yesterday 09/23/22 showed normal hemoglobin 13g.  Scheduling EGD I discussed risks of EGD with patient to include risk of bleeding, perforation, and risk of sedation.   Patient expressed understanding and agrees to proceed with EGD.   Continue pantoprazole 40 Mg twice daily.  I prescribed Zofran 4 Mg 1 tablet twice daily as needed for nausea.  2.  Dark stools  Scheduling Colonoscopy I discussed  risks of colonoscopy with patient to include risk of bleeding, colon perforation, and risk of sedation.   Patient expressed understanding and agrees to proceed with colonoscopy.   3.  Chronic constipation  Prescribed Linzess 1 capsule once daily.  Drink 64 ounces of water daily.  High-fiber diet with fruits, vegetables, and whole grains.  4.  Chronic anticoagulation /pulmonary fibrosis /vasculitis /atherosclerosis /history of aortic thrombus;  Requesting permission to hold Eliquis 2 days prior to EGD/colonoscopy procedures from Dr. Allena Katz.  Cardiac and pulmonology clearance sent.   Alexandra Amy, PA-C  Follow up in 4 weeks after EGD and colonoscopy procedure with TG.

## 2022-09-25 ENCOUNTER — Emergency Department
Admission: EM | Admit: 2022-09-25 | Discharge: 2022-09-25 | Discharge status: Against Medical Advice | Payer: Medicaid Other | Attending: Emergency Medicine | Admitting: Emergency Medicine

## 2022-09-25 ENCOUNTER — Other Ambulatory Visit: Payer: Self-pay

## 2022-09-25 DIAGNOSIS — R110 Nausea: Secondary | ICD-10-CM | POA: Insufficient documentation

## 2022-09-25 DIAGNOSIS — Z5321 Procedure and treatment not carried out due to patient leaving prior to being seen by health care provider: Secondary | ICD-10-CM | POA: Insufficient documentation

## 2022-09-25 DIAGNOSIS — R042 Hemoptysis: Secondary | ICD-10-CM | POA: Diagnosis present

## 2022-09-25 LAB — COMPREHENSIVE METABOLIC PANEL
ALT: 15 U/L (ref 0–44)
AST: 22 U/L (ref 15–41)
Albumin: 4 g/dL (ref 3.5–5.0)
Alkaline Phosphatase: 93 U/L (ref 38–126)
Anion gap: 11 (ref 5–15)
BUN: 13 mg/dL (ref 6–20)
CO2: 25 mmol/L (ref 22–32)
Calcium: 8.8 mg/dL — ABNORMAL LOW (ref 8.9–10.3)
Chloride: 102 mmol/L (ref 98–111)
Creatinine, Ser: 0.55 mg/dL (ref 0.44–1.00)
GFR, Estimated: >60 mL/min (ref >60)
Glucose, Bld: 91 mg/dL (ref 70–99)
Potassium: 3.8 mmol/L (ref 3.5–5.1)
Sodium: 138 mmol/L (ref 135–145)
Total Bilirubin: 0.5 mg/dL (ref 0.3–1.2)
Total Protein: 7.5 g/dL (ref 6.5–8.1)

## 2022-09-25 LAB — CBC
HCT: 40.7 % (ref 36.0–46.0)
Hemoglobin: 13.3 g/dL (ref 12.0–15.0)
MCH: 29.6 pg (ref 26.0–34.0)
MCHC: 32.7 g/dL (ref 30.0–36.0)
MCV: 90.6 fL (ref 80.0–100.0)
Platelets: 503 K/uL — ABNORMAL HIGH (ref 150–400)
RBC: 4.49 MIL/uL (ref 3.87–5.11)
RDW: 16.7 % — ABNORMAL HIGH (ref 11.5–15.5)
WBC: 8.7 K/uL (ref 4.0–10.5)
nRBC: 0 % (ref 0.0–0.2)

## 2022-09-25 LAB — LIPASE, BLOOD: Lipase: 35 U/L (ref 11–51)

## 2022-09-25 NOTE — ED Triage Notes (Signed)
Pt to ED for spitting up bright red blood 4 times since this AM. No vomiting. This started while she was bent over cleaning. Last week was having black vomit, saw GI on Thursday. States hasn't felt food for a week: nauseous, headaches, just doesn't feel right. No abdominal pain.  Pt takes 5mg  Eliquis BID. Blue top sent with labs.

## 2022-09-27 ENCOUNTER — Telehealth: Payer: Self-pay

## 2022-09-27 ENCOUNTER — Emergency Department
Admission: EM | Admit: 2022-09-27 | Discharge: 2022-09-27 | Disposition: A | Discharge status: Home / Self Care | Payer: Medicaid Other | Source: Home / Self Care | Attending: Emergency Medicine | Admitting: Emergency Medicine

## 2022-09-27 ENCOUNTER — Encounter: Payer: Self-pay | Admitting: Oncology

## 2022-09-27 ENCOUNTER — Emergency Department: Payer: Medicaid Other

## 2022-09-27 ENCOUNTER — Other Ambulatory Visit: Payer: Self-pay

## 2022-09-27 DIAGNOSIS — R519 Headache, unspecified: Secondary | ICD-10-CM | POA: Diagnosis present

## 2022-09-27 DIAGNOSIS — R110 Nausea: Secondary | ICD-10-CM | POA: Diagnosis not present

## 2022-09-27 DIAGNOSIS — R109 Unspecified abdominal pain: Secondary | ICD-10-CM | POA: Insufficient documentation

## 2022-09-27 DIAGNOSIS — J0100 Acute maxillary sinusitis, unspecified: Secondary | ICD-10-CM | POA: Diagnosis not present

## 2022-09-27 LAB — CBC
HCT: 39.5 % (ref 36.0–46.0)
Hemoglobin: 13.2 g/dL (ref 12.0–15.0)
MCH: 29.9 pg (ref 26.0–34.0)
MCHC: 33.4 g/dL (ref 30.0–36.0)
MCV: 89.6 fL (ref 80.0–100.0)
Platelets: 527 10*3/uL — ABNORMAL HIGH (ref 150–400)
RBC: 4.41 MIL/uL (ref 3.87–5.11)
RDW: 17.3 % — ABNORMAL HIGH (ref 11.5–15.5)
WBC: 10.1 10*3/uL (ref 4.0–10.5)
nRBC: 0 % (ref 0.0–0.2)

## 2022-09-27 LAB — URINALYSIS, ROUTINE W REFLEX MICROSCOPIC
Bilirubin Urine: NEGATIVE
Glucose, UA: NEGATIVE mg/dL
Hgb urine dipstick: NEGATIVE
Ketones, ur: NEGATIVE mg/dL
Leukocytes,Ua: NEGATIVE
Nitrite: NEGATIVE
Protein, ur: NEGATIVE mg/dL
Specific Gravity, Urine: 1.009 (ref 1.005–1.030)
pH: 7 (ref 5.0–8.0)

## 2022-09-27 LAB — COMPREHENSIVE METABOLIC PANEL
ALT: 16 U/L (ref 0–44)
AST: 20 U/L (ref 15–41)
Albumin: 3.9 g/dL (ref 3.5–5.0)
Alkaline Phosphatase: 93 U/L (ref 38–126)
Anion gap: 9 (ref 5–15)
BUN: 9 mg/dL (ref 6–20)
CO2: 26 mmol/L (ref 22–32)
Calcium: 9.1 mg/dL (ref 8.9–10.3)
Chloride: 102 mmol/L (ref 98–111)
Creatinine, Ser: 0.5 mg/dL (ref 0.44–1.00)
GFR, Estimated: >60 mL/min (ref >60)
Glucose, Bld: 98 mg/dL (ref 70–99)
Potassium: 4.2 mmol/L (ref 3.5–5.1)
Sodium: 137 mmol/L (ref 135–145)
Total Bilirubin: 0.7 mg/dL (ref 0.3–1.2)
Total Protein: 7.5 g/dL (ref 6.5–8.1)

## 2022-09-27 LAB — LIPASE, BLOOD: Lipase: 33 U/L (ref 11–51)

## 2022-09-27 MED ORDER — TRAMADOL HCL 50 MG PO TABS: 50.0000 mg | ORAL_TABLET | Freq: Four times a day (QID) | ORAL | 0 refills | Status: DC | PRN

## 2022-09-27 MED ORDER — AMOXICILLIN-POT CLAVULANATE 875-125 MG PO TABS: 1.0000 | ORAL_TABLET | Freq: Two times a day (BID) | ORAL | 0 refills | Status: AC

## 2022-09-27 MED ORDER — GOLYTELY 236 G PO SOLR: 4000.0000 mL | Freq: Once | ORAL | 0 refills | Status: AC

## 2022-09-27 NOTE — Telephone Encounter (Signed)
Pulmonary clearance received from Dr.Fuad-patient is cleared to have procedure. Patient high risk for bleeding on Eliquis and ASA for Aortic Thrombus.  Cardiac Clearance has been sent.

## 2022-09-27 NOTE — ED Provider Notes (Signed)
[facility] Provider Note    Event Date/Time   First MD Initiated Contact with Patient 09/27/22 1210     (approximate)   History   Abdominal Pain   HPI  Alexandra [name] is a 52 y.o. female who presents with complaints of headache and nausea.  Patient describes over the last week or so she has had a headache primarily frontal headache and has had nausea and abdominal discomfort as well.  She reports today she had some blood in her mouth that she spat out.  She denies shortness of breath, no chest pain.  Spoke with and referred to the emergency department     Physical Exam   Triage Vital Signs: ED Triage Vitals  Encounter Vitals Group     BP 09/27/22 1147 (!) 113/43     Systolic BP Percentile --      Diastolic BP Percentile --      Pulse Rate 09/27/22 1147 92     Resp 09/27/22 1147 17     Temp 09/27/22 1147 99.3 F (37.4 C)     Temp src --      SpO2 09/27/22 1147 94 %     Weight --      Height --      Head Circumference --      Peak Flow --      Pain Score 09/27/22 1146 10     Pain Loc --      Pain Education --      Exclude from Growth Chart --     Most recent vital signs: Vitals:   09/27/22 1147  BP: (!) 113/43  Pulse: 92  Resp: 17  Temp: 99.3 F (37.4 C)  SpO2: 94%     General: Awake, no distress.  CV:  Good peripheral perfusion.  Resp:  Normal effort.  Abd:  No distention.  Reassuring exam Other:     ED Results / Procedures / Treatments   Labs (all labs ordered are listed, but only abnormal results are displayed) Labs Reviewed  CBC - Abnormal; Notable for the following components:      Result Value   RDW 17.3 (*)    Platelets 527 (*)    All other components within normal limits  URINALYSIS, ROUTINE W REFLEX MICROSCOPIC - Abnormal; Notable for the following components:   Color, Urine YELLOW (*)    APPearance HAZY (*)    All other components within normal limits  LIPASE, BLOOD  COMPREHENSIVE METABOLIC PANEL      EKG     RADIOLOGY CT head obtained    PROCEDURES:  Critical Care performed:   Procedures   MEDICATIONS ORDERED IN ED: Medications - No data to display   IMPRESSION / MDM / ASSESSMENT AND PLAN / ED COURSE  I reviewed the triage vital signs and the nursing notes. Patient's presentation is most consistent with acute presentation with potential threat to life or bodily function.  Patient complains of possible hematemesis however none since.  No abdominal pain.  Complains also of headache x 1 week worse when leaning forward which is made her dizzy.  Lab work is reassuring, hemoglobin is consistent with prior levels, no significant change periods  Given week of worsening headache, CT obtained which demonstrates left maxillary sinusitis, this may be a cause of her headache.  Will treat with Augmentin.  Given no further GI upset, no further hematemesis, normal stools, appropriate for discharge at this time with strict return precautions, she has  GI follow-up arranged        FINAL CLINICAL IMPRESSION(S) / ED DIAGNOSES   Final diagnoses:  Acute maxillary sinusitis, recurrence not specified     Rx / DC Orders   ED Discharge Orders          Ordered    amoxicillin-clavulanate (AUGMENTIN) 875-125 MG tablet  2 times daily        09/27/22 1427    traMADol (ULTRAM) 50 MG tablet  Every 6 hours PRN        09/27/22 1427             Note:  This document was prepared using Dragon voice recognition software and may include unintentional dictation errors.   Jene Every, MD 09/27/22 [phone]

## 2022-09-27 NOTE — Telephone Encounter (Signed)
States she saw the PA on 09/24/2022 and was told if she had any problem over the weekend to go to the ER or call our office. She states late yesterday afternoon she was cleaning and bent over and she noticed her mouth was filling up with Saliva and she spit it out and it was bright red blood. She did this 2 more times. She states her blood pressure went up to a 132 which is really high for her. She states that she went to there ER but waited so long that she just left because she could not wait any longer. She states she has not spit up blood any more since then but she does not feel right. She is having a little pain in her sides of her abdominal that radiate to her back. She states she has had a 10 out of 10 headache for the past week. She has taken tylenol and it has not helped. She states she does not want to go back to the ER. Was going to move procedure up to tomorrow with Dr. Tobi Bastos but patient is on Eliquis. We are still waiting on Cardiology approval for the Eliquis. She states cardiology does not prescribed this medication for her it is Dr. Allena Katz her Rheumatologist but in Epic Dr. Sheppard Plumber the Vascular Surgeon prescribed the medication for patient      Spoke with patient and she was strongly encouraged to go to the ER. +

## 2022-09-27 NOTE — Telephone Encounter (Signed)
States she saw the PA on 09/24/2022 and was told if she had any problem over the weekend to go to the ER or call our office. She states late yesterday afternoon she was cleaning and bent over and she noticed her mouth was filling up with Saliva and she spit it out and it was bright red blood. She did this 2 more times. She states her blood pressure went up to a 132 which is really high for her. She states that she went to there ER but waited so long that she just left because she could not wait any longer. She states she has not spit up blood any more since then but she does not feel right. She is having a little pain in her sides of her abdominal that radiate to her back. She states she has had a 10 out of 10 headache for the past week. She has taken tylenol and it has not helped. She states she does not want to go back to the ER. Was going to move procedure up to tomorrow with Dr. Tobi Bastos but patient is on Eliquis. We are still waiting on Cardiology approval for the Eliquis. She states cardiology does not prescribed this medication for her it is Dr. Allena Katz her Rheumatologist but in Epic Dr. Sheppard Plumber the Vascular Surgeon prescribed the medication for patient

## 2022-09-27 NOTE — ED Triage Notes (Signed)
Pt comes with c/o sever headache, nausea, temp and vomiting. Pt states she was here on Saturday but left due to long wait. Pt states bright red blood as well. Pt is on eliquis.

## 2022-09-27 NOTE — ED Notes (Signed)
See triage notes. Patient stated that she has had a headache and N/V

## 2022-09-28 ENCOUNTER — Ambulatory Visit
Admission: RE | Admit: 2022-09-28 | Discharge: 2022-09-28 | Disposition: A | Discharge status: Home / Self Care | Payer: Medicaid Other | Source: Ambulatory Visit | Attending: Physician Assistant | Admitting: Physician Assistant

## 2022-09-28 ENCOUNTER — Ambulatory Visit: Payer: Medicaid Other

## 2022-09-28 DIAGNOSIS — R1084 Generalized abdominal pain: Secondary | ICD-10-CM | POA: Diagnosis present

## 2022-09-28 DIAGNOSIS — R112 Nausea with vomiting, unspecified: Secondary | ICD-10-CM | POA: Diagnosis not present

## 2022-09-28 NOTE — Progress Notes (Signed)
Notify patient RUQ ultrasound is normal.  No gallstones.  Normal liver.  Normal common bile duct.  Continue with current plan.  Of note, she went to ED yesterday 7/15.  CBC, CMP, and lipase labs were all normal.  Hemoglobin 13.2, no anemia.

## 2022-09-29 ENCOUNTER — Other Ambulatory Visit: Payer: Self-pay | Admitting: Student

## 2022-09-29 ENCOUNTER — Encounter: Payer: Self-pay | Admitting: Student

## 2022-09-29 DIAGNOSIS — R519 Headache, unspecified: Secondary | ICD-10-CM

## 2022-09-29 DIAGNOSIS — R4189 Other symptoms and signs involving cognitive functions and awareness: Secondary | ICD-10-CM

## 2022-09-30 ENCOUNTER — Telehealth: Payer: Self-pay

## 2022-09-30 ENCOUNTER — Telehealth: Payer: Self-pay | Admitting: Family Medicine

## 2022-09-30 NOTE — Telephone Encounter (Signed)
Please schedule patient for preoperative visit for colonoscopy clearance to discuss anticoagulation

## 2022-09-30 NOTE — Telephone Encounter (Signed)
Rheumatolgy clearance received -patient cleared to have procedure -however to stop the Eliquis needs to come from PCP.  Eliquis clearance faxed to [employer].

## 2022-10-04 ENCOUNTER — Encounter: Payer: Self-pay | Admitting: Student

## 2022-10-05 ENCOUNTER — Ambulatory Visit: Payer: Medicaid Other

## 2022-10-06 NOTE — Telephone Encounter (Signed)
Left message for patient to call back to schedule an OV with Dr. Roxan Hockey at least 8 days prior to her colonoscopy.

## 2022-10-06 NOTE — Telephone Encounter (Signed)
   Name: Alexandra [name]  DOB: [birth year]  MRN: [id number]  Primary Cardiologist: None  Chart reviewed as part of pre-operative protocol coverage. The patient has an upcoming visit scheduled with Eula Listen, PA  on 11/02/2022 at which time clearance can be addressed in case there are any issues that would impact surgical recommendations.  Colonoscopy Is not scheduled until 11/10/2022 as below. I added preop FYI to appointment note so that provider is aware to address at time of outpatient visit.  Per office protocol the cardiology provider should forward their finalized clearance decision and recommendations regarding antiplatelet therapy to the requesting party below.    Patient on Eliquis for vascular indications clearance to be determined by the vascular team.  I will route this message as FYI to requesting party and remove this message from the preop box as separate preop APP input not needed at this time.   Please call with any questions.  Joni Reining, NP  10/06/2022, 11:43 AM

## 2022-10-06 NOTE — Telephone Encounter (Signed)
Pt has an appt with Eula Listen 8/20. Per preop ok to be cleared that day. Appt notes were already updated. I will send this note to [facility]

## 2022-10-12 ENCOUNTER — Ambulatory Visit: Payer: Medicaid Other

## 2022-10-12 ENCOUNTER — Ambulatory Visit: Payer: Medicaid Other | Admitting: Physician Assistant

## 2022-10-14 ENCOUNTER — Encounter: Payer: Self-pay | Admitting: Student

## 2022-10-15 ENCOUNTER — Ambulatory Visit
Admission: RE | Admit: 2022-10-15 | Discharge: 2022-10-15 | Disposition: A | Discharge status: Home / Self Care | Payer: Medicaid Other | Source: Ambulatory Visit | Attending: Student | Admitting: Student

## 2022-10-15 DIAGNOSIS — R519 Headache, unspecified: Secondary | ICD-10-CM

## 2022-10-15 MED ORDER — IOPAMIDOL (ISOVUE-370) INJECTION 76%
75.0000 mL | Freq: Once | INTRAVENOUS | Status: AC | PRN
  Administered 2022-10-15: 75 mL via INTRAVENOUS

## 2022-10-15 NOTE — Progress Notes (Unsigned)
Established patient visit   Patient: Alexandra [name]   DOB: December 21, 1970   52 y.o. Female  MRN: [id number] Visit Date: 10/20/2022  Today's healthcare provider: Ronnald Ramp, MD   No chief complaint on file.  Subjective      Reviewed patient's medical hx and medications  By calculation of the revised cardiac risk index, patient is considered low*** risk with 3.9***% risk of death,MI or cardiac event in 30 days after non-cardiac surgery, class I risk (0 points)  Further testing recommended: *** Anticoagulation Plan: ***  Patient is recommended to proceed with surgery as scheduled ***       Medications: Outpatient Medications Prior to Visit  Medication Sig   apixaban (ELIQUIS) 5 MG TABS tablet Take 1 tablet (5 mg total) by mouth 2 (two) times daily.   aspirin EC 81 MG EC tablet Take 1 tablet (81 mg total) by mouth daily. Swallow whole.   atorvastatin (LIPITOR) 40 MG tablet Take 1 tablet by mouth once daily   calcium carbonate (OSCAL) 1500 (600 Ca) MG TABS tablet Take 600 mg of elemental calcium by mouth 2 (two) times daily with a meal.   citalopram (CELEXA) 40 MG tablet Take 1 tablet (40 mg total) by mouth daily.   EPINEPHrine 0.3 mg/0.3 mL IJ SOAJ injection Inject 0.3 mg into the muscle as needed for anaphylaxis. Follow package instructions as needed for severe allergy or anaphylactic reaction.   ferrous gluconate (FERGON) 324 MG tablet Take 1 tablet (324 mg total) by mouth 2 (two) times daily with a meal.   folic acid (FOLVITE) 400 MCG tablet Take 400 mcg by mouth daily.   gabapentin (NEURONTIN) 300 MG capsule Take 1 capsule (300 mg total) by mouth 3 (three) times daily.   linaclotide (LINZESS) 290 MCG CAPS capsule Take 1 capsule (290 mcg total) by mouth daily before breakfast.   lubiprostone (AMITIZA) 24 MCG capsule 24 mcg.   Magnesium Citrate 100 MG CAPS Take by mouth.   methotrexate (RHEUMATREX) 10 MG tablet Take 60 mg by mouth once a week. Caution:  Chemotherapy. Protect from light.   midodrine (PROAMATINE) 5 MG tablet Take 1 tablet (5 mg total) by mouth 3 (three) times daily with meals.   nicotine (NICODERM CQ - DOSED IN MG/24 HOURS) 21 mg/24hr patch Place 1 patch (21 mg total) onto the skin daily.   ondansetron (ZOFRAN) 4 MG tablet Take 1 tablet (4 mg total) by mouth 2 (two) times daily.   pantoprazole (PROTONIX) 40 MG tablet Take 40 mg by mouth daily.   Pirfenidone 267 MG CAPS Take 1 capsule by mouth in the morning, at noon, and at bedtime.   predniSONE (DELTASONE) 2.5 MG tablet Take 2.5 mg by mouth daily.   traMADol (ULTRAM) 50 MG tablet Take 1 tablet (50 mg total) by mouth every 6 (six) hours as needed.   No facility-administered medications prior to visit.    Review of Systems  {Insert previous labs (optional):23779}  {See past labs  Heme  Chem  Endocrine  Serology  Results Review (optional):1}   Objective    LMP 01/14/2009  {Insert last BP/Wt (optional):23777}  {See vitals history (optional):1}  Physical Exam  ***  No results found for any visits on 10/20/22.  Assessment & Plan     Problem List Items Addressed This Visit   None    No follow-ups on file.         Ronnald Ramp, MD  [facility] [phone]  phone) [phone] (fax)  [facility]

## 2022-10-19 ENCOUNTER — Ambulatory Visit: Payer: Medicaid Other

## 2022-10-20 ENCOUNTER — Other Ambulatory Visit: Payer: Self-pay

## 2022-10-20 ENCOUNTER — Encounter: Payer: Self-pay | Admitting: Family Medicine

## 2022-10-20 ENCOUNTER — Ambulatory Visit: Payer: Medicaid Other | Admitting: Family Medicine

## 2022-10-20 VITALS — BP 107/73 | HR 87 | Temp 98.2°F | Resp 12 | Ht 62.0 in | Wt 146.5 lb

## 2022-10-20 DIAGNOSIS — Z7901 Long term (current) use of anticoagulants: Secondary | ICD-10-CM | POA: Diagnosis not present

## 2022-10-20 DIAGNOSIS — I7410 Embolism and thrombosis of unspecified parts of aorta: Secondary | ICD-10-CM

## 2022-10-20 DIAGNOSIS — R112 Nausea with vomiting, unspecified: Secondary | ICD-10-CM | POA: Diagnosis not present

## 2022-10-20 MED ORDER — HYDROCODONE-ACETAMINOPHEN 5-325 MG PO TABS: 1.0000 | ORAL_TABLET | Freq: Three times a day (TID) | ORAL | 0 refills | Status: DC | PRN

## 2022-10-20 MED ORDER — ONDANSETRON 4 MG PO TBDP: 4.0000 mg | ORAL_TABLET | Freq: Three times a day (TID) | ORAL | 0 refills | Status: DC | PRN

## 2022-10-20 NOTE — Patient Instructions (Signed)
VISIT SUMMARY:  During your visit, we discussed your upcoming colonoscopy and EGD, your persistent headaches and daily nausea, your dry eyes, and your general health. We also discussed your recent episode of coughing up blood and your constipation. We have made plans to manage these issues and have scheduled several diagnostic tests to further investigate your symptoms.  YOUR PLAN:  -PERIOPERATIVE ANTICOAGULATION: This refers to managing your blood-thinning medication, Eliquis, around the time of your upcoming colonoscopy and EGD to balance the risk of blood clots and bleeding.   You should continue taking Eliquis until the morning of 11/09/2022, then stop on the evening of 11/09/2022 and the morning of 11/10/2022, and resume on the evening of 11/10/2022.  -CHRONIC HEADACHES: You have been experiencing severe headaches that are not relieved by Tramadol or over-the-counter medications. We will prescribe Norco 5/325mg  for severe headache episodes.  -NAUSEA: You have been experiencing daily nausea, which gets worse with movement. We will prescribe Zofran ODT 4mg  as needed for nausea.  -DRY EYES: You have been experiencing discomfort in your right eye, which feels gritty. Over-the-counter lubricants have not been effective. We will refer you to an ophthalmologist for further evaluation and treatment.  -GENERAL HEALTH MAINTENANCE: We plan to administer the second dose of the Shingles vaccine in September 2024. We will also schedule a follow-up visit after your colonoscopy and EGD.  INSTRUCTIONS:  Please continue taking your Eliquis as directed until the morning of 11/09/2022. Stop taking it on the evening of 11/09/2022 and the morning of 11/10/2022, and resume on the evening of 11/10/2022. Take Norco 5/325mg  for severe headache episodes and Zofran ODT 4mg  as needed for nausea. You will be referred to an ophthalmologist for further evaluation and treatment of your dry eyes. Remember to get your second dose of  the Shingles vaccine in September 2024, and we will schedule a follow-up visit after your colonoscopy and EGD.

## 2022-10-22 ENCOUNTER — Telehealth: Payer: Self-pay

## 2022-10-22 NOTE — Telephone Encounter (Signed)
Left detailed message lketting patient know her appointment with Inetta Fermo needs to be after her procedures on 11/10/22 - her new appointment is 11-23-22 @ 11:15 with tina.

## 2022-10-25 ENCOUNTER — Ambulatory Visit: Payer: Medicaid Other | Admitting: Physician Assistant

## 2022-10-26 ENCOUNTER — Encounter: Payer: Self-pay | Admitting: Cardiovascular Disease

## 2022-10-26 ENCOUNTER — Ambulatory Visit: Payer: Medicaid Other

## 2022-10-27 ENCOUNTER — Ambulatory Visit (INDEPENDENT_AMBULATORY_CARE_PROVIDER_SITE_OTHER): Payer: Medicaid Other

## 2022-10-27 ENCOUNTER — Ambulatory Visit: Payer: Medicaid Other | Attending: Cardiovascular Disease

## 2022-10-27 DIAGNOSIS — R0602 Shortness of breath: Secondary | ICD-10-CM | POA: Diagnosis not present

## 2022-10-27 DIAGNOSIS — I776 Arteritis, unspecified: Secondary | ICD-10-CM | POA: Diagnosis not present

## 2022-10-27 DIAGNOSIS — R55 Syncope and collapse: Secondary | ICD-10-CM | POA: Diagnosis not present

## 2022-10-27 LAB — ECHOCARDIOGRAM COMPLETE
AR max vel: 2.58 cm2
AV Area VTI: 2.55 cm2
AV Area mean vel: 2.55 cm2
AV Mean grad: 2 mmHg
AV Peak grad: 3.8 mmHg
Ao pk vel: 0.97 m/s
Area-P 1/2: 4.85 cm2
Calc EF: 49.9 %
S' Lateral: 3 cm
Single Plane A2C EF: 52.7 %
Single Plane A4C EF: 51.1 %

## 2022-11-01 NOTE — Progress Notes (Unsigned)
Cardiology Office Note    Date:  11/02/2022   ID:  Zeniya, Odegaard 01/31/1971, MRN [id number]  PCP:  Ronnald Ramp, MD  Cardiologist:  Julien Nordmann, MD  Electrophysiologist:  None   Chief Complaint: Preprocedure cardiac risk stratification   History of Present Illness:   Alexandra [name] is a 52 y.o. 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  Echo from 08/2020 showed an EF of 60 to 65%, no regional wall motion maladies, normal LV diastolic function parameters, normal RV systolic function and ventricular cavity size, and no significant valvular abnormalities.  She was evaluated as a new patient by Dr. Mariah Milling in 08/2022 at the request of outside office for lightheadedness, dizziness, and presyncope.  At that time, she was without symptoms of angina or cardiac decompensation.  It was noted BP had been running low at outside office in the 90s over 60s.  Dizziness was not positional.  Neurology had previously recommended vestibular rehab, PT, and carotid artery ultrasound.    Echo on 10/27/2022 showed an EF of 55 to 60%, no regional wall motion abnormalities, normal LV diastolic function parameters, normal RV systolic function and ventricular cavity size, no significant valvular abnormalities, and an estimated right atrial pressure of 3 mmHg.  Carotid artery ultrasound at that time showed  no evidence of hemodynamically significant stenosis in the bilateral ICAs with antegrade flow of the bilateral vertebral arteries, and normal flow hemodynamics in the bilateral subclavian arteries.  CTA head and neck in 10/2022 showed no evidence of acute intracranial abnormality with left maxillary sinusitis.  There was mild atherosclerotic plaque within the left carotid bulb as well as previously noted pulmonary fibrosis, cervical spondylosis.  There were no intracranial large vessel occlusions or proximal high-grade arterial stenosis.  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  MRI of the brain is scheduled for 11/2022.  She comes in today and is doing well from a cardiac perspective.  No symptoms of angina or cardiac decompensation.  She does notice some dyspnea with overexertion.  Dizziness and headache persists.  Some with near syncope without frank syncope.  No associated palpitations with dizziness, though she does report a prior history of palpitations.  Currently taking midodrine once daily in the morning, not laying down afterwards.  Reports a family history of CAD.  Duke Activity Status Index: > 4 METs without cardiac limitation  Revised Cardiac Risk Index: Class I with an estimated rate of 3.9% for adverse cardiac event in the periprocedure timeframe   Labs independently reviewed: 09/2022 - Hgb 13.2, PLT 527, potassium 4.2, BUN 9, serum creatinine 0.5, albumin 3.9, AST/ALT normal 07/2022 - A1c 6.2 01/2022 - TSH normal 08/2020 - TC 183, TG 143, HDL 36, LDL 118  Past Medical History:  Diagnosis Date   Anxiety    Breast discharge 2013   present X 3 years  Left only multiple ducts per pt   Clotting disorder (HCC)  Depression    Pulmonary fibrosis (HCC)    Rheumatoid arthritis (HCC)    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)     Past Surgical History:  Procedure Laterality Date   ABDOMINAL HYSTERECTOMY      AMPUTATION Right 09/12/2020   Procedure: AMPUTATION BELOW KNEE;  Surgeon: Annice Needy, MD;  Location: [facility];  Service: Vascular;  Laterality: Right;   BREAST CYST EXCISION Left 2006   ENDOVASCULAR STENT INSERTION N/A 09/08/2020   Procedure: ENDOVASCULAR STENT GRAFT INSERTION;  Surgeon: Annice Needy, MD;  Location: [facility];  Service: Vascular;  Laterality: N/A;   LOWER EXTREMITY ANGIOGRAPHY Right 09/01/2020   Procedure: Lower Extremity Angiography;  Surgeon: Annice Needy, MD;  Location: [facility];  Service: Cardiovascular;  Laterality: Right;   LOWER EXTREMITY ANGIOGRAPHY Left 10/01/2020   Procedure: LOWER EXTREMITY ANGIOGRAPHY;  Surgeon: Annice Needy, MD;  Location: [facility];  Service: Cardiovascular;  Laterality: Left;   LOWER EXTREMITY INTERVENTION Right 09/07/2020   Procedure: LOWER EXTREMITY INTERVENTION;  Surgeon: Nada Libman, MD;  Location: [facility];  Service: Cardiovascular;  Laterality: Right;    Current Medications: Current Meds  Medication Sig   apixaban (ELIQUIS) 5 MG TABS tablet Take 1 tablet (5 mg total) by mouth 2 (two) times daily.   aspirin EC 81 MG EC tablet Take 1 tablet (81 mg total) by mouth daily. Swallow whole.   atorvastatin (LIPITOR) 40 MG tablet Take 1 tablet by mouth once daily   calcium carbonate (OSCAL) 1500 (600 Ca) MG TABS tablet Take 600 mg of elemental calcium by mouth 2 (two) times daily with a meal.   citalopram (CELEXA) 40 MG tablet Take 1 tablet (40 mg total) by mouth daily.   EPINEPHrine 0.3 mg/0.3 mL IJ SOAJ injection Inject 0.3 mg into the muscle as needed for anaphylaxis. Follow package instructions as needed for severe allergy or anaphylactic reaction.   ferrous gluconate (FERGON) 324 MG tablet Take 1 tablet (324 mg total) by mouth 2 (two) times daily with a meal.   folic acid (FOLVITE) 400 MCG tablet Take 400 mcg by mouth daily.   gabapentin (NEURONTIN) 300 MG capsule Take 1 capsule (300 mg total) by mouth 3  (three) times daily.   HYDROcodone-acetaminophen (NORCO) 5-325 MG tablet Take 1 tablet by mouth every 8 (eight) hours as needed for moderate pain.   linaclotide (LINZESS) 290 MCG CAPS capsule Take 1 capsule (290 mcg total) by mouth daily before breakfast.   lubiprostone (AMITIZA) 24 MCG capsule 24 mcg.   Magnesium Citrate 100 MG CAPS Take by mouth.   methotrexate (RHEUMATREX) 10 MG tablet Take 60 mg by mouth once a week. Caution: Chemotherapy. Protect from light.   midodrine (PROAMATINE) 5 MG tablet Take 1 tablet (5 mg total) by mouth 3 (three) times daily with meals.   ondansetron (ZOFRAN-ODT) 4 MG disintegrating tablet Take 1 tablet (4 mg total) by mouth every 8 (eight) hours as needed for nausea or vomiting.   pantoprazole (PROTONIX) 40 MG tablet Take 40 mg by mouth daily. BID   Pirfenidone 267 MG CAPS Take 1 capsule by mouth in the morning, at noon, and at bedtime.   predniSONE (DELTASONE) 2.5 MG tablet Take 2.5 mg by mouth daily.   VRAYLAR 1.5 MG capsule Take 1.5 mg by mouth daily.    Allergies:   Patient has no known allergies.   Social History   Socioeconomic History   Marital status: Divorced  Spouse name: Not on file   Number of children: Not on file   Years of education: Not on file   Highest education level: Not on file  Occupational History   Not on file  Tobacco Use   Smoking status: Some Days    Current packs/day: 1.00    Average packs/day: 1 pack/day for 29.0 years (29.0 ttl pk-yrs)    Types: Cigarettes   Smokeless tobacco: Never   Tobacco comments:    Smokes 0.5 PPD 09/07/2022  Vaping Use   Vaping status: Never Used  Substance and Sexual Activity   Alcohol use: Not Currently    Comment: social   Drug use: No   Sexual activity: Yes  Other Topics Concern   Not on file  Social History Narrative   Not on file   Social Determinants of Health   Financial Resource Strain: Not on file  Food Insecurity: Not on file  Transportation Needs: Not on file   Physical Activity: Not on file  Stress: Not on file  Social Connections: Not on file     Family History:  The patient's family history includes Breast cancer in her maternal aunt, maternal aunt, and maternal grandmother; Breast cancer (age of onset: 68) in her cousin; Breast cancer (age of onset: 23) in her mother; Congestive Heart Failure in her father; Diabetes in her brother, maternal grandfather, and paternal aunt.  ROS:   12-point review of systems is negative unless otherwise noted in the HPI.   EKGs/Labs/Other Studies Reviewed:    Studies reviewed were summarized above. The additional studies were reviewed today:  Carotid artery ultrasound 10/27/2022: Summary:  Right Carotid: There was no evidence of thrombus, dissection,  atherosclerotic plaque or stenosis in the cervical carotid system.   Left Carotid: There was no evidence of thrombus, dissection,  atherosclerotic plaque or stenosis in the cervical carotid system.   Vertebrals:  Bilateral vertebral arteries demonstrate antegrade flow.  Subclavians: Normal flow hemodynamics were seen in bilateral subclavian arteries.  __________  2D echo 10/27/2022: 1. Left ventricular ejection fraction, by estimation, is 55 to 60%. The  left ventricle has normal function. The left ventricle has no regional  wall motion abnormalities. Left ventricular diastolic parameters were  normal.   2. Right ventricular systolic function is normal. The right ventricular  size is normal.   3. The mitral valve is normal in structure. No evidence of mitral valve  regurgitation. No evidence of mitral stenosis.   4. The aortic valve is normal in structure. Aortic valve regurgitation is  not visualized. No aortic stenosis is present.   5. The inferior vena cava is normal in size with greater than 50%  respiratory variability, suggesting right atrial pressure of 3 mmHg.    EKG:  EKG is ordered today.  The EKG ordered today demonstrates NSR, 71 bpm,  no acute ST-T changes  Recent Labs: 09/27/2022: ALT 16; BUN 9; Creatinine, Ser 0.50; Hemoglobin 13.2; Platelets 527; Potassium 4.2; Sodium 137  Recent Lipid Panel    Component Value Date/Time   CHOL 183 09/07/2020 0426   TRIG 143 09/07/2020 0426   HDL 36 (L) 09/07/2020 0426   CHOLHDL 5.1 09/07/2020 0426   VLDL 29 09/07/2020 0426   LDLCALC 118 (H) 09/07/2020 0426    PHYSICAL EXAM:    VS:  BP 112/70 (BP Location: Left Arm, Patient Position: Sitting, Cuff Size: Normal)   Pulse 71   Ht 5\' 2"  (1.575 m)   Wt 144 lb 12.8 oz (65.7 kg)  LMP 01/14/2009   SpO2 97%   BMI 26.48 kg/m   BMI: Body mass index is 26.48 kg/m.  Physical Exam Vitals reviewed.  Constitutional:      Appearance: She is well-developed.  HENT:     Head: Normocephalic and atraumatic.  Eyes:     General:        Right eye: No discharge.        Left eye: No discharge.  Neck:     Vascular: No JVD.  Cardiovascular:     Rate and Rhythm: Normal rate and regular rhythm.     Heart sounds: Normal heart sounds, S1 normal and S2 normal. Heart sounds not distant. No midsystolic click and no opening snap. No murmur heard.    No friction rub.  Pulmonary:     Effort: Pulmonary effort is normal. No respiratory distress.     Breath sounds: Normal breath sounds. No decreased breath sounds, wheezing or rales.  Chest:     Chest wall: No tenderness.  Abdominal:     General: There is no distension.  Musculoskeletal:     Cervical back: Normal range of motion.  Skin:    General: Skin is warm and dry.     Nails: There is no clubbing.  Neurological:     Mental Status: She is alert and oriented to person, place, and time.  Psychiatric:        Speech: Speech normal.        Behavior: Behavior normal.        Thought Content: Thought content normal.        Judgment: Judgment normal.     Wt Readings from Last 3 Encounters:  11/02/22 144 lb 12.8 oz (65.7 kg)  10/20/22 146 lb 8 oz (66.5 kg)  09/24/22 147 lb 3.2 oz (66.8 kg)      ASSESSMENT & PLAN:   Preprocedure cardiac risk stratification: Patient is scheduled to undergo EGD/colonoscopy on 11/10/2022.  Per Duke Activity Status Index, she can achieve > 4 METs without cardiac limitation.  Per RCRI, she is class I with an estimated rate of 3.9% for adverse cardiac event in the periprocedural timeframe.  Recent echo showed preserved LV systolic function.  She is without symptoms of angina or cardiac decompensation.  She may proceed with noncardiac procedure without further cardiac intervention.  Management of anticoagulation per PCP/vascular surgery.  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  Anticoagulation managed by PCP/vascular surgery and PCP.  Dizziness: Largely unchanged midodrine daily.  No significant structural abnormalities noted on echo.  Carotid artery ultrasound without evidence of hemodynamically significant stenosis in the bilateral ICAs, antegrade flow of the bilateral vertebral arteries, and normal flow hemodynamics in the bilateral subclavian arteries.  Blood pressure is stable in the office today.  Place Zio patch.  PAD/Aortic atherosclerosis/HLD/family history of CAD: LDL 118 in 08/2020.  On Lipitor 40 mg, managed by vascular surgery.  Obtain calcium score for further risk stratification.  Wegener's/pulmonary fibrosis: Followed by pulmonology.    Disposition: F/u with Dr. Mariah Milling or an APP in 6 months.   Medication Adjustments/Labs and Tests Ordered: Current medicines are reviewed at length with the patient today.  Concerns regarding medicines are outlined above. Medication changes, Labs and Tests ordered today are summarized above and listed in the Patient Instructions accessible in Encounters.   Elinor Dodge, PA-C 11/02/2022 12:39 PM  [facility] [address] [place], [place] [address] [phone]

## 2022-11-02 ENCOUNTER — Encounter: Payer: Self-pay | Admitting: Physician Assistant

## 2022-11-02 ENCOUNTER — Ambulatory Visit: Payer: Medicaid Other | Attending: Physician Assistant | Admitting: Physician Assistant

## 2022-11-02 ENCOUNTER — Ambulatory Visit: Payer: Medicaid Other

## 2022-11-02 VITALS — BP 112/70 | HR 71 | Ht 62.0 in | Wt 144.8 lb

## 2022-11-02 DIAGNOSIS — I7410 Embolism and thrombosis of unspecified parts of aorta: Secondary | ICD-10-CM | POA: Diagnosis not present

## 2022-11-02 DIAGNOSIS — Z8249 Family history of ischemic heart disease and other diseases of the circulatory system: Secondary | ICD-10-CM

## 2022-11-02 DIAGNOSIS — I739 Peripheral vascular disease, unspecified: Secondary | ICD-10-CM

## 2022-11-02 DIAGNOSIS — R55 Syncope and collapse: Secondary | ICD-10-CM

## 2022-11-02 DIAGNOSIS — E785 Hyperlipidemia, unspecified: Secondary | ICD-10-CM

## 2022-11-02 DIAGNOSIS — Z01810 Encounter for preprocedural cardiovascular examination: Secondary | ICD-10-CM | POA: Diagnosis not present

## 2022-11-02 DIAGNOSIS — I700 Atherosclerosis of aorta: Secondary | ICD-10-CM

## 2022-11-02 DIAGNOSIS — J8410 Pulmonary fibrosis, unspecified: Secondary | ICD-10-CM

## 2022-11-02 DIAGNOSIS — R42 Dizziness and giddiness: Secondary | ICD-10-CM

## 2022-11-02 NOTE — Patient Instructions (Signed)
Medication Instructions:  Your physician recommends that you continue on your current medications as directed. Please refer to the Current Medication list given to you today.   *If you need a refill on your cardiac medications before your next appointment, please call your pharmacy*   Lab Work: No labs ordered today    Testing/Procedures: CT coronary calcium score.   - $99 out of pocket cost at the time of your test - Call [phone] to schedule at your convenience.  [facility] [address] [place], [place] [address]   Coronary CalciumScan A coronary calcium scan is an imaging test used to look for deposits of calcium and other fatty materials (plaques) in the inner lining of the blood vessels of the heart (coronary arteries). These deposits of calcium and plaques can partly clog and narrow the coronary arteries without producing any symptoms or warning signs. This puts a person at risk for a heart attack. This test can detect these deposits before symptoms develop. Tell a health care provider about: Any allergies you have. All medicines you are taking, including vitamins, herbs, eye drops, creams, and over-the-counter medicines. Any problems you or family members have had with anesthetic medicines. Any blood disorders you have. Any surgeries you have had. Any medical conditions you have. Whether you are pregnant or may be pregnant. What are the risks? Generally, this is a safe procedure. However, problems may occur, including: Harm to a pregnant woman and her unborn baby. This test involves the use of radiation. Radiation exposure can be dangerous to a pregnant woman and her unborn baby. If you are pregnant, you generally should not have this procedure done. Slight increase in the risk of cancer. This is because of the radiation involved in the test. What happens before the procedure? No preparation is needed for this  procedure. What happens during the procedure? You will undress and remove any jewelry around your neck or chest. You will put on a hospital gown. Sticky electrodes will be placed on your chest. The electrodes will be connected to an electrocardiogram (ECG) machine to record a tracing of the electrical activity of your heart. A CT scanner will take pictures of your heart. During this time, you will be asked to lie still and hold your breath for 2-3 seconds while a picture of your heart is being taken. The procedure may vary among health care providers and hospitals. What happens after the procedure? You can get dressed. You can return to your normal activities. It is up to you to get the results of your test. Ask your health care provider, or the department that is doing the test, when your results will be ready. Summary A coronary calcium scan is an imaging test used to look for deposits of calcium and other fatty materials (plaques) in the inner lining of the blood vessels of the heart (coronary arteries). Generally, this is a safe procedure. Tell your health care provider if you are pregnant or may be pregnant. No preparation is needed for this procedure. A CT scanner will take pictures of your heart. You can return to your normal activities after the scan is done. This information is not intended to replace advice given to you by your health care provider. Make sure you discuss any questions you have with your health care provider. Document Released: 08/28/2007 Document Revised: 01/19/2016 Document Reviewed: 01/19/2016 Elsevier Interactive Patient Education  2017 [employer].  Your physician has recommended that you wear a  14 day Zio monitor.   This monitor is a medical device that records the heart's electrical activity. Doctors most often use these monitors to diagnose arrhythmias. Arrhythmias are problems with the speed or rhythm of the heartbeat. The monitor is a small device applied  to your chest. You can wear one while you do your normal daily activities. While wearing this monitor if you have any symptoms to push the button and record what you felt. Once you have worn this monitor for the period of time provider prescribed (Usually 14 days), you will return the monitor device in the postage paid box. Once it is returned they will download the data collected and provide [place] with a report which the provider will then review and we will call you with those results. Important tips:  Avoid showering during the first 24 hours of wearing the monitor. Avoid excessive sweating to help maximize wear time. Do not submerge the device, no hot tubs, and no swimming pools. Keep any lotions or oils away from the patch. After 24 hours you may shower with the patch on. Take brief showers with your back facing the shower head.  Do not remove patch once it has been placed because that will interrupt data and decrease adhesive wear time. Push the button when you have any symptoms and write down what you were feeling. Once you have completed wearing your monitor, remove and place into box which has postage paid and place in your outgoing mailbox.  If for some reason you have misplaced your box then call our office and we can provide another box and/or mail it off for you.   Follow-Up: At [facility], you and your health needs are our priority.  As part of our continuing mission to provide you with exceptional heart care, we have created designated Provider Care Teams.  These Care Teams include your primary Cardiologist (physician) and Advanced Practice Providers (APPs -  Physician Assistants and Nurse Practitioners) who all work together to provide you with the care you need, when you need it.  We recommend signing up for the patient portal called "MyChart".  Sign up information is provided on this After Visit Summary.  MyChart is used to connect with patients for Virtual Visits  (Telemedicine).  Patients are able to view lab/test results, encounter notes, upcoming appointments, etc.  Non-urgent messages can be sent to your provider as well.   To learn more about what you can do with MyChart, go to [email or web address].    Your next appointment:   6 month(s)  Provider:   You may see Julien Nordmann, MD or one of the following Advanced Practice Providers on your designated Care Team:   Eula Listen, [place]

## 2022-11-03 ENCOUNTER — Encounter: Payer: Self-pay | Admitting: Emergency Medicine

## 2022-11-03 ENCOUNTER — Encounter: Payer: Self-pay | Admitting: Gastroenterology

## 2022-11-05 ENCOUNTER — Ambulatory Visit: Payer: Self-pay

## 2022-11-05 NOTE — Telephone Encounter (Signed)
The guidelines are to stop at least 48 hours before, but no more than 5 days before procedure. If the procedure is on the 28th then the last dose of Eliquis should be the nighttime dose on Sunday the 25th. When to resume depends on whether or not any polyps are removed. If no polyps are removed then resume Eliquis in the morning on 8-29. If any polyps are removed then she'll need to ask the Gi when to resume.

## 2022-11-05 NOTE — Telephone Encounter (Signed)
Chief Complaint: Medication Question   Disposition: [] ED /[] Urgent Care (no appt availability in office) / [] Appointment(In office/virtual)/ []  [facility]/ [] Home Care/ [] Refused Recommended Disposition /[]  Mobile Bus/ [x]  Follow-up with PCP Additional Notes: Patient states she was told that she would need to stop taking her Eliquis on the evening of 11/09/22 and resume taking it on 8/28/4/24 after her colonoscopy and EGD by PCP. Patient also states tat the provider who will be doing the procedure advised her to stop taking the Eliquis 5 days before the procedure. Patient has not stopped taking the medication at this time and would like clarification. She is unable to get in contact with the provider who will be doing the procedure.  Summary: med questions   Pt called in , states is having procedure for colonoscopy and for her throat, she says she was told by Dr Sharol Harness to stop taking her blood thinner med the day before the procedure, and cont taking right after, but then she says she was tld by Dr that's actually doing the procedure to stop taking it 5 days before. Please call back to clarify     Reason for Disposition  [1] Caller has URGENT medicine question about med that PCP or specialist prescribed AND [2] triager unable to answer question  Answer Assessment - Initial Assessment Questions 1. NAME of MEDICINE: "What medicine(s) are you calling about?"     Eliquis BID  2. QUESTION: "What is your question?" (e.g., double dose of medicine, side effect)     I was told to stop taking the Eliquis 1 day before my procedure by Dr. Sharol Harness but the provider doing the procedure told me to stop taking it 5 days before the procedure. Which provider is correct?  3. PRESCRIBER: "Who prescribed the medicine?" Reason: if prescribed by specialist, call should be referred to that group.     Dr. Sharol HarnessRoxan Hockey 4. SYMPTOMS: "Do you have any symptoms?" If Yes, ask: "What symptoms are you  having?"  "How bad are the symptoms (e.g., mild, moderate, severe)     No  Protocols used: Medication Question Call-A-AH

## 2022-11-05 NOTE — Telephone Encounter (Signed)
Please Review for Dr. Quentin Cornwall

## 2022-11-05 NOTE — Telephone Encounter (Signed)
Patient advised. Verbalized understanding 

## 2022-11-09 ENCOUNTER — Encounter: Payer: Self-pay | Admitting: Gastroenterology

## 2022-11-09 ENCOUNTER — Ambulatory Visit: Payer: Medicaid Other

## 2022-11-10 ENCOUNTER — Encounter
Admission: RE | Disposition: A | Discharge status: Home / Self Care | Payer: Self-pay | Source: Home / Self Care | Attending: Gastroenterology

## 2022-11-10 ENCOUNTER — Ambulatory Visit: Payer: Medicaid Other | Admitting: Anesthesiology

## 2022-11-10 ENCOUNTER — Ambulatory Visit
Admission: RE | Admit: 2022-11-10 | Discharge: 2022-11-10 | Disposition: A | Discharge status: Home / Self Care | Payer: Medicaid Other | Attending: Gastroenterology | Admitting: Gastroenterology

## 2022-11-10 DIAGNOSIS — K3189 Other diseases of stomach and duodenum: Secondary | ICD-10-CM | POA: Insufficient documentation

## 2022-11-10 DIAGNOSIS — F32A Depression, unspecified: Secondary | ICD-10-CM | POA: Insufficient documentation

## 2022-11-10 DIAGNOSIS — I739 Peripheral vascular disease, unspecified: Secondary | ICD-10-CM | POA: Diagnosis not present

## 2022-11-10 DIAGNOSIS — Z7901 Long term (current) use of anticoagulants: Secondary | ICD-10-CM | POA: Insufficient documentation

## 2022-11-10 DIAGNOSIS — K2970 Gastritis, unspecified, without bleeding: Secondary | ICD-10-CM | POA: Insufficient documentation

## 2022-11-10 DIAGNOSIS — R112 Nausea with vomiting, unspecified: Secondary | ICD-10-CM

## 2022-11-10 DIAGNOSIS — F419 Anxiety disorder, unspecified: Secondary | ICD-10-CM | POA: Diagnosis not present

## 2022-11-10 DIAGNOSIS — K921 Melena: Secondary | ICD-10-CM | POA: Diagnosis present

## 2022-11-10 DIAGNOSIS — F17210 Nicotine dependence, cigarettes, uncomplicated: Secondary | ICD-10-CM | POA: Insufficient documentation

## 2022-11-10 HISTORY — PX: ESOPHAGOGASTRODUODENOSCOPY (EGD) WITH PROPOFOL: SHX5813

## 2022-11-10 HISTORY — PX: COLONOSCOPY WITH PROPOFOL: SHX5780

## 2022-11-10 HISTORY — PX: BIOPSY: SHX5522

## 2022-11-10 SURGERY — ESOPHAGOGASTRODUODENOSCOPY (EGD) WITH PROPOFOL
Anesthesia: General

## 2022-11-10 MED ORDER — PROPOFOL 10 MG/ML IV BOLUS
INTRAVENOUS | Status: DC | PRN
  Administered 2022-11-10 (×3): 20 mg via INTRAVENOUS
  Administered 2022-11-10: 50 mg via INTRAVENOUS
  Administered 2022-11-10: 20 mg via INTRAVENOUS
  Administered 2022-11-10: 40 mg via INTRAVENOUS
  Administered 2022-11-10 (×2): 20 mg via INTRAVENOUS
  Administered 2022-11-10: 40 mg via INTRAVENOUS
  Administered 2022-11-10: 20 mg via INTRAVENOUS

## 2022-11-10 MED ORDER — SODIUM CHLORIDE 0.9 % IV SOLN
INTRAVENOUS | Status: DC
  Administered 2022-11-10: 20 mL/h via INTRAVENOUS

## 2022-11-10 MED ORDER — LIDOCAINE HCL (PF) 2 % IJ SOLN
INTRAMUSCULAR | Status: DC | PRN
  Administered 2022-11-10: 20 mg via INTRADERMAL

## 2022-11-10 NOTE — Anesthesia Preprocedure Evaluation (Addendum)
Anesthesia Evaluation  Patient identified by MRN, date of birth, ID band Patient awake    Reviewed: Allergy & Precautions, NPO status , Patient's Chart, lab work & pertinent test results  History of Anesthesia Complications Negative for: history of anesthetic complications  Airway Mallampati: I   Neck ROM: Full    Dental no notable dental hx.    Pulmonary Current Smoker (less than 1 ppd)Patient did not abstain from smoking. Pulmonary fibrosis   Pulmonary exam normal breath sounds clear to auscultation       Cardiovascular + Peripheral Vascular Disease (on Eliquis)  Normal cardiovascular exam Rhythm:Regular Rate:Normal  ECG 11/02/22: NSR, 71 bpm, no acute ST-T changes  Echo 10/27/22:  1. Left ventricular ejection fraction, by estimation, is 55 to 60%. The left ventricle has normal function. The left ventricle has no regional wall motion abnormalities. Left ventricular diastolic parameters were normal.   2. Right ventricular systolic function is normal. The right ventricular size is normal.   3. The mitral valve is normal in structure. No evidence of mitral valve regurgitation. No evidence of mitral stenosis.   4. The aortic valve is normal in structure. Aortic valve regurgitation is not visualized. No aortic stenosis is present.   5. The inferior vena cava is normal in size with greater than 50% respiratory variability, suggesting right atrial pressure of 3 mmHg    Neuro/Psych  PSYCHIATRIC DISORDERS Anxiety Depression    negative neurological ROS     GI/Hepatic ,GERD  ,,  Endo/Other  negative endocrine ROS    Renal/GU negative Renal ROS     Musculoskeletal   Abdominal   Peds  Hematology negative hematology ROS (+)   Anesthesia Other Findings Cardiology note 11/02/22:  1. Preprocedure cardiac risk stratification: Patient is scheduled to undergo EGD/colonoscopy on 11/10/2022.  Per [facility] Activity Status Index, she can  achieve > 4 METs without cardiac limitation.  Per RCRI, she is class I with an estimated rate of 3.9% for adverse cardiac event in the periprocedural timeframe.  Recent echo showed preserved LV systolic function.  She is without symptoms of angina or cardiac decompensation.  She may proceed with noncardiac procedure without further cardiac intervention.  Management of anticoagulation per PCP/vascular surgery.   2. 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  Anticoagulation managed by PCP/vascular surgery and PCP.   3. Dizziness: Largely unchanged midodrine daily.  No significant structural abnormalities noted on echo.  Carotid artery ultrasound without evidence of hemodynamically significant stenosis in the bilateral ICAs, antegrade flow of the bilateral vertebral arteries, and normal flow hemodynamics in the bilateral subclavian arteries.  Blood pressure is stable in the office today.  Place Zio patch.   4. PAD/Aortic atherosclerosis/HLD/family history of CAD: LDL 118 in 08/2020.  On Lipitor 40 mg, managed by vascular surgery.  Obtain calcium score for further risk stratification.   5. Wegener's/pulmonary fibrosis: Followed by pulmonology.    Disposition: F/u with Dr. Mariah Milling or an APP in 6 months.  Reproductive/Obstetrics                             Anesthesia Physical Anesthesia Plan  ASA: 3  Anesthesia Plan: General   Post-op Pain Management:    Induction: Intravenous  PONV Risk Score and Plan: 2 and Propofol infusion, TIVA and Treatment may vary due to age or medical condition  Airway Management Planned: Natural Airway  Additional Equipment:   Intra-op Plan:   Post-operative Plan:   Informed Consent: I have reviewed the patients History and Physical, chart, labs and discussed the procedure including the risks,  benefits and alternatives for the proposed anesthesia with the patient or authorized representative who has indicated his/her understanding and acceptance.       Plan Discussed with: CRNA  Anesthesia Plan Comments: (LMA/GETA backup discussed.  Patient consented for risks of anesthesia including but not limited to:  - adverse reactions to medications - damage to eyes, teeth, lips or other oral mucosa - nerve damage due to positioning  - sore throat or hoarseness - damage to heart, brain, nerves, lungs, other parts of body or loss of life  Informed patient about role of CRNA in peri- and intra-operative care.  Patient voiced understanding.)        Anesthesia Quick Evaluation

## 2022-11-10 NOTE — H&P (Signed)
Wyline Mood, MD [address], [address], [place], [place], [address] [address], [address], [place], [place], [address] Phone: [phone]  Fax: [phone]  Primary Care Physician:  Ronnald Ramp, MD   Pre-Procedure History & Physical: HPI:  Alexandra [name] is a 52 y.o. 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)     Past Surgical History:  Procedure Laterality Date   ABDOMINAL HYSTERECTOMY     AMPUTATION Right 09/12/2020   Procedure: AMPUTATION BELOW KNEE;  Surgeon: Annice Needy, MD;  Location: [facility] ORS;  Service: Vascular;  Laterality: Right;   BREAST CYST EXCISION Left 2006   ENDOVASCULAR STENT INSERTION N/A 09/08/2020   Procedure: ENDOVASCULAR STENT GRAFT INSERTION;  Surgeon: Annice Needy, MD;  Location: [facility] ORS;  Service: Vascular;  Laterality: N/A;   LOWER EXTREMITY ANGIOGRAPHY Right 09/01/2020   Procedure: Lower Extremity Angiography;  Surgeon: Annice Needy, MD;  Location: [facility] IN[facility];  Service: Cardiovascular;  Laterality: Right;   LOWER EXTREMITY ANGIOGRAPHY Left 10/01/2020   Procedure: LOWER EXTREMITY ANGIOGRAPHY;  Surgeon: Annice Needy, MD;  Location: [facility] CV [facility];  Service: Cardiovascular;  Laterality: Left;   LOWER EXTREMITY INTERVENTION Right 09/07/2020   Procedure: LOWER EXTREMITY INTERVENTION;  Surgeon: Nada Libman, MD;  Location: [facility] CV [facility];  Service: Cardiovascular;  Laterality: Right;    Prior to Admission medications   Medication Sig Start Date End Date Taking? Authorizing Provider  apixaban (ELIQUIS) 5 MG TABS tablet Take 1 tablet (5 mg total) by mouth 2 (two) times daily. 09/29/20   Georgiana Spinner, NP  aspirin EC 81  MG EC tablet Take 1 tablet (81 mg total) by mouth daily. Swallow whole. 10/02/20   Stegmayer, Ranae Plumber, PA-C  atorvastatin (LIPITOR) 40 MG tablet Take 1 tablet by mouth once daily 04/08/22   Georgiana Spinner, NP  calcium carbonate (OSCAL) 1500 (600 Ca) MG TABS tablet Take 600 mg of elemental calcium by mouth 2 (two) times daily with a meal.    [provider]  citalopram (CELEXA) 40 MG tablet Take 1 tablet (40 mg total) by mouth daily. 09/09/22   [name], PA-C  EPINEPHrine 0.3 mg/0.3 mL IJ SOAJ injection Inject 0.3 mg into the muscle as needed for anaphylaxis. Follow package instructions as needed for severe allergy or anaphylactic reaction. 03/19/22   Sharman Cheek, MD  ferrous gluconate (FERGON) 324 MG tablet Take 1 tablet (324 mg total) by mouth 2 (two) times daily with a meal. 09/16/20   [name], MD  folic acid (FOLVITE) 400 MCG tablet Take 400 mcg by mouth daily.    [provider]  gabapentin (NEURONTIN) 300 MG capsule Take 1 capsule (300 mg total) by mouth 3 (three) times daily. 09/09/22   [name], PA-C  GAVILYTE-G 236 g solution Take 4,000 mLs by mouth as directed. Patient not taking: Reported on 11/02/2022 09/27/22   [provider]  HYDROcodone-acetaminophen (NORCO) 5-325 MG tablet Take 1 tablet by mouth every 8 (eight) hours as needed for moderate pain. 10/20/22   Simmons-Robinson, Tawanna Cooler, MD  linaclotide (LINZESS) 290 MCG CAPS capsule Take  1 capsule (290 mcg total) by mouth daily before breakfast. 09/24/22 03/23/23  Celso Amy, PA-C  lubiprostone (AMITIZA) 24 MCG capsule 24 mcg. 07/20/22   [provider]  Magnesium Citrate 100 MG CAPS Take by mouth.    [provider]  methotrexate (RHEUMATREX) 10 MG tablet Take 60 mg by mouth once a week. Caution: Chemotherapy. Protect from light.    [provider]  midodrine (PROAMATINE) 5 MG tablet Take 1 tablet (5 mg total) by mouth 3 (three) times daily with meals. 09/09/22   Antonieta Iba, MD  nicotine (NICODERM CQ - DOSED IN MG/24 HOURS) 21 mg/24hr patch Place 1 patch (21 mg total) onto the skin daily. Patient not taking: Reported on 11/02/2022 09/16/20   [name], MD  ondansetron (ZOFRAN) 4 MG tablet Take 1 tablet (4 mg total) by mouth 2 (two) times daily. Patient not taking: Reported on 11/02/2022 09/24/22 12/23/22  Celso Amy, PA-C  ondansetron (ZOFRAN-ODT) 4 MG disintegrating tablet Take 1 tablet (4 mg total) by mouth every 8 (eight) hours as needed for nausea or vomiting. 10/20/22   Simmons-Robinson, Makiera, MD  pantoprazole (PROTONIX) 40 MG tablet Take 40 mg by mouth daily. BID    [provider]  Pirfenidone 267 MG CAPS Take 1 capsule by mouth in the morning, at noon, and at bedtime.    [provider]  predniSONE (DELTASONE) 2.5 MG tablet Take 2.5 mg by mouth daily. 07/24/22   [provider]  traMADol (ULTRAM) 50 MG tablet Take 1 tablet (50 mg total) by mouth every 6 (six) hours as needed. Patient not taking: Reported on 11/02/2022 09/27/22 09/27/23  Jene Every, MD  VRAYLAR 1.5 MG capsule Take 1.5 mg by mouth daily. 08/19/22   [provider]  albuterol (PROVENTIL HFA;VENTOLIN HFA) 108 (90 Base) MCG/ACT inhaler Inhale 2 puffs into the lungs every 6 (six) hours as needed. 03/26/17 12/20/19  Rebecka Apley, MD    Allergies as of 09/24/2022   (No Known Allergies)    Family History  Problem Relation Age of Onset   Breast cancer Mother 86   Congestive Heart Failure Father    Diabetes Brother    Breast cancer Maternal Aunt    Breast cancer Maternal Aunt        Maternal Great Aunt    Diabetes Paternal Aunt    Breast cancer Maternal Grandmother    Diabetes Maternal Grandfather    Breast cancer Cousin 34    Social History   Socioeconomic History   Marital status: Divorced    Spouse name: Not on file   Number of children: Not on file   Years of education: Not on file   Highest education level: Not on file   Occupational History   Not on file  Tobacco Use   Smoking status: Some Days    Current packs/day: 1.00    Average packs/day: 1 pack/day for 29.0 years (29.0 ttl pk-yrs)    Types: Cigarettes   Smokeless tobacco: Never   Tobacco comments:    Smokes 0.5 PPD 09/07/2022  Vaping Use   Vaping status: Never Used  Substance and Sexual Activity   Alcohol use: Not Currently    Comment: social   Drug use: No   Sexual activity: Yes  Other Topics Concern   Not on file  Social History Narrative   Not on file   Social Determinants of Health   Financial Resource Strain: Not on file  Food Insecurity: Not on file  Transportation Needs: Not on file  Physical Activity: Not on file  Stress: Not on file  Social Connections: Not on file  Intimate Partner Violence: Not on file    Review of Systems: See HPI, otherwise negative ROS  Physical Exam: LMP 01/14/2009  General:   Alert,  pleasant and cooperative in NAD Head:  Normocephalic and atraumatic. Neck:  Supple; no masses or thyromegaly. Lungs:  Clear throughout to auscultation, normal respiratory effort.    Heart:  +S1, +S2, Regular rate and rhythm, No edema. Abdomen:  Soft, nontender and nondistended. Normal bowel sounds, without guarding, and without rebound.   Neurologic:  Alert and  oriented x4;  grossly normal neurologically.  Impression/Plan: Alexandra [name] is here for an endoscopy and colonoscopy  to be performed for  evaluation of melena    Risks, benefits, limitations, and alternatives regarding endoscopy have been reviewed with the patient.  Questions have been answered.  All parties agreeable.   Wyline Mood, MD  11/10/2022, 11:26 AM

## 2022-11-10 NOTE — Anesthesia Postprocedure Evaluation (Signed)
Anesthesia Post Note  Patient: MICHEALLE CAMELO  Procedure(s) Performed: ESOPHAGOGASTRODUODENOSCOPY (EGD) WITH PROPOFOL COLONOSCOPY WITH PROPOFOL BIOPSY  Patient location during evaluation: PACU Anesthesia Type: General Level of consciousness: awake and alert, oriented and patient cooperative Pain management: pain level controlled Vital Signs Assessment: post-procedure vital signs reviewed and stable Respiratory status: spontaneous breathing, nonlabored ventilation and respiratory function stable Cardiovascular status: blood pressure returned to baseline and stable Postop Assessment: adequate PO intake Anesthetic complications: no   No notable events documented.   Last Vitals:  Vitals:   11/10/22 1235 11/10/22 1245  BP: 105/61 109/64  Pulse: 66 62  Resp: 18 18  Temp:    SpO2: 97% 100%    Last Pain:  Vitals:   11/10/22 1235  TempSrc:   PainSc: 0-No pain                 Reed Breech

## 2022-11-10 NOTE — Transfer of Care (Signed)
Immediate Anesthesia Transfer of Care Note  Patient: Alexandra [name]  Procedure(s) Performed: ESOPHAGOGASTRODUODENOSCOPY (EGD) WITH PROPOFOL COLONOSCOPY WITH PROPOFOL BIOPSY  Patient Location: PACU and Endoscopy Unit  Anesthesia Type:MAC  Level of Consciousness: drowsy  Airway & Oxygen Therapy: Patient Spontanous Breathing and Patient connected to nasal cannula oxygen  Post-op Assessment: Patient and vitals stable   Post vital signs: Reviewed and stable  Last Vitals:  Vitals Value Taken Time  BP 89/46 11/10/22 1225  Temp    Pulse 79 11/10/22 1224  Resp 18 11/10/22 1225  SpO2 99 % 11/10/22 1224  Vitals shown include unfiled device data.  Last Pain:  Vitals:   11/10/22 1140  TempSrc: Temporal  PainSc: 0-No pain         Complications: No notable events documented.

## 2022-11-10 NOTE — Op Note (Signed)
[facility] Gastroenterology Patient Name: Alexandra [name] Procedure Date: 11/10/2022 11:48 AM MRN: [id number] Account #: [id number] Date of Birth: [birth year] Admit Type: Outpatient Age: 52 Room: [facility] ENDO ROOM 3 Gender: Female Note Status: Finalized Instrument Name: Prentice Docker [id number] Procedure:             Colonoscopy Indications:           Melena Providers:             Wyline Mood MD, MD Medicines:             Monitored Anesthesia Care Complications:         No immediate complications. Procedure:             Pre-Anesthesia Assessment:                        - Prior to the procedure, a History and Physical was                         performed, and patient medications, allergies and                         sensitivities were reviewed. The patient's tolerance                         of previous anesthesia was reviewed.                        - The risks and benefits of the procedure and the                         sedation options and risks were discussed with the                         patient. All questions were answered and informed                         consent was obtained.                        - ASA Grade Assessment: II - A patient with mild                         systemic disease.                        After obtaining informed consent, the colonoscope was                         passed under direct vision. Throughout the procedure,                         the patient's blood pressure, pulse, and oxygen                         saturations were monitored continuously. The                         Colonoscope was introduced through the anus and  advanced to the the cecum, identified by the                         appendiceal orifice. The colonoscopy was performed                         with ease. The patient tolerated the procedure well.                         The quality of the bowel preparation was good. The                          ileocecal valve, appendiceal orifice, and rectum were                         photographed. Findings:      The entire examined colon appeared normal on direct and retroflexion       views. Impression:            - The entire examined colon is normal on direct and                         retroflexion views.                        - No specimens collected. Recommendation:        - Discharge patient to home (with escort).                        - Resume previous diet.                        - Continue present medications.                        - Return to GI office as previously scheduled. Procedure Code(s):     --- Professional ---                        [id number], Colonoscopy, flexible; diagnostic, including                         collection of specimen(s) by brushing or washing, when                         performed (separate procedure) Diagnosis Code(s):     --- Professional ---                        K92.1, Melena (includes Hematochezia) CPT copyright 2022 American Medical Association. All rights reserved. The codes documented in this report are preliminary and upon coder review may  be revised to meet current compliance requirements. Wyline Mood, MD Wyline Mood MD, MD 11/10/2022 12:21:09 PM This report has been signed electronically. Number of Addenda: 0 Note Initiated On: 11/10/2022 11:48 AM Scope Withdrawal Time: 0 hours 7 minutes 56 seconds  Total Procedure Duration: 0 hours 11 minutes 36 seconds  Estimated Blood Loss:  Estimated blood loss: none.      [facility]

## 2022-11-10 NOTE — Op Note (Signed)
[facility] Gastroenterology Patient Name: Alexandra [name] Procedure Date: 11/10/2022 11:49 AM MRN: [id number] Account #: [id number] Date of Birth: May 29, 1970 Admit Type: Outpatient Age: 52 Room: [facility] ENDO ROOM 3 Gender: Female Note Status: Finalized Instrument Name: Patton Salles Endoscope [id number] Procedure:             Upper GI endoscopy Indications:           Melena Providers:             Wyline Mood MD, MD Medicines:             Monitored Anesthesia Care Complications:         No immediate complications. Procedure:             Pre-Anesthesia Assessment:                        - Prior to the procedure, a History and Physical was                         performed, and patient medications, allergies and                         sensitivities were reviewed. The patient's tolerance                         of previous anesthesia was reviewed.                        - The risks and benefits of the procedure and the                         sedation options and risks were discussed with the                         patient. All questions were answered and informed                         consent was obtained.                        - ASA Grade Assessment: II - A patient with mild                         systemic disease.                        After obtaining informed consent, the endoscope was                         passed under direct vision. Throughout the procedure,                         the patient's blood pressure, pulse, and oxygen                         saturations were monitored continuously. The Endoscope                         was introduced through the mouth, and advanced to the  third part of duodenum. The upper GI endoscopy was                         accomplished with ease. The patient tolerated the                         procedure well. Findings:      The examined duodenum was normal.      The esophagus was normal.      Diffuse  moderate inflammation characterized by congestion (edema),       erythema and aphthous ulcerations was found on the greater curvature of       the stomach. Biopsies were taken with a cold forceps for histology.      The cardia and gastric fundus were normal on retroflexion. Impression:            - Normal examined duodenum.                        - Normal esophagus.                        - Gastritis. Biopsied. Recommendation:        - Use Protonix (pantoprazole) 40 mg PO daily for 3                         months.                        - Await pathology results.                        - Repeat upper endoscopy in 2 months to evaluate the                         response to therapy. Procedure Code(s):     --- Professional ---                        [phone], Esophagogastroduodenoscopy, flexible,                         transoral; with biopsy, single or multiple Diagnosis Code(s):     --- Professional ---                        K29.70, Gastritis, unspecified, without bleeding                        K92.1, Melena (includes Hematochezia) CPT copyright 2022 American Medical Association. All rights reserved. The codes documented in this report are preliminary and upon coder review may  be revised to meet current compliance requirements. Wyline Mood, MD Wyline Mood MD, MD 11/10/2022 12:07:40 PM This report has been signed electronically. Number of Addenda: 0 Note Initiated On: 11/10/2022 11:49 AM Estimated Blood Loss:  Estimated blood loss: none.      [facility]

## 2022-11-11 ENCOUNTER — Encounter: Payer: Self-pay | Admitting: Gastroenterology

## 2022-11-12 ENCOUNTER — Telehealth: Payer: Self-pay | Admitting: Gastroenterology

## 2022-11-12 ENCOUNTER — Other Ambulatory Visit: Payer: Self-pay

## 2022-11-12 DIAGNOSIS — R112 Nausea with vomiting, unspecified: Secondary | ICD-10-CM

## 2022-11-12 MED ORDER — PANTOPRAZOLE SODIUM 40 MG PO TBEC: 40.0000 mg | DELAYED_RELEASE_TABLET | Freq: Every day | ORAL | 3 refills | Status: DC

## 2022-11-12 NOTE — Telephone Encounter (Signed)
Called patient but had to leave her a voicemail letting her know that she is to take Pantoprazole 40 MG daily once a day. Prescription was sent to her pharmacy.

## 2022-11-12 NOTE — Telephone Encounter (Signed)
Patient called in about her medication she stated that she sent a message.

## 2022-11-12 NOTE — Telephone Encounter (Signed)
Called patient back and she stated that she was told at discharge that she was needing to take Pantoprazole 40 MG once a day and was never called in. However, in her chart under medications it stated Pantoprazole 40 MG BID. Would you be able to review and let me know if it's once or twice a day. Patient also scheduled her repeat EGD in 2 months. Please advise.

## 2022-11-12 NOTE — Addendum Note (Signed)
Addended by: Adela Ports on: 11/12/2022 11:38 AM   Modules accepted: Orders

## 2022-11-16 ENCOUNTER — Encounter: Payer: Self-pay | Admitting: Student

## 2022-11-16 ENCOUNTER — Ambulatory Visit: Payer: Medicaid Other

## 2022-11-22 NOTE — Progress Notes (Unsigned)
Celso Amy, PA-C [address]  [address]  [place], [place] [address]  Main: [phone]  Fax: [phone]   Primary Care Physician: Ronnald Ramp, MD  Primary Gastroenterologist:  Celso Amy, PA-C / Dr. Wyline Mood    CC: F/U Nausea, Vomiting, Gastritis, and Chronic Constipation  HPI: Alexandra [name] is a 52 y.o. female returns for followup of erosive gastritis and chronic constipation.  She is currently taking pantoprazole 40 Mg once daily and Linzess 290 mcg once daily with benefit.  She has no current upper abdominal pain.  Currently having bowel movement twice per week.  Overall her GI symptoms have improved.  She has no new GI concerns today.  EGD 11/10/2022 by Dr. Tobi Bastos showed moderate gastritis.  Normal esophagus and duodenum.  Gastric biopsies showed reactive chemical gastropathy.  Negative for H. pylori.  She was started on Protonix 40 mg daily for 3 months.  Repeat EGD in 2 months to evaluate response to treatment.  Denies NSAID Use.  Colonoscopy 11/10/22: Normal, good prep.  No biopsies.  No polyps. 10 year repeat.  PMH: CAD, Hx Aortic Thrombus, On Aspirin and Eliquis, RA, Pulmonary Fibrosis, Wegener's granulomatosis, on Prednisone and Methotrexate, Smoker, Emphysema.  Abdominal pelvic CT 05/2022 showed constipation, otherwise unrevealing.   Labs 09/27/2022: Normal CBC, CMP, and lipase; except elevated platelets 527.  Normal hemoglobin 13.2.  Current Outpatient Medications  Medication Sig Dispense Refill   apixaban (ELIQUIS) 5 MG TABS tablet Take 1 tablet (5 mg total) by mouth 2 (two) times daily. 60 tablet 4   aspirin EC 81 MG EC tablet Take 1 tablet (81 mg total) by mouth daily. Swallow whole. 90 tablet 3   atorvastatin (LIPITOR) 40 MG tablet Take 1 tablet by mouth once daily 90 tablet 0   calcium carbonate (OSCAL) 1500 (600 Ca) MG TABS tablet Take 600 mg of elemental calcium by mouth 2 (two) times daily with a meal.     citalopram (CELEXA) 40 MG  tablet Take 1 tablet (40 mg total) by mouth daily. 90 tablet 3   EPINEPHrine 0.3 mg/0.3 mL IJ SOAJ injection Inject 0.3 mg into the muscle as needed for anaphylaxis. Follow package instructions as needed for severe allergy or anaphylactic reaction. 2 each 3   ferrous gluconate (FERGON) 324 MG tablet Take 1 tablet (324 mg total) by mouth 2 (two) times daily with a meal. 60 tablet 3   folic acid (FOLVITE) 400 MCG tablet Take 400 mcg by mouth daily.     gabapentin (NEURONTIN) 300 MG capsule Take 1 capsule (300 mg total) by mouth 3 (three) times daily. 270 capsule 1   HYDROcodone-acetaminophen (NORCO) 5-325 MG tablet Take 1 tablet by mouth every 8 (eight) hours as needed for moderate pain. 15 tablet 0   Magnesium Citrate 100 MG CAPS Take by mouth.     methotrexate (RHEUMATREX) 10 MG tablet Take 60 mg by mouth once a week. Caution: Chemotherapy. Protect from light.     midodrine (PROAMATINE) 5 MG tablet Take 1 tablet (5 mg total) by mouth 3 (three) times daily with meals. 90 tablet 3   nicotine (NICODERM CQ - DOSED IN MG/24 HOURS) 21 mg/24hr patch Place 1 patch (21 mg total) onto the skin daily. 28 patch 0   ondansetron (ZOFRAN) 4 MG tablet Take 1 tablet (4 mg total) by mouth 2 (two) times daily. 60 tablet 2   ondansetron (ZOFRAN-ODT) 4 MG disintegrating tablet Take 1 tablet (4 mg total) by mouth every 8 (eight)  hours as needed for nausea or vomiting. 24 tablet 0   Pirfenidone 267 MG CAPS Take 1 capsule by mouth in the morning, at noon, and at bedtime.     predniSONE (DELTASONE) 2.5 MG tablet Take 2.5 mg by mouth daily.     linaclotide (LINZESS) 290 MCG CAPS capsule Take 1 capsule (290 mcg total) by mouth daily before breakfast. 90 capsule 3   pantoprazole (PROTONIX) 40 MG tablet Take 1 tablet (40 mg total) by mouth daily. 90 tablet 3   No current facility-administered medications for this visit.    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)     Past Surgical History:  Procedure Laterality Date   ABDOMINAL HYSTERECTOMY     AMPUTATION Right 09/12/2020   Procedure: AMPUTATION BELOW KNEE;  Surgeon: Annice Needy, MD;  Location: [facility] ORS;  Service: Vascular;  Laterality: Right;   BIOPSY  11/10/2022   Procedure: BIOPSY;  Surgeon: Wyline Mood, MD;  Location: [facility] ENDOSCOPY;  Service: Gastroenterology;;   BREAST CYST EXCISION Left 2006   COLONOSCOPY WITH PROPOFOL N/A 11/10/2022   Procedure: COLONOSCOPY WITH PROPOFOL;  Surgeon: Wyline Mood, MD;  Location: [facility] ENDOSCOPY;  Service: Gastroenterology;  Laterality: N/A;   ENDOVASCULAR STENT INSERTION N/A 09/08/2020   Procedure: ENDOVASCULAR STENT GRAFT INSERTION;  Surgeon: Annice Needy, MD;  Location: [facility] ORS;  Service: Vascular;  Laterality: N/A;   ESOPHAGOGASTRODUODENOSCOPY (EGD) WITH PROPOFOL N/A 11/10/2022   Procedure: ESOPHAGOGASTRODUODENOSCOPY (EGD) WITH PROPOFOL;  Surgeon: Wyline Mood, MD;  Location: [facility] ENDOSCOPY;  Service: Gastroenterology;  Laterality: N/A;   LOWER EXTREMITY ANGIOGRAPHY Right 09/01/2020   Procedure: Lower Extremity Angiography;  Surgeon: Annice Needy, MD;  Location: [facility] INVASIVE CV LAB;  Service: Cardiovascular;  Laterality: Right;   LOWER EXTREMITY ANGIOGRAPHY Left 10/01/2020   Procedure: LOWER EXTREMITY ANGIOGRAPHY;  Surgeon: Annice Needy, MD;  Location: [facility] INVASIVE CV LAB;  Service: Cardiovascular;  Laterality: Left;   LOWER EXTREMITY INTERVENTION Right 09/07/2020   Procedure: LOWER EXTREMITY INTERVENTION;  Surgeon: Nada Libman, MD;  Location: [facility] INVASIVE CV LAB;  Service: Cardiovascular;  Laterality: Right;    Review of Systems:    All systems reviewed and negative except where noted in HPI.   Physical  Examination:   BP 97/63   Pulse 69   Temp 98.2 F (36.8 C)   Ht 5\' 2"  (1.575 m)   Wt 147 lb 6.4 oz (66.9 kg)   LMP 01/14/2009   BMI 26.96 kg/m   General: Well-nourished, well-developed in no acute distress.  Neuro: Alert and oriented x 3.  Walks with a Cane. Psych: Alert and cooperative, normal mood and affect. No Exam Performed.  Imaging Studies: See HPI.  Assessment and Plan:   Alexandra [name] is a 52 y.o. y/o female returns for follow-up of chronic erosive gastritis and chronic constipation.  Improved on current treatment.  Recent EGD showed biopsies negative for H. pylori.  Recent colonoscopy was normal.  1.  Chronic gastritis: Symptoms currently improved on pantoprazole 40 Mg daily  Schedule repeat EGD to verify healing on PPI per Dr. Tobi Bastos  EGD is scheduled for 01/11/2023  Continue pantoprazole 40 Mg 1 tablet once daily  Continue to avoid NSAIDs.  Okay to take Tylenol if needed.  2.  Chronic Idiopathic constipation -improved  Continue Linzess 290 mcg once daily  Continue high-fiber diet and 64 ounces of water daily.   Celso Amy, PA-C  Follow up in 1 year or sooner if worsening GI symptoms.

## 2022-11-23 ENCOUNTER — Encounter: Payer: Self-pay | Admitting: Physician Assistant

## 2022-11-23 ENCOUNTER — Ambulatory Visit (INDEPENDENT_AMBULATORY_CARE_PROVIDER_SITE_OTHER): Payer: Medicaid Other | Admitting: Physician Assistant

## 2022-11-23 VITALS — BP 97/63 | HR 69 | Temp 98.2°F | Ht 62.0 in | Wt 147.4 lb

## 2022-11-23 DIAGNOSIS — K5904 Chronic idiopathic constipation: Secondary | ICD-10-CM

## 2022-11-23 DIAGNOSIS — K2960 Other gastritis without bleeding: Secondary | ICD-10-CM

## 2022-11-23 MED ORDER — PANTOPRAZOLE SODIUM 40 MG PO TBEC: 40.0000 mg | DELAYED_RELEASE_TABLET | Freq: Every day | ORAL | 3 refills | Status: DC

## 2022-11-23 MED ORDER — LINACLOTIDE 290 MCG PO CAPS: 290.0000 ug | ORAL_CAPSULE | Freq: Every day | ORAL | 3 refills | Status: AC

## 2022-11-24 ENCOUNTER — Ambulatory Visit: Payer: Medicaid Other | Admitting: Family Medicine

## 2022-11-24 ENCOUNTER — Encounter: Payer: Self-pay | Admitting: Family Medicine

## 2022-11-24 ENCOUNTER — Other Ambulatory Visit: Payer: Self-pay | Admitting: Physician Assistant

## 2022-11-24 ENCOUNTER — Ambulatory Visit
Admission: RE | Admit: 2022-11-24 | Discharge: 2022-11-24 | Disposition: A | Discharge status: Home / Self Care | Payer: Medicaid Other | Source: Ambulatory Visit | Attending: Student | Admitting: Student

## 2022-11-24 VITALS — BP 115/60 | HR 66 | Temp 98.6°F | Ht 62.0 in | Wt 148.0 lb

## 2022-11-24 DIAGNOSIS — R519 Headache, unspecified: Secondary | ICD-10-CM

## 2022-11-24 DIAGNOSIS — R4189 Other symptoms and signs involving cognitive functions and awareness: Secondary | ICD-10-CM

## 2022-11-24 DIAGNOSIS — Z720 Tobacco use: Secondary | ICD-10-CM

## 2022-11-24 DIAGNOSIS — Z23 Encounter for immunization: Secondary | ICD-10-CM | POA: Diagnosis not present

## 2022-11-24 DIAGNOSIS — K2960 Other gastritis without bleeding: Secondary | ICD-10-CM

## 2022-11-24 DIAGNOSIS — R110 Nausea: Secondary | ICD-10-CM | POA: Insufficient documentation

## 2022-11-24 MED ORDER — GADOPICLENOL 0.5 MMOL/ML IV SOLN
7.5000 mL | Freq: Once | INTRAVENOUS | Status: AC | PRN
  Administered 2022-11-24: 7.5 mL via INTRAVENOUS

## 2022-11-24 MED ORDER — ONDANSETRON 4 MG PO TBDP
4.0000 mg | ORAL_TABLET | Freq: Three times a day (TID) | ORAL | 1 refills | Status: DC | PRN
Start: 2022-11-24 — End: 2023-11-04

## 2022-11-24 MED ORDER — PANTOPRAZOLE SODIUM 40 MG PO TBEC
40.0000 mg | DELAYED_RELEASE_TABLET | Freq: Two times a day (BID) | ORAL | 2 refills | Status: DC
Start: 2022-11-24 — End: 2024-01-04

## 2022-11-24 NOTE — Progress Notes (Signed)
Established patient visit   Patient: Alexandra [name]   DOB: June 07, 1970   52 y.o. Female  MRN: [id number] Visit Date: 11/24/2022  Today's healthcare provider: Ronnald Ramp, MD   Chief Complaint  Patient presents with   Nausea   Subjective       Discussed the use of AI scribe software for clinical note transcription with the patient, who gave verbal consent to proceed.  History of Present Illness   The patient, with a history of gastropathy, pulmonary fibrosis, and a recent leg amputation, presents with ongoing gastrointestinal issues. She reports persistent nausea, requiring Zofran 2-3 times daily. She denies vomiting. The nausea is particularly bothersome later at night. She has been on Protonix since May, initially once daily, but recently increased to twice daily, leading to early depletion of her prescription.  The patient also mentions a recent CT scan revealed a small aneurysm, with a follow-up scan planned in six months. She is scheduled for an MRI today. She reports a history of severe headaches, which have improved recently and are not as extreme as before.  The patient also reports frequent eye discomfort, describing a scratchy sensation and constant need to rub her eyes. She uses over-the-counter eye drops and is due for a new glasses prescription soon. She has a history of visits to the [facility] but expresses dissatisfaction with the service there.  The patient is a long-term smoker, smoking for over 20 years, although she reports often wasting more cigarettes than she smokes. She has patches available but has not used them. She expresses concern about the potential impact of smoking cessation on her disability status.  Lastly, the patient mentions a history of pulmonary fibrosis, but she has not had a follow-up with pulmonology for over a year.      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    aspirin EC 81 MG EC tablet Take 1 tablet (81 mg total) by mouth daily. Swallow whole.    atorvastatin (LIPITOR) 40 MG tablet Take 1 tablet by mouth once daily    calcium carbonate (OSCAL) 1500 (600 Ca) MG TABS tablet Take 600 mg of elemental calcium by mouth 2 (two) times daily with a meal. (Patient not taking: Reported on 11/24/2022)    citalopram (CELEXA) 40 MG tablet Take 1 tablet (40 mg total) by mouth daily.    EPINEPHrine 0.3 mg/0.3 mL IJ SOAJ injection Inject 0.3 mg into the muscle as needed for anaphylaxis. Follow package instructions as needed for severe allergy or anaphylactic reaction.    ferrous gluconate (FERGON) 324 MG tablet Take 1 tablet (324 mg total) by mouth 2 (two) times daily with a meal.    folic acid (FOLVITE) 400 MCG tablet Take 400 mcg by mouth daily.    gabapentin (NEURONTIN) 300 MG capsule Take 1 capsule (300 mg total) by mouth 3 (three) times daily.  HYDROcodone-acetaminophen (NORCO) 5-325 MG tablet Take 1 tablet by mouth every 8 (eight) hours as needed for moderate pain. (Patient not taking: Reported on 11/24/2022)    linaclotide (LINZESS) 290 MCG CAPS capsule Take 1 capsule (290 mcg total) by mouth daily before breakfast.    Magnesium Citrate 100 MG CAPS Take by mouth.    methotrexate (RHEUMATREX) 10 MG tablet Take 60 mg by mouth once a week. Caution: Chemotherapy. Protect from light.    midodrine (PROAMATINE) 5 MG tablet Take 1 tablet (5 mg total) by mouth 3 (three) times daily with meals.     nicotine (NICODERM CQ - DOSED IN MG/24 HOURS) 21 mg/24hr patch Place 1 patch (21 mg total) onto the skin daily.    ondansetron (ZOFRAN) 4 MG tablet Take 1 tablet (4 mg total) by mouth 2 (two) times daily.    Pirfenidone 267 MG CAPS Take 1 capsule by mouth in the morning, at noon, and at bedtime.    predniSONE (DELTASONE) 2.5 MG tablet Take 2.5 mg by mouth daily.    [DISCONTINUED] ondansetron (ZOFRAN-ODT) 4 MG disintegrating tablet Take 1 tablet (4 mg total) by mouth every 8 (eight) hours as needed for nausea or vomiting.    [DISCONTINUED] pantoprazole (PROTONIX) 40 MG tablet Take 1 tablet (40 mg total) by mouth daily. 11/24/2022: Patient has been taking BID   No facility-administered medications prior to visit.    Review of Systems      Objective    BP 115/60 (BP Location: Right Arm, Patient Position: Sitting, Cuff Size: Normal)   Pulse 66   Temp 98.6 F (37 C) (Oral)   Ht 5\' 2"  (1.575 m)   Wt 148 lb (67.1 kg)   LMP 01/14/2009   SpO2 99%   BMI 27.07 kg/m     Physical Exam Vitals reviewed.  Constitutional:      General: She is not in acute distress.    Appearance: Normal appearance. She is not ill-appearing, toxic-appearing or diaphoretic.  Eyes:     Conjunctiva/sclera: Conjunctivae normal.  Cardiovascular:     Rate and Rhythm: Normal rate and regular rhythm.     Pulses: Normal pulses.     Heart sounds: Normal heart sounds. No murmur heard.    No friction rub. No gallop.  Pulmonary:     Effort: Pulmonary effort is normal. No respiratory distress.     Breath sounds: Normal breath sounds. No stridor. No wheezing, rhonchi or rales.  Abdominal:     General: Bowel sounds are normal. There is no distension.     Palpations: Abdomen is soft.     Tenderness: There is no abdominal tenderness.  Musculoskeletal:     Left lower leg: No edema.     Right Lower Extremity: Right leg is amputated below knee.  Skin:    Findings: No erythema or rash.  Neurological:     Mental Status:  She is alert and oriented to person, place, and time.       No results found for any visits on 11/24/22.  Assessment & Plan     Problem List Items Addressed This Visit     Nausea - Primary   Relevant Medications   ondansetron (ZOFRAN-ODT) 4 MG disintegrating tablet   Tobacco use   Relevant Orders   Ambulatory Referral Lung Cancer Screening [facility]   Other Visit Diagnoses     Need for shingles vaccine       Relevant Orders   Zoster Recombinant (Shingrix ) (Completed)  Gastropathy Biopsy results from EGD showed gastropathy. Negative for H. Pylori. Another EGD scheduled for 12/12/2022. Patient has been taking Protonix once daily, but was advised to take it twice daily. However, the prescription is not filled enough for twice daily use. -Communicate with GI specialist to adjust Protonix prescription to accommodate twice daily use.  Nausea Patient experiences severe nausea, taking Zofran 2-3 times daily. -Refilled Zofran prescription for a solid month's worth.  Small Aneurysm CT scan revealed a small aneurysm. Another CT scan scheduled in six months. MRI scheduled for 11/24/2022. -Follow up with neurologist to discuss results and plan.  Headaches Headaches have improved and are not as severe as before. -Continue current management plan.  Dry Eyes Patient experiences discomfort in eyes, feels like scratchiness. Currently using over-the-counter drops. -Recommend lubricating eye drops for dry eyes.  Smoking Patient has been smoking for over 20 years, sometimes less than a pack a day. -Encourage smoking cessation and suggest setting a quit date. Recommend using nicotine patches to help with cravings.  General Health Maintenance -Order lung cancer screening CT due to patient's smoking history. -Schedule follow-up appointment in January 2025.         Return in about 4 months (around 03/26/2023) for CHRONIC F/U, health maint.         Ronnald Ramp, MD  [facility] [phone] (phone) [phone] (fax)  [facility]

## 2022-11-24 NOTE — Progress Notes (Signed)
Patient came to our office to clarify her medication.  She was seen for office visit yesterday and was confused about a few of her medications.  Today she reports that she has been on pantoprazole 40 mg once daily for several months.  She was on this medication prior to her EGD which showed erosive gastritis.  I advised patient to increase pantoprazole to 40 mg 1 tablet twice daily to treat erosive gastritis.  Continue with plan for repeat EGD 01/11/2023 to verify healing on high-dose PPI.  After that we may consider decreasing back to once daily dose.  I sent a new prescription for pantoprazole 40 Mg twice daily to her pharmacy.  Patient is aware.

## 2022-11-28 ENCOUNTER — Emergency Department
Admission: EM | Admit: 2022-11-28 | Discharge: 2022-11-28 | Disposition: A | Discharge status: Home / Self Care | Payer: Medicaid Other | Attending: Emergency Medicine | Admitting: Emergency Medicine

## 2022-11-28 ENCOUNTER — Emergency Department: Payer: Medicaid Other

## 2022-11-28 ENCOUNTER — Other Ambulatory Visit: Payer: Self-pay

## 2022-11-28 DIAGNOSIS — S72414A Nondisplaced unspecified condyle fracture of lower end of right femur, initial encounter for closed fracture: Secondary | ICD-10-CM | POA: Insufficient documentation

## 2022-11-28 DIAGNOSIS — M25569 Pain in unspecified knee: Secondary | ICD-10-CM | POA: Diagnosis present

## 2022-11-28 DIAGNOSIS — W19XXXA Unspecified fall, initial encounter: Secondary | ICD-10-CM | POA: Diagnosis not present

## 2022-11-28 DIAGNOSIS — Z89511 Acquired absence of right leg below knee: Secondary | ICD-10-CM

## 2022-11-28 MED ORDER — ONDANSETRON HCL 4 MG/2ML IJ SOLN
4.0000 mg | Freq: Once | INTRAMUSCULAR | Status: AC
  Administered 2022-11-28: 4 mg via INTRAVENOUS
  Filled 2022-11-28: qty 2

## 2022-11-28 MED ORDER — OXYCODONE-ACETAMINOPHEN 7.5-325 MG PO TABS
1.0000 | ORAL_TABLET | Freq: Three times a day (TID) | ORAL | 0 refills | Status: AC | PRN
Start: 2022-11-28 — End: 2022-12-03

## 2022-11-28 MED ORDER — MORPHINE SULFATE (PF) 4 MG/ML IV SOLN
4.0000 mg | Freq: Once | INTRAVENOUS | Status: AC
  Administered 2022-11-28: 4 mg via INTRAVENOUS
  Filled 2022-11-28: qty 1

## 2022-11-28 NOTE — Discharge Instructions (Addendum)
You were evaluated in the ED for a fall.  Your x-ray of your right knee reveals a fracture of your femur bone.  You will be placed in a splint and will need to follow-up with orthopedics in 2 to 3 days.  Call and schedule an appointment on Monday.  Nonweightbearing status.  Use your wheelchair to get around at home.  Your rib x-ray and hip x-ray are normal.  Apply ice over the affected area as needed.

## 2022-11-28 NOTE — ED Notes (Signed)
Splint applied per order

## 2022-11-28 NOTE — ED Provider Notes (Signed)
[facility] Emergency Department Provider Note     Event Date/Time   First MD Initiated Contact with Patient 11/28/22 1546     (approximate)   History   Leg Pain   HPI  Alexandra [name] is a 52 y.o. female with a history of below the knee amputation presents to the ED following a fall.  Patient reports she stood up without her prosthetic leg and fell immediately on to her amputation site.  Severe pain and swelling noted.  No head injury or LOC.  Patient reports lateral knee pain with radiation to her hip.  She has symptoms include left rib pain.  Denies chest pain or shortness of breath.    Physical Exam   Triage Vital Signs: ED Triage Vitals  Encounter Vitals Group     BP 11/28/22 1456 (!) 116/50     Systolic BP Percentile --      Diastolic BP Percentile --      Pulse Rate 11/28/22 1456 71     Resp 11/28/22 1456 17     Temp 11/28/22 1456 98 F (36.7 C)     Temp Source 11/28/22 1456 Oral     SpO2 11/28/22 1456 96 %     Weight 11/28/22 1449 147 lb 11.3 oz (67 kg)     Height 11/28/22 1449 5\' 2"  (1.575 m)     Head Circumference --      Peak Flow --      Pain Score 11/28/22 1448 10     Pain Loc --      Pain Education --      Exclude from Growth Chart --     Most recent vital signs: Vitals:   11/28/22 1456  BP: (!) 116/50  Pulse: 71  Resp: 17  Temp: 98 F (36.7 C)  SpO2: 96%    General: Well appearing. Alert and oriented. INAD.  Skin:  Warm, dry and intact. No rashes or lesions noted.     Head:  NCAT.  Eyes:  PERRLA. EOMI.  CV:  Good peripheral perfusion.  RESP:  Normal effort. LCTAB.  ABD:  No distention. Soft, Non tender. No masses or organomegaly. No CVA tenderness bilaterally.  MSK:   BKA of right extremity.  Moderate swelling over lateral condyle. Skin appears normal. No erythema or open wounds. FROM limited due to pain.  NEURO: Cranial nerves intact. No focal deficits. Sensation and motor function intact.    ED Results /  Procedures / Treatments   Labs (all labs ordered are listed, but only abnormal results are displayed) Labs Reviewed - No data to display RADIOLOGY  I personally viewed and evaluated these images as part of my medical decision making, as well as reviewing the written report by the radiologist.  ED Provider Interpretation: Fracture of distal right femur from by x-ray.  Left ribs are unremarkable.  Hip x-ray appears normal.  Will confirm with final radiologist read.  DG Knee Complete 4 Views Right  Result Date: 11/28/2022 CLINICAL DATA:  Fall, pain along knee/stump EXAM: RIGHT KNEE - COMPLETE 4+ VIEW COMPARISON:  07/28/2022 FINDINGS: Below the knee amputation noted with bony demineralization in the distal femur and in the remaining portions the proximal tibia and fibula similar to the prior exam. Lipohemarthrosis noted. Suspected anterior cortical along the femoral condyles only readily appreciated on the lateral projection, with ill-defined lucency in this vicinity new compared to the prior exam. IMPRESSION: 1. Suspected anterior cortical fracture along the femoral condyles  only readily appreciated on the lateral projection, with ill-defined lucency in this vicinity new compared to the prior exam. 2. New Lipohemarthrosis. 3. Below the knee amputation with bony demineralization in the distal femur and in the remaining portions of the proximal tibia and fibula similar to the prior exam. Electronically Signed   By: Gaylyn Rong M.D.   On: 11/28/2022 16:08   DG Ribs Unilateral W/Chest Left  Result Date: 11/28/2022 CLINICAL DATA:  Fall, left rib pain EXAM: LEFT RIBS AND CHEST - 3+ VIEW COMPARISON:  03/19/2022 FINDINGS: Stable reticular interstitial accentuation in both lungs. Emphysema. Cardiac and mediastinal margins appear normal. No pneumothorax or pleural effusion. Mild lateral contour irregularity of the left lateral seventh, eighth, and ninth ribs is unchanged 09/02/2020 and compatible with old  healed fractures. I do not see a definite superimposed acute fracture in this vicinity. IMPRESSION: 1. Old healed left lateral seventh, eighth, and ninth rib fractures. No definite superimposed acute fracture is identified. 2. Emphysema and chronic reticular interstitial accentuation in the lungs. Electronically Signed   By: Gaylyn Rong M.D.   On: 11/28/2022 16:04   DG Hip Unilat W or Wo Pelvis 2-3 Views Right  Result Date: 11/28/2022 CLINICAL DATA:  Fall, hip pain EXAM: DG HIP (WITH OR WITHOUT PELVIS) 2-3V RIGHT COMPARISON:  CT pelvis 06/12/2022 FINDINGS: Generally similar degree of irregular spurring along the right femoral head and neck compared to 08/04/2022 and CT pelvis from 06/12/2022. This is irregular but I am skeptical of an underlying subcapital fracture given the similarity in appearance to prior. There is also notable spurring laterally along the left femoral head and neck and mild spurring of the acetabula. 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 No fracture of the bony pelvis identified. IMPRESSION: 1. No definite fracture identified. 2. Irregular spurring along the right femoral head and neck is similar to prior examinations. There is also degenerative spurring of the left femoral head. 3. If the patient is unable to bear weight due to hip symptoms, consider CT or MRI to further characterize. 4. 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 Electronically Signed   By: Gaylyn Rong M.D.   On: 11/28/2022 16:00    PROCEDURES:  Critical Care performed: No  Procedures  MEDICATIONS ORDERED IN ED: Medications  morphine (PF) 4 MG/ML injection 4 mg (4 mg Intravenous Given 11/28/22 1644)  ondansetron (ZOFRAN) injection 4 mg (4 mg Intravenous Given  11/28/22 1645)    IMPRESSION / MDM / ASSESSMENT AND PLAN / ED COURSE  I reviewed the triage vital signs and the nursing notes.                             Clinical Course as of 11/28/22 1715  Sun Nov 28, 2022  1635 Obtained consult Dr Hyacinth Meeker. Will review images and call back  [MH]    Clinical Course User Index [MH] Kern Reap A, PA-C    52 y.o. female presents to the emergency department for evaluation and treatment of acute right. See HPI for further details.   Differential diagnosis includes, but is not limited to fracture, dislocation, sprain, BKA complication  Patient's presentation is most consistent with acute complicated illness / injury requiring diagnostic workup.  Physical Exam is as stated above.  Patient received morphine and Zofran for pain management.  Orthopedic Dr. Hyacinth Meeker consult obtained for further management recommendations.  Patient will be placed in a posterior stirrup splint and will be nonweightbearing status.  Patient is instructed to follow-up with Dr. Hyacinth Meeker in office 2 to 3 days.  Stable condition for discharge home. She has a wheelchair at home and is instructed to get around in his wheelchair.  encouraged to apply ice over the affected area.  All questions asked during ED visit.  ED precautions discussed.  FINAL CLINICAL IMPRESSION(S) / ED DIAGNOSES   Final diagnoses:  Hx of BKA, right (HCC)  Closed nondisplaced fracture of condyle of right femur, initial encounter (HCC)   Rx / DC Orders   ED Discharge Orders          Ordered    oxyCODONE-acetaminophen (PERCOCET) 7.5-325 MG tablet  Every 8 hours PRN        11/28/22 1715           Note:  This document was prepared using Dragon voice recognition software and may include unintentional dictation errors.    Romeo Apple, Avri Paiva A, PA-C 11/28/22 1718    Corena Herter, MD 11/28/22 2322

## 2022-11-28 NOTE — ED Provider Notes (Signed)
Shared visit   BKA with questionable femur fracture.  Discussed with Dr. Hyacinth Meeker with orthopedics who recommended posterior leg splint and discharged with outpatient follow-up.  Splint applied while in the emergency department.  Given information for follow-up.   Corena Herter, MD 11/28/22 1705

## 2022-11-28 NOTE — ED Triage Notes (Signed)
First nurse note: Pt here via [facility] from home. Pt forgot her prosthetic was off and fell onto R knee, pt on Eliquis.   124/61 Hr:82 99%

## 2022-11-28 NOTE — ED Triage Notes (Signed)
See first nurse note.  Pt endorses L rib pain as well as R knee and R thigh pain. Pt denies LOC.

## 2022-11-29 ENCOUNTER — Telehealth (INDEPENDENT_AMBULATORY_CARE_PROVIDER_SITE_OTHER): Payer: Self-pay | Admitting: Vascular Surgery

## 2022-11-29 NOTE — Telephone Encounter (Signed)
Pt called in for an FYI - but she is concerned about any issues that may arise. She states that she broke her femur in her amputated leg. States that she is a high risk for blood clots and she has stents in her leg as well. States that she is just worried about any complications and would like Dr. Wyn Quaker to know. If there is anything that she may need to know to please call.

## 2022-11-29 NOTE — Telephone Encounter (Signed)
A broken femur places anyone at risk for a DVT.  The fact that she is on eliquis and aspirin are helpful and preventative factors for this.  We have seen issues where the surgery to fix this can have some effects on the stents.  I would recommend that she proceed with recommendations by orthopedic surgery and then see [place] a couple weeks after repair with an arteria duplex of that leg to evaluate stents.

## 2022-11-29 NOTE — Telephone Encounter (Signed)
LVM for pt advising of Sheppard Plumber, NP notes/recommendations. Advised to call with any further questions/concerns.

## 2022-12-29 ENCOUNTER — Telehealth: Payer: Self-pay | Admitting: Physician Assistant

## 2022-12-29 NOTE — Telephone Encounter (Signed)
Patient called and left a voicemail stating she had got a message from the office to call back. I called the patient back and left a voicemail for her to call us back.

## 2023-01-04 ENCOUNTER — Telehealth: Payer: Self-pay

## 2023-01-04 NOTE — OR Nursing (Signed)
Called patient for preop called mentionned that she would have to rescheduled. Gave her the office phone number [phone]

## 2023-01-04 NOTE — Telephone Encounter (Signed)
Spoke with patient- she fell and broke her femur- EGD 01-11-23 with Dr.anna- spoke with [facility].Patient will call back after she heels from broken femur.

## 2023-01-11 ENCOUNTER — Ambulatory Visit: Admit: 2023-01-11 | Payer: Medicaid Other | Admitting: Gastroenterology

## 2023-01-11 SURGERY — ESOPHAGOGASTRODUODENOSCOPY (EGD) WITH PROPOFOL
Anesthesia: General

## 2023-01-17 ENCOUNTER — Telehealth: Payer: Self-pay | Admitting: Emergency Medicine

## 2023-01-17 NOTE — Telephone Encounter (Signed)
Left msg for pt to callback   I'll verify the address and place a new order for [facility]

## 2023-01-17 NOTE — Telephone Encounter (Signed)
-----   Message from Nurse Rinaldo Cloud A sent at 01/17/2023  8:52 AM EST ----- Regarding: RE: Monitor This monitor was reported lost via email notification by [facility]. So next steps are for you to reach out to the patient for follow up on the monitor and determine if another one should be sent to the patient. ----- Message ----- From: Ursula Alert, RN Sent: 01/14/2023  12:44 PM EST To: Bryna Colander, RN Subject: RE: Monitor                                    Pam, I'm not sure what to do here.  Was the monitor lost before it got to the pt ar after?   Thanks, Danelle Earthly ----- Message ----- From: Bryna Colander, RN Sent: 01/14/2023  10:46 AM EDT To: Ursula Alert, RN Subject: Monitor                                        Zio monitor lost

## 2023-01-21 ENCOUNTER — Telehealth: Payer: Self-pay | Admitting: Emergency Medicine

## 2023-01-21 NOTE — Telephone Encounter (Signed)
Call to pt and another message left for call back

## 2023-01-25 ENCOUNTER — Telehealth: Payer: Self-pay | Admitting: Emergency Medicine

## 2023-01-25 NOTE — Telephone Encounter (Signed)
No note

## 2023-02-14 ENCOUNTER — Ambulatory Visit: Payer: Medicaid Other | Admitting: Family Medicine

## 2023-03-21 ENCOUNTER — Encounter: Payer: Self-pay | Admitting: Oncology

## 2023-03-28 ENCOUNTER — Ambulatory Visit: Payer: Medicare Other | Admitting: Family Medicine

## 2023-03-28 ENCOUNTER — Encounter: Payer: Self-pay | Admitting: Family Medicine

## 2023-03-28 NOTE — Progress Notes (Deleted)
 Established patient visit   Patient: Alexandra [name]   DOB: 09-08-1970   52 y.o. Female  MRN: [id number] Visit Date: 03/28/2023  Today's healthcare provider: Rockie Agent, MD   No chief complaint on file.  Subjective       Discussed the use of AI scribe software for clinical note transcription with the patient, who gave verbal consent to proceed.  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   aspirin  EC 81 MG EC tablet Take 1 tablet (81 mg total) by mouth daily. Swallow whole.   atorvastatin  (LIPITOR) 40 MG tablet Take 1 tablet by mouth once daily   calcium  carbonate (OSCAL) 1500 (600 Ca) MG TABS tablet Take 600 mg of elemental calcium  by mouth 2 (two) times daily with a meal. (Patient not taking: Reported on 11/24/2022)   citalopram  (CELEXA ) 40 MG tablet Take 1 tablet (40 mg total) by mouth daily.   EPINEPHrine  0.3 mg/0.3 mL IJ SOAJ injection Inject 0.3 mg into the muscle as needed for anaphylaxis. Follow package instructions as needed for severe allergy or anaphylactic reaction.   ferrous gluconate  (FERGON) 324 MG tablet Take 1 tablet (324 mg total) by mouth 2 (two) times daily with a meal.   folic acid (FOLVITE) 400 MCG tablet Take 400 mcg by mouth daily.   gabapentin  (NEURONTIN ) 300 MG capsule Take 1 capsule (300 mg total) by mouth 3 (three) times daily.   linaclotide  (LINZESS ) 290 MCG CAPS capsule Take 1 capsule (290 mcg total) by mouth daily before breakfast.   Magnesium  Citrate 100 MG CAPS Take by mouth.    methotrexate (RHEUMATREX) 10 MG tablet Take 60 mg by mouth once a week. Caution: Chemotherapy. Protect from light.   midodrine  (PROAMATINE ) 5 MG tablet Take 1 tablet (5 mg total) by mouth 3 (three) times daily with meals.   nicotine  (NICODERM CQ  - DOSED IN MG/24 HOURS) 21 mg/24hr patch Place 1 patch (21 mg total) onto the skin daily.   ondansetron  (ZOFRAN -ODT) 4 MG disintegrating tablet Take 1 tablet (4 mg total) by mouth every 8 (eight) hours as needed for nausea or vomiting.   pantoprazole  (PROTONIX ) 40 MG tablet Take 1 tablet (40 mg total) by mouth 2 (two) times daily.   Pirfenidone 267 MG CAPS Take 1 capsule by mouth in the morning, at noon, and at bedtime.   predniSONE  (DELTASONE ) 2.5 MG tablet Take 2.5 mg by mouth daily.   No facility-administered medications prior to visit.    Review of Systems  {Insert previous labs (optional):23779} {See past labs  Heme  Chem  Endocrine  Serology  Results Review (optional):1}   Objective    LMP 01/14/2009  {Insert last BP/Wt (optional):23777}{See vitals history (optional):1}    Physical Exam  ***  No results found for any visits on 03/28/23.  Assessment & Plan     Problem List Items Addressed This Visit  Cardiovascular and Mediastinum   Vasculitis (HCC)   Aortic thrombus (HCC)     Respiratory   Pulmonary fibrosis (HCC)     Digestive   Chronic constipation     Musculoskeletal and Integument   Rheumatoid arthritis (HCC)     Other   Tobacco use   Major depressive disorder with single episode, in partial remission (HCC) - Primary   Iron  deficiency anemia    Assessment and Plan              No follow-ups on file.         Rockie Agent, MD  [facility] [phone] (phone) [phone] (fax)  [facility]

## 2023-04-25 ENCOUNTER — Encounter: Payer: Self-pay | Admitting: Oncology

## 2023-05-02 ENCOUNTER — Telehealth: Payer: Self-pay

## 2023-05-02 ENCOUNTER — Other Ambulatory Visit: Payer: Self-pay

## 2023-05-02 ENCOUNTER — Encounter: Payer: Self-pay | Admitting: Oncology

## 2023-05-02 DIAGNOSIS — R519 Headache, unspecified: Secondary | ICD-10-CM | POA: Diagnosis not present

## 2023-05-02 DIAGNOSIS — G546 Phantom limb syndrome with pain: Secondary | ICD-10-CM | POA: Diagnosis not present

## 2023-05-02 DIAGNOSIS — F17200 Nicotine dependence, unspecified, uncomplicated: Secondary | ICD-10-CM | POA: Diagnosis not present

## 2023-05-02 DIAGNOSIS — K2960 Other gastritis without bleeding: Secondary | ICD-10-CM

## 2023-05-02 NOTE — Telephone Encounter (Signed)
Mailed instructions for EGD -scheduled 05-26-23- sent Eliquis clearance to Dr.Patel [facility].

## 2023-05-02 NOTE — Telephone Encounter (Signed)
Pt left a vm she is ready to reschedule her EGD that was scheduled with Dr. Tobi Bastos. She is a [employer] patient.

## 2023-05-09 ENCOUNTER — Encounter: Payer: Self-pay | Admitting: Oncology

## 2023-05-09 ENCOUNTER — Encounter: Payer: Self-pay | Admitting: Family Medicine

## 2023-05-09 ENCOUNTER — Ambulatory Visit (INDEPENDENT_AMBULATORY_CARE_PROVIDER_SITE_OTHER): Payer: Medicare HMO | Admitting: Family Medicine

## 2023-05-09 VITALS — BP 108/67 | HR 86 | Resp 16

## 2023-05-09 DIAGNOSIS — J8410 Pulmonary fibrosis, unspecified: Secondary | ICD-10-CM | POA: Diagnosis not present

## 2023-05-09 DIAGNOSIS — M25461 Effusion, right knee: Secondary | ICD-10-CM | POA: Diagnosis not present

## 2023-05-09 DIAGNOSIS — Z89511 Acquired absence of right leg below knee: Secondary | ICD-10-CM

## 2023-05-09 MED ORDER — ALBUTEROL SULFATE HFA 108 (90 BASE) MCG/ACT IN AERS: 2.0000 | INHALATION_SPRAY | Freq: Four times a day (QID) | RESPIRATORY_TRACT | 2 refills | Status: DC | PRN

## 2023-05-09 NOTE — Assessment & Plan Note (Signed)
 S/p vasculitis, ischemic leg.  Follows with prosthetist, neuro.  Manages phantom limb pain with gabapentin 300 mg tid  Concerns for increase stump swelling post R femur fracture in September 2024 She is wearing tighter on Stump, but still having a hard time placing prosthetic on She is concerned that she is have residual issues post femur fracture she would like to see ortho first before any prosthetic adjustments Recommend still calling to get an person eval on stump changes

## 2023-05-09 NOTE — Progress Notes (Signed)
 Acute Office Visit  Introduced to nurse practitioner role and practice setting.  All questions answered.  Discussed provider/patient relationship and expectations.   Subjective:     Patient ID: Alexandra [name], female    DOB: [birth year], 53 y.o.   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  She has been experiencing persistent swelling in her leg since a femur fracture in September, which has prevented her from fitting back into her prosthetic. She describes a sensation of something being 'wrong' with her knee, noting that it sometimes feels like it moves in slow motion and is difficult to bend. There is no significant pain, but she mentions occasional discomfort and a feeling of something being off inside the knee. She did not undergo surgery for the femur fracture, as it was a clean break, and the treatment involved wrapping the leg. She has been engaging in physical therapy to improve knee mobility, as her kneecap was previously misaligned. Despite these efforts, she continues to experience issues with fitting her prosthetic, even after using a shrinker for a month.  Her medical history includes an amputation in 2022 due to an ulcer on her ankle that led to bone involvement, necessitating the procedure. This was complicated by blood clots in her veins, attributed to Wegener's disease, for which she has had stents placed. She has been using a prosthetic successfully until the recent fracture. Amputation was done by Dr. Wyn Quaker, Vascular Surgeon.   She has a history of pulmonary fibrosis and reports occasional wheezing, particularly on the right side. She has not been using an inhaler regularly but has used her daughter's inhaler in the  past when symptoms worsened.  She is scheduled for an upper endoscopy next month 05/26/23 due to a history of gastritis. She takes Eliquis, which she was advised to hold for a short period before the procedure due to her clotting disorder.     Review of Systems  All other systems reviewed and are negative.     Objective:    BP 108/67 (BP Location: Right Arm, Patient Position: Sitting, Cuff Size: Normal)   Pulse 86   Resp 16   LMP 01/14/2009   SpO2 100%    Physical Exam Vitals reviewed.  Constitutional:      Appearance: Normal appearance. She is normal weight.  Eyes:     Extraocular Movements: Extraocular movements intact.     Pupils: Pupils are equal, round, and reactive to light.  Neck:     Vascular: No carotid bruit.  Cardiovascular:     Rate and Rhythm: Normal rate and regular rhythm.     Pulses: Normal pulses.          Radial pulses are 2+ on the right side and 2+ on the left side.       Popliteal pulses are 2+ on the right side.       Dorsalis pedis pulses are 2+ on the left side.       Posterior tibial pulses are 2+ on the left side.     Heart sounds: Normal heart sounds. No murmur heard.    No friction rub. No gallop.  Pulmonary:     Effort: Pulmonary effort  is normal. No respiratory distress.     Breath sounds: No stridor. Examination of the left-upper field reveals wheezing. Examination of the left-lower field reveals wheezing. Wheezing present. No rhonchi or rales.  Chest:     Chest wall: No tenderness.  Musculoskeletal:     Cervical back: Normal range of motion and neck supple.     Right knee: Swelling and crepitus present.     Left lower leg: No edema.     Comments: Mild swelling on R knee, crepitus felt with extension and flexion of R knee. Popliteal pulse palpable. No signs of erythema or infection. Sensation intact.      Right Lower Extremity: Right leg is amputated below knee.  Lymphadenopathy:     Cervical: No cervical adenopathy.  Skin:    General:  Skin is warm and dry.     Capillary Refill: Capillary refill takes less than 2 seconds.  Neurological:     General: No focal deficit present.     Mental Status: She is alert and oriented to person, place, and time. Mental status is at baseline.  Psychiatric:        Mood and Affect: Mood normal.        Behavior: Behavior normal.        Thought Content: Thought content normal.        Judgment: Judgment normal.     No results found for any visits on 05/09/23.      Assessment & Plan:   Problem List Items Addressed This Visit       Respiratory   Pulmonary fibrosis (HCC)   Wheezing noted on examination L upper and lower lobes, no recent follow-up with Pulmonology. -Prescribe Albuterol inhaler, use twice daily for the next few days then as needed. -No resp distress, SOB, or DOB, Sp02 = 100% Ra -Recommend follow-up with Pulmonology for ongoing management of pulmonary fibrosis.      Relevant Medications   albuterol (VENTOLIN HFA) 108 (90 Base) MCG/ACT inhaler     Other   Status post below-knee amputation of right lower extremity (HCC)   S/p vasculitis, ischemic leg.  Follows with prosthetist, neuro.  Manages phantom limb pain with gabapentin 300 mg tid  Concerns for increase stump swelling post R femur fracture in September 2024 She is wearing tighter on Stump, but still having a hard time placing prosthetic on She is concerned that she is have residual issues post femur fracture she would like to see ortho first before any prosthetic adjustments Recommend still calling to get an person eval on stump changes        Relevant Orders   AMB referral to orthopedics   Swelling of joint of right knee - Primary   Post R femur fracture (September 2024).  Crepitus and mild swelling at joint R BKA Prosthetics not fitting well States R just doesn't feel sit, feels like something in it, pressure Not painful  ROM in tact Strength 5/5 Popliteal pulse +2, sensation baseline, no  discoloration, obvious infection Would like referral to ortho for further eval       Relevant Orders   AMB referral to orthopedics    Meds ordered this encounter  Medications   albuterol (VENTOLIN HFA) 108 (90 Base) MCG/ACT inhaler    Sig: Inhale 2 puffs into the lungs every 6 (six) hours as needed for wheezing or shortness of breath.    Dispense:  8 g    Refill:  2    Return for with PCP for  chronic disease mgmt.  I, Sallee Provencal, FNP, have reviewed all documentation for this visit. The documentation on 05/09/23 for the exam, diagnosis, procedures, and orders are all accurate and complete.   Sallee Provencal, FNP

## 2023-05-09 NOTE — Assessment & Plan Note (Signed)
 Post R femur fracture (September 2024).  Crepitus and mild swelling at joint R BKA Prosthetics not fitting well States R just doesn't feel sit, feels like something in it, pressure Not painful  ROM in tact Strength 5/5 Popliteal pulse +2, sensation baseline, no discoloration, obvious infection Would like referral to ortho for further eval

## 2023-05-09 NOTE — Assessment & Plan Note (Addendum)
 Wheezing noted on examination L upper and lower lobes, no recent follow-up with Pulmonology. -Prescribe Albuterol inhaler, use twice daily for the next few days then as needed. -No resp distress, SOB, or DOB, Sp02 = 100% Ra -Recommend follow-up with Pulmonology for ongoing management of pulmonary fibrosis.

## 2023-05-19 ENCOUNTER — Telehealth: Payer: Self-pay

## 2023-05-19 NOTE — Telephone Encounter (Signed)
 Thanks Shelia.

## 2023-05-19 NOTE — Telephone Encounter (Signed)
 LVM for patient to call office to reschedule her 05/26/23 colonoscopy due to Dr. Tobi Bastos will not be scoping on 05/26/23.  Thanks, Gideon, [place]

## 2023-05-24 ENCOUNTER — Telehealth: Payer: Self-pay

## 2023-05-24 NOTE — Telephone Encounter (Signed)
 Copied from CRM [id number]. Topic: General - Other >> May 23, 2023  4:20 PM Turkey B wrote: Reason for CRM: Herbert Seta from [facility] called in about referral,recommends pt seeing Dr Ernest Pine for vascular, if the pt has swelling or improper fitting prosthetic.

## 2023-05-25 ENCOUNTER — Encounter: Payer: Self-pay | Admitting: Family Medicine

## 2023-05-25 NOTE — Telephone Encounter (Signed)
 Reviewed. Patient scheduled with vascular surgery

## 2023-06-02 ENCOUNTER — Encounter: Payer: Self-pay | Admitting: Oncology

## 2023-06-06 ENCOUNTER — Telehealth: Payer: Self-pay | Admitting: Gastroenterology

## 2023-06-06 NOTE — Telephone Encounter (Signed)
 Pt has procedure on 06/08/2023 requesting call back to discuss stopping eliquis before  the procedure

## 2023-06-07 NOTE — Telephone Encounter (Signed)
 Pt call stating that she hasn't received a call back to discuss stopping her eliquis before the procedure please return call

## 2023-06-08 ENCOUNTER — Encounter
Admission: RE | Disposition: A | Discharge status: Home / Self Care | Payer: Self-pay | Source: Home / Self Care | Attending: Gastroenterology

## 2023-06-08 ENCOUNTER — Encounter: Payer: Self-pay | Admitting: Anesthesiology

## 2023-06-08 ENCOUNTER — Ambulatory Visit
Admission: RE | Admit: 2023-06-08 | Discharge: 2023-06-08 | Disposition: A | Discharge status: Home / Self Care | Payer: Medicare HMO | Attending: Gastroenterology | Admitting: Gastroenterology

## 2023-06-08 ENCOUNTER — Ambulatory Visit (INDEPENDENT_AMBULATORY_CARE_PROVIDER_SITE_OTHER): Payer: Medicaid Other | Admitting: Nurse Practitioner

## 2023-06-08 ENCOUNTER — Encounter: Payer: Self-pay | Admitting: Gastroenterology

## 2023-06-08 ENCOUNTER — Other Ambulatory Visit (INDEPENDENT_AMBULATORY_CARE_PROVIDER_SITE_OTHER): Payer: Medicare HMO

## 2023-06-08 DIAGNOSIS — Z538 Procedure and treatment not carried out for other reasons: Secondary | ICD-10-CM | POA: Diagnosis not present

## 2023-06-08 DIAGNOSIS — K2960 Other gastritis without bleeding: Secondary | ICD-10-CM | POA: Insufficient documentation

## 2023-06-08 SURGERY — ESOPHAGOGASTRODUODENOSCOPY (EGD) WITH PROPOFOL
Anesthesia: General

## 2023-06-08 MED ORDER — SODIUM CHLORIDE 0.9 % IV SOLN: INTRAVENOUS | Status: DC

## 2023-06-08 NOTE — H&P (Signed)
 EGD cancelled since she is still taking BC powders and reason for procedure is to check for healing of ulcers- reschedule in 6 weeks

## 2023-06-08 NOTE — OR Nursing (Signed)
 Patient claimed she was not told to stop BC powders at last procedure. Also  made several attempts to talk to [facility] office to ask about eliquis and no one ever returned call. She feels the communication between her, [facility] and her cardiologist has been poor.

## 2023-06-08 NOTE — Anesthesia Preprocedure Evaluation (Addendum)
 Anesthesia Evaluation  Patient identified by MRN, date of birth, ID band Patient awake    Reviewed: Allergy & Precautions, NPO status , Patient's Chart, lab work & pertinent test results  History of Anesthesia Complications Negative for: history of anesthetic complications  Airway Mallampati: II  TM Distance: >3 FB Neck ROM: full    Dental no notable dental hx.    Pulmonary Current Smoker Pulm fibrosis     Pulmonary exam normal        Cardiovascular + Peripheral Vascular Disease  Normal cardiovascular exam     Neuro/Psych  PSYCHIATRIC DISORDERS Anxiety Depression     Neuromuscular disease    GI/Hepatic negative GI ROS, Neg liver ROS,,,  Endo/Other  negative endocrine ROS    Renal/GU negative Renal ROS  negative genitourinary   Musculoskeletal  (+) Arthritis , Osteoarthritis,    Abdominal   Peds  Hematology  (+) Blood dyscrasia, anemia   Anesthesia Other Findings Past Medical History: No date: Anxiety 2013: Breast discharge     Comment:  present X 3 years  Left only multiple ducts per pt No date: Clotting disorder (HCC) No date: Depression No date: Pulmonary fibrosis (HCC) No date: Rheumatoid arthritis (HCC) No date: 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)  Past Surgical History: No date: ABDOMINAL HYSTERECTOMY 09/12/2020: AMPUTATION; Right     Comment:  Procedure: AMPUTATION BELOW KNEE;  Surgeon: Annice Needy, MD;  Location: [facility] ORS;  Service: Vascular;                Laterality: Right; 11/10/2022: BIOPSY     Comment:  Procedure: BIOPSY;  Surgeon: Wyline Mood, MD;  Location:              [facility] ENDOSCOPY;  Service: Gastroenterology;; 2006: BREAST CYST EXCISION; Left 11/10/2022: COLONOSCOPY WITH PROPOFOL; N/A     Comment:  Procedure: COLONOSCOPY WITH PROPOFOL;  Surgeon: Wyline Mood, MD;  Location: [facility] ENDOSCOPY;  Service:               Gastroenterology;   Laterality: N/A; 09/08/2020: ENDOVASCULAR STENT INSERTION; N/A     Comment:  Procedure: ENDOVASCULAR STENT GRAFT INSERTION;  Surgeon:              Annice Needy, MD;  Location: [facility] ORS;  Service:               Vascular;  Laterality: N/A; 11/10/2022: ESOPHAGOGASTRODUODENOSCOPY (EGD) WITH PROPOFOL; N/A     Comment:  Procedure: ESOPHAGOGASTRODUODENOSCOPY (EGD) WITH               PROPOFOL;  Surgeon: Wyline Mood, MD;  Location: [facility]               ENDOSCOPY;  Service: Gastroenterology;  Laterality: N/A; 09/01/2020: LOWER EXTREMITY ANGIOGRAPHY; Right     Comment:  Procedure: Lower Extremity Angiography;  Surgeon: Annice Needy, MD;  Location: [facility] INVASIVE CV LAB;  Service:               Cardiovascular;  Laterality: Right; 10/01/2020: LOWER EXTREMITY ANGIOGRAPHY; Left     Comment:  Procedure: LOWER EXTREMITY ANGIOGRAPHY;  Surgeon: Annice Needy, MD;  Location: [facility] INVASIVE CV LAB;  Service:               Cardiovascular;  Laterality: Left; 09/07/2020: LOWER EXTREMITY INTERVENTION; Right     Comment:  Procedure: LOWER EXTREMITY INTERVENTION;  Surgeon:               Nada Libman, MD;  Location: [facility] INVASIVE CV LAB;                Service: Cardiovascular;  Laterality: Right;  BMI    Body Mass Index: 26.08 kg/m      Reproductive/Obstetrics negative OB ROS                             Anesthesia Physical Anesthesia Plan  ASA: 3  Anesthesia Plan: General   Post-op Pain Management: Minimal or no pain anticipated   Induction: Intravenous  PONV Risk Score and Plan: Propofol infusion and TIVA  Airway Management Planned: Natural Airway and Nasal Cannula  Additional Equipment:   Intra-op Plan:   Post-operative Plan:   Informed Consent: I have reviewed the patients History and Physical, chart, labs and discussed the procedure including the risks, benefits and alternatives for the proposed anesthesia with the patient or authorized  representative who has indicated his/her understanding and acceptance.     Dental Advisory Given  Plan Discussed with: Anesthesiologist, CRNA and Surgeon  Anesthesia Plan Comments: (Patient consented for risks of anesthesia including but not limited to:  - adverse reactions to medications - risk of airway placement if required - damage to eyes, teeth, lips or other oral mucosa - nerve damage due to positioning  - sore throat or hoarseness - Damage to heart, brain, nerves, lungs, other parts of body or loss of life  Patient voiced understanding and assent.)       Anesthesia Quick Evaluation

## 2023-06-09 ENCOUNTER — Telehealth: Payer: Self-pay

## 2023-06-09 NOTE — Telephone Encounter (Signed)
 Called patient to let her know that Dr. Tobi Bastos wanted me to reach out to her and schedule her an EGD in 6 weeks to make sure that her ulcers are healing with her PPI. Patient stated that she did not agree with Dr. Tobi Bastos and she stated that she wants to wait 3 months instead of 6 weeks. She asked to put her in our recall list and to call her when it's time. She stated that she will try to stay away from taking NSAIDs.

## 2023-06-14 ENCOUNTER — Other Ambulatory Visit (INDEPENDENT_AMBULATORY_CARE_PROVIDER_SITE_OTHER): Payer: Self-pay | Admitting: Nurse Practitioner

## 2023-06-14 DIAGNOSIS — Z89511 Acquired absence of right leg below knee: Secondary | ICD-10-CM

## 2023-06-14 DIAGNOSIS — Z9889 Other specified postprocedural states: Secondary | ICD-10-CM

## 2023-06-15 ENCOUNTER — Ambulatory Visit (INDEPENDENT_AMBULATORY_CARE_PROVIDER_SITE_OTHER)

## 2023-06-15 DIAGNOSIS — Z89511 Acquired absence of right leg below knee: Secondary | ICD-10-CM | POA: Diagnosis not present

## 2023-06-15 DIAGNOSIS — Z9889 Other specified postprocedural states: Secondary | ICD-10-CM

## 2023-06-15 DIAGNOSIS — I739 Peripheral vascular disease, unspecified: Secondary | ICD-10-CM

## 2023-06-22 ENCOUNTER — Ambulatory Visit (INDEPENDENT_AMBULATORY_CARE_PROVIDER_SITE_OTHER): Admitting: Nurse Practitioner

## 2023-06-30 ENCOUNTER — Inpatient Hospital Stay: Attending: Oncology | Admitting: Oncology

## 2023-06-30 ENCOUNTER — Encounter: Payer: Self-pay | Admitting: Oncology

## 2023-06-30 ENCOUNTER — Inpatient Hospital Stay

## 2023-06-30 VITALS — BP 105/72 | HR 73 | Temp 98.0°F | Resp 18 | Wt 142.7 lb

## 2023-06-30 DIAGNOSIS — I7782 Antineutrophilic cytoplasmic antibody (ANCA) vasculitis: Secondary | ICD-10-CM | POA: Insufficient documentation

## 2023-06-30 DIAGNOSIS — I7410 Embolism and thrombosis of unspecified parts of aorta: Secondary | ICD-10-CM | POA: Insufficient documentation

## 2023-06-30 DIAGNOSIS — Z7901 Long term (current) use of anticoagulants: Secondary | ICD-10-CM | POA: Diagnosis not present

## 2023-06-30 DIAGNOSIS — F17210 Nicotine dependence, cigarettes, uncomplicated: Secondary | ICD-10-CM | POA: Insufficient documentation

## 2023-06-30 NOTE — Progress Notes (Signed)
 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 I discussed with patient that her aortic thrombus was likely manifestation of her ANCA vasculitis. I recommend long-term anticoagulation with Eliquis 5 mg twice daily.  She has tolerated very well and has not developed any recurrence. Her previous hypercoagulable workup is overall negative. Low titer anticardiolipin IgM <40 is of no clinical significance.  I will hold off repeating.  ANCA-associated vasculitis (HCC) Continue follow-up with rheumatology for management. Currently she is on weekly methotrexate, daily low-dose prednisone and rituximab every 6 months.  Follow-up as needed. All questions were answered. The patient knows to call the clinic with any problems, questions or concerns.  Alexandra Forbes, MD, PhD [facility] Hematology Oncology 06/30/2023   HISTORY OF PRESENTING ILLNESS:   Alexandra [name] is a  53 y.o.  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  08/28/2020 - 09/16/2020 patient was admitted due to nonhealing ulcer over the right lateral malleolus and worsening pain.  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  She was noted to have P ANCA positive, seen by rheumatology and was diagnosed with P ANCA positive vasculitis. She was noted to have aortic plaque/thrombus of lower infrarenal abdominal aorta.  She underwent mechanical thrombectomy of the aorta and a right common iliac artery and stent placement to bilateral common iliac artery.  Patient was treated with IV heparin bridge to  request outpatient.  Patient has had hypercoagulable workup on 09/09/2020 Patient has negative lupus anticoagulant, normal beta-2 glycoprotein antibodies, normal Antithrombin III, normal protein C&S antigen and activity, negative prothrombin gene mutation, and factor V Leiden mutation Patient anticardiolipin IgG <9, IgM 24.   Patient takes Eliquis 5 mg twice daily.  She follows up with rheumatology outpatient.  She takes methotrexate, low-dose prednisone and has been on rituximab 500 mg every 6 months.    She experiences ongoing pain associated with her ANCA vasculitis, which is more pronounced at night and affects her hands, elbows, shoulders, and legs. 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)     SURGICAL HISTORY: Past Surgical History:  Procedure Laterality Date   ABDOMINAL HYSTERECTOMY     AMPUTATION Right 09/12/2020   Procedure: AMPUTATION BELOW KNEE;  Surgeon: Celso College, MD;  Location: [facility] ORS;  Service: Vascular;  Laterality: Right;   BIOPSY  11/10/2022   Procedure: BIOPSY;  Surgeon: Luke Salaam, MD;  Location: [facility] ENDOSCOPY;  Service: Gastroenterology;;   BREAST CYST EXCISION Left 2006   COLONOSCOPY WITH PROPOFOL N/A 11/10/2022   Procedure: COLONOSCOPY WITH PROPOFOL;  Surgeon: Luke Salaam, MD;  Location: [facility] ENDOSCOPY;  Service: Gastroenterology;  Laterality: N/A;   ENDOVASCULAR STENT INSERTION N/A 09/08/2020   Procedure: ENDOVASCULAR STENT GRAFT INSERTION;  Surgeon: Vonna Guardian,  Marlow Baars, MD;  Location: [facility] ORS;  Service: Vascular;  Laterality: N/A;   ESOPHAGOGASTRODUODENOSCOPY (EGD) WITH PROPOFOL N/A 11/10/2022   Procedure:  ESOPHAGOGASTRODUODENOSCOPY (EGD) WITH PROPOFOL;  Surgeon: Wyline Mood, MD;  Location: [facility] ENDOSCOPY;  Service: Gastroenterology;  Laterality: N/A;   LOWER EXTREMITY ANGIOGRAPHY Right 09/01/2020   Procedure: Lower Extremity Angiography;  Surgeon: Annice Needy, MD;  Location: [facility] INVASIVE CV LAB;  Service: Cardiovascular;  Laterality: Right;   LOWER EXTREMITY ANGIOGRAPHY Left 10/01/2020   Procedure: LOWER EXTREMITY ANGIOGRAPHY;  Surgeon: Annice Needy, MD;  Location: [facility] INVASIVE CV LAB;  Service: Cardiovascular;  Laterality: Left;   LOWER EXTREMITY INTERVENTION Right 09/07/2020   Procedure: LOWER EXTREMITY INTERVENTION;  Surgeon: Nada Libman, MD;  Location: [facility] INVASIVE CV LAB;  Service: Cardiovascular;  Laterality: Right;    SOCIAL HISTORY: Social History   Socioeconomic History   Marital status: Divorced    Spouse name: Not on file   Number of children: Not on file   Years of education: Not on file   Highest education level: Not on file  Occupational History   Not on file  Tobacco Use   Smoking status: Some Days    Current packs/day: 1.00    Average packs/day: 1 pack/day for 29.0 years (29.0 ttl pk-yrs)    Types: Cigarettes   Smokeless tobacco: Never   Tobacco comments:    Smokes 0.5 PPD 09/07/2022  Vaping Use   Vaping status: Never Used  Substance and Sexual Activity   Alcohol use: Not Currently    Comment: social   Drug use: No   Sexual activity: Yes  Other Topics Concern   Not on file  Social History Narrative   Not on file   Social Drivers of Health   Financial Resource Strain: Not on file  Food Insecurity: Food Insecurity Present (06/30/2023)   Hunger Vital Sign    Worried About Running Out of Food in the Last Year: Often true    Ran Out of Food in the Last Year: Not on file  Transportation Needs: No Transportation Needs (06/30/2023)   PRAPARE - Administrator, Civil Service (Medical): No    Lack of Transportation (Non-Medical): No  Physical  Activity: Not on file  Stress: Not on file  Social Connections: Not on file  Intimate Partner Violence: Not At Risk (06/30/2023)   Humiliation, Afraid, Rape, and Kick questionnaire    Fear of Current or Ex-Partner: No    Emotionally Abused: No    Physically Abused: No    Sexually Abused: No    FAMILY HISTORY: Family History  Problem Relation Age of Onset   Breast cancer Mother 37   Diabetes Father    Congestive Heart Failure Father    Diabetes Brother    Breast cancer Maternal Aunt    Breast cancer Maternal Aunt        Maternal Great Aunt    Diabetes Paternal Aunt    Breast cancer Maternal Grandmother    Diabetes Maternal Grandfather    Breast cancer Cousin 31    ALLERGIES:  has no known allergies.  MEDICATIONS:  Current Outpatient Medications  Medication Sig Dispense Refill   albuterol (VENTOLIN HFA) 108 (90 Base) MCG/ACT inhaler Inhale 2 puffs into the lungs every 6 (six) hours as needed for wheezing or shortness of breath. 8 g 2   apixaban (ELIQUIS) 5 MG TABS tablet Take 1 tablet (5 mg total) by mouth 2 (two) times daily. 60 tablet 4  aspirin EC 81 MG EC tablet Take 1 tablet (81 mg total) by mouth daily. Swallow whole. 90 tablet 3   atorvastatin (LIPITOR) 40 MG tablet Take 1 tablet by mouth once daily 90 tablet 0   calcium carbonate (OSCAL) 1500 (600 Ca) MG TABS tablet Take 600 mg of elemental calcium by mouth 2 (two) times daily with a meal.     citalopram (CELEXA) 40 MG tablet Take 1 tablet (40 mg total) by mouth daily. 90 tablet 3   ferrous gluconate (FERGON) 324 MG tablet Take 1 tablet (324 mg total) by mouth 2 (two) times daily with a meal. (Patient taking differently: Take 324 mg by mouth daily with breakfast.) 60 tablet 3   folic acid (FOLVITE) 400 MCG tablet Take 400 mcg by mouth daily.     gabapentin (NEURONTIN) 300 MG capsule Take 1 capsule (300 mg total) by mouth 3 (three) times daily. 270 capsule 1   linaclotide (LINZESS) 290 MCG CAPS capsule Take 1 capsule  (290 mcg total) by mouth daily before breakfast. 90 capsule 3   Magnesium Citrate 100 MG CAPS Take by mouth.     methotrexate (RHEUMATREX) 10 MG tablet Take 60 mg by mouth once a week. Caution: Chemotherapy. Protect from light.     midodrine (PROAMATINE) 5 MG tablet Take 1 tablet (5 mg total) by mouth 3 (three) times daily with meals. (Patient taking differently: Take 5 mg by mouth daily.) 90 tablet 3   ondansetron (ZOFRAN-ODT) 4 MG disintegrating tablet Take 1 tablet (4 mg total) by mouth every 8 (eight) hours as needed for nausea or vomiting. 90 tablet 1   pantoprazole (PROTONIX) 40 MG tablet Take 1 tablet (40 mg total) by mouth 2 (two) times daily. 60 tablet 2   Pirfenidone 267 MG CAPS Take 1 capsule by mouth in the morning, at noon, and at bedtime.     predniSONE (DELTASONE) 2.5 MG tablet Take 2.5 mg by mouth daily.     EPINEPHrine 0.3 mg/0.3 mL IJ SOAJ injection Inject 0.3 mg into the muscle as needed for anaphylaxis. Follow package instructions as needed for severe allergy or anaphylactic reaction. (Patient not taking: Reported on 06/30/2023) 2 each 3   nicotine (NICODERM CQ - DOSED IN MG/24 HOURS) 21 mg/24hr patch Place 1 patch (21 mg total) onto the skin daily. (Patient not taking: Reported on 06/30/2023) 28 patch 0   No current facility-administered medications for this visit.    Review of Systems  Constitutional:  Positive for fatigue. Negative for appetite change, chills and fever.  HENT:   Negative for hearing loss and voice change.   Eyes:  Negative for eye problems.  Respiratory:  Negative for chest tightness and cough.   Cardiovascular:  Negative for chest pain.  Gastrointestinal:  Negative for abdominal distention, abdominal pain and blood in stool.  Endocrine: Negative for hot flashes.  Genitourinary:  Negative for difficulty urinating and frequency.   Musculoskeletal:  Positive for arthralgias and myalgias.  Skin:  Negative for itching and rash.  Neurological:  Negative for  extremity weakness.  Hematological:  Negative for adenopathy.  Psychiatric/Behavioral:  Negative for confusion.    PHYSICAL EXAMINATION:  Vitals:   06/30/23 1543  BP: 105/72  Pulse: 73  Resp: 18  Temp: 98 F (36.7 C)   Filed Weights   06/30/23 1543  Weight: 142 lb 11.2 oz (64.7 kg)    Physical Exam Constitutional:      General: She is not in acute distress. HENT:  Head: Normocephalic and atraumatic.  Eyes:     General: No scleral icterus. Cardiovascular:     Rate and Rhythm: Normal rate and regular rhythm.  Pulmonary:     Effort: Pulmonary effort is normal. No respiratory distress.     Breath sounds: No wheezing.  Abdominal:     General: Bowel sounds are normal. There is no distension.     Palpations: Abdomen is soft.  Musculoskeletal:     Cervical back: Normal range of motion and neck supple.     Comments: Status post right BKA  Skin:    Findings: No erythema or rash.  Neurological:     Mental Status: She is alert and oriented to person, place, and time. Mental status is at baseline.  Psychiatric:        Mood and Affect: Mood normal.     LABORATORY DATA:  I have reviewed the data as listed    Latest Ref Rng & Units 09/27/2022   11:48 AM 09/25/2022    6:00 PM 09/23/2022   10:46 AM  CBC  WBC 4.0 - 10.5 K/uL 10.1  8.7  9.1   Hemoglobin 12.0 - 15.0 g/dL 56.2  13.0  86.5   Hematocrit 36.0 - 46.0 % 39.5  40.7  41.6   Platelets 150 - 400 K/uL 527  503  581       Latest Ref Rng & Units 09/27/2022   11:48 AM 09/25/2022    6:00 PM 09/23/2022   10:46 AM  CMP  Glucose 70 - 99 mg/dL 98  91  83   BUN 6 - 20 mg/dL 9  13  8    Creatinine 0.44 - 1.00 mg/dL 7.84  6.96  2.95   Sodium 135 - 145 mmol/L 137  138  140   Potassium 3.5 - 5.1 mmol/L 4.2  3.8  4.8   Chloride 98 - 111 mmol/L 102  102  102   CO2 22 - 32 mmol/L 26  25  22    Calcium 8.9 - 10.3 mg/dL 9.1  8.8  9.2   Total Protein 6.5 - 8.1 g/dL 7.5  7.5  6.6   Total Bilirubin 0.3 - 1.2 mg/dL 0.7  0.5  0.2    Alkaline Phos 38 - 126 U/L 93  93  108   AST 15 - 41 U/L 20  22  17    ALT 0 - 44 U/L 16  15  16        RADIOGRAPHIC STUDIES: I have personally reviewed the radiological images as listed and agreed with the findings in the report. VAS [place] LOWER EXTREMITY ARTERIAL DUPLEX Result Date: 06/17/2023 LOWER EXTREMITY ARTERIAL DUPLEX STUDY Patient Name:  JAKALA HERFORD  Date of Exam:   06/15/2023 Medical Rec #: [id number]        Accession #:    [id number] Date of Birth: 06-10-1970        Patient Gender: F Patient Age:   77 years Exam Location:  [facility] Procedure:      VAS [place] LOWER EXTREMITY ARTERIAL DUPLEX Referring Phys: Sheppard Plumber --------------------------------------------------------------------------------   Current ABI: Not Obtained Performing Technologist: Debbe Bales RVS  Examination Guidelines: A complete evaluation includes B-mode imaging, spectral Doppler, color Doppler, and power Doppler as needed of all accessible portions of each vessel. Bilateral testing is considered an integral part of a complete examination. Limited examinations for reoccurring indications may be performed as noted.  +----------+--------+-----+--------+---------+--------+ RIGHT     PSV cm/sRatioStenosisWaveform Comments +----------+--------+-----+--------+---------+--------+ CFA  Distal90                   biphasic          +----------+--------+-----+--------+---------+--------+ DFA       84                   triphasic         +----------+--------+-----+--------+---------+--------+ SFA Prox  53                   triphasic         +----------+--------+-----+--------+---------+--------+ SFA Mid   114                  biphasic          +----------+--------+-----+--------+---------+--------+ SFA Distal44                   biphasic          +----------+--------+-----+--------+---------+--------+ POP Distal23                   biphasic           +----------+--------+-----+--------+---------+--------+ TP Trunk  36                   biphasic          +----------+--------+-----+--------+---------+--------+  Summary: Right: Rt BKA,Arterial flow is Mainly Biphasic from CFA to Popliteal Artery. Cystic structure seen around the knee area with no flow present.  See table(s) above for measurements and observations. Electronically signed by Mikki Alexander MD on 06/17/2023 at 10:02:57 AM.    Final

## 2023-06-30 NOTE — Assessment & Plan Note (Signed)
 Extensive chart review was performed. I discussed with patient that her aortic thrombus was likely manifestation of her ANCA vasculitis. I recommend long-term anticoagulation with Eliquis 5 mg twice daily.  She has tolerated very well and has not developed any recurrence. Her previous hypercoagulable workup is overall negative. Low titer anticardiolipin IgM <40 is of no clinical significance.  I will hold off repeating.

## 2023-06-30 NOTE — Assessment & Plan Note (Signed)
 Continue follow-up with rheumatology for management. Currently she is on weekly methotrexate, daily low-dose prednisone and rituximab every 6 months.

## 2023-07-05 ENCOUNTER — Ambulatory Visit (INDEPENDENT_AMBULATORY_CARE_PROVIDER_SITE_OTHER): Admitting: Nurse Practitioner

## 2023-07-05 ENCOUNTER — Encounter (INDEPENDENT_AMBULATORY_CARE_PROVIDER_SITE_OTHER): Payer: Self-pay | Admitting: Nurse Practitioner

## 2023-07-05 VITALS — BP 105/66 | HR 100 | Resp 18 | Ht 63.0 in | Wt 142.0 lb

## 2023-07-05 DIAGNOSIS — Z89511 Acquired absence of right leg below knee: Secondary | ICD-10-CM

## 2023-07-06 NOTE — Progress Notes (Signed)
 Subjective:    Patient ID: Alexandra [name], female    DOB: [birth year], 53 y.o.   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  She recently fell and broke her femur bone.  This has since healed but she still has a swelling like sensation in her amputation site.  Despite this swelling sensation in her leg is actually not swollen at all.  She will be moving forward with getting her prosthetic refitted soon and wanted to be certain there was no obvious vascular abnormality that would hinder this progress.  Today's studies indicate multiphasic waveforms throughout the right lower extremity and a cystic structure noted near the knee.  There is no flow within the cystic structure.    Review of Systems  All other systems reviewed and are negative.      Objective:   Physical Exam Vitals reviewed.  HENT:     Head: Normocephalic.  Cardiovascular:     Rate and Rhythm: Normal rate.  Pulmonary:     Effort: Pulmonary effort is normal.  Musculoskeletal:     Right Lower Extremity: Right leg is amputated below knee.  Skin:    General: Skin is warm and dry.  Neurological:     Mental Status: She is alert and oriented to person, place, and time.  Psychiatric:        Mood and Affect: Mood normal.        Behavior: Behavior normal.        Thought Content: Thought content normal.        Judgment: Judgment normal.     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)     Social History    Socioeconomic History   Marital status: Divorced    Spouse name: Not on file   Number of children: Not on file   Years of education: Not on file   Highest education level: Not on file  Occupational History   Not on file  Tobacco Use   Smoking status: Some Days    Current packs/day: 1.00    Average packs/day: 1 pack/day for 29.0 years (29.0 ttl pk-yrs)    Types: Cigarettes   Smokeless tobacco: Never   Tobacco comments:    Smokes 0.5 PPD 09/07/2022  Vaping Use   Vaping status: Never Used  Substance and Sexual Activity   Alcohol use: Not Currently    Comment: social   Drug use: No   Sexual activity: Yes  Other Topics Concern   Not on file  Social History Narrative   Not on file   Social Drivers of Health   Financial Resource Strain: Not on file  Food Insecurity: Food Insecurity Present (06/30/2023)   Hunger Vital Sign    Worried About Running Out of Food in the Last Year: Often true    Ran Out of Food in  the Last Year: Not on file  Transportation Needs: No Transportation Needs (06/30/2023)   PRAPARE - Administrator, Civil Service (Medical): No    Lack of Transportation (Non-Medical): No  Physical Activity: Not on file  Stress: Not on file  Social Connections: Not on file  Intimate Partner Violence: Not At Risk (06/30/2023)   Humiliation, Afraid, Rape, and Kick questionnaire    Fear of Current or Ex-Partner: No    Emotionally Abused: No    Physically Abused: No    Sexually Abused: No    Past Surgical History:  Procedure Laterality Date   ABDOMINAL HYSTERECTOMY     AMPUTATION Right 09/12/2020   Procedure: AMPUTATION BELOW KNEE;  Surgeon: Celso College, MD;  Location: [facility] ORS;  Service: Vascular;  Laterality: Right;   BIOPSY  11/10/2022   Procedure: BIOPSY;  Surgeon: Luke Salaam, MD;  Location: [facility] ENDOSCOPY;  Service: Gastroenterology;;   BREAST CYST EXCISION Left 2006   COLONOSCOPY WITH PROPOFOL  N/A 11/10/2022   Procedure: COLONOSCOPY WITH PROPOFOL ;   Surgeon: Luke Salaam, MD;  Location: [facility] ENDOSCOPY;  Service: Gastroenterology;  Laterality: N/A;   ENDOVASCULAR STENT INSERTION N/A 09/08/2020   Procedure: ENDOVASCULAR STENT GRAFT INSERTION;  Surgeon: Celso College, MD;  Location: [facility] ORS;  Service: Vascular;  Laterality: N/A;   ESOPHAGOGASTRODUODENOSCOPY (EGD) WITH PROPOFOL  N/A 11/10/2022   Procedure: ESOPHAGOGASTRODUODENOSCOPY (EGD) WITH PROPOFOL ;  Surgeon: Luke Salaam, MD;  Location: [facility] ENDOSCOPY;  Service: Gastroenterology;  Laterality: N/A;   LOWER EXTREMITY ANGIOGRAPHY Right 09/01/2020   Procedure: Lower Extremity Angiography;  Surgeon: Celso College, MD;  Location: [facility] INVASIVE CV LAB;  Service: Cardiovascular;  Laterality: Right;   LOWER EXTREMITY ANGIOGRAPHY Left 10/01/2020   Procedure: LOWER EXTREMITY ANGIOGRAPHY;  Surgeon: Celso College, MD;  Location: [facility] INVASIVE CV LAB;  Service: Cardiovascular;  Laterality: Left;   LOWER EXTREMITY INTERVENTION Right 09/07/2020   Procedure: LOWER EXTREMITY INTERVENTION;  Surgeon: Margherita Shell, MD;  Location: [facility] INVASIVE CV LAB;  Service: Cardiovascular;  Laterality: Right;    Family History  Problem Relation Age of Onset   Breast cancer Mother 68   Diabetes Father    Congestive Heart Failure Father    Diabetes Brother    Breast cancer Maternal Aunt    Breast cancer Maternal Aunt        Maternal Great Aunt    Diabetes Paternal Aunt    Breast cancer Maternal Grandmother    Diabetes Maternal Grandfather    Breast cancer Cousin 31    No Known Allergies     Latest Ref Rng & Units 09/27/2022   11:48 AM 09/25/2022    6:00 PM 09/23/2022   10:46 AM  CBC  WBC 4.0 - 10.5 K/uL 10.1  8.7  9.1   Hemoglobin 12.0 - 15.0 g/dL 16.1  09.6  04.5   Hematocrit 36.0 - 46.0 % 39.5  40.7  41.6   Platelets 150 - 400 K/uL 527  503  581       CMP     Component Value Date/Time   NA 137 09/27/2022 1148   NA 140 09/23/2022 1046   NA 133 (L) 11/08/2013 1424   K 4.2 09/27/2022 1148   K 3.7  11/08/2013 1424   CL 102 09/27/2022 1148   CL 100 11/08/2013 1424   CO2 26 09/27/2022 1148   CO2 23 11/08/2013 1424   GLUCOSE 98 09/27/2022 1148   GLUCOSE 92 11/08/2013 1424   BUN 9 09/27/2022 1148  BUN 8 09/23/2022 1046   BUN 10 11/08/2013 1424   CREATININE 0.50 09/27/2022 1148   CREATININE 0.73 11/08/2013 1424   CALCIUM  9.1 09/27/2022 1148   CALCIUM  8.3 (L) 11/08/2013 1424   PROT 7.5 09/27/2022 1148   PROT 6.6 09/23/2022 1046   PROT 7.1 09/17/2013 0853   ALBUMIN 3.9 09/27/2022 1148   ALBUMIN 4.1 09/23/2022 1046   ALBUMIN 3.3 (L) 09/17/2013 0853   AST 20 09/27/2022 1148   AST 22 09/17/2013 0853   ALT 16 09/27/2022 1148   ALT 21 09/17/2013 0853   ALKPHOS 93 09/27/2022 1148   ALKPHOS 76 09/17/2013 0853   BILITOT 0.7 09/27/2022 1148   BILITOT 0.2 09/23/2022 1046   BILITOT 0.1 (L) 09/17/2013 0853   EGFR 107 09/23/2022 1046   GFRNONAA >60 09/27/2022 1148   GFRNONAA >60 11/08/2013 1424     No results found.     Assessment & Plan:   1. Hx of right BKA (HCC) (Primary) Studies today indicate that the patient has a small cystic structure in her knee which I suspect is more so of a Baker's cyst.  However if there is no fluid within the cyst and so I do not believe it is a hematoma or anything that would cause the current swelling discomfort that she is having.  I suspect that it is more of a ligament discomfort from the actual break itself.  Based on this I do not see any contraindication to her moving forward with her fitting her new prosthetic after her recent fracture.  Will continue to follow-up with her annually.   Current Outpatient Medications on File Prior to Visit  Medication Sig Dispense Refill   albuterol  (VENTOLIN  HFA) 108 (90 Base) MCG/ACT inhaler Inhale 2 puffs into the lungs every 6 (six) hours as needed for wheezing or shortness of breath. 8 g 2   apixaban  (ELIQUIS ) 5 MG TABS tablet Take 1 tablet (5 mg total) by mouth 2 (two) times daily. 60 tablet 4   aspirin   EC 81 MG EC tablet Take 1 tablet (81 mg total) by mouth daily. Swallow whole. 90 tablet 3   atorvastatin  (LIPITOR) 40 MG tablet Take 1 tablet by mouth once daily 90 tablet 0   Calcium  Carb-Cholecalciferol  (RA CALCIUM  600/VITAMIN D-3) 600-10 MG-MCG TABS RA CALCIUM  600/VITAMIN D-3 600-10 MG-MCG TABS     calcium  carbonate (OSCAL) 1500 (600 Ca) MG TABS tablet Take 600 mg of elemental calcium  by mouth 2 (two) times daily with a meal.     citalopram  (CELEXA ) 40 MG tablet Take 1 tablet (40 mg total) by mouth daily. 90 tablet 3   ferrous gluconate  (FERGON) 324 MG tablet Take 1 tablet (324 mg total) by mouth 2 (two) times daily with a meal. (Patient taking differently: Take 324 mg by mouth daily with breakfast.) 60 tablet 3   folic acid (FOLVITE) 400 MCG tablet Take 400 mcg by mouth daily.     gabapentin  (NEURONTIN ) 300 MG capsule Take 1 capsule (300 mg total) by mouth 3 (three) times daily. 270 capsule 1   linaclotide  (LINZESS ) 290 MCG CAPS capsule Take 1 capsule (290 mcg total) by mouth daily before breakfast. 90 capsule 3   Magnesium  Citrate 100 MG CAPS Take by mouth.     methotrexate (RHEUMATREX) 10 MG tablet Take 60 mg by mouth once a week. Caution: Chemotherapy. Protect from light.     midodrine  (PROAMATINE ) 5 MG tablet Take 1 tablet (5 mg total) by mouth 3 (three) times daily with  meals. (Patient taking differently: Take 5 mg by mouth daily.) 90 tablet 3   ondansetron  (ZOFRAN -ODT) 4 MG disintegrating tablet Take 1 tablet (4 mg total) by mouth every 8 (eight) hours as needed for nausea or vomiting. 90 tablet 1   Pirfenidone 267 MG CAPS Take 1 capsule by mouth in the morning, at noon, and at bedtime.     predniSONE  (DELTASONE ) 2.5 MG tablet Take 2.5 mg by mouth daily.     sulfamethoxazole -trimethoprim  (BACTRIM  DS) 800-160 MG tablet Take 1 tablet by mouth 3 (three) times a week.     EPINEPHrine  0.3 mg/0.3 mL IJ SOAJ injection Inject 0.3 mg into the muscle as needed for anaphylaxis. Follow package  instructions as needed for severe allergy or anaphylactic reaction. (Patient not taking: Reported on 06/30/2023) 2 each 3   nicotine  (NICODERM CQ  - DOSED IN MG/24 HOURS) 21 mg/24hr patch Place 1 patch (21 mg total) onto the skin daily. (Patient not taking: Reported on 07/05/2023) 28 patch 0   pantoprazole  (PROTONIX ) 40 MG tablet Take 1 tablet (40 mg total) by mouth 2 (two) times daily. 60 tablet 2   No current facility-administered medications on file prior to visit.    There are no Patient Instructions on file for this visit. No follow-ups on file.   Clinten Howk E Tajuanna Burnett, NP

## 2023-07-14 ENCOUNTER — Ambulatory Visit

## 2023-07-18 ENCOUNTER — Telehealth: Admitting: Family Medicine

## 2023-07-18 ENCOUNTER — Ambulatory Visit: Payer: Self-pay

## 2023-07-18 DIAGNOSIS — J208 Acute bronchitis due to other specified organisms: Secondary | ICD-10-CM | POA: Diagnosis not present

## 2023-07-18 DIAGNOSIS — B9689 Other specified bacterial agents as the cause of diseases classified elsewhere: Secondary | ICD-10-CM | POA: Diagnosis not present

## 2023-07-18 MED ORDER — PREDNISONE 20 MG PO TABS: 40.0000 mg | ORAL_TABLET | Freq: Every day | ORAL | 0 refills | Status: DC

## 2023-07-18 MED ORDER — ALBUTEROL SULFATE HFA 108 (90 BASE) MCG/ACT IN AERS: 1.0000 | INHALATION_SPRAY | Freq: Four times a day (QID) | RESPIRATORY_TRACT | 0 refills | Status: DC | PRN

## 2023-07-18 MED ORDER — FLUTICASONE PROPIONATE HFA 44 MCG/ACT IN AERO: 1.0000 | INHALATION_SPRAY | Freq: Every day | RESPIRATORY_TRACT | 0 refills | Status: DC

## 2023-07-18 MED ORDER — PROMETHAZINE-DM 6.25-15 MG/5ML PO SYRP: 5.0000 mL | ORAL_SOLUTION | Freq: Four times a day (QID) | ORAL | 0 refills | Status: DC | PRN

## 2023-07-18 MED ORDER — AZITHROMYCIN 250 MG PO TABS: ORAL_TABLET | ORAL | 0 refills | Status: DC

## 2023-07-18 NOTE — Patient Instructions (Addendum)
 Kizzie Perks, thank you for joining Lanetta Pion, NP for today's virtual visit.  While this provider is not your primary care provider (PCP), if your PCP is located in our provider database this encounter information will be shared with them immediately following your visit.   A [facility] MyChart account gives you access to today's visit and all your visits, tests, and labs performed at [facility] " click here if you don't have a [facility] MyChart account or go to [email or web address]  Consent: (Patient) Alexandra [name] provided verbal consent for this virtual visit at the beginning of the encounter.  Current Medications:  Current Outpatient Medications:    albuterol  (VENTOLIN  HFA) 108 (90 Base) MCG/ACT inhaler, Inhale 2 puffs into the lungs every 6 (six) hours as needed for wheezing or shortness of breath., Disp: 8 g, Rfl: 2   apixaban  (ELIQUIS ) 5 MG TABS tablet, Take 1 tablet (5 mg total) by mouth 2 (two) times daily., Disp: 60 tablet, Rfl: 4   aspirin  EC 81 MG EC tablet, Take 1 tablet (81 mg total) by mouth daily. Swallow whole., Disp: 90 tablet, Rfl: 3   atorvastatin  (LIPITOR) 40 MG tablet, Take 1 tablet by mouth once daily, Disp: 90 tablet, Rfl: 0   Calcium  Carb-Cholecalciferol  (RA CALCIUM  600/VITAMIN D-3) 600-10 MG-MCG TABS, RA CALCIUM  600/VITAMIN D-3 600-10 MG-MCG TABS, Disp: , Rfl:    calcium  carbonate (OSCAL) 1500 (600 Ca) MG TABS tablet, Take 600 mg of elemental calcium  by mouth 2 (two) times daily with a meal., Disp: , Rfl:    citalopram  (CELEXA ) 40 MG tablet, Take 1 tablet (40 mg total) by mouth daily., Disp: 90 tablet, Rfl: 3   EPINEPHrine  0.3 mg/0.3 mL IJ SOAJ injection, Inject 0.3 mg into the muscle as needed for anaphylaxis. Follow package instructions as needed for severe allergy or anaphylactic reaction. (Patient not taking: Reported on 06/30/2023), Disp: 2 each, Rfl: 3   ferrous gluconate  (FERGON) 324 MG tablet, Take 1 tablet (324 mg total) by mouth 2  (two) times daily with a meal. (Patient taking differently: Take 324 mg by mouth daily with breakfast.), Disp: 60 tablet, Rfl: 3   folic acid (FOLVITE) 400 MCG tablet, Take 400 mcg by mouth daily., Disp: , Rfl:    gabapentin  (NEURONTIN ) 300 MG capsule, Take 1 capsule (300 mg total) by mouth 3 (three) times daily., Disp: 270 capsule, Rfl: 1   linaclotide  (LINZESS ) 290 MCG CAPS capsule, Take 1 capsule (290 mcg total) by mouth daily before breakfast., Disp: 90 capsule, Rfl: 3   Magnesium  Citrate 100 MG CAPS, Take by mouth., Disp: , Rfl:    methotrexate (RHEUMATREX) 10 MG tablet, Take 60 mg by mouth once a week. Caution: Chemotherapy. Protect from light., Disp: , Rfl:    midodrine  (PROAMATINE ) 5 MG tablet, Take 1 tablet (5 mg total) by mouth 3 (three) times daily with meals. (Patient taking differently: Take 5 mg by mouth daily.), Disp: 90 tablet, Rfl: 3   nicotine  (NICODERM CQ  - DOSED IN MG/24 HOURS) 21 mg/24hr patch, Place 1 patch (21 mg total) onto the skin daily. (Patient not taking: Reported on 07/05/2023), Disp: 28 patch, Rfl: 0   ondansetron  (ZOFRAN -ODT) 4 MG disintegrating tablet, Take 1 tablet (4 mg total) by mouth every 8 (eight) hours as needed for nausea or vomiting., Disp: 90 tablet, Rfl: 1   pantoprazole  (PROTONIX ) 40 MG tablet, Take 1 tablet (40 mg total) by mouth 2 (two) times daily., Disp: 60 tablet, Rfl: 2   Pirfenidone  267 MG CAPS, Take 1 capsule by mouth in the morning, at noon, and at bedtime., Disp: , Rfl:    sulfamethoxazole -trimethoprim  (BACTRIM  DS) 800-160 MG tablet, Take 1 tablet by mouth 3 (three) times a week., Disp: , Rfl:    Medications ordered in this encounter:  No orders of the defined types were placed in this encounter.    *If you need refills on other medications prior to your next appointment, please contact your pharmacy*  Follow-Up: Call back or seek an in-person evaluation if the symptoms worsen or if the condition fails to improve as anticipated.  [facility]  Virtual Care [phone]  Other Instructions   - Take meds as prescribed - Rest voice - Use a cool mist humidifier  - Use saline nose sprays frequently to help soothe nasal passages if they are drying out. - Stay hydrated by drinking plenty of fluids - Keep thermostat turn down low to prevent drying out which can cause a dry cough. - For fever or aches or pains- take tylenol  or ibuprofen  as directed on bottle             * for fevers greater than 101 orally you may alternate ibuprofen  and tylenol  every 3 hours.  If you do not improve you will need a follow up visit in person.    If you have been instructed to have an in-person evaluation today at a local Urgent Care facility, please use the link below. It will take you to a list of all of our available [facility], including address, phone number and hours of operation. Please do not delay care.  [facility]  If you or a family member do not have a primary care provider, use the link below to schedule a visit and establish care. When you choose a [facility] primary care physician or advanced practice provider, you gain a long-term partner in health. Find a Primary Care Provider  Learn more about [facility]'s in-office and virtual care options: [facility] - Get Care Now

## 2023-07-18 NOTE — Telephone Encounter (Signed)
  Chief Complaint: cough Symptoms: wheezing, chest pain Frequency: last week Pertinent Negatives: Patient denies SOB Disposition: [] ED /[] Urgent Care (no appt availability in office) / [] Appointment(In office/virtual)/ [x]  [facility]/ [] Home Care/ [] Refused Recommended Disposition /[] [facility]/ []  Follow-up with PCP Additional Notes: no openings until tomorrow Copied from CRM [id number]. Topic: Clinical - Red Word Triage >> Jul 18, 2023  8:03 AM Oddis Bench wrote: Red Word that prompted transfer to Nurse Triage: Patient is calling she is stating that she got a head cold that has moved to chest and she has pulmonary fibrosis and she is weezing, has been having issue with breathing. Reason for Disposition  Wheezing is present  Answer Assessment - Initial Assessment Questions 1. ONSET: "When did the cough begin?"      Last week  2. SEVERITY: "How bad is the cough today?"      constant 3. SPUTUM: "Describe the color of your sputum" (none, dry cough; clear, white, yellow, green)     no 4. HEMOPTYSIS: "Are you coughing up any blood?" If so ask: "How much?" (flecks, streaks, tablespoons, etc.)     no 5. DIFFICULTY BREATHING: "Are you having difficulty breathing?" If Yes, ask: "How bad is it?" (e.g., mild, moderate, severe)    - MILD: No SOB at rest, mild SOB with walking, speaks normally in sentences, can lie down, no retractions, pulse < 100.    - MODERATE: SOB at rest, SOB with minimal exertion and prefers to sit, cannot lie down flat, speaks in phrases, mild retractions, audible wheezing, pulse 100-120.    - SEVERE: Very SOB at rest, speaks in single words, struggling to breathe, sitting hunched forward, retractions, pulse > 120      no 6. FEVER: "Do you have a fever?" If Yes, ask: "What is your temperature, how was it measured, and when did it start?"     no 8. LUNG HISTORY: "Do you have any history of lung disease?"  (e.g., pulmonary embolus, asthma, emphysema)      Pulmonary fibrosis 10. OTHER SYMPTOMS: "Do you have any other symptoms?" (e.g., runny nose, wheezing, chest pain)       Wheezing under right breast with cough  Protocols used: Cough - Acute Non-Productive-A-AH

## 2023-07-18 NOTE — Progress Notes (Signed)
 Virtual Visit Consent   Alexandra [name], you are scheduled for a virtual visit with a [facility] provider today. Just as with appointments in the office, your consent must be obtained to participate. Your consent will be active for this visit and any virtual visit you may have with one of our providers in the next 365 days. If you have a MyChart account, a copy of this consent can be sent to you electronically.  As this is a virtual visit, video technology does not allow for your provider to perform a traditional examination. This may limit your provider's ability to fully assess your condition. If your provider identifies any concerns that need to be evaluated in person or the need to arrange testing (such as labs, EKG, etc.), we will make arrangements to do so. Although advances in technology are sophisticated, we cannot ensure that it will always work on either your end or our end. If the connection with a video visit is poor, the visit may have to be switched to a telephone visit. With either a video or telephone visit, we are not always able to ensure that we have a secure connection.  By engaging in this virtual visit, you consent to the provision of healthcare and authorize for your insurance to be billed (if applicable) for the services provided during this visit. Depending on your insurance coverage, you may receive a charge related to this service.  I need to obtain your verbal consent now. Are you willing to proceed with your visit today? Alexandra [name] has provided verbal consent on 07/18/2023 for a virtual visit (video or telephone). Alexandra Pion, NP  Date: 07/18/2023 11:14 AM   Virtual Visit via Video Note   I, Alexandra [name], connected with  Alexandra [name]  ([id number], 12/22/1970) on 07/18/23 at 11:15 AM EDT by a video-enabled telemedicine application and verified that I am speaking with the correct person using two identifiers.  Location: Patient: Virtual Visit Location Patient:  Home Provider: Virtual Visit Location Provider: Home Office   I discussed the limitations of evaluation and management by telemedicine and the availability of in person appointments. The patient expressed understanding and agreed to proceed.    History of Present Illness: Alexandra [name] is a 53 y.o. who identifies as a female who was assigned female at birth, and is being seen today for cold symptoms  Onset was 8 days ago- thought it was just a head cold- was waiting it out- but now is having wheezing and coughing because it is in chest area and she can't get it to stop- deep breathing is causing her to cough, mucus will not move.  Associated symptoms are runny nose, fever last week of 101, and chest discomfort. 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 has pulm fibrosis and she feels like it is flaring up her symptoms Modifying factors are cold and flu and tussin cough syrup Denies chest pain, shortness of breath, fevers, chills   Problems:  Patient Active Problem List   Diagnosis Date Noted   Swelling of joint of right knee 05/09/2023   Nausea 11/24/2022   Melena 11/10/2022   Chronic anticoagulation 10/20/2022   Rheumatoid arthritis (HCC) 09/09/2022   Family history of breast cancer 09/09/2022   Prediabetes 09/09/2022   Chronic constipation 09/09/2022   Hypotension 09/09/2022   Status post below-knee amputation of right lower extremity (HCC) 09/09/2022   Pulmonary fibrosis (HCC) 09/07/2022   Ischemic leg 10/01/2020   Atherosclerosis of native arteries of extremity with  rest pain (HCC) 09/26/2020   Vasculitic leg ulcer (HCC)    Right leg swelling    Aortic thrombus (HCC) 09/07/2020   Ischemic pain of right foot: Concern for/probable 09/07/2020   Foot pain, right 09/06/2020   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   Iron  deficiency anemia    Thrombocytosis    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   Foot pain, bilateral 07/26/2020   Radiculopathy affecting upper extremity 02/16/2016    Protrusion of cervical intervertebral disc 02/16/2016   Recurrent depression (HCC) 02/16/2016   Major depressive disorder with single episode, 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 Swallow whole., Disp: 90 tablet, Rfl: 3   atorvastatin  (LIPITOR) 40 MG tablet, Take 1 tablet by mouth once daily, Disp: 90 tablet, Rfl: 0   Calcium  Carb-Cholecalciferol  (RA CALCIUM  600/VITAMIN D-3) 600-10 MG-MCG TABS, RA CALCIUM  600/VITAMIN D-3 600-10 MG-MCG TABS, Disp: , Rfl:    calcium  carbonate (OSCAL) 1500 (600 Ca) MG TABS tablet, Take 600 mg of elemental calcium  by mouth 2 (two) times daily with a meal., Disp: , Rfl:    citalopram  (CELEXA ) 40 MG tablet, Take 1 tablet (40 mg total) by mouth daily., Disp: 90 tablet, Rfl: 3   EPINEPHrine  0.3 mg/0.3 mL IJ SOAJ injection, Inject 0.3 mg into the muscle as needed for anaphylaxis. Follow package instructions as needed for severe allergy or anaphylactic reaction. (Patient not taking: Reported on 06/30/2023), Disp: 2 each, Rfl: 3   ferrous gluconate  (FERGON) 324 MG tablet, Take 1 tablet (324 mg total) by mouth 2 (two) times daily with a meal. (Patient taking differently: Take 324 mg by mouth daily with breakfast.), Disp: 60 tablet, Rfl: 3   folic acid (FOLVITE) 400 MCG tablet, Take 400 mcg by mouth daily., Disp: , Rfl:    gabapentin  (NEURONTIN ) 300 MG capsule, Take 1 capsule (300 mg total) by mouth 3 (three) times daily., Disp: 270 capsule, Rfl: 1   linaclotide  (LINZESS ) 290 MCG CAPS capsule, Take 1 capsule  (290 mcg total) by mouth daily before breakfast., Disp: 90 capsule, Rfl: 3   Magnesium  Citrate 100 MG CAPS, Take by mouth., Disp: , Rfl:    methotrexate (RHEUMATREX) 10 MG tablet, Take 60 mg by mouth once a week. Caution: Chemotherapy. Protect from light., Disp: , Rfl:    midodrine  (PROAMATINE ) 5 MG tablet, Take 1 tablet (5 mg total) by mouth 3 (three) times daily with meals. (Patient taking differently: Take 5 mg by mouth daily.), Disp: 90 tablet, Rfl: 3   nicotine  (NICODERM CQ  - DOSED IN MG/24 HOURS) 21 mg/24hr patch, Place 1 patch (21 mg total) onto the skin daily. (Patient not taking: Reported on 07/05/2023), Disp: 28 patch, Rfl: 0   ondansetron  (ZOFRAN -ODT) 4 MG disintegrating tablet, Take 1 tablet (4 mg total) by mouth every 8 (eight) hours as needed for nausea or vomiting., Disp: 90 tablet, Rfl: 1   pantoprazole  (PROTONIX ) 40 MG tablet, Take 1 tablet (40 mg total) by mouth 2 (two) times daily., Disp:  60 tablet, Rfl: 2   Pirfenidone 267 MG CAPS, Take 1 capsule by mouth in the morning, at noon, and at bedtime., Disp: , Rfl:    predniSONE  (DELTASONE ) 2.5 MG tablet, Take 2.5 mg by mouth daily., Disp: , Rfl:    sulfamethoxazole -trimethoprim  (BACTRIM  DS) 800-160 MG tablet, Take 1 tablet by mouth 3 (three) times a week., Disp: , Rfl:   Observations/Objective: Patient is well-developed, well-nourished in no acute distress.  Resting comfortably  at home.  Head is normocephalic, atraumatic.  No labored breathing.  Speech is clear and coherent with logical content.  Patient is alert and oriented at baseline.  Cough and congestion noted  Assessment and Plan:  1. Acute bacterial bronchitis (Primary)  - predniSONE  (DELTASONE ) 20 MG tablet; Take 2 tablets (40 mg total) by mouth daily with breakfast for 5 days.  Dispense: 10 tablet; Refill: 0 - azithromycin  (ZITHROMAX ) 250 MG tablet; Take 2 tablets on day 1, then 1 tablet daily on days 2 through 5  Dispense: 6 tablet; Refill: 0 -  promethazine -dextromethorphan (PROMETHAZINE -DM) 6.25-15 MG/5ML syrup; Take 5 mLs by mouth 4 (four) times daily as needed for cough.  Dispense: 118 mL; Refill: 0 - fluticasone (FLOVENT HFA) 44 MCG/ACT inhaler; Inhale 1 puff into the lungs daily.  Dispense: 1 each; Refill: 0 - albuterol  (VENTOLIN  HFA) 108 (90 Base) MCG/ACT inhaler; Inhale 1-2 puffs into the lungs every 6 (six) hours as needed for wheezing or shortness of breath (cough).  Dispense: 8 g; Refill: 0   - Take meds as prescribed - Rest voice - Use a cool mist humidifier  - Use saline nose sprays frequently to help soothe nasal passages if they are drying out. - Stay hydrated by drinking plenty of fluids - Keep thermostat turn down low to prevent drying out which can cause a dry cough. - For fever or aches or pains- take tylenol  or ibuprofen  as directed on bottle             * for fevers greater than 101 orally you may alternate ibuprofen  and tylenol  every 3 hours.  If you do not improve you will need a follow up visit in person.               Reviewed side effects, risks and benefits of medication.    Patient acknowledged agreement and understanding of the plan.   Past Medical, Surgical, Social History, Allergies, and Medications have been Reviewed.    Follow Up Instructions: I discussed the assessment and treatment plan with the patient. The patient was provided an opportunity to ask questions and all were answered. The patient agreed with the plan and demonstrated an understanding of the instructions.  A copy of instructions were sent to the patient via MyChart unless otherwise noted below.    The patient was advised to call back or seek an in-person evaluation if the symptoms worsen or if the condition fails to improve as anticipated.    Alexandra Pion, NP

## 2023-07-22 ENCOUNTER — Other Ambulatory Visit: Payer: Self-pay | Admitting: Family Medicine

## 2023-07-22 NOTE — Telephone Encounter (Unsigned)
 Copied from CRM [id number]. Topic: Clinical - Medication Refill >> Jul 22, 2023  8:34 AM Marissa P wrote: Medication: atorvastatin  (LIPITOR) 40 MG tablet  Has the patient contacted their pharmacy? Yes (Agent: If no, request that the patient contact the pharmacy for the refill. If patient does not wish to contact the pharmacy document the reason why and proceed with request.) (Agent: If yes, when and what did the pharmacy advise?)  This is the patient's preferred pharmacy:  [id number] [place], [place] - [address] AT [facility] OF [address] [place] [address] [place] [address] Phone: [phone] Fax: [phone]  Is this the correct pharmacy for this prescription? Yes If no, delete pharmacy and type the correct one.   Has the prescription been filled recently? Yes  Is the patient out of the medication? Yes  Has the patient been seen for an appointment in the last year OR does the patient have an upcoming appointment? Yes  Can we respond through MyChart? Yes  Agent: Please be advised that Rx refills may take up to 3 business days. We ask that you follow-up with your pharmacy.

## 2023-07-23 ENCOUNTER — Emergency Department

## 2023-07-23 ENCOUNTER — Inpatient Hospital Stay
Admission: EM | Admit: 2023-07-23 | Discharge: 2023-07-26 | DRG: 871 | Disposition: A | Discharge status: Home / Self Care | Attending: Student | Admitting: Student

## 2023-07-23 ENCOUNTER — Other Ambulatory Visit: Payer: Self-pay

## 2023-07-23 ENCOUNTER — Encounter: Payer: Self-pay | Admitting: Emergency Medicine

## 2023-07-23 DIAGNOSIS — Z7901 Long term (current) use of anticoagulants: Secondary | ICD-10-CM | POA: Diagnosis not present

## 2023-07-23 DIAGNOSIS — Z90710 Acquired absence of both cervix and uterus: Secondary | ICD-10-CM

## 2023-07-23 DIAGNOSIS — F17210 Nicotine dependence, cigarettes, uncomplicated: Secondary | ICD-10-CM | POA: Diagnosis present

## 2023-07-23 DIAGNOSIS — J439 Emphysema, unspecified: Secondary | ICD-10-CM | POA: Diagnosis present

## 2023-07-23 DIAGNOSIS — J84112 Idiopathic pulmonary fibrosis: Secondary | ICD-10-CM | POA: Diagnosis present

## 2023-07-23 DIAGNOSIS — I7782 Antineutrophilic cytoplasmic antibody (ANCA) vasculitis: Secondary | ICD-10-CM | POA: Diagnosis present

## 2023-07-23 DIAGNOSIS — J189 Pneumonia, unspecified organism: Secondary | ICD-10-CM | POA: Diagnosis not present

## 2023-07-23 DIAGNOSIS — Z720 Tobacco use: Secondary | ICD-10-CM | POA: Diagnosis present

## 2023-07-23 DIAGNOSIS — M0510 Rheumatoid lung disease with rheumatoid arthritis of unspecified site: Secondary | ICD-10-CM | POA: Diagnosis present

## 2023-07-23 DIAGNOSIS — J440 Chronic obstructive pulmonary disease with acute lower respiratory infection: Secondary | ICD-10-CM | POA: Diagnosis present

## 2023-07-23 DIAGNOSIS — Z79899 Other long term (current) drug therapy: Secondary | ICD-10-CM

## 2023-07-23 DIAGNOSIS — J159 Unspecified bacterial pneumonia: Principal | ICD-10-CM | POA: Diagnosis present

## 2023-07-23 DIAGNOSIS — D849 Immunodeficiency, unspecified: Secondary | ICD-10-CM | POA: Diagnosis present

## 2023-07-23 DIAGNOSIS — A419 Sepsis, unspecified organism: Secondary | ICD-10-CM | POA: Diagnosis present

## 2023-07-23 DIAGNOSIS — Z79631 Long term (current) use of antimetabolite agent: Secondary | ICD-10-CM

## 2023-07-23 DIAGNOSIS — Z89511 Acquired absence of right leg below knee: Secondary | ICD-10-CM | POA: Diagnosis present

## 2023-07-23 DIAGNOSIS — Z7982 Long term (current) use of aspirin: Secondary | ICD-10-CM

## 2023-07-23 DIAGNOSIS — K2960 Other gastritis without bleeding: Secondary | ICD-10-CM | POA: Diagnosis present

## 2023-07-23 DIAGNOSIS — K5909 Other constipation: Secondary | ICD-10-CM | POA: Diagnosis present

## 2023-07-23 DIAGNOSIS — J849 Interstitial pulmonary disease, unspecified: Secondary | ICD-10-CM | POA: Diagnosis not present

## 2023-07-23 DIAGNOSIS — F339 Major depressive disorder, recurrent, unspecified: Secondary | ICD-10-CM | POA: Diagnosis present

## 2023-07-23 DIAGNOSIS — Z7952 Long term (current) use of systemic steroids: Secondary | ICD-10-CM

## 2023-07-23 DIAGNOSIS — Z833 Family history of diabetes mellitus: Secondary | ICD-10-CM

## 2023-07-23 DIAGNOSIS — Z86718 Personal history of other venous thrombosis and embolism: Secondary | ICD-10-CM

## 2023-07-23 DIAGNOSIS — Z8249 Family history of ischemic heart disease and other diseases of the circulatory system: Secondary | ICD-10-CM

## 2023-07-23 DIAGNOSIS — M069 Rheumatoid arthritis, unspecified: Secondary | ICD-10-CM | POA: Diagnosis present

## 2023-07-23 DIAGNOSIS — Z716 Tobacco abuse counseling: Secondary | ICD-10-CM | POA: Diagnosis not present

## 2023-07-23 DIAGNOSIS — I739 Peripheral vascular disease, unspecified: Secondary | ICD-10-CM | POA: Diagnosis present

## 2023-07-23 DIAGNOSIS — Z9189 Other specified personal risk factors, not elsewhere classified: Secondary | ICD-10-CM

## 2023-07-23 DIAGNOSIS — M313 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: Secondary | ICD-10-CM | POA: Diagnosis present

## 2023-07-23 DIAGNOSIS — F32A Depression, unspecified: Secondary | ICD-10-CM | POA: Diagnosis present

## 2023-07-23 DIAGNOSIS — E876 Hypokalemia: Secondary | ICD-10-CM | POA: Diagnosis not present

## 2023-07-23 DIAGNOSIS — Z803 Family history of malignant neoplasm of breast: Secondary | ICD-10-CM

## 2023-07-23 DIAGNOSIS — J8410 Pulmonary fibrosis, unspecified: Secondary | ICD-10-CM | POA: Diagnosis present

## 2023-07-23 LAB — COMPREHENSIVE METABOLIC PANEL WITH GFR
ALT: 21 U/L (ref 0–44)
AST: 18 U/L (ref 15–41)
Albumin: 3.5 g/dL (ref 3.5–5.0)
Alkaline Phosphatase: 78 U/L (ref 38–126)
Anion gap: 13 (ref 5–15)
BUN: 14 mg/dL (ref 6–20)
CO2: 24 mmol/L (ref 22–32)
Calcium: 9 mg/dL (ref 8.9–10.3)
Chloride: 101 mmol/L (ref 98–111)
Creatinine, Ser: 0.65 mg/dL (ref 0.44–1.00)
GFR, Estimated: >60 mL/min (ref >60)
Glucose, Bld: 88 mg/dL (ref 70–99)
Potassium: 3.8 mmol/L (ref 3.5–5.1)
Sodium: 138 mmol/L (ref 135–145)
Total Bilirubin: 0.4 mg/dL (ref 0.0–1.2)
Total Protein: 7.1 g/dL (ref 6.5–8.1)

## 2023-07-23 LAB — CBC WITH DIFFERENTIAL/PLATELET
Abs Immature Granulocytes: 0.07 10*3/uL (ref 0.00–0.07)
Basophils Absolute: 0.1 10*3/uL (ref 0.0–0.1)
Basophils Relative: 1 %
Eosinophils Absolute: 1.2 10*3/uL — ABNORMAL HIGH (ref 0.0–0.5)
Eosinophils Relative: 8 %
HCT: 40.6 % (ref 36.0–46.0)
Hemoglobin: 13.1 g/dL (ref 12.0–15.0)
Immature Granulocytes: 1 %
Lymphocytes Relative: 18 %
Lymphs Abs: 2.7 10*3/uL (ref 0.7–4.0)
MCH: 28.5 pg (ref 26.0–34.0)
MCHC: 32.3 g/dL (ref 30.0–36.0)
MCV: 88.5 fL (ref 80.0–100.0)
Monocytes Absolute: 1.1 10*3/uL — ABNORMAL HIGH (ref 0.1–1.0)
Monocytes Relative: 7 %
Neutro Abs: 9.6 10*3/uL — ABNORMAL HIGH (ref 1.7–7.7)
Neutrophils Relative %: 65 %
Platelets: 513 10*3/uL — ABNORMAL HIGH (ref 150–400)
RBC: 4.59 MIL/uL (ref 3.87–5.11)
RDW: 14.8 % (ref 11.5–15.5)
WBC: 14.8 10*3/uL — ABNORMAL HIGH (ref 4.0–10.5)
nRBC: 0 % (ref 0.0–0.2)

## 2023-07-23 LAB — URINALYSIS, W/ REFLEX TO CULTURE (INFECTION SUSPECTED)
Bacteria, UA: NONE SEEN
Bilirubin Urine: NEGATIVE
Glucose, UA: NEGATIVE mg/dL
Hgb urine dipstick: NEGATIVE
Ketones, ur: NEGATIVE mg/dL
Leukocytes,Ua: NEGATIVE
Nitrite: NEGATIVE
Protein, ur: NEGATIVE mg/dL
Specific Gravity, Urine: 1.016 (ref 1.005–1.030)
pH: 5 (ref 5.0–8.0)

## 2023-07-23 LAB — LACTIC ACID, PLASMA: Lactic Acid, Venous: 2.1 mmol/L (ref 0.5–1.9)

## 2023-07-23 LAB — RESP PANEL BY RT-PCR (RSV, FLU A&B, COVID)  RVPGX2
Influenza A by PCR: NEGATIVE
Influenza B by PCR: NEGATIVE
Resp Syncytial Virus by PCR: NEGATIVE
SARS Coronavirus 2 by RT PCR: NEGATIVE

## 2023-07-23 LAB — PROTIME-INR
INR: 1.1 (ref 0.8–1.2)
Prothrombin Time: 14.5 s (ref 11.4–15.2)

## 2023-07-23 MED ORDER — IPRATROPIUM-ALBUTEROL 0.5-2.5 (3) MG/3ML IN SOLN
3.0000 mL | Freq: Once | RESPIRATORY_TRACT | Status: AC
  Administered 2023-07-23: 3 mL via RESPIRATORY_TRACT
  Filled 2023-07-23: qty 3

## 2023-07-23 MED ORDER — METHYLPREDNISOLONE SODIUM SUCC 125 MG IJ SOLR
125.0000 mg | Freq: Once | INTRAMUSCULAR | Status: AC
  Administered 2023-07-23: 125 mg via INTRAVENOUS
  Filled 2023-07-23: qty 2

## 2023-07-23 MED ORDER — SODIUM CHLORIDE 0.9 % IV SOLN
2.0000 g | Freq: Once | INTRAVENOUS | Status: AC
  Administered 2023-07-23: 2 g via INTRAVENOUS
  Filled 2023-07-23: qty 20

## 2023-07-23 MED ORDER — ACETAMINOPHEN 500 MG PO TABS
1000.0000 mg | ORAL_TABLET | Freq: Once | ORAL | Status: AC
  Administered 2023-07-23: 1000 mg via ORAL
  Filled 2023-07-23: qty 2

## 2023-07-23 MED ORDER — SODIUM CHLORIDE 0.9 % IV SOLN
500.0000 mg | Freq: Once | INTRAVENOUS | Status: AC
  Administered 2023-07-23: 500 mg via INTRAVENOUS
  Filled 2023-07-23: qty 5

## 2023-07-23 MED ORDER — SODIUM CHLORIDE 0.9 % IV BOLUS
1000.0000 mL | Freq: Once | INTRAVENOUS | Status: AC
  Administered 2023-07-23: 1000 mL via INTRAVENOUS

## 2023-07-23 MED ORDER — NICOTINE 14 MG/24HR TD PT24
14.0000 mg | MEDICATED_PATCH | Freq: Once | TRANSDERMAL | Status: DC
  Administered 2023-07-23: 14 mg via TRANSDERMAL
  Filled 2023-07-23: qty 1

## 2023-07-23 NOTE — ED Provider Notes (Signed)
 [facility] Provider Note    Event Date/Time   First MD Initiated Contact with Patient 07/23/23 2112     (approximate)   History   Shortness of Breath   HPI Alexandra [name] is a 53 y.o. female with history of Wegener's granulomatosis, pulmonary fibrosis, clotting disorder presenting today for shortness of breath.  Patient states she has worsening shortness of breath over the past week.  Initially saw a virtual provider and started on prednisone  and Z-Pak.  She has not had any relief and having ongoing wheezing, cough, congestion.  Also having fever and chills at home.  Denies any specific chest pain aside from when she is coughing.  No abdominal pain.  No leg pain no leg swelling.  Has not missed any doses of her Eliquis .     Physical Exam   Triage Vital Signs: ED Triage Vitals  Encounter Vitals Group     BP 07/23/23 2008 132/65     Systolic BP Percentile --      Diastolic BP Percentile --      Pulse Rate 07/23/23 2008 (!) 107     Resp 07/23/23 2008 (!) 24     Temp 07/23/23 2008 99.7 F (37.6 C)     Temp Source 07/23/23 2008 Oral     SpO2 07/23/23 2008 97 %     Weight 07/23/23 2009 145 lb (65.8 kg)     Height 07/23/23 2114 5\' 2"  (1.575 m)     Head Circumference --      Peak Flow --      Pain Score 07/23/23 2008 8     Pain Loc --      Pain Education --      Exclude from Growth Chart --     Most recent vital signs: Vitals:   07/23/23 2200 07/23/23 2206  BP: (!) 117/58   Pulse: (!) 111   Resp: (!) 28   Temp:  (!) 101.1 F (38.4 C)  SpO2: 99%    Physical Exam: I have reviewed the vital signs and nursing notes. General: Awake, alert, no acute distress.  Nontoxic appearing. Head:  Atraumatic, normocephalic.   ENT:  EOM intact, PERRL. Oral mucosa is pink and moist with no lesions. Neck: Neck is supple with full range of motion, No meningeal signs. Cardiovascular:  RRR, No murmurs. Peripheral pulses palpable and equal  bilaterally. Respiratory:  Symmetrical chest wall expansion.  Wheezing noted throughout all lung fields with diminished breath sounds and crackles in the bases bilaterally. Musculoskeletal:  No cyanosis or edema. Moving extremities with full ROM Abdomen:  Soft, nontender, nondistended. Neuro:  GCS 15, moving all four extremities, interacting appropriately. Speech clear. Psych:  Calm, appropriate.   Skin:  Warm, dry, no rash.    ED Results / Procedures / Treatments   Labs (all labs ordered are listed, but only abnormal results are displayed) Labs Reviewed  LACTIC ACID, PLASMA - Abnormal; Notable for the following components:      Result Value   Lactic Acid, Venous 2.1 (*)    All other components within normal limits  CBC WITH DIFFERENTIAL/PLATELET - Abnormal; Notable for the following components:   WBC 14.8 (*)    Platelets 513 (*)    Neutro Abs 9.6 (*)    Monocytes Absolute 1.1 (*)    Eosinophils Absolute 1.2 (*)    All other components within normal limits  CULTURE, BLOOD (ROUTINE X 2)  CULTURE, BLOOD (ROUTINE X 2)  RESP PANEL  BY RT-PCR (RSV, FLU A&B, COVID)  RVPGX2  COMPREHENSIVE METABOLIC PANEL WITH GFR  PROTIME-INR  LACTIC ACID, PLASMA  URINALYSIS, W/ REFLEX TO CULTURE (INFECTION SUSPECTED)     EKG    RADIOLOGY Independently interpreted x-ray concerning for pneumonia   PROCEDURES:  Critical Care performed: Yes, see critical care procedure note(s)  .Critical Care  Performed by: Kandee Orion, MD Authorized by: Kandee Orion, MD   Critical care provider statement:    Critical care time (minutes):  30   Critical care was necessary to treat or prevent imminent or life-threatening deterioration of the following conditions:  Sepsis   Critical care was time spent personally by me on the following activities:  Development of treatment plan with patient or surrogate, discussions with consultants, evaluation of patient's response to treatment, examination of patient,  ordering and review of laboratory studies, ordering and review of radiographic studies, ordering and performing treatments and interventions, pulse oximetry, re-evaluation of patient's condition and review of old charts   I assumed direction of critical care for this patient from another provider in my specialty: no     Care discussed with: admitting provider      MEDICATIONS ORDERED IN ED: Medications  azithromycin  (ZITHROMAX ) 500 mg in sodium chloride  0.9 % 250 mL IVPB (500 mg Intravenous New Bag/Given 07/23/23 2224)  nicotine  (NICODERM CQ  - dosed in mg/24 hours) patch 14 mg (14 mg Transdermal Patch Applied 07/23/23 2158)  ipratropium-albuterol  (DUONEB) 0.5-2.5 (3) MG/3ML nebulizer solution 3 mL (3 mLs Nebulization Given 07/23/23 2147)  cefTRIAXone  (ROCEPHIN ) 2 g in sodium chloride  0.9 % 100 mL IVPB (0 g Intravenous Stopped 07/23/23 2224)  methylPREDNISolone  sodium succinate (SOLU-MEDROL ) 125 mg/2 mL injection 125 mg (125 mg Intravenous Given 07/23/23 2152)  sodium chloride  0.9 % bolus 1,000 mL (1,000 mLs Intravenous New Bag/Given 07/23/23 2150)  acetaminophen  (TYLENOL ) tablet 1,000 mg (1,000 mg Oral Given 07/23/23 2146)     IMPRESSION / MDM / ASSESSMENT AND PLAN / ED COURSE  I reviewed the triage vital signs and the nursing notes.                              Differential diagnosis includes, but is not limited to, pneumonia, COVID, flu, RSV, COPD/pulmonary fibrosis exacerbation  Patient's presentation is most consistent with acute presentation with potential threat to life or bodily function.  Patient is a 53 year old female presenting today for worsening shortness of breath with wheezing.  Has already undergone a 5-day course of prednisone  and azithromycin  without significant benefit.  On exam here she is tachycardic, tachypneic.  No hypoxia.  White blood cell count elevated at 14.8 and lactic acid of 2.1.  Given 1 L of fluids.  Also having notable wheezing throughout all lung fields with  diminished breath sounds and crackles in the bases.  Suspect failed outpatient treatment of possible pneumonia complicated by exacerbation of her pulmonary fibrosis.  Chest x-ray confirms bacterial pneumonia.  Will admit for sepsis secondary to pneumonia.  The patient is on the cardiac monitor to evaluate for evidence of arrhythmia and/or significant heart rate changes.     FINAL CLINICAL IMPRESSION(S) / ED DIAGNOSES   Final diagnoses:  Bacterial pneumonia  Sepsis, due to unspecified organism, unspecified whether acute organ dysfunction present [facility])     Rx / DC Orders   ED Discharge Orders     None        Note:  This document was prepared  using Conservation officer, historic buildings and may include unintentional dictation errors.   Kandee Orion, MD 07/23/23 2234

## 2023-07-23 NOTE — ED Triage Notes (Addendum)
 Pt in with sob that has been worsening since early this week. Was seen by virtual provider and has been on Prednisone  and Z-pack x 5 days, feels no relief. +wheezing, cough and congestion, hx of pulm fibrosis and Wegener's. Having chills and fever at home

## 2023-07-24 ENCOUNTER — Inpatient Hospital Stay

## 2023-07-24 DIAGNOSIS — K2960 Other gastritis without bleeding: Secondary | ICD-10-CM

## 2023-07-24 DIAGNOSIS — Z7901 Long term (current) use of anticoagulants: Secondary | ICD-10-CM

## 2023-07-24 DIAGNOSIS — A419 Sepsis, unspecified organism: Principal | ICD-10-CM

## 2023-07-24 DIAGNOSIS — J8410 Pulmonary fibrosis, unspecified: Secondary | ICD-10-CM

## 2023-07-24 DIAGNOSIS — J189 Pneumonia, unspecified organism: Secondary | ICD-10-CM

## 2023-07-24 DIAGNOSIS — F339 Major depressive disorder, recurrent, unspecified: Secondary | ICD-10-CM

## 2023-07-24 DIAGNOSIS — Z9189 Other specified personal risk factors, not elsewhere classified: Secondary | ICD-10-CM

## 2023-07-24 LAB — CBC
HCT: 35.7 % — ABNORMAL LOW (ref 36.0–46.0)
Hemoglobin: 11.5 g/dL — ABNORMAL LOW (ref 12.0–15.0)
MCH: 28.7 pg (ref 26.0–34.0)
MCHC: 32.2 g/dL (ref 30.0–36.0)
MCV: 89 fL (ref 80.0–100.0)
Platelets: 435 10*3/uL — ABNORMAL HIGH (ref 150–400)
RBC: 4.01 MIL/uL (ref 3.87–5.11)
RDW: 14.7 % (ref 11.5–15.5)
WBC: 13.2 10*3/uL — ABNORMAL HIGH (ref 4.0–10.5)
nRBC: 0 % (ref 0.0–0.2)

## 2023-07-24 LAB — LACTIC ACID, PLASMA: Lactic Acid, Venous: 3 mmol/L (ref 0.5–1.9)

## 2023-07-24 LAB — HIV ANTIBODY (ROUTINE TESTING W REFLEX): HIV Screen 4th Generation wRfx: NONREACTIVE

## 2023-07-24 MED ORDER — FERROUS GLUCONATE 324 (38 FE) MG PO TABS
324.0000 mg | ORAL_TABLET | Freq: Every day | ORAL | Status: DC
  Administered 2023-07-24 – 2023-07-26 (×2): 324 mg via ORAL
  Filled 2023-07-24 (×4): qty 1

## 2023-07-24 MED ORDER — BUDESONIDE 0.25 MG/2ML IN SUSP
0.2500 mg | Freq: Two times a day (BID) | RESPIRATORY_TRACT | Status: DC
  Administered 2023-07-24 – 2023-07-26 (×5): 0.25 mg via RESPIRATORY_TRACT
  Filled 2023-07-24 (×5): qty 2

## 2023-07-24 MED ORDER — APIXABAN 5 MG PO TABS
5.0000 mg | ORAL_TABLET | Freq: Two times a day (BID) | ORAL | Status: DC
Start: 2023-07-24 — End: 2023-07-26
  Administered 2023-07-24 – 2023-07-26 (×5): 5 mg via ORAL
  Filled 2023-07-24 (×5): qty 1

## 2023-07-24 MED ORDER — VANCOMYCIN HCL 1500 MG/300ML IV SOLN
1500.0000 mg | Freq: Once | INTRAVENOUS | Status: AC
Start: 2023-07-24 — End: 2023-07-24
  Administered 2023-07-24: 1500 mg via INTRAVENOUS
  Filled 2023-07-24: qty 300

## 2023-07-24 MED ORDER — ALBUTEROL SULFATE (2.5 MG/3ML) 0.083% IN NEBU: 2.5000 mg | INHALATION_SOLUTION | RESPIRATORY_TRACT | Status: DC | PRN

## 2023-07-24 MED ORDER — HYDROCODONE-ACETAMINOPHEN 5-325 MG PO TABS
1.0000 | ORAL_TABLET | ORAL | Status: DC | PRN
  Administered 2023-07-24 – 2023-07-25 (×4): 2 via ORAL
  Filled 2023-07-24 (×4): qty 2

## 2023-07-24 MED ORDER — FLUTICASONE PROPIONATE HFA 44 MCG/ACT IN AERO: 1.0000 | INHALATION_SPRAY | Freq: Every day | RESPIRATORY_TRACT | Status: DC

## 2023-07-24 MED ORDER — VANCOMYCIN HCL 1.25 G IV SOLR: 1250.0000 mg | Freq: Once | INTRAVENOUS | Status: DC

## 2023-07-24 MED ORDER — PREDNISONE 20 MG PO TABS
40.0000 mg | ORAL_TABLET | Freq: Every day | ORAL | Status: DC
  Administered 2023-07-24 – 2023-07-26 (×3): 40 mg via ORAL
  Filled 2023-07-24 (×3): qty 2

## 2023-07-24 MED ORDER — SODIUM CHLORIDE 0.9 % IV SOLN
2.0000 g | Freq: Three times a day (TID) | INTRAVENOUS | Status: DC
  Administered 2023-07-24 – 2023-07-26 (×8): 2 g via INTRAVENOUS
  Filled 2023-07-24 (×8): qty 12.5

## 2023-07-24 MED ORDER — VANCOMYCIN HCL 1.25 G IV SOLR
1250.0000 mg | INTRAVENOUS | Status: DC
  Administered 2023-07-25: 1250 mg via INTRAVENOUS
  Filled 2023-07-24: qty 1250
  Filled 2023-07-24: qty 25

## 2023-07-24 MED ORDER — LINACLOTIDE 145 MCG PO CAPS
290.0000 ug | ORAL_CAPSULE | Freq: Every day | ORAL | Status: DC
  Filled 2023-07-24 (×4): qty 2

## 2023-07-24 MED ORDER — GABAPENTIN 100 MG PO CAPS
100.0000 mg | ORAL_CAPSULE | Freq: Every day | ORAL | Status: DC
  Administered 2023-07-24 – 2023-07-25 (×2): 100 mg via ORAL
  Filled 2023-07-24 (×2): qty 1

## 2023-07-24 MED ORDER — ACETAMINOPHEN 325 MG PO TABS
650.0000 mg | ORAL_TABLET | Freq: Four times a day (QID) | ORAL | Status: DC | PRN
  Administered 2023-07-24: 650 mg via ORAL
  Filled 2023-07-24: qty 2

## 2023-07-24 MED ORDER — NICOTINE 21 MG/24HR TD PT24
21.0000 mg | MEDICATED_PATCH | Freq: Every day | TRANSDERMAL | Status: DC
  Administered 2023-07-24 – 2023-07-26 (×3): 21 mg via TRANSDERMAL
  Filled 2023-07-24 (×3): qty 1

## 2023-07-24 MED ORDER — LACTATED RINGERS IV SOLN
150.0000 mL/h | INTRAVENOUS | Status: AC
  Administered 2023-07-24 (×2): 150 mL/h via INTRAVENOUS

## 2023-07-24 MED ORDER — IPRATROPIUM-ALBUTEROL 0.5-2.5 (3) MG/3ML IN SOLN
3.0000 mL | Freq: Four times a day (QID) | RESPIRATORY_TRACT | Status: DC
  Administered 2023-07-24 – 2023-07-26 (×11): 3 mL via RESPIRATORY_TRACT
  Filled 2023-07-24 (×11): qty 3

## 2023-07-24 MED ORDER — ATORVASTATIN CALCIUM 20 MG PO TABS
40.0000 mg | ORAL_TABLET | Freq: Every day | ORAL | Status: DC
  Administered 2023-07-24 – 2023-07-26 (×3): 40 mg via ORAL
  Filled 2023-07-24 (×3): qty 2

## 2023-07-24 MED ORDER — CITALOPRAM HYDROBROMIDE 20 MG PO TABS
40.0000 mg | ORAL_TABLET | Freq: Every day | ORAL | Status: DC
  Administered 2023-07-24 – 2023-07-26 (×3): 40 mg via ORAL
  Filled 2023-07-24 (×3): qty 2

## 2023-07-24 MED ORDER — SULFAMETHOXAZOLE-TRIMETHOPRIM 800-160 MG PO TABS: 1.0000 | ORAL_TABLET | ORAL | Status: DC

## 2023-07-24 MED ORDER — GUAIFENESIN ER 600 MG PO TB12
1200.0000 mg | ORAL_TABLET | Freq: Two times a day (BID) | ORAL | Status: DC
  Administered 2023-07-24 – 2023-07-26 (×6): 1200 mg via ORAL
  Filled 2023-07-24 (×6): qty 2

## 2023-07-24 MED ORDER — ACETAMINOPHEN 650 MG RE SUPP: 650.0000 mg | Freq: Four times a day (QID) | RECTAL | Status: DC | PRN

## 2023-07-24 MED ORDER — PANTOPRAZOLE SODIUM 40 MG PO TBEC
40.0000 mg | DELAYED_RELEASE_TABLET | Freq: Every day | ORAL | Status: DC
  Administered 2023-07-24 – 2023-07-26 (×3): 40 mg via ORAL
  Filled 2023-07-24 (×3): qty 1

## 2023-07-24 MED ORDER — ONDANSETRON HCL 4 MG PO TABS: 4.0000 mg | ORAL_TABLET | Freq: Four times a day (QID) | ORAL | Status: DC | PRN

## 2023-07-24 MED ORDER — ONDANSETRON HCL 4 MG/2ML IJ SOLN: 4.0000 mg | Freq: Four times a day (QID) | INTRAMUSCULAR | Status: DC | PRN

## 2023-07-24 NOTE — Assessment & Plan Note (Signed)
 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 Overall, pt feeling ok. Will check CT high res as this was recommended to be obtained in 2024 but never done. Continue with prednisone  40 mg daily. And nebs.

## 2023-07-24 NOTE — Assessment & Plan Note (Signed)
 07-24-2023 on chronic prednisone  2.5 mg daily.

## 2023-07-24 NOTE — Assessment & Plan Note (Signed)
 07-24-2023 continue celexa .

## 2023-07-24 NOTE — Assessment & Plan Note (Addendum)
 07-24-2023 continue Nicotine  patch

## 2023-07-24 NOTE — Assessment & Plan Note (Signed)
 07-24-2023 present on admission. Sepsis criteria include fever, tachycardia and tachypnea

## 2023-07-24 NOTE — Assessment & Plan Note (Addendum)
 On admission. Sepsis. At risk of severe infection due to chronic immunosuppressants. Pulmonary fibrosis Sepsis criteria include fever, tachycardia and tachypnea Will treat with cefepime and vancomycin  given risk of severe infection and failure of prior antibiotics Sepsis fluids Guaifenesin , DuoNebs every 6 and as needed Holding off on additional steroids in the setting of possible severe infection.  Received methylprednisolone  in the ED and is on chronic low-dose prednisone  flutter valve and incentive spirometer Supplemental oxygen if needed  07-24-2023 continue with cefepime/vanco. 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 Overall, pt feeling ok.  Will check CT high res as this was recommended to be obtained in 2024 but never done. Continue with prednisone  40 mg daily. And nebs.

## 2023-07-24 NOTE — Assessment & Plan Note (Deleted)
 Followed by pulmonology

## 2023-07-24 NOTE — Progress Notes (Signed)
 Pharmacy Antibiotic Note  Alexandra [name] is a 53 y.o. female admitted on 07/23/2023 with sepsis.  Pharmacy has been consulted for Vancomycin  dosing.  Plan: Pt given Vancomycin  1500 mg once. Vancomycin  1250 mg IV Q 24 hrs. Goal AUC 400-550. Expected AUC: 461.5 SCr used: 0.8 (5/10 SCr = 0.65)  Pharmacy will continue to follow and will adjust abx dosing whenever warranted.  Temp (24hrs), Avg:100.4 F (38 C), Min:99.7 F (37.6 C), Max:101.1 F (38.4 C)   Recent Labs  Lab 07/23/23 2013  WBC 14.8*  CREATININE 0.65  LATICACIDVEN 2.1*    Estimated Creatinine Clearance: 71.6 mL/min (by C-G formula based on SCr of 0.65 mg/dL).    No Known Allergies  Antimicrobials this admission: 5/10 Azithromycin  >> x 1 dose 5/10 Ceftriaxone  >> x 1 dose 5/10 Bactrim  DS >> PTA med (3 x week): Hold while on Cefepime/Vanc  5/11 Cefepime >> 5/11 Vancomycin  >>  Microbiology results: 5/10 BCx: Pending  Thank you for allowing pharmacy to be a part of this patient's care.  Coretta Dexter, PharmD, [facility] 07/24/2023 1:36 AM

## 2023-07-24 NOTE — ED Notes (Signed)
 This RN received report from Destiny Acevedo RN and performed care handoff. This RN introduced self to pt. Call light in reach, bed wheels locked, side rails raised, pt updated on plan of care. High fall risk precautions in place. Rounding completed.

## 2023-07-24 NOTE — Assessment & Plan Note (Addendum)
 On admission. History of aortic thrombus s/p thrombectomy and bilateral common iliac artery stent S/p right BKA secondary to critical limb ischemia Continue apixaban , atorvastatin , aspirin   07-24-2023 continue Eliquis  and ASA.

## 2023-07-24 NOTE — Progress Notes (Signed)
 PROGRESS NOTE    Alexandra [name]  ZOX:[id number] DOB: 24-Aug-1970 DOA: 07/23/2023 PCP: Mimi Alt, MD  Subjective: Patient seen and examined.  She states that she got a head cold that settled in her chest last week.  She is immunosuppressed.  She  is on chronic prednisone  2.5 mg daily.  She follows with rheumatology and pulmonology.  Is on room air.  Still wheezing.  Patient states that she does not take Esbriet that was prescribed by pulmonology last year.   Hospital Course: HPI: Alexandra [name] is a 53 y.o. 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  She saw her PCP on 5/5 with an 8-day complaint of cough, congestion and wheezing without fever.  She was treated with a Z-Pak, albuterol  and prednisone  however symptoms of shortness of breath continued to worsen, and she developed fever and chills. ED course and data review: febrile at 101.1 with pulse 111, RR 28 and BP 117/58. O2 sats 99% Labs notable for WBC 15,000 with lactic acid 2.1 Respiratory viral panel negative, UA negative CMP normal EKG, personally viewed and interpreted with sinus tachycardia and nonspecific ST-T wave changes   Chest x-ray showing possible infection "Basilar predominant interstitial lung disease. Slight increased density in the left greater right lower lobe may represent superimposed infection"   Patient started on Rocephin  and azithromycin  also given Solu-Medrol  and DuoNebs and sepsis fluids Hospitalist consulted for admission.   Significant Events: Admitted 07/23/2023 for CAP   Significant Labs: WBC 14.8, HgB 13.1, plt 513 Na  138, K 3.8, CO2 of 24, BUN 14, scr 0.64, glu 88 Lactic acid 2.1 Covid/flu/rsv negative UA negative  Significant Imaging Studies: CXR Basilar predominant interstitial lung disease. Slight increased density in the left greater right lower lobe may represent superimposed infection  Antibiotic Therapy: Anti-infectives (From admission, onward)    Start     Dose/Rate Route Frequency Ordered Stop   07/25/23 0900  sulfamethoxazole -trimethoprim  (BACTRIM  DS) 800-160 MG per tablet 1 tablet  Status:  Discontinued        1 tablet Oral Once per day on Monday Wednesday Friday 07/24/23 0058 07/24/23 0234   07/25/23 0600  Vancomycin  (VANCOCIN ) 1,250 mg in sodium chloride  0.9 % 250 mL IVPB  Status:  Discontinued        1,250 mg 166.7 mL/hr over 90 Minutes Intravenous  Once 07/24/23 0135 07/24/23 0136   07/25/23 0600  Vancomycin  (VANCOCIN ) 1,250 mg in sodium chloride  0.9 % 250 mL IVPB        1,250 mg 166.7 mL/hr over 90 Minutes Intravenous Every 24 hours 07/24/23 0136     07/24/23 0600  ceFEPIme (MAXIPIME) 2 g in sodium chloride  0.9 % 100 mL IVPB        2 g 200 mL/hr over 30 Minutes Intravenous Every 8 hours 07/24/23 0134     07/24/23 0230  vancomycin  (VANCOREADY) IVPB 1500 mg/300 mL        1,500 mg 150 mL/hr over 120 Minutes Intravenous  Once 07/24/23 0134 07/24/23 0504   07/23/23 2145  cefTRIAXone  (ROCEPHIN ) 2 g in sodium chloride  0.9 % 100 mL IVPB        2 g 200 mL/hr over 30 Minutes Intravenous  Once  07/23/23 2130 07/23/23 2224   07/23/23 2145  azithromycin  (ZITHROMAX ) 500 mg in sodium chloride  0.9 % 250 mL IVPB        500 mg 250 mL/hr over 60 Minutes Intravenous  Once 07/23/23 2130 07/23/23 2359       Procedures:   Consultants:     Assessment and Plan: * CAP (community acquired pneumonia) On admission. Sepsis. At risk of severe infection due to chronic immunosuppressants. Pulmonary fibrosis Sepsis criteria include fever, tachycardia and tachypnea Will treat with cefepime and  vancomycin  given risk of severe infection and failure of prior antibiotics Sepsis fluids Guaifenesin , DuoNebs every 6 and as needed Holding off on additional steroids in the setting of possible severe infection.  Received methylprednisolone  in the ED and is on chronic low-dose prednisone  flutter valve and incentive spirometer Supplemental oxygen if needed  07-24-2023 continue with cefepime/vanco. 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 Overall, pt feeling ok.  Will check CT high res as this was recommended to be obtained in 2024 but never done. Continue with prednisone  40 mg daily. And nebs.  Sepsis (HCC) 07-24-2023 present on admission. Sepsis criteria include fever, tachycardia and tachypnea   Pulmonary fibrosis (HCC) 07-24-2023 Pt with known basilar pulmonary fibrosis making CXR findings difficult to interpret. Overall, pt feeling ok. Will check CT high res as this was recommended to be obtained in 2024 but never done. Continue with prednisone  40 mg daily. And nebs.   Erosive gastritis 07-23-2020 Continue PPI  At risk for infection due to immunosuppression 07-24-2023 on chronic prednisone  2.5 mg daily.  Chronic anticoagulation 07-24-2023 on Eliquis  due to prior hx of arterial thrombosis.  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 Pt fractured her femur and is only able to wear her prosthesis 2-3 times a week.  History of arterial thrombosis - aortic thrombus On admission. History of aortic thrombus s/p thrombectomy and bilateral common iliac artery stent S/p right BKA secondary to critical limb ischemia Continue apixaban , atorvastatin , aspirin   07-24-2023 continue Eliquis  and ASA.  ANCA-associated vasculitis (HCC) On admission. 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 Will continue with 40 mg prednisone  daily.  Recurrent depression (HCC) 07-24-2023 continue celexa .  Tobacco use 07-24-2023 continue Nicotine  patch  Chronic constipation Continue Linzess    DVT prophylaxis:  apixaban  (ELIQUIS ) tablet 5 mg     Code Status: Full Code Family Communication: no family at bedside. Pt is decisional. Disposition Plan: return home Reason for continuing need for hospitalization: remains on IV ABX, checking CT chest.  Objective: Vitals:   07/24/23 0641 07/24/23 0700 07/24/23 0800 07/24/23 0900  BP:  113/63  112/68  Pulse:  79 (!) 103 83  Resp:  20 (!) 22 15  Temp: 97.8 F (36.6 C)     TempSrc: Oral     SpO2:  94% 96% 100%  Weight:      Height:        Intake/Output Summary (Last 24 hours) at 07/24/2023 0944 Last data filed at 07/24/2023 0504 Gross per 24 hour  Intake 1650 ml  Output --  Net 1650 ml   Filed Weights   07/23/23 2009 07/23/23 2114  Weight: 65.8 kg 65.8 kg    Examination:  Physical Exam Vitals and nursing note reviewed.  Constitutional:  General: She is not in acute distress.    Appearance: She is normal weight. She is not toxic-appearing or diaphoretic.  HENT:     Head: Normocephalic and atraumatic.     Nose: Nose normal.  Eyes:     General: No scleral icterus. Cardiovascular:     Rate and Rhythm: Normal rate and regular rhythm.  Pulmonary:     Effort: Pulmonary effort is normal. No respiratory distress.     Comments: Musical rhonchi bilaterally Abdominal:     General: Abdomen is flat. Bowel sounds are normal.     Palpations: Abdomen is soft.  Musculoskeletal:     Comments: Right BKA  Skin:    General: Skin is warm and dry.     Capillary Refill: Capillary refill takes less than 2 seconds.  Neurological:     General: No focal deficit present.     Mental Status: She is alert  and oriented to person, place, and time.     Data Reviewed: I have personally reviewed following labs and imaging studies  CBC: Recent Labs  Lab 07/23/23 2013 07/24/23 0333  WBC 14.8* 13.2*  NEUTROABS 9.6*  --   HGB 13.1 11.5*  HCT 40.6 35.7*  MCV 88.5 89.0  PLT 513* 435*   Basic Metabolic Panel: Recent Labs  Lab 07/23/23 2013  NA 138  K 3.8  CL 101  CO2 24  GLUCOSE 88  BUN 14  CREATININE 0.65  CALCIUM  9.0   GFR: Estimated Creatinine Clearance: 71.6 mL/min (by C-G formula based on SCr of 0.65 mg/dL). Liver Function Tests: Recent Labs  Lab 07/23/23 2013  AST 18  ALT 21  ALKPHOS 78  BILITOT 0.4  PROT 7.1  ALBUMIN 3.5   Coagulation Profile: Recent Labs  Lab 07/23/23 2013  INR 1.1   Sepsis Labs: Recent Labs  Lab 07/23/23 2013 07/24/23 0255  LATICACIDVEN 2.1* 3.0*    Recent Results (from the past 240 hours)  Culture, blood (Routine x 2)     Status: None (Preliminary result)   Collection Time: 07/23/23  8:13 PM   Specimen: BLOOD  Result Value Ref Range Status   Specimen Description BLOOD RIGHT ANTECUBITAL  Final   Special Requests   Final    BOTTLES DRAWN AEROBIC AND ANAEROBIC Blood Culture adequate volume   Culture   Final    NO GROWTH < 12 HOURS Performed at [facility], [address], [place], [place] [address]    Report Status PENDING  Incomplete  Resp panel by RT-PCR (RSV, Flu A&B, Covid) Urine, Clean Catch     Status: None   Collection Time: 07/23/23  8:14 PM   Specimen: Urine, Clean Catch; Nasal Swab  Result Value Ref Range Status   SARS Coronavirus 2 by RT PCR NEGATIVE NEGATIVE Final    Comment: (NOTE) SARS-CoV-2 target nucleic acids are NOT DETECTED.  The SARS-CoV-2 RNA is generally detectable in upper respiratory specimens during the acute phase of infection. The lowest concentration of SARS-CoV-2 viral copies this assay can detect is 138 copies/mL. A negative result does not preclude SARS-Cov-2 infection and should  not be used as the sole basis for treatment or other patient management decisions. A negative result may occur with  improper specimen collection/handling, submission of specimen other than nasopharyngeal swab, presence of viral mutation(s) within the areas targeted by this assay, and inadequate number of viral copies(<138 copies/mL). A negative result must be combined with clinical observations, patient history, and epidemiological information. The expected result is  Negative.  Fact Sheet for Patients:  BloggerCourse.com  Fact Sheet for Healthcare Providers:  SeriousBroker.it  This test is no t yet approved or cleared by the [place]  FDA and  has been authorized for detection and/or diagnosis of SARS-CoV-2 by FDA under an Emergency Use Authorization (EUA). This EUA will remain  in effect (meaning this test can be used) for the duration of the COVID-19 declaration under Section 564(b)(1) of the Act, 21 U.S.C.section 360bbb-3(b)(1), unless the authorization is terminated  or revoked sooner.       Influenza A by PCR NEGATIVE NEGATIVE Final   Influenza B by PCR NEGATIVE NEGATIVE Final    Comment: (NOTE) The Xpert Xpress SARS-CoV-2/FLU/RSV plus assay is intended as an aid in the diagnosis of influenza from Nasopharyngeal swab specimens and should not be used as a sole basis for treatment. Nasal washings and aspirates are unacceptable for Xpert Xpress SARS-CoV-2/FLU/RSV testing.  Fact Sheet for Patients: BloggerCourse.com  Fact Sheet for Healthcare Providers: SeriousBroker.it  This test is not yet approved or cleared by the [place]  FDA and has been authorized for detection and/or diagnosis of SARS-CoV-2 by FDA under an Emergency Use Authorization (EUA). This EUA will remain in effect (meaning this test can be used) for the duration of the COVID-19 declaration under  Section 564(b)(1) of the Act, 21 U.S.C. section 360bbb-3(b)(1), unless the authorization is terminated or revoked.     Resp Syncytial Virus by PCR NEGATIVE NEGATIVE Final    Comment: (NOTE) Fact Sheet for Patients: BloggerCourse.com  Fact Sheet for Healthcare Providers: SeriousBroker.it  This test is not yet approved or cleared by the [place]  FDA and has been authorized for detection and/or diagnosis of SARS-CoV-2 by FDA under an Emergency Use Authorization (EUA). This EUA will remain in effect (meaning this test can be used) for the duration of the COVID-19 declaration under Section 564(b)(1) of the Act, 21 U.S.C. section 360bbb-3(b)(1), unless the authorization is terminated or revoked.  Performed at [facility], [address] [place], [place] [address]   Culture, blood (Routine x 2)     Status: None (Preliminary result)   Collection Time: 07/24/23  2:55 AM   Specimen: BLOOD  Result Value Ref Range Status   Specimen Description BLOOD BLOOD RIGHT HAND  Final   Special Requests   Final    BOTTLES DRAWN AEROBIC AND ANAEROBIC Blood Culture results may not be optimal due to an excessive volume of blood received in culture bottles   Culture   Final    NO GROWTH < 12 HOURS Performed at [facility], [address], [place], [place] [address]    Report Status PENDING  Incomplete     Radiology Studies: DG Chest Port 1 View Result Date: 07/23/2023 CLINICAL DATA:  [id number] Suspected sepsis [id number] EXAM: PORTABLE CHEST 1 VIEW COMPARISON:  Radiograph 11/28/22, CT 05/04/22 FINDINGS: Normal heart size and mediastinal contours. Basilar predominant interstitial lung disease. Slight increased density in the left greater right lower lobe may represent superimposed infection. No pleural effusion, pneumothorax or pulmonary edema. IMPRESSION: Basilar predominant interstitial lung disease. Slight increased density in the  left greater right lower lobe may represent superimposed infection. Electronically Signed   By: Chadwick Colonel M.D.   On: 07/23/2023 21:58    Scheduled Meds:  apixaban   5 mg Oral BID   atorvastatin   40 mg Oral Daily   budesonide (PULMICORT) nebulizer solution  0.25 mg Nebulization BID   citalopram   40 mg Oral Daily  ferrous gluconate   324 mg Oral Q breakfast   guaiFENesin   1,200 mg Oral BID   ipratropium-albuterol   3 mL Nebulization Q6H   linaclotide   290 mcg Oral QAC breakfast   nicotine   14 mg Transdermal Once   nicotine   21 mg Transdermal Daily   pantoprazole   40 mg Oral Daily   predniSONE   40 mg Oral Q breakfast   Continuous Infusions:  ceFEPime (MAXIPIME) IV Stopped (07/24/23 0736)   lactated ringers  Stopped (07/24/23 0621)   [START ON 07/25/2023] vancomycin        LOS: 1 day   Time spent: 45 minutes  Unk Garb, DO  [facility]  07/24/2023, 9:44 AM

## 2023-07-24 NOTE — Assessment & Plan Note (Addendum)
 On admission. 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 Will continue with 40 mg prednisone  daily.

## 2023-07-24 NOTE — Assessment & Plan Note (Addendum)
 07-23-2020 Continue PPI

## 2023-07-24 NOTE — Hospital Course (Signed)
 Alexandra [name]

## 2023-07-24 NOTE — H&P (Signed)
 History and Physical    Patient: Alexandra [name]:[id number] DOB: [birth year] DOA: 07/23/2023 DOS: the patient was seen and examined on 07/24/2023 PCP: Mimi Alt, MD  Patient coming from: Home  Chief Complaint:  Chief Complaint  Patient presents with   Shortness of Breath    HPI: Alexandra [name] is a 53 y.o. 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  She saw her PCP on 5/5 with an 8-day complaint of cough, congestion and wheezing without fever.  She was treated with a Z-Pak, albuterol  and prednisone  however symptoms of shortness of breath continued to worsen, and she developed fever and chills. ED course and data review: febrile at 101.1 with pulse 111, RR 28 and BP 117/58. O2 sats 99% Labs notable for WBC 15,000 with lactic acid 2.1 Respiratory viral panel negative, UA negative CMP normal EKG, personally viewed and interpreted with sinus tachycardia and nonspecific ST-T wave changes  Chest x-ray showing possible infection "Basilar predominant interstitial lung disease. Slight increased density in the left greater right lower lobe may represent superimposed infection"  Patient started on Rocephin  and azithromycin  also given Solu-Medrol  and DuoNebs and sepsis fluids Hospitalist consulted for admission.     Review of Systems: As mentioned in the history of present illness. All other systems reviewed and are negative.  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)    Past Surgical History:  Procedure Laterality Date   ABDOMINAL HYSTERECTOMY     AMPUTATION Right 09/12/2020   Procedure: AMPUTATION BELOW KNEE;  Surgeon: Celso College, MD;  Location: [facility] ORS;  Service: Vascular;  Laterality: Right;   BIOPSY  11/10/2022   Procedure: BIOPSY;  Surgeon: Luke Salaam, MD;  Location: [facility] ENDOSCOPY;  Service: Gastroenterology;;   BREAST CYST EXCISION Left 2006   COLONOSCOPY WITH PROPOFOL  N/A 11/10/2022   Procedure: COLONOSCOPY WITH PROPOFOL ;  Surgeon: Luke Salaam, MD;  Location: [facility] ENDOSCOPY;  Service: Gastroenterology;  Laterality: N/A;   ENDOVASCULAR STENT INSERTION N/A 09/08/2020   Procedure: ENDOVASCULAR STENT GRAFT INSERTION;  Surgeon: Celso College, MD;  Location: [facility] ORS;  Service: Vascular;  Laterality: N/A;   ESOPHAGOGASTRODUODENOSCOPY (EGD) WITH PROPOFOL  N/A 11/10/2022   Procedure: ESOPHAGOGASTRODUODENOSCOPY (EGD) WITH PROPOFOL ;  Surgeon: Luke Salaam, MD;  Location: [facility] ENDOSCOPY;  Service: Gastroenterology;  Laterality: N/A;   LOWER EXTREMITY ANGIOGRAPHY Right 09/01/2020   Procedure: Lower Extremity Angiography;  Surgeon: Celso College, MD;  Location: [facility] INVASIVE CV LAB;  Service: Cardiovascular;  Laterality: Right;   LOWER EXTREMITY ANGIOGRAPHY Left 10/01/2020   Procedure: LOWER EXTREMITY ANGIOGRAPHY;  Surgeon: Celso College, MD;  Location: [facility] INVASIVE CV LAB;  Service: Cardiovascular;  Laterality: Left;   LOWER EXTREMITY INTERVENTION Right 09/07/2020   Procedure: LOWER EXTREMITY INTERVENTION;  Surgeon: Margherita Shell, MD;  Location: [facility] INVASIVE CV LAB;  Service: Cardiovascular;  Laterality: Right;   Social History:  reports that she has been smoking cigarettes. She has  a 29 pack-year smoking history. She has never used smokeless tobacco. She reports that she does not currently use alcohol. She reports that she does not use drugs.  No  Known Allergies  Family History  Problem Relation Age of Onset   Breast cancer Mother 8   Diabetes Father    Congestive Heart Failure Father    Diabetes Brother    Breast cancer Maternal Aunt    Breast cancer Maternal Aunt        Maternal Great Aunt    Diabetes Paternal Aunt    Breast cancer Maternal Grandmother    Diabetes Maternal Grandfather    Breast cancer Cousin 31    Prior to Admission medications   Medication Sig Start Date End Date Taking? Authorizing Provider  acetaminophen -codeine (TYLENOL  #3) 300-30 MG tablet Take 1 tablet by mouth every 8 (eight) hours as needed. 06/23/23  Yes [provider]  albuterol  (VENTOLIN  HFA) 108 (90 Base) MCG/ACT inhaler Inhale 1-2 puffs into the lungs every 6 (six) hours as needed for wheezing or shortness of breath (cough). 07/18/23  Yes Lanetta Pion, NP  apixaban  (ELIQUIS ) 5 MG TABS tablet Take 1 tablet (5 mg total) by mouth 2 (two) times daily. 09/29/20  Yes Brown, Fallon E, NP  aspirin  EC 81 MG EC tablet Take 1 tablet (81 mg total) by mouth daily. Swallow whole. 10/02/20  Yes Stegmayer, Irven Manson, PA-C  atorvastatin  (LIPITOR) 40 MG tablet Take 1 tablet by mouth once daily 04/08/22  Yes Brown, Fallon E, NP  citalopram  (CELEXA ) 40 MG tablet Take 1 tablet (40 mg total) by mouth daily. 09/09/22  Yes Trenton Frock, PA-C  promethazine -dextromethorphan (PROMETHAZINE -DM) 6.25-15 MG/5ML syrup Take 5 mLs by mouth 4 (four) times daily as needed for cough. 07/18/23  Yes Lanetta Pion, NP  sulfamethoxazole -trimethoprim  (BACTRIM  DS) 800-160 MG tablet Take 1 tablet by mouth 3 (three) times a week. 06/24/23 06/23/24 Yes [provider]  Calcium  Carb-Cholecalciferol  (RA CALCIUM  600/VITAMIN D-3) 600-10 MG-MCG TABS RA CALCIUM  600/VITAMIN D-3 600-10 MG-MCG TABS 07/20/22   [provider]  calcium  carbonate (OSCAL) 1500 (600 Ca) MG TABS tablet Take 600 mg of elemental calcium  by mouth 2 (two) times daily with a meal.    [provider]  EPINEPHrine  0.3 mg/0.3 mL IJ SOAJ injection Inject 0.3 mg into the muscle as needed for anaphylaxis. Follow package instructions as needed for severe allergy or anaphylactic reaction. Patient not taking: Reported on 06/30/2023 03/19/22   Jacquie Maudlin, MD  ferrous gluconate  [facility]) 324 MG tablet Take 1 tablet (324 mg total) by mouth 2 (two) times daily with a meal. Patient taking differently: Take 324 mg by mouth daily with breakfast. 09/16/20   Melvinia Stager, MD  fluticasone  (FLOVENT  HFA) 44 MCG/ACT inhaler Inhale 1 puff into the lungs daily. 07/18/23   Lanetta Pion, NP  folic acid (FOLVITE) 400 MCG tablet Take 400 mcg by mouth daily.    [provider]  gabapentin  (NEURONTIN ) 300 MG capsule Take 1 capsule (300 mg total) by mouth 3 (three) times daily. 09/09/22   Trenton Frock, PA-C  linaclotide  (LINZESS ) 290 MCG CAPS capsule Take 1 capsule (290 mcg total) by mouth daily before breakfast. 11/23/22 11/18/23  Brigitte Canard, PA-C  Magnesium  Citrate 100 MG CAPS Take by mouth.    [provider]  methotrexate (RHEUMATREX) 10 MG tablet Take 60 mg by mouth once a week. Caution: Chemotherapy. Protect from light.    [provider]  midodrine  (PROAMATINE ) 5 MG  tablet Take 1 tablet (5 mg total) by mouth 3 (three) times daily with meals. Patient taking differently: Take 5 mg by mouth daily. 09/09/22   Gollan, Timothy J, MD  nicotine  (NICODERM CQ  - DOSED IN MG/24 HOURS) 21 mg/24hr patch Place 1 patch (21 mg total) onto the skin daily. Patient not taking: Reported on 06/30/2023 09/16/20   Patel, Sona, MD  ondansetron  (ZOFRAN -ODT) 4 MG disintegrating tablet Take 1 tablet (4 mg total) by mouth every 8 (eight) hours as needed for nausea or vomiting. 11/24/22   Simmons-Robinson, Judyann Number, MD  pantoprazole  (PROTONIX ) 40 MG tablet Take 1 tablet (40 mg total) by mouth 2 (two) times daily. 11/24/22 06/30/23  Brigitte Canard, PA-C  Pirfenidone 267 MG CAPS Take 1 capsule by mouth in the morning, at  noon, and at bedtime.    [provider]    Physical Exam: Vitals:   07/23/23 2009 07/23/23 2114 07/23/23 2200 07/23/23 2206  BP:   (!) 117/58   Pulse:   (!) 111   Resp:   (!) 28   Temp:    (!) 101.1 F (38.4 C)  TempSrc:    Oral  SpO2:   99%   Weight: 65.8 kg 65.8 kg    Height:  5\' 2"  (1.575 m)     Physical Exam Vitals and nursing note reviewed.  Constitutional:      General: She is not in acute distress.    Comments: Patient with continuous congested cough  HENT:     Head: Normocephalic and atraumatic.  Cardiovascular:     Rate and Rhythm: Regular rhythm. Tachycardia present.     Heart sounds: Normal heart sounds.  Pulmonary:     Effort: Tachypnea present.     Breath sounds: Wheezing and rhonchi present.     Comments: Coarse breath sounds Abdominal:     Palpations: Abdomen is soft.     Tenderness: There is no abdominal tenderness.  Musculoskeletal:     Comments: Right BKA  Neurological:     Mental Status: Mental status is at baseline.     Labs on Admission: I have personally reviewed following labs and imaging studies  CBC: Recent Labs  Lab 07/23/23 2013  WBC 14.8*  NEUTROABS 9.6*  HGB 13.1  HCT 40.6  MCV 88.5  PLT 513*   Basic Metabolic Panel: Recent Labs  Lab 07/23/23 2013  NA 138  K 3.8  CL 101  CO2 24  GLUCOSE 88  BUN 14  CREATININE 0.65  CALCIUM  9.0   GFR: Estimated Creatinine Clearance: 71.6 mL/min (by C-G formula based on SCr of 0.65 mg/dL). Liver Function Tests: Recent Labs  Lab 07/23/23 2013  AST 18  ALT 21  ALKPHOS 78  BILITOT 0.4  PROT 7.1  ALBUMIN 3.5   No results for input(s): "LIPASE", "AMYLASE" in the last 168 hours. No results for input(s): "AMMONIA" in the last 168 hours. Coagulation Profile: Recent Labs  Lab 07/23/23 2013  INR 1.1   Cardiac Enzymes: No results for input(s): "CKTOTAL", "CKMB", "CKMBINDEX", "TROPONINI" in the last 168 hours. BNP (last 3 results) No results for input(s): "PROBNP" in  the last 8760 hours. HbA1C: No results for input(s): "HGBA1C" in the last 72 hours. CBG: No results for input(s): "GLUCAP" in the last 168 hours. Lipid Profile: No results for input(s): "CHOL", "HDL", "LDLCALC", "TRIG", "CHOLHDL", "LDLDIRECT" in the last 72 hours. Thyroid Function Tests: No results for input(s): "TSH", "T4TOTAL", "FREET4", "T3FREE", "THYROIDAB" in the last 72 hours. Anemia Panel: No results  for input(s): "VITAMINB12", "FOLATE", "FERRITIN", "TIBC", "IRON ", "RETICCTPCT" in the last 72 hours. Urine analysis:    Component Value Date/Time   COLORURINE YELLOW (A) 07/23/2023 2013   APPEARANCEUR CLEAR (A) 07/23/2023 2013   APPEARANCEUR Clear 09/17/2013 1229   LABSPEC 1.016 07/23/2023 2013   LABSPEC 1.008 09/17/2013 1229   PHURINE 5.0 07/23/2023 2013   GLUCOSEU NEGATIVE 07/23/2023 2013   GLUCOSEU Negative 09/17/2013 1229   HGBUR NEGATIVE 07/23/2023 2013   BILIRUBINUR NEGATIVE 07/23/2023 2013   BILIRUBINUR Negative 09/17/2013 1229   KETONESUR NEGATIVE 07/23/2023 2013   PROTEINUR NEGATIVE 07/23/2023 2013   NITRITE NEGATIVE 07/23/2023 2013   LEUKOCYTESUR NEGATIVE 07/23/2023 2013   LEUKOCYTESUR Negative 09/17/2013 1229    Radiological Exams on Admission: DG Chest Port 1 View Result Date: 07/23/2023 CLINICAL DATA:  [id number] Suspected sepsis [id number] EXAM: PORTABLE CHEST 1 VIEW COMPARISON:  Radiograph 11/28/22, CT 05/04/22 FINDINGS: Normal heart size and mediastinal contours. Basilar predominant interstitial lung disease. Slight increased density in the left greater right lower lobe may represent superimposed infection. No pleural effusion, pneumothorax or pulmonary edema. IMPRESSION: Basilar predominant interstitial lung disease. Slight increased density in the left greater right lower lobe may represent superimposed infection. Electronically Signed   By: Chadwick Colonel M.D.   On: 07/23/2023 21:58   Data Reviewed for HPI: Relevant notes from primary care and specialist visits,  past discharge summaries as available in EHR, including Care Everywhere. Prior diagnostic testing as pertinent to current admission diagnoses Updated medications and problem lists for reconciliation ED course, including vitals, labs, imaging, treatment and response to treatment Triage notes, nursing and pharmacy notes and ED provider's notes Notable results as noted above in HPI      Assessment and Plan: * CAP (community acquired pneumonia) Sepsis At risk of severe infection due to chronic immunosuppressants Pulmonary fibrosis Sepsis criteria include fever, tachycardia and tachypnea Will treat with cefepime and vancomycin  given risk of severe infection and failure of prior antibiotics Sepsis fluids Guaifenesin , DuoNebs every 6 and as needed Holding off on additional steroids in the setting of possible severe infection.  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  Will hold immunosuppressive therapy in the setting of severe infection: Rituxan , prednisone  and methotrexate Follows with both pulmonology and rheumatology   Aortic thrombus (HCC) History of aortic thrombus s/p thrombectomy and bilateral common iliac artery stent S/p right BKA secondary to critical limb ischemia Continue apixaban , atorvastatin , aspirin   Erosive gastritis Continue PPI  Chronic constipation Continue Linzess   Tobacco use Nicotine  patch   DVT prophylaxis: Eliquis   Consults: none  Advance Care Planning:   Code Status: Full Code   Family Communication: none  Disposition Plan: Back to previous home environment  Severity of Illness: The appropriate patient status for this patient is INPATIENT. Inpatient status is judged to be reasonable and necessary in order to provide the required  intensity of service to ensure the patient's safety. The patient's presenting symptoms, physical exam findings, and initial radiographic and laboratory data in the context of their chronic comorbidities is felt to place them at high risk for further clinical deterioration. Furthermore, it is not anticipated that the patient will be medically stable for discharge from the hospital within 2 midnights of admission.   * I certify that at the point of admission it is my clinical judgment that the patient will require  inpatient hospital care spanning beyond 2 midnights from the point of admission due to high intensity of service, high risk for further deterioration and high frequency of surveillance required.*  Author: Lanetta Pion, MD 07/24/2023 1:31 AM  For on call review [email or web address].

## 2023-07-24 NOTE — Assessment & Plan Note (Signed)
 07-24-2023 on Eliquis  due to prior hx of arterial thrombosis.

## 2023-07-24 NOTE — Assessment & Plan Note (Signed)
 Continue Linzess.

## 2023-07-24 NOTE — Assessment & Plan Note (Signed)
 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 Pt fractured her femur and is only able to wear her prosthesis 2-3 times a week.

## 2023-07-24 NOTE — Hospital Course (Signed)
 HPI: Alexandra [name] is a 53 y.o. 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  She saw her PCP on 5/5 with an 8-day complaint of cough, congestion and wheezing without fever.  She was treated with a Z-Pak, albuterol  and prednisone  however symptoms of shortness of breath continued to worsen, and she developed fever and chills. ED course and data review: febrile at 101.1 with pulse 111, RR 28 and BP 117/58. O2 sats 99% Labs notable for WBC 15,000 with lactic acid 2.1 Respiratory viral panel negative, UA negative CMP normal EKG, personally viewed and interpreted with sinus tachycardia and nonspecific ST-T wave changes   Chest x-ray showing possible infection "Basilar predominant interstitial lung disease. Slight increased density in the left greater right lower lobe may represent superimposed infection"   Patient started on Rocephin  and azithromycin  also given Solu-Medrol  and DuoNebs and sepsis fluids Hospitalist consulted for admission.   Significant Events: Admitted 07/23/2023 for CAP   Significant Labs: WBC 14.8, HgB 13.1, plt 513 Na 138, K 3.8, CO2 of 24, BUN 14, scr 0.64, glu 88 Lactic acid 2.1 Covid/flu/rsv negative UA negative  Significant Imaging Studies: CXR Basilar predominant interstitial lung disease. Slight increased density in the left greater right lower lobe may represent superimposed infection  Antibiotic Therapy: Anti-infectives (From admission, onward)    Start     Dose/Rate Route Frequency Ordered Stop   07/25/23 0900  sulfamethoxazole -trimethoprim  (BACTRIM  DS) 800-160 MG per tablet 1 tablet   Status:  Discontinued        1 tablet Oral Once per day on Monday Wednesday Friday 07/24/23 0058 07/24/23 0234   07/25/23 0600  Vancomycin  (VANCOCIN ) 1,250 mg in sodium chloride  0.9 % 250 mL IVPB  Status:  Discontinued        1,250 mg 166.7 mL/hr over 90 Minutes Intravenous  Once 07/24/23 0135 07/24/23 0136   07/25/23 0600  Vancomycin  (VANCOCIN ) 1,250 mg in sodium chloride  0.9 % 250 mL IVPB        1,250 mg 166.7 mL/hr over 90 Minutes Intravenous Every 24 hours 07/24/23 0136     07/24/23 0600  ceFEPIme (MAXIPIME) 2 g in sodium chloride  0.9 % 100 mL IVPB        2 g 200 mL/hr over 30 Minutes Intravenous Every 8 hours 07/24/23 0134     07/24/23 0230  vancomycin  (VANCOREADY) IVPB 1500 mg/300 mL        1,500 mg 150 mL/hr over 120 Minutes Intravenous  Once 07/24/23 0134 07/24/23 0504   07/23/23 2145  cefTRIAXone  (ROCEPHIN ) 2 g in sodium chloride  0.9 % 100 mL IVPB        2 g 200 mL/hr over 30 Minutes Intravenous  Once 07/23/23 2130 07/23/23 2224   07/23/23 2145  azithromycin  (ZITHROMAX ) 500 mg in sodium chloride  0.9 % 250 mL IVPB        500 mg 250 mL/hr over 60 Minutes Intravenous  Once 07/23/23 2130 07/23/23 2359       Procedures:   Consultants:    [facility] in [address]

## 2023-07-24 NOTE — Subjective & Objective (Signed)
 Patient seen and examined.  She states that she got a head cold that settled in her chest last week.  She is immunosuppressed.  She  is on chronic prednisone  2.5 mg daily.  She follows with rheumatology and pulmonology.  Is on room air.  Still wheezing.  Patient states that she does not take Esbriet that was prescribed by pulmonology last year.

## 2023-07-24 NOTE — Progress Notes (Signed)
  Results of high res CT chest reviewed. Will reach out to [facility] pulmonary tomorrow to see if there is anything else that needs to be done.  Pt remains on po prednisone  and IV Abx.  Unk Garb, DO [facility]

## 2023-07-25 DIAGNOSIS — J189 Pneumonia, unspecified organism: Secondary | ICD-10-CM | POA: Diagnosis not present

## 2023-07-25 LAB — BASIC METABOLIC PANEL WITH GFR
Anion gap: 9 (ref 5–15)
BUN: 14 mg/dL (ref 6–20)
CO2: 25 mmol/L (ref 22–32)
Calcium: 8.5 mg/dL — ABNORMAL LOW (ref 8.9–10.3)
Chloride: 108 mmol/L (ref 98–111)
Creatinine, Ser: 0.63 mg/dL (ref 0.44–1.00)
GFR, Estimated: >60 mL/min (ref >60)
Glucose, Bld: 114 mg/dL — ABNORMAL HIGH (ref 70–99)
Potassium: 3.4 mmol/L — ABNORMAL LOW (ref 3.5–5.1)
Sodium: 142 mmol/L (ref 135–145)

## 2023-07-25 LAB — CBC
HCT: 31.6 % — ABNORMAL LOW (ref 36.0–46.0)
Hemoglobin: 10.3 g/dL — ABNORMAL LOW (ref 12.0–15.0)
MCH: 28.9 pg (ref 26.0–34.0)
MCHC: 32.6 g/dL (ref 30.0–36.0)
MCV: 88.8 fL (ref 80.0–100.0)
Platelets: 431 10*3/uL — ABNORMAL HIGH (ref 150–400)
RBC: 3.56 MIL/uL — ABNORMAL LOW (ref 3.87–5.11)
RDW: 14.7 % (ref 11.5–15.5)
WBC: 19.7 10*3/uL — ABNORMAL HIGH (ref 4.0–10.5)
nRBC: 0 % (ref 0.0–0.2)

## 2023-07-25 LAB — C-REACTIVE PROTEIN: CRP: 2 mg/dL — ABNORMAL HIGH (ref <1.0)

## 2023-07-25 MED ORDER — SODIUM CHLORIDE 0.9 % IN NEBU: 3.0000 mL | INHALATION_SOLUTION | Freq: Three times a day (TID) | RESPIRATORY_TRACT | Status: DC

## 2023-07-25 MED ORDER — MELATONIN 5 MG PO TABS
10.0000 mg | ORAL_TABLET | Freq: Every evening | ORAL | Status: DC | PRN
  Administered 2023-07-25 (×2): 10 mg via ORAL
  Filled 2023-07-25 (×2): qty 2

## 2023-07-25 MED ORDER — ATORVASTATIN CALCIUM 40 MG PO TABS
40.0000 mg | ORAL_TABLET | Freq: Every day | ORAL | 0 refills | Status: DC
Start: 2023-07-25 — End: 2023-10-17

## 2023-07-25 NOTE — Consult Note (Signed)
 PULMONOLOGY         Date: 07/25/2023,   MRN# [id number] Alexandra [name] 1970-05-09     AdmissionWeight: 65.8 kg                 CurrentWeight: 65.8 kg  Referring provider: Dr Meyer Ada      CHIEF COMPLAINT:   Abnormal CT chest with pulmonary fibrosis   HISTORY OF PRESENT ILLNESS    Alexandra [name] is a 53 y.o. 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  Failure of outpatient therapy with antimicrobial regimen and steroids.  She had HRCT which was reviewed by me and shows bilateral GGO infiltrates on background of UIP pattern of ILD without pleural effusion of lung mass.    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)      SURGICAL HISTORY   Past Surgical History:  Procedure Laterality Date   ABDOMINAL HYSTERECTOMY     AMPUTATION Right 09/12/2020   Procedure: AMPUTATION BELOW KNEE;  Surgeon: Celso College, MD;  Location: [facility] ORS;  Service: Vascular;  Laterality: Right;   BIOPSY  11/10/2022   Procedure: BIOPSY;  Surgeon: Luke Salaam, MD;  Location: [facility] ENDOSCOPY;  Service: Gastroenterology;;   BREAST CYST EXCISION Left 2006   COLONOSCOPY WITH PROPOFOL  N/A 11/10/2022   Procedure: COLONOSCOPY WITH PROPOFOL ;  Surgeon: Luke Salaam, MD;  Location: [facility] ENDOSCOPY;  Service: Gastroenterology;  Laterality: N/A;   ENDOVASCULAR STENT INSERTION  N/A 09/08/2020   Procedure: ENDOVASCULAR STENT GRAFT INSERTION;  Surgeon: Celso College, MD;  Location: [facility] ORS;  Service: Vascular;  Laterality: N/A;   ESOPHAGOGASTRODUODENOSCOPY (EGD) WITH PROPOFOL  N/A 11/10/2022   Procedure: ESOPHAGOGASTRODUODENOSCOPY (EGD) WITH PROPOFOL ;  Surgeon: Luke Salaam, MD;  Location: [facility] ENDOSCOPY;  Service: Gastroenterology;  Laterality: N/A;   LOWER EXTREMITY ANGIOGRAPHY Right 09/01/2020   Procedure: Lower Extremity Angiography;  Surgeon: Celso College, MD;  Location: [facility] INVASIVE CV LAB;  Service: Cardiovascular;  Laterality: Right;   LOWER EXTREMITY ANGIOGRAPHY Left 10/01/2020   Procedure: LOWER EXTREMITY ANGIOGRAPHY;  Surgeon: Celso College, MD;  Location: [facility] INVASIVE CV LAB;  Service: Cardiovascular;  Laterality: Left;   LOWER EXTREMITY INTERVENTION Right 09/07/2020   Procedure: LOWER EXTREMITY INTERVENTION;  Surgeon: Margherita Shell, MD;  Location: [facility] INVASIVE CV LAB;  Service: Cardiovascular;  Laterality: Right;     FAMILY HISTORY   Family History  Problem Relation Age of Onset   Breast cancer Mother 6   Diabetes Father    Congestive Heart Failure Father    Diabetes Brother    Breast cancer Maternal Aunt    Breast cancer Maternal Aunt        Maternal Great Aunt    Diabetes Paternal Aunt    Breast cancer Maternal Grandmother    Diabetes Maternal Grandfather    Breast cancer Cousin 59     SOCIAL HISTORY   Social  History   Tobacco Use   Smoking status: Some Days    Current packs/day: 1.00    Average packs/day: 1 pack/day for 29.0 years (29.0 ttl pk-yrs)    Types: Cigarettes   Smokeless tobacco: Never   Tobacco comments:    Smokes 0.5 PPD 09/07/2022  Vaping Use   Vaping status: Never Used  Substance Use Topics   Alcohol use: Not Currently    Comment: social   Drug use: No     MEDICATIONS    Home Medication:  Current Outpatient Rx   Order #: [id number] Class: Normal    Current Medication:  Current Facility-Administered  Medications:    acetaminophen  (TYLENOL ) tablet 650 mg, 650 mg, Oral, Q6H PRN, 650 mg at 07/24/23 1120 **OR** acetaminophen  (TYLENOL ) suppository 650 mg, 650 mg, Rectal, Q6H PRN, Lanetta Pion, MD   albuterol  (PROVENTIL ) (2.5 MG/3ML) 0.083% nebulizer solution 2.5 mg, 2.5 mg, Nebulization, Q2H PRN, Lanetta Pion, MD   apixaban  (ELIQUIS ) tablet 5 mg, 5 mg, Oral, BID, Brion Cancel V, MD, 5 mg at 07/25/23 0856   atorvastatin  (LIPITOR) tablet 40 mg, 40 mg, Oral, Daily, Brion Cancel V, MD, 40 mg at 07/25/23 0856   budesonide (PULMICORT) nebulizer solution 0.25 mg, 0.25 mg, Nebulization, BID, Hallaji, Sheema M, RPH, 0.25 mg at 07/25/23 0758   ceFEPIme (MAXIPIME) 2 g in sodium chloride  0.9 % 100 mL IVPB, 2 g, Intravenous, Q8H, Belue, Rondell Code, RPH, Last Rate: 200 mL/hr at 07/25/23 1402, 2 g at 07/25/23 1402   citalopram  (CELEXA ) tablet 40 mg, 40 mg, Oral, Daily, Brion Cancel V, MD, 40 mg at 07/25/23 0855   ferrous gluconate  (FERGON) tablet 324 mg, 324 mg, Oral, Q breakfast, Brion Cancel V, MD, 324 mg at 07/24/23 4696   gabapentin  (NEURONTIN ) capsule 100 mg, 100 mg, Oral, QHS, Brion Cancel V, MD, 100 mg at 07/24/23 2154   guaiFENesin  (MUCINEX ) 12 hr tablet 1,200 mg, 1,200 mg, Oral, BID, Brion Cancel V, MD, 1,200 mg at 07/25/23 0856   HYDROcodone -acetaminophen  (NORCO/VICODIN) 5-325 MG per tablet 1-2 tablet, 1-2 tablet, Oral, Q4H PRN, Lanetta Pion, MD, 2 tablet at 07/25/23 0559   ipratropium-albuterol  (DUONEB) 0.5-2.5 (3) MG/3ML nebulizer solution 3 mL, 3 mL, Nebulization, Q6H, Brion Cancel V, MD, 3 mL at 07/25/23 1428   linaclotide  (LINZESS ) capsule 290 mcg, 290 mcg, Oral, QAC breakfast, Lanetta Pion, MD   melatonin tablet 10 mg, 10 mg, Oral, QHS PRN, Lanetta Pion, MD, 10 mg at 07/25/23 0059   nicotine  (NICODERM CQ  - dosed in mg/24 hours) patch 21 mg, 21 mg, Transdermal, Daily, Brion Cancel V, MD, 21 mg at 07/25/23 0857   ondansetron  (ZOFRAN ) tablet 4 mg, 4 mg, Oral, Q6H PRN **OR**  ondansetron  (ZOFRAN ) injection 4 mg, 4 mg, Intravenous, Q6H PRN, Lanetta Pion, MD   pantoprazole  (PROTONIX ) EC tablet 40 mg, 40 mg, Oral, Daily, Brion Cancel V, MD, 40 mg at 07/25/23 0855   predniSONE  (DELTASONE ) tablet 40 mg, 40 mg, Oral, Q breakfast, Unk Garb, DO, 40 mg at 07/25/23 0856   Vancomycin  (VANCOCIN ) 1,250 mg in sodium chloride  0.9 % 250 mL IVPB, 1,250 mg, Intravenous, Q24H, Belue, Rondell Code, RPH, Last Rate: 166.7 mL/hr at 07/25/23 0654, 1,250 mg at 07/25/23 0654    ALLERGIES   Patient has no known allergies.     REVIEW OF SYSTEMS    Review of Systems:  Gen:  Denies  fever, sweats, chills weigh loss  HEENT: Denies blurred vision, double vision, ear pain, eye pain,  hearing loss, nose bleeds, sore throat Cardiac:  No dizziness, chest pain or heaviness, chest tightness,edema Resp:   reports dyspnea chronically  Gi: Denies swallowing difficulty, stomach pain, nausea or vomiting, diarrhea, constipation, bowel incontinence Gu:  Denies bladder incontinence, burning urine Ext:   Denies Joint pain, stiffness or swelling Skin: Denies  skin rash, easy bruising or bleeding or hives Endoc:  Denies polyuria, polydipsia , polyphagia or weight change Psych:   Denies depression, insomnia or hallucinations   Other:  All other systems negative   VS: BP (!) 111/59 (BP Location: Left Arm)   Pulse 97   Temp 98.2 F (36.8 C)   Resp 18   Ht 5\' 2"  (1.575 m)   Wt 65.8 kg   LMP 01/14/2009   SpO2 96%   BMI 26.52 kg/m      PHYSICAL EXAM    GENERAL:NAD, no fevers, chills, no weakness no fatigue HEAD: Normocephalic, atraumatic.  EYES: Pupils equal, round, reactive to light. Extraocular muscles intact. No scleral icterus.  MOUTH: Moist mucosal membrane. Dentition intact. No abscess noted.  EAR, NOSE, THROAT: Clear without exudates. No external lesions.  NECK: Supple. No thyromegaly. No nodules. No JVD.  PULMONARY: decreased breath sounds with mild rhonchi worse at bases  bilaterally.  CARDIOVASCULAR: S1 and S2. Regular rate and rhythm. No murmurs, rubs, or gallops. No edema. Pedal pulses 2+ bilaterally.  GASTROINTESTINAL: Soft, nontender, nondistended. No masses. Positive bowel sounds. No hepatosplenomegaly.  MUSCULOSKELETAL: No swelling, clubbing, or edema. Range of motion full in all extremities.  NEUROLOGIC: Cranial nerves II through XII are intact. No gross focal neurological deficits. Sensation intact. Reflexes intact.  SKIN: No ulceration, lesions, rashes, or cyanosis. Skin warm and dry. Turgor intact.  PSYCHIATRIC: Mood, affect within normal limits. The patient is awake, alert and oriented x 3. Insight, judgment intact.       IMAGING     ASSESSMENT/PLAN   Acute exacerbation of interstitial lung disease    -patient s/p steroids IV transitioned to PO with prednisone     - will obtain CRP level for inflammatory intensity    -COVID/FLU/RSV ruled out   - Resp viral panel    - continue current antibiotics   -MRSA pcr    - procalcitonin    COPD without acute exacerbation    - continue DuoNEB therapy     - abx and steroids   Tobacco smoking     - nicotine  replacement      - smoking cessation counseling        Thank you for allowing me to participate in the care of this patient.   Patient/Family are satisfied with care plan and all questions have been answered.    Provider disclosure: Patient with at least one acute or chronic illness or injury that poses a threat to life or bodily function and is being managed actively during this encounter.  All of the below services have been performed independently by signing provider:  review of prior documentation from internal and or external health records.  Review of previous and current lab results.  Interview and comprehensive assessment during patient visit today. Review of current and previous chest radiographs/CT scans. Discussion of management and test interpretation with health care team  and patient/family.   This document was prepared using Dragon voice recognition software and may include unintentional dictation errors.     Yomar Mejorado, M.D.  Division of Pulmonary & Critical Care Medicine

## 2023-07-25 NOTE — Telephone Encounter (Signed)
 Requested medications are due for refill today.  yes  Requested medications are on the active medications list.  yes  Last refill. 04/09/2023 #90  0 rf  Future visit scheduled.   no  Notes to clinic.  Rx signed by Sharla Davis. Labs are expired.    Requested Prescriptions  Pending Prescriptions Disp Refills   atorvastatin  (LIPITOR) 40 MG tablet 90 tablet 0    Sig: Take 1 tablet (40 mg total) by mouth daily.     Cardiovascular:  Antilipid - Statins Failed - 07/25/2023 12:36 PM      Failed - Lipid Panel in normal range within the last 12 months    Cholesterol  Date Value Ref Range Status  09/07/2020 183 0 - 200 mg/dL Final   LDL Cholesterol  Date Value Ref Range Status  09/07/2020 118 (H) 0 - 99 mg/dL Final    Comment:           Total Cholesterol/HDL:CHD Risk Coronary Heart Disease Risk Table                     Men   Women  1/2 Average Risk   3.4   3.3  Average Risk       5.0   4.4  2 X Average Risk   9.6   7.1  3 X Average Risk  23.4   11.0        Use the calculated Patient Ratio above and the CHD Risk Table to determine the patient's CHD Risk.        ATP III CLASSIFICATION (LDL):  <100     mg/dL   Optimal  409-811  mg/dL   Near or Above                    Optimal  130-159  mg/dL   Borderline  914-782  mg/dL   High  >956     mg/dL   Very High Performed at [facility], [address] [place], [place] [address]    HDL  Date Value Ref Range Status  09/07/2020 36 (L) >40 mg/dL Final   Triglycerides  Date Value Ref Range Status  09/07/2020 143 <150 mg/dL Final         Passed - Patient is not pregnant      Passed - Valid encounter within last 12 months    Recent Outpatient Visits           2 months ago Swelling of joint of right knee   [facility] [place], [place], [place]

## 2023-07-25 NOTE — TOC CM/SW Note (Signed)
 Transition of Care [facility]) - Inpatient Brief Assessment   Patient Details  Name: JAKHYA TEKLE MRN: [id number] Date of Birth: Jul 22, 1970  Transition of Care [facility]) CM/SW Contact:    Loman Risk, RN Phone Number: 07/25/2023, 12:13 PM   Clinical Narrative:   Transition of Care [facility]) Screening Note   Patient Details  Name: DESHIRA VOGELMAN Date of Birth: 1970/08/28   Transition of Care [facility]) CM/SW Contact:    Loman Risk, RN Phone Number: 07/25/2023, 12:13 PM    Transition of Care Department [facility]) has reviewed patient and no TOC needs have been identified at this time. . If new patient transition needs arise, please place a TOC consult.    Transition of Care Asessment: Insurance and Status: Insurance coverage has been reviewed Patient has primary care physician: Yes     Prior/Current Home Services: No current home services Social Drivers of Health Review: SDOH reviewed no interventions necessary Readmission risk has been reviewed: Yes Transition of care needs: no transition of care needs at this time

## 2023-07-25 NOTE — Care Management Important Message (Signed)
 Important Message  Patient Details  Name: Alexandra [name] MRN: [id number] Date of Birth: 1970-10-03   Important Message Given:  Yes - Medicare IM     Anise Kerns 07/25/2023, 12:33 PM

## 2023-07-25 NOTE — Progress Notes (Signed)
 Progress Note   Patient: Alexandra [name] QVZ:[id number] DOB: September 17, 1970 DOA: 07/23/2023     2 DOS: the patient was seen and examined on 07/25/2023   Brief hospital course: Alexandra [name] is a 53 y.o. 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  She saw her PCP on 5/5 with an 8-day complaint of cough, congestion and wheezing without fever.  She was treated with a Z-Pak, albuterol  and prednisone  however symptoms of shortness of breath continued to worsen, and she developed fever and chills. ED course and data review: febrile at 101.1 with pulse 111, RR 28 and BP 117/58. O2 sats 99% Labs notable for WBC 15,000 with lactic acid 2.1 Respiratory viral panel negative, UA negative CMP normal EKG, personally viewed and interpreted with sinus tachycardia and nonspecific ST-T wave changes   Chest x-ray showing possible infection "Basilar predominant interstitial lung disease. Slight increased density in the left greater right lower lobe may represent superimposed infection"   Patient started on Rocephin  and azithromycin  also given Solu-Medrol  and DuoNebs and sepsis fluids Hospitalist consulted for admission.   Assessment and Plan:  Sepsis * CAP (community acquired pneumonia)  At risk of severe infection due to chronic immunosuppressants. 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 Superimposed elements of  airways disease and emphysema noted. Ground-glass opacities within both lungs with bronchial wall thickening and increased peribronchovascular soft tissue may reflect active inflammation or overlapping airways disease such as bronchiolitis or organizing pneumonia (both may be seen in rheumatoid arthritis). Continue empiric antibiotic therapy with cefepime and vancomycin  Continue guaifenesin , DuoNebs every 6 and will add normal saline nebulizers to improve expectoration Received methylprednisolone  in the ED, continue prednisone  40 mg daily Continue flutter valve and incentive spirometer Will consult pulmonary     History of known pulmonary fibrosis due to interstitial lung disease related to granulomatosis with polyangiitis History of ANCA vasculitis History of granulomatosis with polyangiitis Continue prednisone  Methotrexate and pirfenidone are currently on hold due to severe infection    Erosive gastritis Continue PPI        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  Pt fractured her femur and is only able to wear her prosthesis 2-3 times a week.     History of arterial thrombosis - aortic thrombus History of aortic thrombus s/p thrombectomy and bilateral common iliac artery stent S/p right BKA secondary to critical limb ischemia Continue apixaban , atorvastatin , aspirin       Recurrent depression (HCC) continue celexa .    Tobacco use Continue Nicotine  patch    Chronic constipation Continue Linzess    Hypokalemia Supplement potassium     Subjective: Patient is seen and examined at the bedside.  Requests for an expectorant  Physical Exam: Vitals:   07/25/23 0111 07/25/23 0308 07/25/23 0759 07/25/23 0843  BP:  105/60  (!) 111/59  Pulse:  78  97  Resp:  20  18  Temp:  98 F (36.7 C)  98.2 F (36.8 C)  TempSrc:  Oral    SpO2: 96% 99% 99% 95%  Weight:      Height:       Vitals and nursing note  reviewed.  Constitutional:      General: She is not in acute distress.    Appearance: She is normal weight. She is not toxic-appearing or diaphoretic.  HENT:     Head: Normocephalic and atraumatic.     Nose: Nose normal.  Eyes:     General: No scleral icterus. Cardiovascular:     Rate and Rhythm: Normal rate and regular rhythm.  Pulmonary:     Effort: Pulmonary effort is normal. No respiratory distress.     Comments: Scattered rhonchi and wheezing in both lung fields Abdominal:     General: Abdomen is flat. Bowel sounds are normal.     Palpations: Abdomen is soft.  Musculoskeletal:     Comments: Right BKA  Skin:    General: Skin is warm and dry.     Capillary Refill: Capillary refill takes less than 2 seconds.  Neurological:     General: No focal deficit present.     Mental Status: She is alert and oriented to person, place, and time.        Data Reviewed: White count 19.7, hemoglobin 10.3, potassium 3.4 Labs reviewed  Family Communication: Plan of care was discussed with patient at the bedside.  She verbalizes understanding and agrees with the plan.  Disposition: Status is: Inpatient Remains inpatient appropriate because: Requires IV antibiotics  Planned Discharge Destination: Home    Time spent: 38 minutes  Author: Read Camel, MD 07/25/2023 12:35 PM  For on call review [email or web address].

## 2023-07-26 ENCOUNTER — Ambulatory Visit: Admitting: Physician Assistant

## 2023-07-26 DIAGNOSIS — J849 Interstitial pulmonary disease, unspecified: Secondary | ICD-10-CM

## 2023-07-26 LAB — BASIC METABOLIC PANEL WITH GFR
Anion gap: 9 (ref 5–15)
BUN: 18 mg/dL (ref 6–20)
CO2: 25 mmol/L (ref 22–32)
Calcium: 8.5 mg/dL — ABNORMAL LOW (ref 8.9–10.3)
Chloride: 107 mmol/L (ref 98–111)
Creatinine, Ser: 0.52 mg/dL (ref 0.44–1.00)
GFR, Estimated: >60 mL/min (ref >60)
Glucose, Bld: 149 mg/dL — ABNORMAL HIGH (ref 70–99)
Potassium: 3.4 mmol/L — ABNORMAL LOW (ref 3.5–5.1)
Sodium: 141 mmol/L (ref 135–145)

## 2023-07-26 LAB — CBC WITH DIFFERENTIAL/PLATELET
Abs Immature Granulocytes: 0.14 10*3/uL — ABNORMAL HIGH (ref 0.00–0.07)
Basophils Absolute: 0 10*3/uL (ref 0.0–0.1)
Basophils Relative: 0 %
Eosinophils Absolute: 0.4 10*3/uL (ref 0.0–0.5)
Eosinophils Relative: 3 %
HCT: 35.8 % — ABNORMAL LOW (ref 36.0–46.0)
Hemoglobin: 11.4 g/dL — ABNORMAL LOW (ref 12.0–15.0)
Immature Granulocytes: 1 %
Lymphocytes Relative: 20 %
Lymphs Abs: 3.5 10*3/uL (ref 0.7–4.0)
MCH: 28.7 pg (ref 26.0–34.0)
MCHC: 31.8 g/dL (ref 30.0–36.0)
MCV: 90.2 fL (ref 80.0–100.0)
Monocytes Absolute: 0.9 10*3/uL (ref 0.1–1.0)
Monocytes Relative: 5 %
Neutro Abs: 12.3 10*3/uL — ABNORMAL HIGH (ref 1.7–7.7)
Neutrophils Relative %: 71 %
Platelets: 470 10*3/uL — ABNORMAL HIGH (ref 150–400)
RBC: 3.97 MIL/uL (ref 3.87–5.11)
RDW: 15.3 % (ref 11.5–15.5)
WBC: 17.3 10*3/uL — ABNORMAL HIGH (ref 4.0–10.5)
nRBC: 0 % (ref 0.0–0.2)

## 2023-07-26 LAB — PROCALCITONIN: Procalcitonin: <0.1 ng/mL

## 2023-07-26 MED ORDER — NICOTINE 21 MG/24HR TD PT24: 21.0000 mg | MEDICATED_PATCH | Freq: Every day | TRANSDERMAL | 5 refills | Status: DC

## 2023-07-26 MED ORDER — AZITHROMYCIN 250 MG PO TABS: 250.0000 mg | ORAL_TABLET | Freq: Every day | ORAL | 0 refills | Status: AC

## 2023-07-26 MED ORDER — PREDNISONE 20 MG PO TABS: ORAL_TABLET | ORAL | 0 refills | Status: DC

## 2023-07-26 MED ORDER — NICOTINE 21 MG/24HR TD PT24: 21.0000 mg | MEDICATED_PATCH | Freq: Every day | TRANSDERMAL | 4 refills | Status: DC

## 2023-07-26 MED ORDER — HYDROCOD POLI-CHLORPHE POLI ER 10-8 MG/5ML PO SUER: 5.0000 mL | Freq: Every evening | ORAL | 0 refills | Status: AC | PRN

## 2023-07-26 MED ORDER — PREDNISONE 20 MG PO TABS: ORAL_TABLET | ORAL | 0 refills | Status: AC

## 2023-07-26 MED ORDER — VANCOMYCIN HCL 1250 MG/250ML IV SOLN
1250.0000 mg | INTRAVENOUS | Status: DC
  Administered 2023-07-26: 1250 mg via INTRAVENOUS
  Filled 2023-07-26: qty 250

## 2023-07-26 MED ORDER — AMOXICILLIN-POT CLAVULANATE 875-125 MG PO TABS: 1.0000 | ORAL_TABLET | Freq: Two times a day (BID) | ORAL | 0 refills | Status: DC

## 2023-07-26 MED ORDER — FERROUS GLUCONATE 324 (38 FE) MG PO TABS: 324.0000 mg | ORAL_TABLET | Freq: Every day | ORAL | Status: AC

## 2023-07-26 MED ORDER — AMOXICILLIN-POT CLAVULANATE 875-125 MG PO TABS
1.0000 | ORAL_TABLET | Freq: Two times a day (BID) | ORAL | 0 refills | Status: AC
Start: 2023-07-26 — End: 2023-08-02

## 2023-07-26 MED ORDER — BENZONATATE 100 MG PO CAPS: 200.0000 mg | ORAL_CAPSULE | Freq: Three times a day (TID) | ORAL | 0 refills | Status: DC | PRN

## 2023-07-26 MED ORDER — GUAIFENESIN ER 600 MG PO TB12
1200.0000 mg | ORAL_TABLET | Freq: Two times a day (BID) | ORAL | 0 refills | Status: AC
Start: 2023-07-26 — End: 2023-08-16

## 2023-07-26 MED ORDER — ACETAMINOPHEN-CODEINE 300-30 MG PO TABS: 1.0000 | ORAL_TABLET | Freq: Every evening | ORAL | 0 refills | Status: DC

## 2023-07-26 MED ORDER — AZITHROMYCIN 250 MG PO TABS: 250.0000 mg | ORAL_TABLET | Freq: Every day | ORAL | 0 refills | Status: DC

## 2023-07-26 NOTE — Progress Notes (Signed)
 PULMONOLOGY         Date: 07/26/2023,   MRN# [id number] Alexandra [name] 12-02-1970     AdmissionWeight: 65.8 kg                 CurrentWeight: 65.8 kg  Referring provider: Dr Meyer Ada      CHIEF COMPLAINT:   Abnormal CT chest with pulmonary fibrosis   HISTORY OF PRESENT ILLNESS    Alexandra [name] is a 53 y.o. 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  Failure of outpatient therapy with antimicrobial regimen and steroids.  She had HRCT which was reviewed by me and shows bilateral GGO infiltrates on background of UIP pattern of ILD without pleural effusion of lung mass.   07/26/23- patient seen at bedside. She is on room air. She appears to be close to baseline.  Will discuss dc planning and outpatient follow up today.   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)      SURGICAL HISTORY   Past Surgical History:  Procedure Laterality Date   ABDOMINAL HYSTERECTOMY     AMPUTATION Right 09/12/2020   Procedure: AMPUTATION BELOW KNEE;  Surgeon: Celso College, MD;  Location: [facility] ORS;  Service: Vascular;  Laterality: Right;   BIOPSY  11/10/2022   Procedure: BIOPSY;  Surgeon: Luke Salaam, MD;  Location: [facility] ENDOSCOPY;  Service: Gastroenterology;;   BREAST CYST EXCISION Left 2006   COLONOSCOPY WITH PROPOFOL  N/A 11/10/2022   Procedure:  COLONOSCOPY WITH PROPOFOL ;  Surgeon: Luke Salaam, MD;  Location: [facility] ENDOSCOPY;  Service: Gastroenterology;  Laterality: N/A;   ENDOVASCULAR STENT INSERTION N/A 09/08/2020   Procedure: ENDOVASCULAR STENT GRAFT INSERTION;  Surgeon: Celso College, MD;  Location: [facility] ORS;  Service: Vascular;  Laterality: N/A;   ESOPHAGOGASTRODUODENOSCOPY (EGD) WITH PROPOFOL  N/A 11/10/2022   Procedure: ESOPHAGOGASTRODUODENOSCOPY (EGD) WITH PROPOFOL ;  Surgeon: Luke Salaam, MD;  Location: [facility] ENDOSCOPY;  Service: Gastroenterology;  Laterality: N/A;   LOWER EXTREMITY ANGIOGRAPHY Right 09/01/2020   Procedure: Lower Extremity Angiography;  Surgeon: Celso College, MD;  Location: [facility] INVASIVE CV LAB;  Service: Cardiovascular;  Laterality: Right;   LOWER EXTREMITY ANGIOGRAPHY Left 10/01/2020   Procedure: LOWER EXTREMITY ANGIOGRAPHY;  Surgeon: Celso College, MD;  Location: [facility] INVASIVE CV LAB;  Service: Cardiovascular;  Laterality: Left;   LOWER EXTREMITY INTERVENTION Right 09/07/2020   Procedure: LOWER EXTREMITY INTERVENTION;  Surgeon: Margherita Shell, MD;  Location: [facility] INVASIVE CV LAB;  Service: Cardiovascular;  Laterality: Right;     FAMILY HISTORY   Family History  Problem Relation Age of Onset   Breast cancer Mother 31   Diabetes Father    Congestive Heart Failure Father    Diabetes Brother    Breast cancer Maternal Aunt    Breast cancer Maternal Aunt        Maternal Great Aunt    Diabetes Paternal Aunt  Breast cancer Maternal Grandmother    Diabetes Maternal Grandfather    Breast cancer Cousin 44     SOCIAL HISTORY   Social History   Tobacco Use   Smoking status: Some Days    Current packs/day: 1.00    Average packs/day: 1 pack/day for 29.0 years (29.0 ttl pk-yrs)    Types: Cigarettes   Smokeless tobacco: Never   Tobacco comments:    Smokes 0.5 PPD 09/07/2022  Vaping Use   Vaping status: Never Used  Substance Use Topics   Alcohol use: Not Currently    Comment: social   Drug use: No      MEDICATIONS    Home Medication:  Current Outpatient Rx   Order #: [id number] Class: Normal    Current Medication:  Current Facility-Administered Medications:    acetaminophen  (TYLENOL ) tablet 650 mg, 650 mg, Oral, Q6H PRN, 650 mg at 07/24/23 1120 **OR** acetaminophen  (TYLENOL ) suppository 650 mg, 650 mg, Rectal, Q6H PRN, Lanetta Pion, MD   albuterol  (PROVENTIL ) (2.5 MG/3ML) 0.083% nebulizer solution 2.5 mg, 2.5 mg, Nebulization, Q2H PRN, Lanetta Pion, MD   apixaban  (ELIQUIS ) tablet 5 mg, 5 mg, Oral, BID, Lanetta Pion, MD, 5 mg at 07/26/23 4098   atorvastatin  (LIPITOR) tablet 40 mg, 40 mg, Oral, Daily, Brion Cancel V, MD, 40 mg at 07/26/23 0819   budesonide (PULMICORT) nebulizer solution 0.25 mg, 0.25 mg, Nebulization, BID, Hallaji, Sheema M, RPH, 0.25 mg at 07/26/23 0754   ceFEPIme (MAXIPIME) 2 g in sodium chloride  0.9 % 100 mL IVPB, 2 g, Intravenous, Q8H, Belue, Rondell Code, RPH, Last Rate: 200 mL/hr at 07/26/23 0510, 2 g at 07/26/23 0510   citalopram  (CELEXA ) tablet 40 mg, 40 mg, Oral, Daily, Brion Cancel V, MD, 40 mg at 07/26/23 1191   ferrous gluconate  (FERGON) tablet 324 mg, 324 mg, Oral, Q breakfast, Brion Cancel V, MD, 324 mg at 07/26/23 4782   gabapentin  (NEURONTIN ) capsule 100 mg, 100 mg, Oral, QHS, Brion Cancel V, MD, 100 mg at 07/25/23 2123   guaiFENesin  (MUCINEX ) 12 hr tablet 1,200 mg, 1,200 mg, Oral, BID, Brion Cancel V, MD, 1,200 mg at 07/26/23 9562   HYDROcodone -acetaminophen  (NORCO/VICODIN) 5-325 MG per tablet 1-2 tablet, 1-2 tablet, Oral, Q4H PRN, Lanetta Pion, MD, 2 tablet at 07/25/23 2022   ipratropium-albuterol  (DUONEB) 0.5-2.5 (3) MG/3ML nebulizer solution 3 mL, 3 mL, Nebulization, Q6H, Brion Cancel V, MD, 3 mL at 07/26/23 0754   linaclotide  (LINZESS ) capsule 290 mcg, 290 mcg, Oral, QAC breakfast, Lanetta Pion, MD   melatonin tablet 10 mg, 10 mg, Oral, QHS PRN, Lanetta Pion, MD, 10 mg at 07/25/23 2123   nicotine  (NICODERM CQ  - dosed in mg/24  hours) patch 21 mg, 21 mg, Transdermal, Daily, Brion Cancel V, MD, 21 mg at 07/26/23 0818   ondansetron  (ZOFRAN ) tablet 4 mg, 4 mg, Oral, Q6H PRN **OR** ondansetron  (ZOFRAN ) injection 4 mg, 4 mg, Intravenous, Q6H PRN, Lanetta Pion, MD   pantoprazole  (PROTONIX ) EC tablet 40 mg, 40 mg, Oral, Daily, Brion Cancel V, MD, 40 mg at 07/26/23 1308   predniSONE  (DELTASONE ) tablet 40 mg, 40 mg, Oral, Q breakfast, Unk Garb, DO, 40 mg at 07/26/23 0819   vancomycin  (VANCOREADY) IVPB 1250 mg/250 mL, 1,250 mg, Intravenous, Q24H, Belue, Rondell Code, RPH, Last Rate: 166.7 mL/hr at 07/26/23 0708, 1,250 mg at 07/26/23 0708    ALLERGIES   Patient has no known allergies.     REVIEW OF SYSTEMS    Review of Systems:  Gen:  Denies  fever, sweats, chills weigh loss  HEENT: Denies blurred vision, double vision, ear pain, eye pain, hearing loss, nose bleeds, sore throat Cardiac:  No dizziness, chest pain or heaviness, chest tightness,edema Resp:   reports dyspnea chronically  Gi: Denies swallowing difficulty, stomach pain, nausea or vomiting, diarrhea, constipation, bowel incontinence Gu:  Denies bladder incontinence, burning urine Ext:   Denies Joint pain, stiffness or swelling Skin: Denies  skin rash, easy bruising or bleeding or hives Endoc:  Denies polyuria, polydipsia , polyphagia or weight change Psych:   Denies depression, insomnia or hallucinations   Other:  All other systems negative   VS: BP 117/61 (BP Location: Right Arm)   Pulse 87   Temp 98 F (36.7 C)   Resp 18   Ht 5\' 2"  (1.575 m)   Wt 65.8 kg   LMP 01/14/2009   SpO2 93%   BMI 26.52 kg/m      PHYSICAL EXAM    GENERAL:NAD, no fevers, chills, no weakness no fatigue HEAD: Normocephalic, atraumatic.  EYES: Pupils equal, round, reactive to light. Extraocular muscles intact. No scleral icterus.  MOUTH: Moist mucosal membrane. Dentition intact. No abscess noted.  EAR, NOSE, THROAT: Clear without exudates. No external lesions.   NECK: Supple. No thyromegaly. No nodules. No JVD.  PULMONARY: decreased breath sounds with mild rhonchi worse at bases bilaterally.  CARDIOVASCULAR: S1 and S2. Regular rate and rhythm. No murmurs, rubs, or gallops. No edema. Pedal pulses 2+ bilaterally.  GASTROINTESTINAL: Soft, nontender, nondistended. No masses. Positive bowel sounds. No hepatosplenomegaly.  MUSCULOSKELETAL: No swelling, clubbing, or edema. Range of motion full in all extremities.  NEUROLOGIC: Cranial nerves II through XII are intact. No gross focal neurological deficits. Sensation intact. Reflexes intact.  SKIN: No ulceration, lesions, rashes, or cyanosis. Skin warm and dry. Turgor intact.  PSYCHIATRIC: Mood, affect within normal limits. The patient is awake, alert and oriented x 3. Insight, judgment intact.       IMAGING     ASSESSMENT/PLAN   Acute exacerbation of interstitial lung disease    -patient s/p steroids IV transitioned to PO with prednisone     - will obtain CRP level for inflammatory intensity    -COVID/FLU/RSV ruled out   - Resp viral panel    - continue current antibiotics   -MRSA pcr    - procalcitonin    COPD without acute exacerbation    - continue DuoNEB therapy     - abx and steroids   Tobacco smoking     - nicotine  replacement      - smoking cessation counseling        Thank you for allowing me to participate in the care of this patient.   Patient/Family are satisfied with care plan and all questions have been answered.    Provider disclosure: Patient with at least one acute or chronic illness or injury that poses a threat to life or bodily function and is being managed actively during this encounter.  All of the below services have been performed independently by signing provider:  review of prior documentation from internal and or external health records.  Review of previous and current lab results.  Interview and comprehensive assessment during patient visit today. Review of  current and previous chest radiographs/CT scans. Discussion of management and test interpretation with health care team and patient/family.   This document was prepared using Dragon voice recognition software and may include unintentional dictation errors.     Wylie Coon, M.D.  Division of Pulmonary & Critical Care Medicine

## 2023-07-26 NOTE — Plan of Care (Signed)
  Problem: Respiratory: Goal: Ability to maintain adequate ventilation will improve Outcome: Progressing   Problem: Clinical Measurements: Goal: Diagnostic test results will improve Outcome: Progressing   Problem: Education: Goal: Knowledge of General Education information will improve Description: Including pain rating scale, medication(s)/side effects and non-pharmacologic comfort measures Outcome: Progressing

## 2023-07-26 NOTE — Plan of Care (Signed)
  Problem: Fluid Volume: Goal: Hemodynamic stability will improve 07/26/2023 1529 by Lamar Pillar, RN Outcome: Progressing 07/26/2023 1529 by Lamar Pillar, RN Outcome: Progressing   Problem: Clinical Measurements: Goal: Diagnostic test results will improve Outcome: Progressing Goal: Signs and symptoms of infection will decrease Outcome: Progressing   Problem: Respiratory: Goal: Ability to maintain adequate ventilation will improve Outcome: Progressing   Problem: Education: Goal: Knowledge of General Education information will improve Description: Including pain rating scale, medication(s)/side effects and non-pharmacologic comfort measures Outcome: Progressing   Problem: Health Behavior/Discharge Planning: Goal: Ability to manage health-related needs will improve Outcome: Progressing   Problem: Clinical Measurements: Goal: Ability to maintain clinical measurements within normal limits will improve Outcome: Progressing Goal: Will remain free from infection Outcome: Progressing Goal: Diagnostic test results will improve Outcome: Progressing Goal: Respiratory complications will improve Outcome: Progressing Goal: Cardiovascular complication will be avoided Outcome: Progressing   Problem: Activity: Goal: Risk for activity intolerance will decrease Outcome: Progressing   Problem: Nutrition: Goal: Adequate nutrition will be maintained Outcome: Progressing   Problem: Coping: Goal: Level of anxiety will decrease Outcome: Progressing   Problem: Elimination: Goal: Will not experience complications related to bowel motility Outcome: Progressing Goal: Will not experience complications related to urinary retention Outcome: Progressing   Problem: Pain Managment: Goal: General experience of comfort will improve and/or be controlled Outcome: Progressing   Problem: Safety: Goal: Ability to remain free from injury will improve Outcome: Progressing   Problem: Skin  Integrity: Goal: Risk for impaired skin integrity will decrease Outcome: Progressing

## 2023-07-26 NOTE — Discharge Instructions (Signed)
 Please continue your antibiotics and steroids as prescribed. Please schedule close follow up with the [facility] clinic pulmonology team.

## 2023-07-26 NOTE — Progress Notes (Signed)
 Discharge instructions were reviewed with patient. IV was removed. Belongings collected by patient. Questions encouraged and answered.

## 2023-07-28 ENCOUNTER — Other Ambulatory Visit

## 2023-07-28 LAB — CULTURE, BLOOD (ROUTINE X 2)
Culture: NO GROWTH
Special Requests: ADEQUATE

## 2023-07-28 NOTE — Discharge Summary (Incomplete)
 Physician Discharge Summary   Patient: Alexandra [name] MRN: [id number] DOB: 10-19-1970  Admit date:     07/23/2023  Discharge date: 07/26/2023  Discharge Physician: Joette Mustard   PCP: Mimi Alt, MD   Recommendations at discharge:  {Tip this will not be part of the note when signed- Example include specific recommendations for outpatient follow-up, pending tests to follow-up on. (Optional):26781}  Work up mild anemia outpatient   Discharge Diagnoses: Principal Problem:   CAP (community acquired pneumonia) Active Problems:   Sepsis (HCC)   Pulmonary fibrosis (HCC)   Tobacco use   Recurrent depression (HCC)   ANCA-associated vasculitis (HCC)   History of arterial thrombosis - aortic thrombus   Status post below-knee amputation of right lower extremity (HCC)   Chronic anticoagulation   At risk for infection due to immunosuppression   Erosive gastritis  Resolved Problems:   * No resolved hospital problems. *  Hospital Course: HPI: Alexandra [name] is a 53 y.o. 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  She saw her PCP on 5/5 with an 8-day complaint of cough, congestion and wheezing without fever.  She was treated with a Z-Pak, albuterol  and prednisone  however symptoms of shortness of breath continued to worsen, and she developed fever and chills. ED course and data review: febrile at 101.1 with pulse 111, RR 28 and BP 117/58. O2 sats 99% Labs notable for WBC 15,000 with lactic acid 2.1 Respiratory viral panel negative, UA negative CMP normal EKG, personally viewed and interpreted with sinus  tachycardia and nonspecific ST-T wave changes   Chest x-ray showing possible infection "Basilar predominant interstitial lung disease. Slight increased density in the left greater right lower lobe may represent superimposed infection"   Patient started on Rocephin  and azithromycin  also given Solu-Medrol  and DuoNebs and sepsis fluids Hospitalist consulted for admission.   Significant Labs: WBC 14.8, HgB 13.1, plt 513 Na 138, K 3.8, CO2 of 24, BUN 14, scr 0.64, glu 88 Lactic acid 2.1 Covid/flu/rsv negative UA negative  Significant Imaging Studies: CXR Basilar predominant interstitial lung disease. Slight increased density in the left greater right lower lobe may represent superimposed infection  Antibiotic Therapy: Anti-infectives (From admission, onward)    Start     Dose/Rate Route Frequency Ordered Stop   07/25/23 0900  sulfamethoxazole -trimethoprim  (BACTRIM  DS) 800-160 MG per tablet 1 tablet  Status:  Discontinued        1 tablet Oral Once per day on Monday Wednesday Friday 07/24/23 0058 07/24/23 0234   07/25/23 0600  Vancomycin  (VANCOCIN ) 1,250 mg in sodium chloride  0.9 % 250 mL IVPB  Status:  Discontinued        1,250 mg 166.7 mL/hr over 90 Minutes Intravenous  Once 07/24/23 0135 07/24/23 0136   07/25/23 0600  Vancomycin  (VANCOCIN ) 1,250 mg in sodium chloride  0.9 % 250 mL IVPB        1,250 mg 166.7 mL/hr over 90 Minutes Intravenous Every 24 hours 07/24/23 0136     07/24/23 0600  ceFEPIme (MAXIPIME) 2 g in sodium chloride  0.9 % 100 mL IVPB        2 g 200 mL/hr over 30 Minutes Intravenous Every 8 hours 07/24/23 0134     07/24/23  0230  vancomycin  (VANCOREADY) IVPB 1500 mg/300 mL        1,500 mg 150 mL/hr over 120 Minutes Intravenous  Once 07/24/23 0134 07/24/23 0504   07/23/23 2145  cefTRIAXone  (ROCEPHIN ) 2 g in sodium chloride  0.9 % 100 mL IVPB        2 g 200 mL/hr over 30 Minutes Intravenous  Once 07/23/23 2130 07/23/23 2224   07/23/23 2145  azithromycin  (ZITHROMAX ) 500 mg  in sodium chloride  0.9 % 250 mL IVPB        500 mg 250 mL/hr over 60 Minutes Intravenous  Once 07/23/23 2130 07/23/23 2359       Procedures:   Consultants:    [facility] in [address]   Assessment and Plan: * CAP (community acquired pneumonia) On admission. Sepsis. At risk of severe infection due to chronic immunosuppressants. Pulmonary fibrosis Sepsis criteria include fever, tachycardia and tachypnea Will treat with cefepime and vancomycin  given risk of severe infection and failure of prior antibiotics Sepsis fluids Guaifenesin , DuoNebs every 6 and as needed Holding off on additional steroids in the setting of possible severe infection.  Received methylprednisolone  in the ED and is on chronic low-dose prednisone  flutter valve and incentive spirometer Supplemental oxygen if needed  07-24-2023 continue with cefepime/vanco. 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 Overall, pt feeling ok.  Will check CT high res as this was recommended to be obtained in 2024 but never done. Continue with prednisone  40 mg daily. And nebs.  Sepsis (HCC) 07-24-2023 present on admission. Sepsis criteria include fever, tachycardia and tachypnea   Pulmonary fibrosis (HCC) 07-24-2023 Pt with known basilar pulmonary fibrosis making CXR findings difficult to interpret. Overall, pt feeling ok. Will check CT high res as this was recommended to be obtained in 2024 but never done. Continue with prednisone  40 mg daily. And nebs.   Erosive gastritis 07-23-2020 Continue PPI  At risk for infection due to immunosuppression 07-24-2023 on chronic prednisone  2.5 mg daily.  Chronic anticoagulation 07-24-2023 on Eliquis  due to prior hx of arterial thrombosis.  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 Pt fractured her femur and is only able to wear her prosthesis 2-3 times a week.  History of  arterial thrombosis - aortic thrombus On admission. History of aortic thrombus s/p thrombectomy and bilateral common iliac artery stent S/p right BKA secondary to critical limb ischemia Continue apixaban , atorvastatin , aspirin   07-24-2023 continue Eliquis  and ASA.  ANCA-associated vasculitis (HCC) On admission. 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 Will continue with 40 mg prednisone  daily.  Recurrent depression (HCC) 07-24-2023 continue celexa .  Tobacco use 07-24-2023 continue Nicotine  patch  Chronic constipation Continue Linzess       {Tip this will not be part of the note when signed Body mass index is 26.52 kg/m. , ,  (Optional):26781}  {(NOTE) Pain control PDMP Statment (Optional):26782} Consultants: *** Procedures performed: ***  Disposition: {Plan; Disposition:26390} Diet recommendation:  {Diet_Plan:26776} DISCHARGE MEDICATION: Allergies as of 07/26/2023   No Known Allergies      Medication List     PAUSE taking these medications    midodrine  5 MG tablet Wait to take this until your doctor or other care provider tells you to start again. Commonly known as: PROAMATINE  Take 1 tablet (5  mg total) by mouth 3 (three) times daily with meals.   predniSONE  2.5 MG tablet Wait to take this until your doctor or other care provider tells you to start again. Commonly known as: DELTASONE  Take 2.5 mg by mouth daily with breakfast. You also have another medication with the same name that you may need to continue taking.       STOP taking these medications    acetaminophen -codeine 300-30 MG tablet Commonly known as: TYLENOL  #3   calcium  carbonate 1500 (600 Ca) MG Tabs tablet Commonly known as: OSCAL   Magnesium  Citrate 100 MG Caps    promethazine -dextromethorphan 6.25-15 MG/5ML syrup Commonly known as: PROMETHAZINE -DM   sulfamethoxazole -trimethoprim  800-160 MG tablet Commonly known as: BACTRIM  DS       TAKE these medications    albuterol  108 (90 Base) MCG/ACT inhaler Commonly known as: VENTOLIN  HFA Inhale 1-2 puffs into the lungs every 6 (six) hours as needed for wheezing or shortness of breath (cough).   amoxicillin -clavulanate 875-125 MG tablet Commonly known as: AUGMENTIN  Take 1 tablet by mouth 2 (two) times daily for 7 days.   apixaban  5 MG Tabs tablet Commonly known as: ELIQUIS  Take 1 tablet (5 mg total) by mouth 2 (two) times daily.   aspirin  EC 81 MG tablet Take 1 tablet (81 mg total) by mouth daily. Swallow whole.   atorvastatin  40 MG tablet Commonly known as: LIPITOR Take 1 tablet (40 mg total) by mouth daily.   azithromycin  250 MG tablet Commonly known as: Zithromax  Z-Pak Take 1 tablet (250 mg total) by mouth daily for 4 days. Take 2 tablets (500 mg) on  Day 1,  followed by 1 tablet (250 mg) once daily on Days 2 through 5. What changed:  how much to take how to take this when to take this additional instructions   benzonatate  100 MG capsule Commonly known as: Tessalon  Perles Take 2 capsules (200 mg total) by mouth 3 (three) times daily as needed for cough.   chlorpheniramine-HYDROcodone  10-8 MG/5ML Commonly known as: TUSSIONEX Take 5 mLs by mouth at bedtime as needed for up to 6 days for cough.   citalopram  40 MG tablet Commonly known as: CeleXA  Take 1 tablet (40 mg total) by mouth daily.   EPINEPHrine  0.3 mg/0.3 mL Soaj injection Commonly known as: EPI-PEN Inject 0.3 mg into the muscle as needed for anaphylaxis. Follow package instructions as needed for severe allergy or anaphylactic reaction.   ferrous gluconate  324 MG tablet Commonly known as: FERGON Take 1 tablet (324 mg total) by mouth daily with breakfast.   fluticasone  44 MCG/ACT inhaler Commonly known as: FLOVENT   HFA Inhale 1 puff into the lungs daily.   folic acid 400 MCG tablet Commonly known as: FOLVITE Take 400 mcg by mouth daily.   gabapentin  100 MG capsule Commonly known as: NEURONTIN  Take 100 mg by mouth at bedtime. What changed: Another medication with the same name was removed. Continue taking this medication, and follow the directions you see here.   guaiFENesin  600 MG 12 hr tablet Commonly known as: MUCINEX  Take 2 tablets (1,200 mg total) by mouth 2 (two) times daily for 21 days.   linaclotide  290 MCG Caps capsule Commonly known as: Linzess  Take 1 capsule (290 mcg total) by mouth daily before breakfast.   methotrexate 10 MG tablet Commonly known as: RHEUMATREX Take 60 mg by mouth once a week. Caution: Chemotherapy. Protect from light.   nicotine  21 mg/24hr patch Commonly known as: NICODERM CQ  - dosed in mg/24 hours  Place 1 patch (21 mg total) onto the skin daily.   ondansetron  4 MG disintegrating tablet Commonly known as: ZOFRAN -ODT Take 1 tablet (4 mg total) by mouth every 8 (eight) hours as needed for nausea or vomiting.   pantoprazole  40 MG tablet Commonly known as: PROTONIX  Take 1 tablet (40 mg total) by mouth 2 (two) times daily.   Pirfenidone 267 MG Caps Take 1 capsule by mouth in the morning, at noon, and at bedtime.   predniSONE  20 MG tablet Commonly known as: DELTASONE  Take 2 tablets (40 mg total) by mouth daily with breakfast for 7 days, THEN 1 tablet (20 mg total) daily with breakfast for 5 days. Start taking on: Jul 27, 2023 What changed: See the new instructions.   RA Calcium  600/Vitamin D-3 600-10 MG-MCG Tabs Generic drug: Calcium  Carb-Cholecalciferol  RA CALCIUM  600/VITAMIN D-3 600-10 MG-MCG TABS        Discharge Exam: Filed Weights   07/23/23 2009 07/23/23 2114  Weight: 65.8 kg 65.8 kg   ***  Condition at discharge: {DC Condition:26389}  The results of significant diagnostics from this hospitalization (including imaging, microbiology,  ancillary and laboratory) are listed below for reference.   Imaging Studies: CT Chest High Resolution Result Date: 07/24/2023 CLINICAL DATA:  Interstitial lung disease evaluation. History of rheumatoid arthritis. EXAM: CT CHEST WITHOUT CONTRAST TECHNIQUE: Multidetector CT imaging of the chest was performed following the standard protocol without intravenous contrast. High resolution imaging of the lungs, as well as inspiratory and expiratory imaging, was performed. RADIATION DOSE REDUCTION: This exam was performed according to the departmental dose-optimization program which includes automated exposure control, adjustment of the mA and/or kV according to patient size and/or use of iterative reconstruction technique. COMPARISON:  CT angio chest 05/04/2022 FINDINGS: Cardiovascular: Heart size appears normal. Aortic atherosclerosis. Coronary artery calcifications. No pericardial effusion. Mediastinum/Nodes: Thyroid gland and trachea appear normal. The esophagus appears nondilated. Small hiatal hernia. Hilar lymph nodes are suboptimally evaluated without IV contrast material. No mediastinal or axillary adenopathy. Lungs/Pleura: No pleural effusion identified. No airspace consolidation. Progressive moderate Paraseptal emphysema in the bilateral upper lobes and moderate to severe reticular opacities and honeycombing in the bilateral lower lobes. Bilateral lower lung zone increased peribronchovascular soft tissue noted. Diffuse bronchial wall thickening is noted. Equivocal traction bronchiectasis identified within the lower lung zones. Multifocal patchy ground-glass opacities are identified within both lungs, most severe in the lower lung zones. No suspicious pulmonary nodule or mass identified. Calcified granuloma in the left upper lobe. Upper Abdomen: No acute findings within the imaged portions of the upper abdomen. Musculoskeletal: No chest wall mass or suspicious bone lesions identified. IMPRESSION: 1. 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 Superimposed elements of airways disease and emphysema noted. Differential considerations include combined pulmonary fibrosis and emphysema (CP FD), nonspecific interstitial pneumonia (NSIP), or drug induced lung disease (correlate for history of methotrexate usage.). 2. Ground-glass opacities within both lungs with bronchial wall thickening and increased peribronchovascular soft tissue may reflect active inflammation or overlapping airways disease such as bronchiolitis or organizing pneumonia (both may be seen in rheumatoid arthritis). 3. No pleural effusion or consolidative change. 4. Coronary artery calcifications. 5.  Aortic Atherosclerosis (ICD10-I70.0). Electronically Signed   By: Kimberley Penman M.D.   On: 07/24/2023 11:26   DG Chest Port 1 View Result Date: 07/23/2023 CLINICAL DATA:  [id number] Suspected sepsis [id number] EXAM: PORTABLE CHEST 1 VIEW COMPARISON:  Radiograph 11/28/22, CT 05/04/22 FINDINGS:  Normal heart size and mediastinal contours. Basilar predominant interstitial lung disease. Slight increased density in the left greater right lower lobe may represent superimposed infection. No pleural effusion, pneumothorax or pulmonary edema. IMPRESSION: Basilar predominant interstitial lung disease. Slight increased density in the left greater right lower lobe may represent superimposed infection. Electronically Signed   By: Chadwick Colonel M.D.   On: 07/23/2023 21:58    Microbiology: Results for orders placed or performed during the hospital encounter of 07/23/23  Culture, blood (Routine x 2)     Status: None   Collection Time: 07/23/23  8:13 PM   Specimen: BLOOD  Result Value Ref Range Status   Specimen Description BLOOD RIGHT ANTECUBITAL  Final   Special Requests   Final    BOTTLES DRAWN AEROBIC AND ANAEROBIC Blood Culture adequate  volume   Culture   Final    NO GROWTH 5 DAYS Performed at [facility], [address] [place], [place] [address]    Report Status 07/28/2023 FINAL  Final  Resp panel by RT-PCR (RSV, Flu A&B, Covid) Urine, Clean Catch     Status: None   Collection Time: 07/23/23  8:14 PM   Specimen: Urine, Clean Catch; Nasal Swab  Result Value Ref Range Status   SARS Coronavirus 2 by RT PCR NEGATIVE NEGATIVE Final    Comment: (NOTE) SARS-CoV-2 target nucleic acids are NOT DETECTED.  The SARS-CoV-2 RNA is generally detectable in upper respiratory specimens during the acute phase of infection. The lowest concentration of SARS-CoV-2 viral copies this assay can detect is 138 copies/mL. A negative result does not preclude SARS-Cov-2 infection and should not be used as the sole basis for treatment or other patient management decisions. A negative result may occur with  improper specimen collection/handling, submission of specimen other than nasopharyngeal swab, presence of viral mutation(s) within the areas targeted by this assay, and inadequate number of viral copies(<138 copies/mL). A negative result must be combined with clinical observations, patient history, and epidemiological information. The expected result is Negative.  Fact Sheet for Patients:  BloggerCourse.com  Fact Sheet for Healthcare Providers:  SeriousBroker.it  This test is no t yet approved or cleared by the [place]  FDA and  has been authorized for detection and/or diagnosis of SARS-CoV-2 by FDA under an Emergency Use Authorization (EUA). This EUA will remain  in effect (meaning this test can be used) for the duration of the COVID-19 declaration under Section 564(b)(1) of the Act, 21 U.S.C.section 360bbb-3(b)(1), unless the authorization is terminated  or revoked sooner.       Influenza A by PCR NEGATIVE NEGATIVE Final   Influenza B by PCR NEGATIVE NEGATIVE Final     Comment: (NOTE) The Xpert Xpress SARS-CoV-2/FLU/RSV plus assay is intended as an aid in the diagnosis of influenza from Nasopharyngeal swab specimens and should not be used as a sole basis for treatment. Nasal washings and aspirates are unacceptable for Xpert Xpress SARS-CoV-2/FLU/RSV testing.  Fact Sheet for Patients: BloggerCourse.com  Fact Sheet for Healthcare Providers: SeriousBroker.it  This test is not yet approved or cleared by the [place]  FDA and has been authorized for detection and/or diagnosis of SARS-CoV-2 by FDA under an Emergency Use Authorization (EUA). This EUA will remain in effect (meaning this test can be used) for the duration of the COVID-19 declaration under Section 564(b)(1) of the Act, 21 U.S.C. section 360bbb-3(b)(1), unless the authorization is terminated or revoked.     Resp Syncytial Virus by PCR NEGATIVE NEGATIVE Final  Comment: (NOTE) Fact Sheet for Patients: BloggerCourse.com  Fact Sheet for Healthcare Providers: SeriousBroker.it  This test is not yet approved or cleared by the [place]  FDA and has been authorized for detection and/or diagnosis of SARS-CoV-2 by FDA under an Emergency Use Authorization (EUA). This EUA will remain in effect (meaning this test can be used) for the duration of the COVID-19 declaration under Section 564(b)(1) of the Act, 21 U.S.C. section 360bbb-3(b)(1), unless the authorization is terminated or revoked.  Performed at [facility], [address] [place], [place] [address]   Culture, blood (Routine x 2)     Status: None (Preliminary result)   Collection Time: 07/24/23  2:55 AM   Specimen: BLOOD  Result Value Ref Range Status   Specimen Description BLOOD BLOOD RIGHT HAND  Final   Special Requests   Final    BOTTLES DRAWN AEROBIC AND ANAEROBIC Blood Culture results may not be optimal due  to an excessive volume of blood received in culture bottles   Culture   Final    NO GROWTH 4 DAYS Performed at [facility], [address] [place], [place] [address]    Report Status PENDING  Incomplete    Labs: CBC: Recent Labs  Lab 07/23/23 2013 07/24/23 0333 07/25/23 0536 07/26/23 0900  WBC 14.8* 13.2* 19.7* 17.3*  NEUTROABS 9.6*  --   --  12.3*  HGB 13.1 11.5* 10.3* 11.4*  HCT 40.6 35.7* 31.6* 35.8*  MCV 88.5 89.0 88.8 90.2  PLT 513* 435* 431* 470*   Basic Metabolic Panel: Recent Labs  Lab 07/23/23 2013 07/25/23 0536 07/26/23 0900  NA 138 142 141  K 3.8 3.4* 3.4*  CL 101 108 107  CO2 24 25 25   GLUCOSE 88 114* 149*  BUN 14 14 18   CREATININE 0.65 0.63 0.52  CALCIUM  9.0 8.5* 8.5*   Liver Function Tests: Recent Labs  Lab 07/23/23 2013  AST 18  ALT 21  ALKPHOS 78  BILITOT 0.4  PROT 7.1  ALBUMIN 3.5   CBG: No results for input(s): "GLUCAP" in the last 168 hours.  Discharge time spent: {LESS THAN/GREATER QION:62952} 30 minutes.  Signed: Joette Mustard, MD [facility] 07/28/2023

## 2023-07-28 NOTE — Discharge Summary (Signed)
 Physician Discharge Summary   Patient: Alexandra [name] MRN: [id number] DOB: 1970/10/10  Admit date:     07/23/2023  Discharge date: 07/26/2023  Discharge Physician: Joette Mustard   PCP: Mimi Alt, MD   Recommendations at discharge:    Work up mild anemia outpatient  Currently not on PJP prophylaxis per patient report, decide if this needs to be resumed   Discharge Diagnoses: Principal Problem:   CAP (community acquired pneumonia) Active Problems:   Sepsis (HCC)   Pulmonary fibrosis (HCC)   Tobacco use   Recurrent depression (HCC)   ANCA-associated vasculitis (HCC)   History of arterial thrombosis - aortic thrombus   Status post below-knee amputation of right lower extremity (HCC)   Chronic anticoagulation   At risk for infection due to immunosuppression   Erosive gastritis  Resolved Problems:   * No resolved hospital problems. *  Hospital Course: HPI per admission H&P Alexandra [name] is a 53 y.o. 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  She saw her PCP on 5/5 with an 8-day complaint of cough, congestion and wheezing without fever.  She was treated with a Z-Pak, albuterol  and prednisone  however symptoms of shortness of breath continued to worsen, and she developed fever and chills. ED course and data review: febrile at 101.1 with pulse 111, RR 28 and BP 117/58. O2 sats 99% Labs notable for WBC 15,000 with lactic acid 2.1 Respiratory viral panel negative, UA negative CMP normal EKG, personally viewed and interpreted with sinus tachycardia and nonspecific ST-T wave changes   Chest x-ray  showing possible infection "Basilar predominant interstitial lung disease. 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 Superimposed elements of airways disease and emphysema noted. Ground-glass opacities within both lungs with bronchial wall thickening and increased peribronchovascular soft tissue may reflect active inflammation or overlapping airways disease such as bronchiolitis or organizing pneumonia (both may be seen in rheumatoid arthritis).  Patient started on Rocephin  and azithromycin  also given Solu-Medrol  and DuoNebs and IV fluids. She was eventually transitioned to cefepime  and azithromycin .  Pulmonology was consulted and patient was thought to have an exacerbation of her underlying ILD. She was continued on antibiotics and steroids. She was discharged on antibiotics and predinsone taper and will have close follow up with pulmonology. Tobacco cessation was discussed with patient and she was given prescription for nicotine  patches.      Significant Labs: WBC 14.8, HgB 13.1, plt 513 Na 138, K 3.8, CO2 of 24, BUN 14, scr 0.64, glu 88 Lactic acid 2.1 Covid/flu/rsv negative UA negative  Significant Imaging Studies: CXR Basilar predominant interstitial lung disease. Slight increased density in the left greater right lower lobe may represent superimposed infection.  CT Chest High Resolution Result Date: 07/24/2023 CLINICAL DATA:  Interstitial lung disease evaluation. History of rheumatoid arthritis. EXAM: CT CHEST WITHOUT CONTRAST TECHNIQUE: Multidetector CT imaging of the chest was performed following the standard protocol without intravenous contrast. High resolution imaging of the lungs, as well as inspiratory and expiratory imaging, was  performed. RADIATION DOSE REDUCTION: This  exam was performed according to the departmental dose-optimization program which includes automated exposure control, adjustment of the mA and/or kV according to patient size and/or use of iterative reconstruction technique. COMPARISON:  CT angio chest 05/04/2022 FINDINGS: Cardiovascular: Heart size appears normal. Aortic atherosclerosis. Coronary artery calcifications. No pericardial effusion. Mediastinum/Nodes: Thyroid gland and trachea appear normal. The esophagus appears nondilated. Small hiatal hernia. Hilar lymph nodes are suboptimally evaluated without IV contrast material. No mediastinal or axillary adenopathy. Lungs/Pleura: No pleural effusion identified. No airspace consolidation. Progressive moderate Paraseptal emphysema in the bilateral upper lobes and moderate to severe reticular opacities and honeycombing in the bilateral lower lobes. Bilateral lower lung zone increased peribronchovascular soft tissue noted. Diffuse bronchial wall thickening is noted. Equivocal traction bronchiectasis identified within the lower lung zones. Multifocal patchy ground-glass opacities are identified within both lungs, most severe in the lower lung zones. No suspicious pulmonary nodule or mass identified. Calcified granuloma in the left upper lobe. Upper Abdomen: No acute findings within the imaged portions of the upper abdomen. Musculoskeletal: No chest wall mass or suspicious bone lesions identified. IMPRESSION: 1. 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 Superimposed elements of airways disease and emphysema noted. Differential considerations include combined pulmonary fibrosis and emphysema (CP FD), nonspecific interstitial pneumonia (NSIP), or drug induced lung disease (correlate for history of methotrexate usage.). 2. Ground-glass opacities within  both lungs with bronchial wall thickening and increased peribronchovascular soft tissue may reflect active inflammation or overlapping airways disease such as bronchiolitis or organizing pneumonia (both may be seen in rheumatoid arthritis). 3. No pleural effusion or consolidative change. 4. Coronary artery calcifications. 5.  Aortic Atherosclerosis (ICD10-I70.0). Electronically Signed   By: Kimberley Penman M.D.   On: 07/24/2023 11:26   DG Chest Port 1 View Result Date: 07/23/2023 CLINICAL DATA:  [id number] Suspected sepsis [id number] EXAM: PORTABLE CHEST 1 VIEW COMPARISON:  Radiograph 11/28/22, CT 05/04/22 FINDINGS: Normal heart size and mediastinal contours. Basilar predominant interstitial lung disease. Slight increased density in the left greater right lower lobe may represent superimposed infection. No pleural effusion, pneumothorax or pulmonary edema. IMPRESSION: Basilar predominant interstitial lung disease. Slight increased density in the left greater right lower lobe may represent superimposed infection. Electronically Signed   By: Chadwick Colonel M.D.   On: 07/23/2023 21:58     Antibiotic Therapy: Anti-infectives (From admission, onward)    Start     Dose/Rate Route Frequency Ordered Stop   07/25/23 0900  sulfamethoxazole -trimethoprim  (BACTRIM  DS) 800-160 MG per tablet 1 tablet  Status:  Discontinued        1 tablet Oral Once per day on Monday Wednesday Friday 07/24/23 0058 07/24/23 0234   07/25/23 0600  Vancomycin  (VANCOCIN ) 1,250 mg in sodium chloride  0.9 % 250 mL IVPB  Status:  Discontinued        1,250 mg 166.7 mL/hr over 90 Minutes Intravenous  Once 07/24/23 0135 07/24/23 0136   07/25/23 0600  Vancomycin  (VANCOCIN ) 1,250 mg in sodium chloride  0.9 % 250 mL IVPB        1,250 mg 166.7 mL/hr over 90 Minutes Intravenous Every 24 hours 07/24/23 0136     07/24/23 0600  ceFEPIme  (MAXIPIME ) 2 g in sodium chloride  0.9 % 100 mL IVPB        2 g 200 mL/hr over 30 Minutes Intravenous Every 8 hours  07/24/23 0134     07/24/23 0230  vancomycin  (VANCOREADY) IVPB 1500 mg/300 mL  1,500 mg 150 mL/hr over 120 Minutes Intravenous  Once 07/24/23 0134 07/24/23 0504   07/23/23 2145  cefTRIAXone  (ROCEPHIN ) 2 g in sodium chloride  0.9 % 100 mL IVPB        2 g 200 mL/hr over 30 Minutes Intravenous  Once 07/23/23 2130 07/23/23 2224   07/23/23 2145  azithromycin  (ZITHROMAX ) 500 mg in sodium chloride  0.9 % 250 mL IVPB        500 mg 250 mL/hr over 60 Minutes Intravenous  Once 07/23/23 2130 07/23/23 2359       Procedures: None  Consultants: Pulmonology   Assessment and Plan: Acute exacerbation of ILD CAP (community acquired pneumonia) Sepsis POA Sepsis criteria include fever, tachycardia and tachypnea She was treated with antibiotics and steroids. She had rapid improvement in her symptoms.  Patient was continued on steroids and antibiotics on discharge.    H/o ANCA vasculitis  H/o GPA Methotrexate and pirfenidone were continued on discharge.   Erosive gastritis  PPI was continued.   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  Pt fractured her femur and is only able to wear her prosthesis 2-3 times a week.   History of arterial thrombosis - aortic thrombus History of aortic thrombus s/p thrombectomy and bilateral common iliac artery stent S/p right AKA secondary to critical limb ischemia Continued apixaban , atorvastatin , aspirin     Recurrent depression  continued celexa .   Tobacco use Continued Nicotine  patch   Chronic constipation Continued Linzess        Consultants: Pulmonology Procedures performed: None  Disposition: Home Diet recommendation:  Regular diet DISCHARGE MEDICATION: Allergies as of 07/26/2023   No Known Allergies      Medication List     PAUSE taking these medications    midodrine  5 MG tablet Wait to take this until your doctor or other care provider tells you to start again. Commonly  known as: PROAMATINE  Take 1 tablet (5 mg total) by mouth 3 (three) times daily with meals.   predniSONE  2.5 MG tablet Wait to take this until your doctor or other care provider tells you to start again. Commonly known as: DELTASONE  Take 2.5 mg by mouth daily with breakfast. You also have another medication with the same name that you may need to continue taking.       STOP taking these medications    acetaminophen -codeine  300-30 MG tablet Commonly known as: TYLENOL  #3   calcium  carbonate 1500 (600 Ca) MG Tabs tablet Commonly known as: OSCAL   Magnesium  Citrate 100 MG Caps   promethazine -dextromethorphan 6.25-15 MG/5ML syrup Commonly known as: PROMETHAZINE -DM   sulfamethoxazole -trimethoprim  800-160 MG tablet Commonly known as: BACTRIM  DS       TAKE these medications    albuterol  108 (90 Base) MCG/ACT inhaler Commonly known as: VENTOLIN  HFA Inhale 1-2 puffs into the lungs every 6 (six) hours as needed for wheezing or shortness of breath (cough).   amoxicillin -clavulanate 875-125 MG tablet Commonly known as: AUGMENTIN  Take 1 tablet by mouth 2 (two) times daily for 7 days.   apixaban  5 MG Tabs tablet Commonly known as: ELIQUIS  Take 1 tablet (5 mg total) by mouth 2 (two) times daily.   aspirin  EC 81 MG tablet Take 1 tablet (81 mg total) by mouth daily. Swallow whole.   atorvastatin  40 MG tablet Commonly known as: LIPITOR Take 1 tablet (40 mg total) by mouth daily.   azithromycin  250 MG tablet Commonly known as: Zithromax  Z-Pak Take 1 tablet (250 mg total)  by mouth daily for 4 days. Take 2 tablets (500 mg) on  Day 1,  followed by 1 tablet (250 mg) once daily on Days 2 through 5. What changed:  how much to take how to take this when to take this additional instructions   benzonatate  100 MG capsule Commonly known as: Tessalon  Perles Take 2 capsules (200 mg total) by mouth 3 (three) times daily as needed for cough.   chlorpheniramine-HYDROcodone  10-8  MG/5ML Commonly known as: TUSSIONEX Take 5 mLs by mouth at bedtime as needed for up to 6 days for cough.   citalopram  40 MG tablet Commonly known as: CeleXA  Take 1 tablet (40 mg total) by mouth daily.   EPINEPHrine  0.3 mg/0.3 mL Soaj injection Commonly known as: EPI-PEN Inject 0.3 mg into the muscle as needed for anaphylaxis. Follow package instructions as needed for severe allergy or anaphylactic reaction.   ferrous gluconate  324 MG tablet Commonly known as: FERGON Take 1 tablet (324 mg total) by mouth daily with breakfast.   fluticasone  44 MCG/ACT inhaler Commonly known as: FLOVENT  HFA Inhale 1 puff into the lungs daily.   folic acid 400 MCG tablet Commonly known as: FOLVITE Take 400 mcg by mouth daily.   gabapentin  100 MG capsule Commonly known as: NEURONTIN  Take 100 mg by mouth at bedtime. What changed: Another medication with the same name was removed. Continue taking this medication, and follow the directions you see here.   guaiFENesin  600 MG 12 hr tablet Commonly known as: MUCINEX  Take 2 tablets (1,200 mg total) by mouth 2 (two) times daily for 21 days.   linaclotide  290 MCG Caps capsule Commonly known as: Linzess  Take 1 capsule (290 mcg total) by mouth daily before breakfast.   methotrexate 10 MG tablet Commonly known as: RHEUMATREX Take 60 mg by mouth once a week. Caution: Chemotherapy. Protect from light.   nicotine  21 mg/24hr patch Commonly known as: NICODERM CQ  - dosed in mg/24 hours Place 1 patch (21 mg total) onto the skin daily.   ondansetron  4 MG disintegrating tablet Commonly known as: ZOFRAN -ODT Take 1 tablet (4 mg total) by mouth every 8 (eight) hours as needed for nausea or vomiting.   pantoprazole  40 MG tablet Commonly known as: PROTONIX  Take 1 tablet (40 mg total) by mouth 2 (two) times daily.   Pirfenidone 267 MG Caps Take 1 capsule by mouth in the morning, at noon, and at bedtime.   predniSONE  20 MG tablet Commonly known as:  DELTASONE  Take 2 tablets (40 mg total) by mouth daily with breakfast for 7 days, THEN 1 tablet (20 mg total) daily with breakfast for 5 days. Start taking on: Jul 27, 2023 What changed: See the new instructions.   RA Calcium  600/Vitamin D-3 600-10 MG-MCG Tabs Generic drug: Calcium  Carb-Cholecalciferol  RA CALCIUM  600/VITAMIN D-3 600-10 MG-MCG TABS        Discharge Exam: Filed Weights   07/23/23 2009 07/23/23 2114  Weight: 65.8 kg 65.8 kg   Physical Exam  Constitutional: In no distress.  Cardiovascular: Normal rate, regular rhythm. No lower extremity edema  Pulmonary: Non labored breathing on room air, no wheezing. rales. At bases   Abdominal: Soft. Normal bowel sounds. Non distended and non tender Musculoskeletal: s/p R aka    Neurological: Alert and oriented to person, place, and time. Non focal  Skin: Skin is warm and dry.    Condition at discharge: improving  The results of significant diagnostics from this hospitalization (including imaging, microbiology, ancillary and laboratory) are listed below  for reference.   Imaging Studies: CT Chest High Resolution Result Date: 07/24/2023 CLINICAL DATA:  Interstitial lung disease evaluation. History of rheumatoid arthritis. EXAM: CT CHEST WITHOUT CONTRAST TECHNIQUE: Multidetector CT imaging of the chest was performed following the standard protocol without intravenous contrast. High resolution imaging of the lungs, as well as inspiratory and expiratory imaging, was performed. RADIATION DOSE REDUCTION: This exam was performed according to the departmental dose-optimization program which includes automated exposure control, adjustment of the mA and/or kV according to patient size and/or use of iterative reconstruction technique. COMPARISON:  CT angio chest 05/04/2022 FINDINGS: Cardiovascular: Heart size appears normal. Aortic atherosclerosis. Coronary artery calcifications. No pericardial effusion. Mediastinum/Nodes: Thyroid gland and  trachea appear normal. The esophagus appears nondilated. Small hiatal hernia. Hilar lymph nodes are suboptimally evaluated without IV contrast material. No mediastinal or axillary adenopathy. Lungs/Pleura: No pleural effusion identified. No airspace consolidation. Progressive moderate Paraseptal emphysema in the bilateral upper lobes and moderate to severe reticular opacities and honeycombing in the bilateral lower lobes. Bilateral lower lung zone increased peribronchovascular soft tissue noted. Diffuse bronchial wall thickening is noted. Equivocal traction bronchiectasis identified within the lower lung zones. Multifocal patchy ground-glass opacities are identified within both lungs, most severe in the lower lung zones. No suspicious pulmonary nodule or mass identified. Calcified granuloma in the left upper lobe. Upper Abdomen: No acute findings within the imaged portions of the upper abdomen. Musculoskeletal: No chest wall mass or suspicious bone lesions identified. IMPRESSION: 1. 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 Superimposed elements of airways disease and emphysema noted. Differential considerations include combined pulmonary fibrosis and emphysema (CP FD), nonspecific interstitial pneumonia (NSIP), or drug induced lung disease (correlate for history of methotrexate usage.). 2. Ground-glass opacities within both lungs with bronchial wall thickening and increased peribronchovascular soft tissue may reflect active inflammation or overlapping airways disease such as bronchiolitis or organizing pneumonia (both may be seen in rheumatoid arthritis). 3. No pleural effusion or consolidative change. 4. Coronary artery calcifications. 5.  Aortic Atherosclerosis (ICD10-I70.0). Electronically Signed   By: Kimberley Penman M.D.   On: 07/24/2023 11:26   DG Chest Port 1 View Result Date:  07/23/2023 CLINICAL DATA:  [id number] Suspected sepsis [id number] EXAM: PORTABLE CHEST 1 VIEW COMPARISON:  Radiograph 11/28/22, CT 05/04/22 FINDINGS: Normal heart size and mediastinal contours. Basilar predominant interstitial lung disease. Slight increased density in the left greater right lower lobe may represent superimposed infection. No pleural effusion, pneumothorax or pulmonary edema. IMPRESSION: Basilar predominant interstitial lung disease. Slight increased density in the left greater right lower lobe may represent superimposed infection. Electronically Signed   By: Chadwick Colonel M.D.   On: 07/23/2023 21:58    Microbiology: Results for orders placed or performed during the hospital encounter of 07/23/23  Culture, blood (Routine x 2)     Status: None   Collection Time: 07/23/23  8:13 PM   Specimen: BLOOD  Result Value Ref Range Status   Specimen Description BLOOD RIGHT ANTECUBITAL  Final   Special Requests   Final    BOTTLES DRAWN AEROBIC AND ANAEROBIC Blood Culture adequate volume   Culture   Final    NO GROWTH 5 DAYS Performed at [facility], [address], [place], [place] [address]    Report Status 07/28/2023 FINAL  Final  Resp panel by RT-PCR (RSV, Flu A&B, Covid) Urine, Clean Catch     Status: None   Collection Time: 07/23/23  8:14 PM   Specimen: Urine, Clean Catch; Nasal Swab  Result Value Ref Range Status   SARS Coronavirus 2 by RT PCR NEGATIVE NEGATIVE Final    Comment: (NOTE) SARS-CoV-2 target nucleic acids are NOT DETECTED.  The SARS-CoV-2 RNA is generally detectable in upper respiratory specimens during the acute phase of infection. The lowest concentration of SARS-CoV-2 viral copies this assay can detect is 138 copies/mL. A negative result does not preclude SARS-Cov-2 infection and should not be used as the sole basis for treatment or other patient management decisions. A negative result may occur with  improper specimen collection/handling, submission of  specimen other than nasopharyngeal swab, presence of viral mutation(s) within the areas targeted by this assay, and inadequate number of viral copies(<138 copies/mL). A negative result must be combined with clinical observations, patient history, and epidemiological information. The expected result is Negative.  Fact Sheet for Patients:  BloggerCourse.com  Fact Sheet for Healthcare Providers:  SeriousBroker.it  This test is no t yet approved or cleared by the [place]  FDA and  has been authorized for detection and/or diagnosis of SARS-CoV-2 by FDA under an Emergency Use Authorization (EUA). This EUA will remain  in effect (meaning this test can be used) for the duration of the COVID-19 declaration under Section 564(b)(1) of the Act, 21 U.S.C.section 360bbb-3(b)(1), unless the authorization is terminated  or revoked sooner.       Influenza A by PCR NEGATIVE NEGATIVE Final   Influenza B by PCR NEGATIVE NEGATIVE Final    Comment: (NOTE) The Xpert Xpress SARS-CoV-2/FLU/RSV plus assay is intended as an aid in the diagnosis of influenza from Nasopharyngeal swab specimens and should not be used as a sole basis for treatment. Nasal washings and aspirates are unacceptable for Xpert Xpress SARS-CoV-2/FLU/RSV testing.  Fact Sheet for Patients: BloggerCourse.com  Fact Sheet for Healthcare Providers: SeriousBroker.it  This test is not yet approved or cleared by the [place]  FDA and has been authorized for detection and/or diagnosis of SARS-CoV-2 by FDA under an Emergency Use Authorization (EUA). This EUA will remain in effect (meaning this test can be used) for the duration of the COVID-19 declaration under Section 564(b)(1) of the Act, 21 U.S.C. section 360bbb-3(b)(1), unless the authorization is terminated or revoked.     Resp Syncytial Virus by PCR NEGATIVE NEGATIVE Final     Comment: (NOTE) Fact Sheet for Patients: BloggerCourse.com  Fact Sheet for Healthcare Providers: SeriousBroker.it  This test is not yet approved or cleared by the [place]  FDA and has been authorized for detection and/or diagnosis of SARS-CoV-2 by FDA under an Emergency Use Authorization (EUA). This EUA will remain in effect (meaning this test can be used) for the duration of the COVID-19 declaration under Section 564(b)(1) of the Act, 21 U.S.C. section 360bbb-3(b)(1), unless the authorization is terminated or revoked.  Performed at [facility], [address] [place], [place] [address]   Culture, blood (Routine x 2)     Status: None (Preliminary result)   Collection Time: 07/24/23  2:55 AM   Specimen: BLOOD  Result Value Ref Range Status   Specimen Description BLOOD BLOOD RIGHT HAND  Final   Special Requests   Final    BOTTLES DRAWN AEROBIC AND ANAEROBIC Blood Culture results may not be optimal due to an excessive volume of blood received in culture bottles   Culture   Final    NO GROWTH 4 DAYS Performed at [facility], [address] [place], [place] [address]  Report Status PENDING  Incomplete    Labs: CBC: Recent Labs  Lab 07/23/23 2013 07/24/23 0333 07/25/23 0536 07/26/23 0900  WBC 14.8* 13.2* 19.7* 17.3*  NEUTROABS 9.6*  --   --  12.3*  HGB 13.1 11.5* 10.3* 11.4*  HCT 40.6 35.7* 31.6* 35.8*  MCV 88.5 89.0 88.8 90.2  PLT 513* 435* 431* 470*   Basic Metabolic Panel: Recent Labs  Lab 07/23/23 2013 07/25/23 0536 07/26/23 0900  NA 138 142 141  K 3.8 3.4* 3.4*  CL 101 108 107  CO2 24 25 25   GLUCOSE 88 114* 149*  BUN 14 14 18   CREATININE 0.65 0.63 0.52  CALCIUM  9.0 8.5* 8.5*   Liver Function Tests: Recent Labs  Lab 07/23/23 2013  AST 18  ALT 21  ALKPHOS 78  BILITOT 0.4  PROT 7.1  ALBUMIN 3.5   CBG: No results for input(s): "GLUCAP" in the last 168  hours.  Discharge time spent: greater than 30 minutes.  Signed: Joette Mustard, MD [facility] 07/28/2023

## 2023-07-29 LAB — CULTURE, BLOOD (ROUTINE X 2): Culture: NO GROWTH

## 2023-08-02 ENCOUNTER — Encounter (INDEPENDENT_AMBULATORY_CARE_PROVIDER_SITE_OTHER): Payer: Self-pay

## 2023-08-19 ENCOUNTER — Encounter: Payer: Self-pay | Admitting: Cardiovascular Disease

## 2023-08-29 ENCOUNTER — Other Ambulatory Visit: Payer: Self-pay | Admitting: Family Medicine

## 2023-08-29 DIAGNOSIS — B9689 Other specified bacterial agents as the cause of diseases classified elsewhere: Secondary | ICD-10-CM

## 2023-09-20 DIAGNOSIS — Z1331 Encounter for screening for depression: Secondary | ICD-10-CM | POA: Diagnosis not present

## 2023-09-20 DIAGNOSIS — M0560 Rheumatoid arthritis of unspecified site with involvement of other organs and systems: Secondary | ICD-10-CM | POA: Diagnosis not present

## 2023-09-20 DIAGNOSIS — F17210 Nicotine dependence, cigarettes, uncomplicated: Secondary | ICD-10-CM | POA: Diagnosis not present

## 2023-09-20 DIAGNOSIS — J8410 Pulmonary fibrosis, unspecified: Secondary | ICD-10-CM | POA: Diagnosis not present

## 2023-09-26 ENCOUNTER — Other Ambulatory Visit: Payer: Self-pay | Admitting: Cardiovascular Disease

## 2023-09-26 NOTE — Telephone Encounter (Signed)
 Pt's pharmacy is requesting a refill on medication midodrine , this medication is on pause. Would Dr. Gollan like for pt to resume this medication? Please address

## 2023-09-26 NOTE — Telephone Encounter (Signed)
Medication is on hold

## 2023-09-28 DIAGNOSIS — I7782 Antineutrophilic cytoplasmic antibody (ANCA) vasculitis: Secondary | ICD-10-CM | POA: Diagnosis not present

## 2023-09-28 DIAGNOSIS — M0579 Rheumatoid arthritis with rheumatoid factor of multiple sites without organ or systems involvement: Secondary | ICD-10-CM | POA: Diagnosis not present

## 2023-09-30 ENCOUNTER — Telehealth: Payer: Self-pay | Admitting: Cardiovascular Disease

## 2023-09-30 MED ORDER — MIDODRINE HCL 5 MG PO TABS: 5.0000 mg | ORAL_TABLET | Freq: Every day | ORAL | 1 refills | Status: DC

## 2023-09-30 NOTE — Telephone Encounter (Signed)
 Called patient and notified her of the following from Bernardino Bring, PA-C.   Okay to refill midodrine  5 mg once daily with refills to get patient to scheduled appointment.  Do not lay down once taken in an effort to minimize risk of supine hypertension.     Patient verbalizes understanding. Prescription sent to preferred pharmacy.

## 2023-09-30 NOTE — Telephone Encounter (Signed)
*  STAT* If patient is at the pharmacy, call can be transferred to refill team.   1. Which medications need to be refilled? (please list name of each medication and dose if known) midodrine  (PROAMATINE ) 5 MG tablet (Paused)    2. Would you like to learn more about the convenience, safety, & potential cost savings by using the [facility]?    3. Are you open to using the [facility] (Type [facility].  ).   4. Which pharmacy/location (including street and city if local pharmacy) is medication to be sent to? [id number] - [place], [place] - [address] AT [facility] OF [address]    5. Do they need a 30 day or 90 day supply? 90 day

## 2023-09-30 NOTE — Telephone Encounter (Signed)
 Okay to refill midodrine  5 mg once daily with refills to get patient to scheduled appointment.  Do not lay down once taken in an effort to minimize risk of supine hypertension.

## 2023-09-30 NOTE — Telephone Encounter (Signed)
 I called the patient- she states she was unaware this medication was on hold. She has continuing taking it, called last week to get a refill and has been without it for 1 week. She states she feels bad, she is really lightheaded- she had an office visit recently (in epic) noted to have BP of 98/62. She states she can tell when the BP is low because she feels this way. This past week she has felt that same way. She has felt fine since being home from the hospital, and continue to take Midodrine  5 mg once daily- not three times daily. She states she did make an appointment for the next opening with MD in October (I looked for sooner, but nothing avaiable) she does have transportation needs as she has to have someone bring her due to being an Amputee, she was just questioning if she could get enough medications until this visit. Advised I would send a message back with this clarification.   Thanks!    Will send to primary MD as well.  Thanks!

## 2023-09-30 NOTE — Telephone Encounter (Signed)
 This medication was stopped during admission in 07/2023.  I last saw the patient in 10/2022 for preoperative cardiac risk stratification, not dizziness, and did not weigh in on midodrine  use.  Recommend an appointment be scheduled with her primary cardiologist to reassess her symptoms and determine if medication is still needed.

## 2023-10-12 ENCOUNTER — Telehealth: Payer: Self-pay | Admitting: Cardiovascular Disease

## 2023-10-12 ENCOUNTER — Emergency Department
Admission: EM | Admit: 2023-10-12 | Discharge: 2023-10-12 | Discharge status: Against Medical Advice | Attending: Emergency Medicine | Admitting: Emergency Medicine

## 2023-10-12 ENCOUNTER — Other Ambulatory Visit: Payer: Self-pay

## 2023-10-12 ENCOUNTER — Emergency Department

## 2023-10-12 DIAGNOSIS — R079 Chest pain, unspecified: Secondary | ICD-10-CM | POA: Diagnosis not present

## 2023-10-12 DIAGNOSIS — R0789 Other chest pain: Secondary | ICD-10-CM | POA: Insufficient documentation

## 2023-10-12 DIAGNOSIS — Z5321 Procedure and treatment not carried out due to patient leaving prior to being seen by health care provider: Secondary | ICD-10-CM | POA: Insufficient documentation

## 2023-10-12 DIAGNOSIS — J849 Interstitial pulmonary disease, unspecified: Secondary | ICD-10-CM | POA: Diagnosis not present

## 2023-10-12 LAB — TROPONIN I (HIGH SENSITIVITY)
Troponin I (High Sensitivity): 3 ng/L (ref <18)
Troponin I (High Sensitivity): 3 ng/L (ref <18)

## 2023-10-12 LAB — BASIC METABOLIC PANEL WITH GFR
Anion gap: 12 (ref 5–15)
BUN: 17 mg/dL (ref 6–20)
CO2: 25 mmol/L (ref 22–32)
Calcium: 8.7 mg/dL — ABNORMAL LOW (ref 8.9–10.3)
Chloride: 103 mmol/L (ref 98–111)
Creatinine, Ser: 0.75 mg/dL (ref 0.44–1.00)
GFR, Estimated: >60 mL/min (ref >60)
Glucose, Bld: 93 mg/dL (ref 70–99)
Potassium: 4.2 mmol/L (ref 3.5–5.1)
Sodium: 140 mmol/L (ref 135–145)

## 2023-10-12 LAB — CBC
HCT: 42.7 % (ref 36.0–46.0)
Hemoglobin: 13.4 g/dL (ref 12.0–15.0)
MCH: 27.7 pg (ref 26.0–34.0)
MCHC: 31.4 g/dL (ref 30.0–36.0)
MCV: 88.2 fL (ref 80.0–100.0)
Platelets: 439 K/uL — ABNORMAL HIGH (ref 150–400)
RBC: 4.84 MIL/uL (ref 3.87–5.11)
RDW: 17.2 % — ABNORMAL HIGH (ref 11.5–15.5)
WBC: 7.5 K/uL (ref 4.0–10.5)
nRBC: 0 % (ref 0.0–0.2)

## 2023-10-12 NOTE — ED Triage Notes (Signed)
 Patient states intermittent left sided chest pain that radiates halfway down left arm; states the pain is worse in the evening when resting.

## 2023-10-12 NOTE — ED Notes (Signed)
 Patient reports her normal BP is low and she took her Midodrine  this AM.

## 2023-10-12 NOTE — Telephone Encounter (Signed)
 Pt c/o of Chest Pain: STAT if active (IN THIS MOMENT) CP, including tightness, pressure, jaw pain, shoulder/upper arm/back pain, SOB, nausea, and vomiting.  1. Are you having CP right now (tightness, pressure, or discomfort)? No, but mainly in the evenings of the day.   2. Are you experiencing any other symptoms (ex. SOB, nausea, vomiting, sweating)? No symptoms right now but when she does feel it, its her ears are ringing, face feels numb and mouth sometimes, off balance, hot flashes, hands are clammy, headache, hands shaky and just not feeling well.    3. How long have you been experiencing CP? About 3 weeks   4. Is your CP continuous or coming and going? Coming and going  5. Have you taken Nitroglycerin? No  6. If CP returns before callback, please consider calling 911. ?

## 2023-10-12 NOTE — Telephone Encounter (Signed)
 Patient called to report intermittent chest pain for the past month. She describes the pain as sharp with occasional tightness, localized to the left side of the chest above the breast. Each episode she reports lasts approximately 5-10 minutes and occurs off and on daily. She also reports the pain radiates halfway down her left arm. The patient is unable to identify any specific precipitating or alleviating factors. She notes a history of pulmonary fibrosis and initially attributed the symptoms to that condition. She also reports she was recently hospitalized last month for pneumonia.  Additionally, the patient reports occasional lightheadedness, dizziness, and a feeling of being off balance. On one occasion, she reported she nearly passed out. She was recently evaluated by her primary care provider, who reportedly noted an irregular heart rhythm. The patient does not monitor her blood pressure or heart rate at home.  She also reports that every night, her face and mouth become numb. These symptoms typically resolve within about two hours after she wakes and begins moving around.  Based on current symptoms, nurse recommended pt report to ER for further evaluations.

## 2023-10-13 ENCOUNTER — Ambulatory Visit: Payer: Self-pay

## 2023-10-13 NOTE — Telephone Encounter (Signed)
 FYI Only or Action Required?: FYI only for provider.  Patient was last seen in primary care on 07/18/2023 by Moishe Chiquita HERO, NP.  Called Nurse Triage reporting Numbness.  Symptoms began about a month ago.  Interventions attempted: Other: Seen in ED yesterday.  Symptoms are: unchanged.  Triage Disposition: See HCP Within 4 Hours (Or PCP Triage)  Patient/caregiver understands and will follow disposition?: Yes   **Referred to ED, will follow-up with PCP after discharge. **     Copied from CRM [id number]. Topic: Clinical - Red Word Triage >> Oct 13, 2023  2:49 PM Donee H wrote: Red Word that prompted transfer to Nurse Triage: Patient is stating she is experiencing face and mouth feel numb and tingling, shakes, she feels off balance, and ringing in ear. Patient states this started about a month ago but just keeps getting worse. Reason for Disposition  [1] Weakness of the face, arm / hand, or leg / foot on one side of the body AND [2] gradual onset (e.g., days to weeks) AND [3] present now  Answer Assessment - Initial Assessment Questions 1. SYMPTOM: What is the main symptom you are concerned about? (e.g., weakness, numbness)      Facial numbness, shaking, off balance, tinnitus.   2. ONSET: When did this start? (e.g., minutes, hours, days; while sleeping)     X 1 month ago  3. LAST NORMAL: When was the last time you (the patient) were normal (no symptoms)?     Over a month ago  4. PATTERN Does this come and go, or has it been constant since it started?  Is it present now?     Intermittent   5. CARDIAC SYMPTOMS: Have you had any of the following symptoms: chest pain, difficulty breathing, palpitations?     No  6. NEUROLOGIC SYMPTOMS: Have you had any of the following symptoms: headache, dizziness, vision loss, double vision, changes in speech, unsteady on your feet?     Unsteady on feet, dizziness  7. OTHER SYMPTOMS: Do you have any other symptoms?     Shaking,  off balance, tinnitus.    Only happens during the night, goes away with ambulation, however today symptoms persist. Seen in ED yesterday, EKG findings were normal. Neuro appt is in September. Symptoms of weakness, dizziness, facial paralysis, memory loss, confusion persists, slow speech. Referred to ED, will follow-up with PCP after discharge.  Protocols used: Neurologic Deficit-A-AH

## 2023-10-13 NOTE — Telephone Encounter (Signed)
 Reviewed telephone report   Agree with urgent evaluation as recommended

## 2023-10-15 ENCOUNTER — Other Ambulatory Visit: Payer: Self-pay | Admitting: Family Medicine

## 2023-10-24 ENCOUNTER — Other Ambulatory Visit: Payer: Self-pay | Admitting: Rheumatology

## 2023-10-24 DIAGNOSIS — R251 Tremor, unspecified: Secondary | ICD-10-CM | POA: Diagnosis not present

## 2023-10-24 DIAGNOSIS — R2689 Other abnormalities of gait and mobility: Secondary | ICD-10-CM

## 2023-10-24 DIAGNOSIS — Z7960 Long term (current) use of unspecified immunomodulators and immunosuppressants: Secondary | ICD-10-CM | POA: Diagnosis not present

## 2023-10-24 DIAGNOSIS — I7782 Antineutrophilic cytoplasmic antibody (ANCA) vasculitis: Secondary | ICD-10-CM

## 2023-10-24 DIAGNOSIS — I7410 Embolism and thrombosis of unspecified parts of aorta: Secondary | ICD-10-CM | POA: Diagnosis not present

## 2023-10-26 ENCOUNTER — Encounter: Payer: Self-pay | Admitting: Rheumatology

## 2023-10-27 DIAGNOSIS — J340 Abscess, furuncle and carbuncle of nose: Secondary | ICD-10-CM | POA: Diagnosis not present

## 2023-10-27 DIAGNOSIS — M792 Neuralgia and neuritis, unspecified: Secondary | ICD-10-CM | POA: Diagnosis not present

## 2023-10-27 DIAGNOSIS — H903 Sensorineural hearing loss, bilateral: Secondary | ICD-10-CM | POA: Diagnosis not present

## 2023-10-29 ENCOUNTER — Ambulatory Visit
Admission: RE | Admit: 2023-10-29 | Discharge: 2023-10-29 | Disposition: A | Discharge status: Home / Self Care | Source: Ambulatory Visit | Attending: Rheumatology | Admitting: Rheumatology

## 2023-10-29 DIAGNOSIS — R2689 Other abnormalities of gait and mobility: Secondary | ICD-10-CM

## 2023-10-29 DIAGNOSIS — R251 Tremor, unspecified: Secondary | ICD-10-CM | POA: Diagnosis not present

## 2023-10-29 DIAGNOSIS — I7782 Antineutrophilic cytoplasmic antibody (ANCA) vasculitis: Secondary | ICD-10-CM

## 2023-11-04 ENCOUNTER — Encounter: Payer: Self-pay | Admitting: Family Medicine

## 2023-11-04 ENCOUNTER — Ambulatory Visit: Admitting: Family Medicine

## 2023-11-04 VITALS — BP 91/56 | HR 65 | Resp 16 | Ht 63.0 in | Wt 146.0 lb

## 2023-11-04 DIAGNOSIS — R251 Tremor, unspecified: Secondary | ICD-10-CM | POA: Diagnosis not present

## 2023-11-04 DIAGNOSIS — R220 Localized swelling, mass and lump, head: Secondary | ICD-10-CM

## 2023-11-04 DIAGNOSIS — F324 Major depressive disorder, single episode, in partial remission: Secondary | ICD-10-CM | POA: Diagnosis not present

## 2023-11-04 DIAGNOSIS — H9313 Tinnitus, bilateral: Secondary | ICD-10-CM

## 2023-11-04 DIAGNOSIS — D509 Iron deficiency anemia, unspecified: Secondary | ICD-10-CM | POA: Diagnosis not present

## 2023-11-04 DIAGNOSIS — F4002 Agoraphobia without panic disorder: Secondary | ICD-10-CM

## 2023-11-04 DIAGNOSIS — R42 Dizziness and giddiness: Secondary | ICD-10-CM

## 2023-11-04 DIAGNOSIS — Z89511 Acquired absence of right leg below knee: Secondary | ICD-10-CM | POA: Diagnosis not present

## 2023-11-04 DIAGNOSIS — H6993 Unspecified Eustachian tube disorder, bilateral: Secondary | ICD-10-CM

## 2023-11-04 DIAGNOSIS — I70229 Atherosclerosis of native arteries of extremities with rest pain, unspecified extremity: Secondary | ICD-10-CM

## 2023-11-04 DIAGNOSIS — R110 Nausea: Secondary | ICD-10-CM

## 2023-11-04 DIAGNOSIS — L299 Pruritus, unspecified: Secondary | ICD-10-CM | POA: Diagnosis not present

## 2023-11-04 DIAGNOSIS — E559 Vitamin D deficiency, unspecified: Secondary | ICD-10-CM

## 2023-11-04 DIAGNOSIS — R4589 Other symptoms and signs involving emotional state: Secondary | ICD-10-CM

## 2023-11-04 DIAGNOSIS — R55 Syncope and collapse: Secondary | ICD-10-CM

## 2023-11-04 MED ORDER — BUSPIRONE HCL 10 MG PO TABS
10.0000 mg | ORAL_TABLET | Freq: Two times a day (BID) | ORAL | 2 refills | Status: DC
Start: 2023-11-04 — End: 2024-01-04

## 2023-11-04 MED ORDER — HYDROXYZINE PAMOATE 25 MG PO CAPS: 25.0000 mg | ORAL_CAPSULE | Freq: Every evening | ORAL | 0 refills | Status: DC | PRN

## 2023-11-04 MED ORDER — GABAPENTIN 300 MG PO CAPS: 300.0000 mg | ORAL_CAPSULE | Freq: Every day | ORAL | 3 refills | Status: DC

## 2023-11-04 MED ORDER — ONDANSETRON 4 MG PO TBDP: 4.0000 mg | ORAL_TABLET | Freq: Three times a day (TID) | ORAL | Status: DC | PRN

## 2023-11-04 MED ORDER — FLUTICASONE PROPIONATE 50 MCG/ACT NA SUSP: 2.0000 | Freq: Every day | NASAL | 6 refills | Status: AC

## 2023-11-04 NOTE — Progress Notes (Signed)
 Established patient visit   Patient: Alexandra [name]   DOB: 08/05/1970   53 y.o. Female  MRN: [id number] Visit Date: 11/04/2023  Today's healthcare provider: Rockie Agent, MD   Chief Complaint  Patient presents with   Hospitalization Follow-up    Hosp f/u with more concerns.( Since 1 month)   Subjective     HPI     Hospitalization Follow-up    Additional comments: Hosp f/u with more concerns.( Since 1 month)      Last edited by Marylen Odella CROME, CMA on 11/04/2023  2:21 PM.       Discussed the use of AI scribe software for clinical note transcription with the patient, who gave verbal consent to proceed.  History of Present Illness Alexandra [name] is a 53 year old female who presents with new neurological symptoms following an ED visit. She is accompanied by her cousin, Alexandra [name].  She experiences facial numbness, particularly affecting her face and the inside of her mouth, which is sometimes swollen and red. She also has hand tremors, balance disturbances, and has had two episodes of near syncope, one of which occurred while she was in a wheelchair. These symptoms began approximately one month ago. An MRI of the brain was performed, but the results are not yet available. She has a neurology appointment scheduled for September 9th. Her prednisone  was paused but has since been restarted. Gabapentin  was initially prescribed at 100 mg, but it makes her sleepy and she questions its efficacy. She previously took a higher dose following a leg amputation, which she found more effective at that time.  She has persistent tinnitus described as 'ringing and bubbling' in her ears. An ENT evaluation indicated her ears appeared normal, but she was advised to use hearing aids. She also has a staph infection in her nose, for which she was prescribed a topical cream.  She experiences chronic itching, particularly on her arms and neck, and describes it as feeling like 'bugs are all  over me'.  She takes citalopram  40 mg for anxiety and depression, which helps maintain her mood stability. However, she has increased anxiety, particularly related to her mother's health and when in public spaces.  She is prescribed midodrine  5 mg for hypotension, which she takes once daily despite being advised to take it three times a day. She is concerned about her blood pressure increasing too much.  She takes atorvastatin  40 mg for cholesterol management but is reluctant to continue due to concerns about potential side effects. Her last cholesterol check showed an LDL of 118.  She takes vitamin D3 and iron  supplements, but she is unsure if they are necessary and is awaiting blood work to determine her needs.  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   aspirin  EC 81 MG EC tablet Take 1 tablet (81 mg total) by mouth daily. Swallow whole.   benzonatate  (TESSALON  PERLES) 100 MG capsule  Take 2 capsules (200 mg total) by mouth 3 (three) times daily as needed for cough.   Calcium  Carb-Cholecalciferol  (RA CALCIUM  600/VITAMIN D-3) 600-10 MG-MCG TABS RA CALCIUM  600/VITAMIN D-3 600-10 MG-MCG TABS   citalopram  (CELEXA ) 40 MG tablet Take 1 tablet (40 mg total) by mouth daily.   EPINEPHrine  0.3 mg/0.3 mL IJ SOAJ injection Inject 0.3 mg into the muscle as needed for anaphylaxis. Follow package instructions as needed for severe allergy or anaphylactic reaction.   ferrous gluconate  (FERGON) 324 MG tablet Take 1 tablet (324 mg total) by mouth  daily with breakfast.   folic acid (FOLVITE) 400 MCG tablet Take 400 mcg by mouth daily.   methotrexate (RHEUMATREX) 10 MG tablet Take 60 mg by mouth once a week. Caution: Chemotherapy. Protect from light.   midodrine  (PROAMATINE ) 5 MG tablet Take 1 tablet (5 mg total) by mouth daily.   nicotine  (NICODERM CQ  - DOSED IN MG/24 HOURS) 21 mg/24hr patch Place 1 patch (21 mg total) onto the skin daily.   pantoprazole  (PROTONIX ) 40 MG tablet Take 1 tablet (40 mg total) by mouth 2 (two) times daily.   Pirfenidone 267 MG CAPS Take 1 capsule by mouth in the morning, at noon, and at bedtime.   predniSONE  (DELTASONE ) 2.5 MG tablet Take 2.5 mg by mouth daily with breakfast.   albuterol  (VENTOLIN  HFA) 108 (90 Base) MCG/ACT inhaler Inhale 1-2 puffs into the lungs every 6 (six) hours as needed for wheezing or shortness of breath (cough). (Patient not taking: Reported on 11/04/2023)   atorvastatin  (LIPITOR) 40 MG tablet TAKE 1 TABLET(40 MG) BY MOUTH DAILY (Patient not taking: Reported on 11/04/2023)   fluticasone  (FLOVENT  HFA) 44 MCG/ACT inhaler Inhale 1 puff into the lungs daily. (Patient not taking: Reported on 11/04/2023)   linaclotide  (LINZESS ) 290 MCG CAPS capsule Take 1 capsule (290 mcg total) by mouth daily before breakfast. (Patient not taking: Reported on 11/04/2023)   [DISCONTINUED] gabapentin  (NEURONTIN ) 100 MG capsule Take 100 mg by mouth at bedtime. (Patient not taking: Reported on 11/04/2023)   [DISCONTINUED] ondansetron  (ZOFRAN -ODT) 4 MG disintegrating tablet Take 1 tablet (4 mg total) by mouth every 8 (eight) hours as needed for nausea or vomiting. (Patient not taking: Reported on 11/04/2023)   No facility-administered medications prior to visit.    Review of Systems  Last metabolic panel Lab Results  Component Value Date   GLUCOSE 93 10/12/2023   NA 140 10/12/2023   K 4.2 10/12/2023   CL 103 10/12/2023   CO2 25 10/12/2023   BUN 17 10/12/2023   CREATININE 0.75 10/12/2023   GFRNONAA >60  10/12/2023   CALCIUM  8.7 (L) 10/12/2023   PROT 7.1 07/23/2023   ALBUMIN 3.5 07/23/2023   LABGLOB 2.5 09/23/2022   BILITOT 0.4 07/23/2023   ALKPHOS 78 07/23/2023   AST 18 07/23/2023   ALT 21 07/23/2023   ANIONGAP 12 10/12/2023   Last lipids Lab Results  Component Value Date   CHOL 183 09/07/2020   HDL 36 (L) 09/07/2020   LDLCALC 118 (H) 09/07/2020   TRIG 143 09/07/2020   CHOLHDL 5.1 09/07/2020    Last hemoglobin A1c Lab Results  Component Value Date   HGBA1C 5.6 08/29/2020   Last thyroid functions Lab Results  Component Value Date   TSH 3.498 07/26/2020        Objective    BP (!) 91/56 (BP Location: Right Arm, Patient Position: Sitting, Cuff Size: Normal)   Pulse 65   Resp 16   Ht 5' 3 (1.6  m)   Wt 146 lb (66.2 kg)   LMP 01/14/2009   SpO2 100%   BMI 25.86 kg/m   BP Readings from Last 3 Encounters:  11/04/23 (!) 91/56  10/12/23 114/62  07/26/23 117/61   Wt Readings from Last 3 Encounters:  11/04/23 146 lb (66.2 kg)  10/12/23 145 lb (65.8 kg)  07/23/23 145 lb (65.8 kg)        Physical Exam Cardiovascular:     Rate and Rhythm: Normal rate and regular rhythm.  Pulmonary:     Effort: Pulmonary effort is normal. No respiratory distress.     Breath sounds: No wheezing.  Musculoskeletal:     Left lower leg: No edema.     Right Lower Extremity: Right leg is amputated below knee.  Neurological:     General: No focal deficit present.     Mental Status: She is alert.     Physical Exam VITALS: P- 65, BP- 91/56     No results found for any visits on 11/04/23.  Assessment & Plan     Problem List Items Addressed This Visit       Cardiovascular and Mediastinum   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HCC)   Relevant Orders   Lipid panel     Other   Status post below-knee amputation of right lower extremity (HCC)   Major depressive disorder with single episode, in partial remission (HCC)   Relevant Medications    busPIRone  (BUSPAR ) 10 MG tablet   hydrOXYzine  (VISTARIL ) 25 MG capsule   Other Relevant Orders   TSH+T4F+T3Free   Iron  deficiency anemia   Relevant Orders   CBC   Other Visit Diagnoses       Tinnitus of both ears    -  Primary     Facial swelling       Relevant Orders   TSH+T4F+T3Free     Tremor of both hands       Relevant Orders   Hemoglobin A1c   TSH+T4F+T3Free   CMP14+EGFR     Postural dizziness with presyncope         Pruritus       Relevant Medications   hydrOXYzine  (VISTARIL ) 25 MG capsule   gabapentin  (NEURONTIN ) 300 MG capsule     Nausea       Relevant Medications   ondansetron  (ZOFRAN -ODT) 4 MG disintegrating tablet     Disorder of both eustachian tubes       Relevant Medications   fluticasone  (FLONASE ) 50 MCG/ACT nasal spray     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 Differential diagnosis includes trigeminal neuralgia. Awaiting brain MRI results and neurology consultation. - Increase gabapentin  to 300 mg at bedtime. - Await brain MRI results. - Follow up with neurology on September 9th.  Hand tremor Hand  tremor present for the past month, causing difficulty holding objects.  Gait disturbance and dizziness Gait disturbance and dizziness, possibly related to hypotension. Blood pressure recorded at 91/56, indicating hypotension. Symptoms include feeling faint and dizzy, with episodes of near  syncope. - Increase midodrine  to 5 mg three times a day. - Monitor blood pressure regularly.  Hypotension Hypotension with blood pressure recorded at 91/56. Midodrine  prescribed but only taken once daily due to fear of hypertension. Symptoms include dizziness and near syncope. Discussed increasing midodrine  to manage symptoms and reassured about safety of dosage. - Increase midodrine  to 5 mg three times a day. - Monitor blood pressure regularly.  Tinnitus and ear bubbling Tinnitus and ear bubbling, possibly related to congestion or Eustachian tube dysfunction. ENT evaluation showed normal ear examination. Discussed potential impact of caffeine on symptoms and use of intranasal steroids for congestion. - Consider reducing caffeine intake. - Use intranasal steroids like Flonase  50mcg if congestion is suspected. - f/u with ENT   Chronic pruritus Chronic pruritus with sensation of bugs crawling on skin, leading to scratching and skin lesions. Gabapentin  may help with symptoms. Hydroxyzine  discussed for itching relief. - Prescribe hydroxyzine  25 mg at bedtime for itching. - Increase gabapentin  to 300 mg at bedtime.  Depression and anxiety Chronic  Increased anxiety and depression, exacerbated by recent family stressors. Current medication includes citalopram . Discussed potential use of hydroxyzine  for anxiety and itching, and buspirone  for acute anxiety. Hydroxyzine  may also aid in sleep. - Prescribe hydroxyzine  25 mg at bedtime for anxiety and itching. - Consider buspirone  for acute anxiety if needed.  Hyperlipidemia Chronic  Hyperlipidemia managed with atorvastatin  40 mg. Concerns about side effects, but no specific adverse effects reported. Last LDL was 118. Discussed importance of statins in preventing cardiovascular events and potential side effects. - Order lipid panel to assess current cholesterol levels. - Continue atorvastatin  40 mg as prescribed.  Follow-Up - Schedule follow-up  appointment in October to assess response to medication changes. - Ensure follow-up with neurology on September 9th.     Return in about 2 months (around 01/04/2024) for Anxiety.         Rockie Agent, MD  [facility] [phone] (phone) [phone] (fax)  [facility]

## 2023-11-05 LAB — CMP14+EGFR
ALT: 44 IU/L — ABNORMAL HIGH (ref 0–32)
AST: 49 IU/L — ABNORMAL HIGH (ref 0–40)
Albumin: 4.3 g/dL (ref 3.8–4.9)
Alkaline Phosphatase: 89 IU/L (ref 44–121)
BUN/Creatinine Ratio: 29 — ABNORMAL HIGH (ref 9–23)
BUN: 15 mg/dL (ref 6–24)
Bilirubin Total: 0.3 mg/dL (ref 0.0–1.2)
CO2: 23 mmol/L (ref 20–29)
Calcium: 8.8 mg/dL (ref 8.7–10.2)
Chloride: 100 mmol/L (ref 96–106)
Creatinine, Ser: 0.52 mg/dL — ABNORMAL LOW (ref 0.57–1.00)
Globulin, Total: 2 g/dL (ref 1.5–4.5)
Glucose: 74 mg/dL (ref 70–99)
Potassium: 4.4 mmol/L (ref 3.5–5.2)
Sodium: 137 mmol/L (ref 134–144)
Total Protein: 6.3 g/dL (ref 6.0–8.5)
eGFR: 110 mL/min/1.73 (ref >59)

## 2023-11-05 LAB — VITAMIN D 25 HYDROXY (VIT D DEFICIENCY, FRACTURES): Vit D, 25-Hydroxy: 25.5 ng/mL — ABNORMAL LOW (ref 30.0–100.0)

## 2023-11-05 LAB — LIPID PANEL
Chol/HDL Ratio: 3.7 ratio (ref 0.0–4.4)
Cholesterol, Total: 157 mg/dL (ref 100–199)
HDL: 43 mg/dL (ref >39)
LDL Chol Calc (NIH): 95 mg/dL (ref 0–99)
Triglycerides: 106 mg/dL (ref 0–149)
VLDL Cholesterol Cal: 19 mg/dL (ref 5–40)

## 2023-11-05 LAB — CBC
Hematocrit: 40.3 % (ref 34.0–46.6)
Hemoglobin: 13.1 g/dL (ref 11.1–15.9)
MCH: 29.2 pg (ref 26.6–33.0)
MCHC: 32.5 g/dL (ref 31.5–35.7)
MCV: 90 fL (ref 79–97)
Platelets: 377 x10E3/uL (ref 150–450)
RBC: 4.48 x10E6/uL (ref 3.77–5.28)
RDW: 15.3 % (ref 11.7–15.4)
WBC: 6.4 x10E3/uL (ref 3.4–10.8)

## 2023-11-05 LAB — HEMOGLOBIN A1C
Est. average glucose Bld gHb Est-mCnc: 103 mg/dL
Hgb A1c MFr Bld: 5.2 % (ref 4.8–5.6)

## 2023-11-05 LAB — TSH+T4F+T3FREE
Free T4: 0.74 ng/dL — ABNORMAL LOW (ref 0.82–1.77)
T3, Free: 2.4 pg/mL (ref 2.0–4.4)
TSH: 0.915 u[IU]/mL (ref 0.450–4.500)

## 2023-11-07 ENCOUNTER — Ambulatory Visit: Payer: Self-pay | Admitting: Family Medicine

## 2023-11-11 ENCOUNTER — Other Ambulatory Visit: Payer: Self-pay | Admitting: Physician Assistant

## 2023-11-11 DIAGNOSIS — F339 Major depressive disorder, recurrent, unspecified: Secondary | ICD-10-CM

## 2023-11-17 ENCOUNTER — Telehealth: Payer: Self-pay

## 2023-11-17 ENCOUNTER — Other Ambulatory Visit: Payer: Self-pay | Admitting: Family Medicine

## 2023-11-17 DIAGNOSIS — F339 Major depressive disorder, recurrent, unspecified: Secondary | ICD-10-CM

## 2023-11-17 MED ORDER — CITALOPRAM HYDROBROMIDE 40 MG PO TABS: 40.0000 mg | ORAL_TABLET | Freq: Every day | ORAL | 3 refills | Status: AC

## 2023-11-17 NOTE — Telephone Encounter (Signed)
 Copied from CRM [id number]. Topic: Clinical - Medication Refill >> Nov 17, 2023 11:40 AM Amy B wrote: Medication: citalopram  (CELEXA ) 40 MG tablet  Has the patient contacted their pharmacy? Yes (Agent: If no, request that the patient contact the pharmacy for the refill. If patient does not wish to contact the pharmacy document the reason why and proceed with request.) (Agent: If yes, when and what did the pharmacy advise?)  This is the patient's preferred pharmacy:  [id number] [place], [place] - [address] AT [facility] OF [address] [place] [address] [place] [address] Phone: [phone] Fax: [phone]  Is this the correct pharmacy for this prescription? Yes If no, delete pharmacy and type the correct one.   Has the prescription been filled recently? No  Is the patient out of the medication? Yes  Has the patient been seen for an appointment in the last year OR does the patient have an upcoming appointment? Yes  Can we respond through MyChart? Yes  Agent: Please be advised that Rx refills may take up to 3 business days. We ask that you follow-up with your pharmacy.

## 2023-11-17 NOTE — Telephone Encounter (Signed)
 Copied from CRM [id number]. Topic: Clinical - Prescription Issue >> Nov 17, 2023 11:43 AM Amy B wrote: Reason for CRM: Patient states refill for citalopram  (CELEXA ) 40 MG tablet was denied.  She requests a call back to discuss why.

## 2023-11-17 NOTE — Telephone Encounter (Signed)
 Rx was denied because it was sent to the wrong practice.  I have since refilled it

## 2023-11-22 DIAGNOSIS — R4189 Other symptoms and signs involving cognitive functions and awareness: Secondary | ICD-10-CM | POA: Diagnosis not present

## 2023-11-22 DIAGNOSIS — R519 Headache, unspecified: Secondary | ICD-10-CM | POA: Diagnosis not present

## 2023-11-22 DIAGNOSIS — R2689 Other abnormalities of gait and mobility: Secondary | ICD-10-CM | POA: Diagnosis not present

## 2023-11-22 DIAGNOSIS — R251 Tremor, unspecified: Secondary | ICD-10-CM | POA: Diagnosis not present

## 2023-11-22 DIAGNOSIS — R200 Anesthesia of skin: Secondary | ICD-10-CM | POA: Diagnosis not present

## 2023-12-01 ENCOUNTER — Other Ambulatory Visit: Payer: Self-pay | Admitting: Family Medicine

## 2023-12-01 DIAGNOSIS — L299 Pruritus, unspecified: Secondary | ICD-10-CM

## 2023-12-05 DIAGNOSIS — J019 Acute sinusitis, unspecified: Secondary | ICD-10-CM | POA: Diagnosis not present

## 2023-12-05 DIAGNOSIS — J22 Unspecified acute lower respiratory infection: Secondary | ICD-10-CM | POA: Diagnosis not present

## 2023-12-05 DIAGNOSIS — R918 Other nonspecific abnormal finding of lung field: Secondary | ICD-10-CM | POA: Diagnosis not present

## 2023-12-05 DIAGNOSIS — B9689 Other specified bacterial agents as the cause of diseases classified elsewhere: Secondary | ICD-10-CM | POA: Diagnosis not present

## 2023-12-16 ENCOUNTER — Other Ambulatory Visit: Payer: Self-pay | Admitting: Family Medicine

## 2023-12-22 NOTE — Progress Notes (Unsigned)
 Cardiology Office Note  Date:  12/22/2023   ID:  NAYAB ATEN, DOB Jun 27, 1970, MRN [id number]  PCP:  Sharma Coyer, MD   No chief complaint on file.   HPI:  Ms. 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, polyangitis, granulomatosis treated with Prednisone  2.5 mg/day and methotrexate 15 mg weekly  Who presents for follow-up of her lightheadedness  LOV with myself 6/24 Seen by one of our providers August 2024  Echocardiogram June 2024 Normal left and right ventricular size and function  No significant valvular heart disease  No indication of pulmonary hypertension   Evaluated by pulmonary for shortness of breath on exertion  CT chest  with apico-basal gradient fibrotic changes honeycombing at the bases bilaterally with known ANCA positive vasculitis granulomatosis with polyangiitis   COVID-31 August 2021.   on methotrexate 2.5 mg/day with 15 mg weekly regimen.  been on rituximab  and high-dose steroids in August 2022.  on 2.5 mg of prednisone  daily.  C-reactive protein was performed in August 2023 with essentially normal   05/03/22- non-massive hemoptysis   brother had pulmonary fibrosis and died 4 years after being diagnosed with it. Patients grandfather passed away at late 42s.   Hx of  aortic thrombus , arterial lower extermity thrombosis. Patient smokes daily   Echo performed May 05, 2021 with no pulmonary valve regurgitation, absence of right-sided dysfunction and only trace tricuspid valve insufficiency.   Evaluated by neurology Aug 13, 2022 On discussion of lightheadedness,  imbalance, carotid ultrasound ordered, PT and vestibular rehab ordered  Lab work reviewed A1c 6.2  Periodic low blood pressure noted on today's visit, also with vascular on recent office visits 98/64  1 -2 months of lightheadedness Could be iny position, could pass out Can't hold head up, Sits rest of the day to recover, feels like bobble head, could black out  EKG personally reviewed by myself on todays visit Normal sinus rhythm rate 80 bpm no significant ST-T wave changes  PMH:   has a past medical history of Anxiety, Breast discharge (2013), Clotting disorder, Depression, Pulmonary fibrosis (HCC), Rheumatoid arthritis (HCC), and 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  PSH:    Past Surgical History:  Procedure Laterality Date   ABDOMINAL HYSTERECTOMY     AMPUTATION Right 09/12/2020   Procedure: AMPUTATION BELOW KNEE;  Surgeon: Marea Selinda RAMAN, MD;  Location: [facility] ORS;  Service: Vascular;  Laterality: Right;   BIOPSY  11/10/2022   Procedure: BIOPSY;  Surgeon: Therisa Bi, MD;  Location: [facility] ENDOSCOPY;  Service: Gastroenterology;;   BREAST CYST EXCISION Left 2006   COLONOSCOPY WITH PROPOFOL  N/A 11/10/2022   Procedure: COLONOSCOPY WITH PROPOFOL ;  Surgeon: Therisa Bi, MD;  Location: [facility] ENDOSCOPY;  Service: Gastroenterology;  Laterality: N/A;   ENDOVASCULAR STENT INSERTION N/A 09/08/2020   Procedure: ENDOVASCULAR STENT GRAFT INSERTION;  Surgeon: Marea Selinda RAMAN, MD;  Location: [facility] ORS;  Service: Vascular;  Laterality: N/A;   ESOPHAGOGASTRODUODENOSCOPY (EGD) WITH PROPOFOL  N/A 11/10/2022   Procedure: ESOPHAGOGASTRODUODENOSCOPY (EGD) WITH PROPOFOL ;  Surgeon: Therisa Bi, MD;  Location: [facility] ENDOSCOPY;  Service: Gastroenterology;  Laterality: N/A;   LOWER EXTREMITY ANGIOGRAPHY Right 09/01/2020   Procedure: Lower Extremity Angiography;  Surgeon: Marea Selinda RAMAN, MD;  Location: [facility] INVASIVE CV  LAB;  Service: Cardiovascular;  Laterality: Right;   LOWER EXTREMITY ANGIOGRAPHY  Left 10/01/2020   Procedure: LOWER EXTREMITY ANGIOGRAPHY;  Surgeon: Marea Selinda RAMAN, MD;  Location: [facility] INVASIVE CV LAB;  Service: Cardiovascular;  Laterality: Left;   LOWER EXTREMITY INTERVENTION Right 09/07/2020   Procedure: LOWER EXTREMITY INTERVENTION;  Surgeon: Serene Gaile ORN, MD;  Location: [facility] INVASIVE CV LAB;  Service: Cardiovascular;  Laterality: Right;    Current Outpatient Medications  Medication Sig Dispense Refill   albuterol  (VENTOLIN  HFA) 108 (90 Base) MCG/ACT inhaler Inhale 1-2 puffs into the lungs every 6 (six) hours as needed for wheezing or shortness of breath (cough). (Patient not taking: Reported on 11/04/2023) 8 g 0   apixaban  (ELIQUIS ) 5 MG TABS tablet Take 1 tablet (5 mg total) by mouth 2 (two) times daily. 60 tablet 4   aspirin  EC 81 MG EC tablet Take 1 tablet (81 mg total) by mouth daily. Swallow whole. 90 tablet 3   atorvastatin  (LIPITOR) 40 MG tablet TAKE 1 TABLET(40 MG) BY MOUTH DAILY 90 tablet 3   benzonatate  (TESSALON  PERLES) 100 MG capsule Take 2 capsules (200 mg total) by mouth 3 (three) times daily as needed for cough. 20 capsule 0   busPIRone  (BUSPAR ) 10 MG tablet Take 1 tablet (10 mg total) by mouth 2 (two) times daily. 60 tablet 2   Calcium  Carb-Cholecalciferol  (RA CALCIUM  600/VITAMIN D -3) 600-10 MG-MCG TABS RA CALCIUM  600/VITAMIN D -3 600-10 MG-MCG TABS     citalopram  (CELEXA ) 40 MG tablet Take 1 tablet (40 mg total) by mouth daily. 90 tablet 3   EPINEPHrine  0.3 mg/0.3 mL IJ SOAJ injection Inject 0.3 mg into the muscle as needed for anaphylaxis. Follow package instructions as needed for severe allergy or anaphylactic reaction. 2 each 3   ferrous gluconate  (FERGON) 324 MG tablet Take 1 tablet (324 mg total) by mouth daily with breakfast.     fluticasone  (FLONASE ) 50 MCG/ACT nasal spray Place 2 sprays into both nostrils daily. 16 g 6   fluticasone  (FLOVENT  HFA) 44 MCG/ACT inhaler Inhale 1 puff into the lungs daily. (Patient not taking: Reported on 11/04/2023) 1  each 0   folic acid (FOLVITE) 400 MCG tablet Take 400 mcg by mouth daily.     gabapentin  (NEURONTIN ) 300 MG capsule Take 1 capsule (300 mg total) by mouth at bedtime. 60 capsule 3   hydrOXYzine  (VISTARIL ) 25 MG capsule TAKE 1 CAPSULE(25 MG) BY MOUTH AT BEDTIME AS NEEDED 30 capsule 2   linaclotide  (LINZESS ) 290 MCG CAPS capsule Take 1 capsule (290 mcg total) by mouth daily before breakfast. (Patient not taking: Reported on 11/04/2023) 90 capsule 3   methotrexate (RHEUMATREX) 10 MG tablet Take 60 mg by mouth once a week. Caution: Chemotherapy. Protect from light.     midodrine  (PROAMATINE ) 5 MG tablet Take 1 tablet (5 mg total) by mouth daily. 90 tablet 1   nicotine  (NICODERM CQ  - DOSED IN MG/24 HOURS) 21 mg/24hr patch Place 1 patch (21 mg total) onto the skin daily. 90 patch 4   ondansetron  (ZOFRAN -ODT) 4 MG disintegrating tablet Take 1 tablet (4 mg total) by mouth every 8 (eight) hours as needed for nausea or vomiting.     pantoprazole  (PROTONIX ) 40 MG tablet Take 1 tablet (40 mg total) by mouth 2 (two) times daily. 60 tablet 2   Pirfenidone 267 MG CAPS Take 1 capsule by mouth in the morning, at noon, and at bedtime.     predniSONE  (DELTASONE ) 2.5 MG tablet Take 2.5 mg by mouth  daily with breakfast.     No current facility-administered medications for this visit.    Allergies:   Patient has no known allergies.   Social History:  The patient  reports that she has been smoking cigarettes. She has a 29 pack-year smoking history. She has never used smokeless tobacco. She reports that she does not currently use alcohol. She reports that she does not use drugs.   Family History:   family history includes Breast cancer in her maternal aunt, maternal aunt, and maternal grandmother; Breast cancer (age of onset: 2) in her cousin; Breast cancer (age of onset: 6) in her mother; Congestive Heart Failure in her father; Diabetes in her brother, father, maternal grandfather, and paternal aunt.    Review of  Systems: Review of Systems  Constitutional: Negative.   HENT: Negative.    Respiratory: Negative.    Cardiovascular: Negative.   Gastrointestinal: Negative.   Musculoskeletal: Negative.   Neurological:  Positive for dizziness.  Psychiatric/Behavioral: Negative.    All other systems reviewed and are negative.   PHYSICAL EXAM: VS:  LMP 01/14/2009  , BMI There is no height or weight on file to calculate BMI. GEN: Well nourished, well developed, in no acute distress HEENT: normal Neck: no JVD, carotid bruits, or masses Cardiac: RRR; no murmurs, rubs, or gallops,no edema  Respiratory:  clear to auscultation bilaterally, normal work of breathing GI: soft, nontender, nondistended, + BS MS: no deformity or atrophy Skin: warm and dry, no rash Neuro:  Strength and sensation are intact Psych: euthymic mood, full affect   Recent Labs: 11/04/2023: ALT 44; BUN 15; Creatinine, Ser 0.52; Hemoglobin 13.1; Platelets 377; Potassium 4.4; Sodium 137; TSH 0.915    Lipid Panel Lab Results  Component Value Date   CHOL 157 11/04/2023   HDL 43 11/04/2023   LDLCALC 95 11/04/2023   TRIG 106 11/04/2023      Wt Readings from Last 3 Encounters:  11/04/23 146 lb (66.2 kg)  10/12/23 145 lb (65.8 kg)  07/23/23 145 lb (65.8 kg)      ASSESSMENT AND PLAN:  Problem List Items Addressed This Visit   None   Lightheadedness Does not seem to be positional, can happen laying down, sitting standing Seems to come out of the blue, some associated near syncope, feels like she has  bobble head. Blood pressure running low Unclear if related to low blood pressure, she will monitor blood pressure numbers at home when she has symptoms Recommend she stay hydrated, liberalize her salt intake She denies any new medications that would have contributed to symptoms, no recent weight loss in fact has had weight gain she reports -Discussed first medications that could be used to raise blood pressure in effort to  relieve symptoms, Florinef which she prefers not to take and midodrine  which she could take as needed 5 up to 10 mg -She will call us  with blood pressure measurements with symptoms and if correlating with low blood pressure, could try midodrine  5 as needed -Certainly possible symptoms are related to vasculitis She is scheduled to see rheumatology next week -Carotid ultrasound ordered  Aortic atherosclerosis Mild aortic atherosclerosis on CT scan on my review Prior stenting for aortic thrombus On Eliquis  5 twice daily, recommended smoking cessation  Wegeners/pulmonary fibrosis Followed by pulmonary They have requested repeat echo to rule out pulmonary hypertension, echo ordered Smoking cessation recommended     Total encounter time more than 60 minutes  Greater than 50% was spent in counseling and coordination of  care with the patient    Signed, Velinda Lunger, M.D., Ph.D. [facility] [place], [place] [phone]

## 2023-12-23 ENCOUNTER — Encounter: Payer: Self-pay | Admitting: Cardiovascular Disease

## 2023-12-23 ENCOUNTER — Ambulatory Visit: Attending: Cardiovascular Disease | Admitting: Cardiovascular Disease

## 2023-12-23 VITALS — BP 99/60 | HR 86 | Ht 62.0 in | Wt 152.0 lb

## 2023-12-23 DIAGNOSIS — I739 Peripheral vascular disease, unspecified: Secondary | ICD-10-CM

## 2023-12-23 DIAGNOSIS — R42 Dizziness and giddiness: Secondary | ICD-10-CM | POA: Diagnosis not present

## 2023-12-23 DIAGNOSIS — I700 Atherosclerosis of aorta: Secondary | ICD-10-CM | POA: Diagnosis not present

## 2023-12-23 DIAGNOSIS — E785 Hyperlipidemia, unspecified: Secondary | ICD-10-CM | POA: Diagnosis not present

## 2023-12-23 DIAGNOSIS — I7410 Embolism and thrombosis of unspecified parts of aorta: Secondary | ICD-10-CM

## 2023-12-23 DIAGNOSIS — I776 Arteritis, unspecified: Secondary | ICD-10-CM

## 2023-12-23 DIAGNOSIS — R55 Syncope and collapse: Secondary | ICD-10-CM | POA: Diagnosis not present

## 2023-12-23 DIAGNOSIS — K2960 Other gastritis without bleeding: Secondary | ICD-10-CM | POA: Diagnosis not present

## 2023-12-23 DIAGNOSIS — Z720 Tobacco use: Secondary | ICD-10-CM

## 2023-12-23 DIAGNOSIS — J8410 Pulmonary fibrosis, unspecified: Secondary | ICD-10-CM | POA: Diagnosis not present

## 2023-12-23 DIAGNOSIS — I70229 Atherosclerosis of native arteries of extremities with rest pain, unspecified extremity: Secondary | ICD-10-CM | POA: Diagnosis not present

## 2023-12-23 MED ORDER — MIDODRINE HCL 5 MG PO TABS: 5.0000 mg | ORAL_TABLET | Freq: Three times a day (TID) | ORAL | 6 refills | Status: DC

## 2023-12-23 NOTE — Patient Instructions (Signed)

## 2024-01-03 ENCOUNTER — Ambulatory Visit: Payer: Self-pay

## 2024-01-03 ENCOUNTER — Encounter: Payer: Self-pay | Admitting: Oncology

## 2024-01-03 ENCOUNTER — Telehealth: Payer: Self-pay

## 2024-01-03 DIAGNOSIS — R7989 Other specified abnormal findings of blood chemistry: Secondary | ICD-10-CM

## 2024-01-03 NOTE — Telephone Encounter (Unsigned)
 Copied from CRM #[id number]. Topic: Clinical - Request for Lab/Test Order >> Jan 03, 2024 12:59 PM Lauren C wrote: Reason for CRM: Per result note, Normal TSH and T3 levels for thyroid, free T4 is low at 0.74. would recommend rechecking in 3 months. Pt is wanting to come in for this recheck next month, but no orders seen in chart.

## 2024-01-03 NOTE — Telephone Encounter (Signed)
 FYI Only or Action Required?: FYI only for provider.  Patient was last seen in primary care on 11/04/2023 by Sharma Coyer, MD.  Called Nurse Triage reporting Knee Pain.  Symptoms began several weeks ago.  Interventions attempted: Nothing.  Symptoms are: stable.  Triage Disposition: See PCP When Office is Open (Within 3 Days)  Patient/caregiver understands and will follow disposition?: Yes Reason for Disposition  [1] MODERATE pain (e.g., interferes with normal activities, limping) AND [2] present > 3 days  Answer Assessment - Initial Assessment Questions Patient is looking for referral to ortho.   1. LOCATION and RADIATION: Where is the pain located?      Right knee, previously amputated  2. QUALITY: What does the pain feel like?  (e.g., sharp, dull, aching, burning)     Has to pop knee back in place after sitting   3. SEVERITY: How bad is the pain? What does it keep you from doing?   (Scale 1-10; or mild, moderate, severe)     Painful at night when sleeping 10/10  4. ONSET: When did the pain start? Does it come and go, or is it there all the time?     About 2 weeks  8. ASSOCIATED SYMPTOMS: Is there any swelling or redness of the knee?     Noticed swelling  9. OTHER SYMPTOMS: Do you have any other symptoms? (e.g., calf pain, chest pain, difficulty breathing, fever)     Denies  Protocols used: Knee Pain-A-AH  Copied from CRM #[id number]. Topic: Clinical - Red Word Triage >> Jan 03, 2024  1:06 PM Tinnie BROCKS wrote: Red Word that prompted transfer to Nurse Triage: Desires ortho referral, referral request submitted but she will likely need appt. Rt leg was amputated below the knee and she fell and broke femur. Ever since then, she has felt something not right with knee. She has to pop it back into place to get up, leg pain, difficult to walk with the pain.

## 2024-01-03 NOTE — Telephone Encounter (Unsigned)
 Copied from CRM [id number]. Topic: Referral - Request for Referral >> Jan 03, 2024  1:05 PM Lauren C wrote: Did the patient discuss referral with their provider in the last year? No (If No - schedule appointment) (If Yes - send message)  Appointment offered? Yes, transferred to NT due to leg pain.  Type of order/referral and detailed reason for visit: Desires ortho referral. Rt leg was amputated below the knee and she fell and broke femur. Ever since then, she has felt something not right with knee. She has to pop it back into place to get up, leg pain  Preference of office, provider, location: Most of her Drs are at [facility], if they have ortho.  If referral order, have you been seen by this specialty before? No (If Yes, this issue or another issue? When? Where?  Can we respond through MyChart? Yes

## 2024-01-04 ENCOUNTER — Encounter: Payer: Self-pay | Admitting: Physician Assistant

## 2024-01-04 ENCOUNTER — Other Ambulatory Visit: Payer: Self-pay

## 2024-01-04 ENCOUNTER — Ambulatory Visit: Admitting: Physician Assistant

## 2024-01-04 VITALS — BP 104/64 | HR 86 | Resp 14 | Ht 62.0 in | Wt 148.8 lb

## 2024-01-04 DIAGNOSIS — Z89511 Acquired absence of right leg below knee: Secondary | ICD-10-CM | POA: Diagnosis not present

## 2024-01-04 DIAGNOSIS — M25561 Pain in right knee: Secondary | ICD-10-CM | POA: Diagnosis not present

## 2024-01-04 DIAGNOSIS — G8929 Other chronic pain: Secondary | ICD-10-CM | POA: Diagnosis not present

## 2024-01-04 NOTE — Telephone Encounter (Signed)
 Pt was seen today with Janna. During OV pt discussed a few changes. PT is needing Gabapentin  sent in as 100mg  not 300mg  she will not take the 300mg  and does not want that dose. Pt is wanting a medication for her anxiety that will not sedate her as she read online the ones she is on are to sedate. She has stopped Buspirone  and Hydroxyzine  medication and is continuing with Citalopram  but that one only helps with her crying not her anxiety.  I advised pt someone will contact her once provider has reviewed and finalized recommendation.

## 2024-01-04 NOTE — Telephone Encounter (Signed)
 Lab order placed.  Reports provider she saw in office sent her over for imaging and the office was closed

## 2024-01-04 NOTE — Progress Notes (Signed)
 Established patient visit  Patient: Alexandra [name]   DOB: 23-Dec-1970   53 y.o. Female  MRN: [id number] Visit Date: 01/04/2024  Today's healthcare provider: Jolynn Spencer, PA-C   Chief Complaint  Patient presents with   Knee Pain    Desires ortho referral. Rt leg was amputated below the knee and she fell and broke femur in September 2024. Ever since then, she has felt something not right with knee. She has to pop it back into place to get up, leg pain to be able to get up.    Subjective      Discussed the use of AI scribe software for clinical note transcription with the patient, who gave verbal consent to proceed.  History of Present Illness Alexandra [name] is a 53 year old female with rheumatoid arthritis who presents with worsening knee pain.  She has persistent knee pain in her amputated leg since a fall last September that resulted in a femur fracture. The pain is constant, worsening, and causes significant discomfort when sitting for long periods, requiring manual adjustment of her knee. Two weeks ago, bowing of her leg increased the pain and irritation at the previous fracture site. The pain can be severe enough to cause nausea. Over-the-counter medications like Advil  are ineffective. She experiences tightness and occasional swelling in the knee area.  She has been using a prosthetic leg for three years without prior issues until the recent exacerbation of knee pain.  Her medical history includes rheumatoid arthritis, Wegener's disease, and pulmonary fibrosis. She has consulted a vascular surgeon and a rheumatologist, who found no issues from their perspectives. She has had a hysterectomy and is not pregnant. She recently recovered from COVID-19, which significantly impacted her health.       11/04/2023    2:30 PM 06/30/2023    3:46 PM 05/09/2023    1:34 PM  Depression screen PHQ 2/9  Decreased Interest 1 0 1  Down, Depressed, Hopeless 0 0 1  PHQ - 2 Score 1 0 2  Altered  sleeping 1  3  Tired, decreased energy 3  3  Change in appetite 1  0  Feeling bad or failure about yourself  0  0  Trouble concentrating 0  0  Moving slowly or fidgety/restless 1  0  Suicidal thoughts 0  0  PHQ-9 Score 7  8  Difficult doing work/chores Not difficult at all  Somewhat difficult      11/04/2023    2:31 PM 05/09/2023    1:35 PM 11/24/2022   10:44 AM  GAD 7 : Generalized Anxiety Score  Nervous, Anxious, on Edge 3 1 0  Control/stop worrying 3 1 3   Worry too much - different things 3 1 3   Trouble relaxing 3 0 0  Restless 3 0 0  Easily annoyed or irritable 1 1 0  Afraid - awful might happen 1 0 0  Total GAD 7 Score 17 4 6   Anxiety Difficulty Very difficult Somewhat difficult Somewhat difficult    Medications: Outpatient Medications Prior to Visit  Medication Sig   albuterol  (VENTOLIN  HFA) 108 (90 Base) MCG/ACT inhaler Inhale 1-2 puffs into the lungs every 6 (six) hours as needed for wheezing or shortness of breath (cough).   apixaban  (ELIQUIS ) 5 MG TABS tablet Take 1 tablet (5 mg total) by mouth 2 (two) times daily.   aspirin  EC 81 MG EC tablet Take 1 tablet (81 mg total) by mouth daily. Swallow whole.   atorvastatin  (LIPITOR) 40 MG  tablet TAKE 1 TABLET(40 MG) BY MOUTH DAILY   citalopram  (CELEXA ) 40 MG tablet Take 1 tablet (40 mg total) by mouth daily.   ferrous gluconate  (FERGON) 324 MG tablet Take 1 tablet (324 mg total) by mouth daily with breakfast.   fluticasone  (FLONASE ) 50 MCG/ACT nasal spray Place 2 sprays into both nostrils daily.   fluticasone  (FLOVENT  HFA) 44 MCG/ACT inhaler Inhale 1 puff into the lungs daily.   folic acid (FOLVITE) 400 MCG tablet Take 400 mcg by mouth daily.   linaclotide  (LINZESS ) 290 MCG CAPS capsule Take 1 capsule (290 mcg total) by mouth daily before breakfast. (Patient taking differently: Take 290 mcg by mouth as needed.)   methotrexate (RHEUMATREX) 10 MG tablet Take 60 mg by mouth once a week. Caution: Chemotherapy. Protect from light.    midodrine  (PROAMATINE ) 5 MG tablet Take 1 tablet (5 mg total) by mouth 3 (three) times daily with meals.   Pirfenidone 267 MG CAPS Take 1 capsule by mouth in the morning, at noon, and at bedtime.   predniSONE  (DELTASONE ) 2.5 MG tablet Take 2.5 mg by mouth daily with breakfast.   [DISCONTINUED] benzonatate  (TESSALON  PERLES) 100 MG capsule Take 2 capsules (200 mg total) by mouth 3 (three) times daily as needed for cough.   [DISCONTINUED] busPIRone  (BUSPAR ) 10 MG tablet Take 1 tablet (10 mg total) by mouth 2 (two) times daily.   [DISCONTINUED] Calcium  Carb-Cholecalciferol  (RA CALCIUM  600/VITAMIN D -3) 600-10 MG-MCG TABS RA CALCIUM  600/VITAMIN D -3 600-10 MG-MCG TABS   [DISCONTINUED] EPINEPHrine  0.3 mg/0.3 mL IJ SOAJ injection Inject 0.3 mg into the muscle as needed for anaphylaxis. Follow package instructions as needed for severe allergy or anaphylactic reaction.   [DISCONTINUED] hydrOXYzine  (VISTARIL ) 25 MG capsule TAKE 1 CAPSULE(25 MG) BY MOUTH AT BEDTIME AS NEEDED   [DISCONTINUED] ondansetron  (ZOFRAN -ODT) 4 MG disintegrating tablet Take 1 tablet (4 mg total) by mouth every 8 (eight) hours as needed for nausea or vomiting.   [DISCONTINUED] pantoprazole  (PROTONIX ) 40 MG tablet Take 1 tablet (40 mg total) by mouth 2 (two) times daily.   [DISCONTINUED] gabapentin  (NEURONTIN ) 300 MG capsule Take 1 capsule (300 mg total) by mouth at bedtime. (Patient not taking: Reported on 12/23/2023)   [DISCONTINUED] nicotine  (NICODERM CQ  - DOSED IN MG/24 HOURS) 21 mg/24hr patch Place 1 patch (21 mg total) onto the skin daily. (Patient not taking: Reported on 12/23/2023)   No facility-administered medications prior to visit.    Review of Systems All negative Except see HPI       Objective    BP 104/64   Pulse 86   Resp 14   Ht 5' 2 (1.575 m)   Wt 148 lb 12.8 oz (67.5 kg)   LMP 01/14/2009   SpO2 97%   BMI 27.22 kg/m     Physical Exam Constitutional:      General: She is not in acute distress.     Appearance: Normal appearance.  HENT:     Head: Normocephalic.  Pulmonary:     Effort: Pulmonary effort is normal. No respiratory distress.  Musculoskeletal:        General: Tenderness present. No swelling.  Neurological:     Mental Status: She is alert and oriented to person, place, and time. Mental status is at baseline.      No results found for any visits on 01/04/24.      Assessment & Plan Chronic right knee pain after femur fracture and above-knee amputation Chronic right knee pain persists post-femur fracture and above-knee amputation,  exacerbated by prolonged sitting. Recent increase in pain and leg bowing. Previous vascular and rheumatology evaluations unremarkable. Orthopedic evaluation needed. - Order X-ray of right knee to assess structural abnormalities. - Refer to orthopedic specialist at [facility] for further evaluation and management. - Perform CBC to check for inflammatory or infectious markers. - Provide bandage for skin protection. Will follow-up with pcp  Orders Placed This Encounter  Procedures   DG Knee Complete 4 Views Right    Reason for Exam (SYMPTOM  OR DIAGNOSIS REQUIRED):   knee pain x 1 year and 1 mo    Is patient pregnant?:   No             hysterrectomy    Preferred imaging location?:   [facility]   CBC with Differential/Platelet   Sed Rate (ESR)   C-reactive protein   Ambulatory referral to Orthopedics    Referral Priority:   Urgent    Referral Type:   Consultation    Number of Visits Requested:   1    No follow-ups on file.   The patient was advised to call back or seek an in-person evaluation if the symptoms worsen or if the condition fails to improve as anticipated.  I discussed the assessment and treatment plan with the patient. The patient was provided an opportunity to ask questions and all were answered. The patient agreed with the plan and demonstrated an understanding of the instructions.  I, Euclid Cassetta, PA-C  have reviewed all documentation for this visit. The documentation on 01/04/2024  for the exam, diagnosis, procedures, and orders are all accurate and complete.  Jolynn Spencer, [facility], MMS [facility] [phone] (phone) [phone] (fax)  [facility]

## 2024-01-06 ENCOUNTER — Other Ambulatory Visit: Payer: Self-pay | Admitting: Family Medicine

## 2024-01-06 MED ORDER — GABAPENTIN 100 MG PO CAPS: 100.0000 mg | ORAL_CAPSULE | Freq: Two times a day (BID) | ORAL | 3 refills | Status: DC | PRN

## 2024-01-06 MED ORDER — BUSPIRONE HCL 7.5 MG PO TABS: 7.5000 mg | ORAL_TABLET | Freq: Two times a day (BID) | ORAL | 2 refills | Status: DC

## 2024-01-06 MED ORDER — GABAPENTIN 100 MG PO CAPS: 100.0000 mg | ORAL_CAPSULE | Freq: Every evening | ORAL | 0 refills | Status: DC

## 2024-01-12 ENCOUNTER — Other Ambulatory Visit: Payer: Self-pay | Admitting: Family Medicine

## 2024-01-12 NOTE — Telephone Encounter (Signed)
 Copied from CRM #[id number]. Topic: Clinical - Medication Refill >> Jan 12, 2024 12:47 PM Darshell M wrote: Medication: Pantoprazole  40 mg tablets  Patient received prescription from another provider for heart burn and gerd. The provider is no longer at the practice and she is out of medication.  Has the patient contacted their pharmacy? Yes (Agent: If no, request that the patient contact the pharmacy for the refill. If patient does not wish to contact the pharmacy document the reason why and proceed with request.) (Agent: If yes, when and what did the pharmacy advise?)  This is the patient's preferred pharmacy:  [id number] [place], [place] - [address] AT [facility] [address] [place] [address] [place] [address] Phone: [phone] Fax: [phone]  Is this the correct pharmacy for this prescription? Yes If no, delete pharmacy and type the correct one.   Has the prescription been filled recently? No  Is the patient out of the medication? Yes  Has the patient been seen for an appointment in the last year OR does the patient have an upcoming appointment? Yes  Can we respond through MyChart? Yes  Agent: Please be advised that Rx refills may take up to 3 business days. We ask that you follow-up with your pharmacy.

## 2024-01-13 NOTE — Telephone Encounter (Signed)
 Requested medications are due for refill today.  unsure  Requested medications are on the active medications list.  yes  Last refill. 01/12/2024  Future visit scheduled.   no  Notes to clinic.  Medication is historical.    Requested Prescriptions  Pending Prescriptions Disp Refills   pantoprazole  (PROTONIX ) 40 MG tablet      Sig: Take 1 tablet (40 mg total) by mouth daily.     Gastroenterology: Proton Pump Inhibitors Passed - 01/13/2024  4:31 PM      Passed - Valid encounter within last 12 months    Recent Outpatient Visits           1 week ago Chronic pain of right knee   [place] [facility] [place], [place], PA-C   2 months ago Tinnitus of both ears   [place] [facility] [place], [place], MD   8 months ago Swelling of joint of right knee   [place] [facility] [place], [place], [place]              Signed Prescriptions Disp Refills   pantoprazole  (PROTONIX ) 40 MG tablet      Sig: Take 40 mg by mouth daily.     There is no refill protocol information for this order

## 2024-01-16 MED ORDER — PANTOPRAZOLE SODIUM 40 MG PO TBEC
40.0000 mg | DELAYED_RELEASE_TABLET | Freq: Every day | ORAL | 1 refills | Status: AC
Start: 2024-01-16 — End: ?

## 2024-01-26 DIAGNOSIS — I7410 Embolism and thrombosis of unspecified parts of aorta: Secondary | ICD-10-CM | POA: Diagnosis not present

## 2024-01-26 DIAGNOSIS — I7782 Antineutrophilic cytoplasmic antibody (ANCA) vasculitis: Secondary | ICD-10-CM | POA: Diagnosis not present

## 2024-01-26 DIAGNOSIS — Z7960 Long term (current) use of unspecified immunomodulators and immunosuppressants: Secondary | ICD-10-CM | POA: Diagnosis not present

## 2024-02-02 DIAGNOSIS — M25561 Pain in right knee: Secondary | ICD-10-CM | POA: Diagnosis not present

## 2024-02-02 DIAGNOSIS — Z89511 Acquired absence of right leg below knee: Secondary | ICD-10-CM | POA: Diagnosis not present

## 2024-02-02 DIAGNOSIS — G8929 Other chronic pain: Secondary | ICD-10-CM | POA: Diagnosis not present

## 2024-03-05 ENCOUNTER — Encounter: Payer: Self-pay | Admitting: Cardiovascular Disease

## 2024-03-05 MED ORDER — MIDODRINE HCL 5 MG PO TABS: 5.0000 mg | ORAL_TABLET | Freq: Three times a day (TID) | ORAL | 3 refills | Status: AC

## 2024-03-10 ENCOUNTER — Other Ambulatory Visit: Payer: Self-pay

## 2024-03-10 ENCOUNTER — Inpatient Hospital Stay
Admission: EM | Admit: 2024-03-10 | Discharge: 2024-03-13 | DRG: 871 | Disposition: A | Discharge status: Home / Self Care | Attending: Internal Medicine | Admitting: Internal Medicine

## 2024-03-10 ENCOUNTER — Emergency Department

## 2024-03-10 DIAGNOSIS — Z8249 Family history of ischemic heart disease and other diseases of the circulatory system: Secondary | ICD-10-CM | POA: Diagnosis not present

## 2024-03-10 DIAGNOSIS — Z7952 Long term (current) use of systemic steroids: Secondary | ICD-10-CM

## 2024-03-10 DIAGNOSIS — Z79899 Other long term (current) drug therapy: Secondary | ICD-10-CM | POA: Diagnosis not present

## 2024-03-10 DIAGNOSIS — Z90710 Acquired absence of both cervix and uterus: Secondary | ICD-10-CM

## 2024-03-10 DIAGNOSIS — F419 Anxiety disorder, unspecified: Secondary | ICD-10-CM | POA: Diagnosis present

## 2024-03-10 DIAGNOSIS — Z833 Family history of diabetes mellitus: Secondary | ICD-10-CM | POA: Diagnosis not present

## 2024-03-10 DIAGNOSIS — Z89511 Acquired absence of right leg below knee: Secondary | ICD-10-CM

## 2024-03-10 DIAGNOSIS — K5904 Chronic idiopathic constipation: Secondary | ICD-10-CM | POA: Diagnosis present

## 2024-03-10 DIAGNOSIS — J8410 Pulmonary fibrosis, unspecified: Secondary | ICD-10-CM | POA: Diagnosis present

## 2024-03-10 DIAGNOSIS — I7410 Embolism and thrombosis of unspecified parts of aorta: Secondary | ICD-10-CM

## 2024-03-10 DIAGNOSIS — F32A Depression, unspecified: Secondary | ICD-10-CM | POA: Diagnosis present

## 2024-03-10 DIAGNOSIS — E872 Acidosis, unspecified: Secondary | ICD-10-CM | POA: Diagnosis present

## 2024-03-10 DIAGNOSIS — M313 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: Secondary | ICD-10-CM | POA: Diagnosis present

## 2024-03-10 DIAGNOSIS — Z7951 Long term (current) use of inhaled steroids: Secondary | ICD-10-CM | POA: Diagnosis not present

## 2024-03-10 DIAGNOSIS — I959 Hypotension, unspecified: Secondary | ICD-10-CM | POA: Diagnosis not present

## 2024-03-10 DIAGNOSIS — B9689 Other specified bacterial agents as the cause of diseases classified elsewhere: Secondary | ICD-10-CM

## 2024-03-10 DIAGNOSIS — J208 Acute bronchitis due to other specified organisms: Secondary | ICD-10-CM | POA: Diagnosis present

## 2024-03-10 DIAGNOSIS — Z7901 Long term (current) use of anticoagulants: Secondary | ICD-10-CM

## 2024-03-10 DIAGNOSIS — J157 Pneumonia due to Mycoplasma pneumoniae: Secondary | ICD-10-CM | POA: Diagnosis present

## 2024-03-10 DIAGNOSIS — R062 Wheezing: Secondary | ICD-10-CM

## 2024-03-10 DIAGNOSIS — I7782 Antineutrophilic cytoplasmic antibody (ANCA) vasculitis: Secondary | ICD-10-CM | POA: Diagnosis present

## 2024-03-10 DIAGNOSIS — M069 Rheumatoid arthritis, unspecified: Secondary | ICD-10-CM | POA: Diagnosis present

## 2024-03-10 DIAGNOSIS — I951 Orthostatic hypotension: Secondary | ICD-10-CM | POA: Diagnosis present

## 2024-03-10 DIAGNOSIS — J189 Pneumonia, unspecified organism: Secondary | ICD-10-CM | POA: Diagnosis not present

## 2024-03-10 DIAGNOSIS — A419 Sepsis, unspecified organism: Principal | ICD-10-CM | POA: Diagnosis present

## 2024-03-10 DIAGNOSIS — Z1152 Encounter for screening for COVID-19: Secondary | ICD-10-CM | POA: Diagnosis not present

## 2024-03-10 DIAGNOSIS — H6993 Unspecified Eustachian tube disorder, bilateral: Secondary | ICD-10-CM

## 2024-03-10 DIAGNOSIS — Z87891 Personal history of nicotine dependence: Secondary | ICD-10-CM | POA: Diagnosis not present

## 2024-03-10 DIAGNOSIS — F339 Major depressive disorder, recurrent, unspecified: Secondary | ICD-10-CM

## 2024-03-10 DIAGNOSIS — Z7982 Long term (current) use of aspirin: Secondary | ICD-10-CM | POA: Diagnosis not present

## 2024-03-10 DIAGNOSIS — Z803 Family history of malignant neoplasm of breast: Secondary | ICD-10-CM

## 2024-03-10 LAB — LACTIC ACID, PLASMA
Lactic Acid, Venous: 3.1 mmol/L (ref 0.5–1.9)
Lactic Acid, Venous: 2.2 mmol/L (ref 0.5–1.9)
Lactic Acid, Venous: 3.9 mmol/L (ref 0.5–1.9)

## 2024-03-10 LAB — URINALYSIS, W/ REFLEX TO CULTURE (INFECTION SUSPECTED)
Bacteria, UA: NONE SEEN
Bilirubin Urine: NEGATIVE
Glucose, UA: NEGATIVE mg/dL
Ketones, ur: NEGATIVE mg/dL
Leukocytes,Ua: NEGATIVE
Nitrite: NEGATIVE
Protein, ur: NEGATIVE mg/dL
Specific Gravity, Urine: 1.021 (ref 1.005–1.030)
pH: 5 (ref 5.0–8.0)

## 2024-03-10 LAB — RESPIRATORY PANEL BY PCR

## 2024-03-10 LAB — CBC
HCT: 38.6 % (ref 36.0–46.0)
Hemoglobin: 12.5 g/dL (ref 12.0–15.0)
MCH: 30.2 pg (ref 26.0–34.0)
MCHC: 32.4 g/dL (ref 30.0–36.0)
MCV: 93.2 fL (ref 80.0–100.0)
Platelets: 360 K/uL (ref 150–400)
RBC: 4.14 MIL/uL (ref 3.87–5.11)
RDW: 15.6 % — ABNORMAL HIGH (ref 11.5–15.5)
WBC: 13.6 K/uL — ABNORMAL HIGH (ref 4.0–10.5)
nRBC: 0 % (ref 0.0–0.2)

## 2024-03-10 LAB — COMPREHENSIVE METABOLIC PANEL WITH GFR
ALT: 20 U/L (ref 0–44)
AST: 25 U/L (ref 15–41)
Albumin: 4.1 g/dL (ref 3.5–5.0)
Alkaline Phosphatase: 105 U/L (ref 38–126)
Anion gap: 13 (ref 5–15)
BUN: 15 mg/dL (ref 6–20)
CO2: 23 mmol/L (ref 22–32)
Calcium: 8.7 mg/dL — ABNORMAL LOW (ref 8.9–10.3)
Chloride: 105 mmol/L (ref 98–111)
Creatinine, Ser: 0.52 mg/dL (ref 0.44–1.00)
GFR, Estimated: >60 mL/min
Glucose, Bld: 106 mg/dL — ABNORMAL HIGH (ref 70–99)
Potassium: 3.5 mmol/L (ref 3.5–5.1)
Sodium: 142 mmol/L (ref 135–145)
Total Bilirubin: 0.2 mg/dL (ref 0.0–1.2)
Total Protein: 6.4 g/dL — ABNORMAL LOW (ref 6.5–8.1)

## 2024-03-10 LAB — MRSA NEXT GEN BY PCR, NASAL: MRSA by PCR Next Gen: NOT DETECTED

## 2024-03-10 LAB — RESP PANEL BY RT-PCR (RSV, FLU A&B, COVID)  RVPGX2
Influenza A by PCR: NEGATIVE
Influenza B by PCR: NEGATIVE
Resp Syncytial Virus by PCR: NEGATIVE
SARS Coronavirus 2 by RT PCR: NEGATIVE

## 2024-03-10 LAB — TROPONIN T, HIGH SENSITIVITY
Troponin T High Sensitivity: <15 ng/L (ref 0–19)
Troponin T High Sensitivity: <15 ng/L (ref 0–19)

## 2024-03-10 LAB — GROUP A STREP BY PCR: Group A Strep by PCR: NOT DETECTED

## 2024-03-10 LAB — PROCALCITONIN: Procalcitonin: 0.42 ng/mL

## 2024-03-10 MED ORDER — ALBUTEROL SULFATE (2.5 MG/3ML) 0.083% IN NEBU
3.0000 mL | INHALATION_SOLUTION | Freq: Four times a day (QID) | RESPIRATORY_TRACT | Status: DC | PRN
  Administered 2024-03-11: 3 mL via RESPIRATORY_TRACT
  Filled 2024-03-10: qty 3

## 2024-03-10 MED ORDER — PREDNISONE 2.5 MG PO TABS
2.5000 mg | ORAL_TABLET | Freq: Every day | ORAL | Status: DC
  Administered 2024-03-11 – 2024-03-13 (×3): 2.5 mg via ORAL
  Filled 2024-03-10 (×2): qty 1

## 2024-03-10 MED ORDER — PIRFENIDONE 267 MG PO CAPS: 1.0000 | ORAL_CAPSULE | Freq: Three times a day (TID) | ORAL | Status: DC

## 2024-03-10 MED ORDER — FLUTICASONE PROPIONATE HFA 44 MCG/ACT IN AERO: 1.0000 | INHALATION_SPRAY | Freq: Every day | RESPIRATORY_TRACT | Status: DC

## 2024-03-10 MED ORDER — ATORVASTATIN CALCIUM 20 MG PO TABS
40.0000 mg | ORAL_TABLET | Freq: Every evening | ORAL | Status: DC
  Administered 2024-03-10 – 2024-03-12 (×3): 40 mg via ORAL
  Filled 2024-03-10 (×3): qty 2

## 2024-03-10 MED ORDER — SODIUM CHLORIDE 0.9 % IV SOLN
2.0000 g | INTRAVENOUS | Status: DC
  Administered 2024-03-11 – 2024-03-12 (×2): 2 g via INTRAVENOUS
  Filled 2024-03-10 (×2): qty 20

## 2024-03-10 MED ORDER — ONDANSETRON HCL 4 MG/2ML IJ SOLN
4.0000 mg | Freq: Once | INTRAMUSCULAR | Status: AC
  Administered 2024-03-10: 4 mg via INTRAVENOUS
  Filled 2024-03-10: qty 2

## 2024-03-10 MED ORDER — FLUTICASONE PROPIONATE 50 MCG/ACT NA SUSP: 2.0000 | Freq: Every day | NASAL | Status: DC | PRN

## 2024-03-10 MED ORDER — SODIUM CHLORIDE 0.9 % IV SOLN
500.0000 mg | Freq: Once | INTRAVENOUS | Status: AC
  Administered 2024-03-10: 500 mg via INTRAVENOUS
  Filled 2024-03-10: qty 5

## 2024-03-10 MED ORDER — HYDROCODONE-ACETAMINOPHEN 5-325 MG PO TABS
1.0000 | ORAL_TABLET | ORAL | Status: DC | PRN
  Administered 2024-03-10: 1 via ORAL
  Administered 2024-03-12 (×2): 2 via ORAL
  Administered 2024-03-12: 1 via ORAL
  Administered 2024-03-13: 2 via ORAL
  Filled 2024-03-10: qty 1

## 2024-03-10 MED ORDER — ACETAMINOPHEN 500 MG PO TABS
1000.0000 mg | ORAL_TABLET | Freq: Once | ORAL | Status: AC
  Administered 2024-03-10: 1000 mg via ORAL
  Filled 2024-03-10: qty 2

## 2024-03-10 MED ORDER — ONDANSETRON HCL 4 MG/2ML IJ SOLN: 4.0000 mg | Freq: Four times a day (QID) | INTRAMUSCULAR | Status: DC | PRN

## 2024-03-10 MED ORDER — METHYLPREDNISOLONE SODIUM SUCC 125 MG IJ SOLR
125.0000 mg | Freq: Once | INTRAMUSCULAR | Status: AC
  Administered 2024-03-10: 125 mg via INTRAVENOUS
  Filled 2024-03-10: qty 2

## 2024-03-10 MED ORDER — ONDANSETRON HCL 4 MG PO TABS: 4.0000 mg | ORAL_TABLET | Freq: Four times a day (QID) | ORAL | Status: DC | PRN

## 2024-03-10 MED ORDER — CITALOPRAM HYDROBROMIDE 20 MG PO TABS
40.0000 mg | ORAL_TABLET | Freq: Every day | ORAL | Status: DC
  Administered 2024-03-10 – 2024-03-13 (×4): 40 mg via ORAL
  Filled 2024-03-10 (×2): qty 2

## 2024-03-10 MED ORDER — BUSPIRONE HCL 5 MG PO TABS: 7.5000 mg | ORAL_TABLET | Freq: Two times a day (BID) | ORAL | Status: DC

## 2024-03-10 MED ORDER — MIDODRINE HCL 5 MG PO TABS
5.0000 mg | ORAL_TABLET | Freq: Three times a day (TID) | ORAL | Status: DC
  Administered 2024-03-10 – 2024-03-13 (×10): 5 mg via ORAL
  Filled 2024-03-10 (×8): qty 1

## 2024-03-10 MED ORDER — GABAPENTIN 100 MG PO CAPS
100.0000 mg | ORAL_CAPSULE | Freq: Every day | ORAL | Status: DC
  Administered 2024-03-10 – 2024-03-12 (×3): 100 mg via ORAL
  Filled 2024-03-10 (×2): qty 1

## 2024-03-10 MED ORDER — KETOROLAC TROMETHAMINE 30 MG/ML IJ SOLN
15.0000 mg | Freq: Once | INTRAMUSCULAR | Status: AC
  Administered 2024-03-10: 15 mg via INTRAVENOUS
  Filled 2024-03-10: qty 1

## 2024-03-10 MED ORDER — ASPIRIN 81 MG PO TBEC
81.0000 mg | DELAYED_RELEASE_TABLET | Freq: Every day | ORAL | Status: DC
  Administered 2024-03-10 – 2024-03-13 (×4): 81 mg via ORAL
  Filled 2024-03-10 (×3): qty 1

## 2024-03-10 MED ORDER — SODIUM CHLORIDE 0.9 % IV BOLUS
1000.0000 mL | Freq: Once | INTRAVENOUS | Status: AC
  Administered 2024-03-10: 1000 mL via INTRAVENOUS

## 2024-03-10 MED ORDER — SODIUM CHLORIDE 0.9 % IV BOLUS
2000.0000 mL | Freq: Once | INTRAVENOUS | Status: AC
  Administered 2024-03-10: 2000 mL via INTRAVENOUS

## 2024-03-10 MED ORDER — IPRATROPIUM-ALBUTEROL 0.5-2.5 (3) MG/3ML IN SOLN
6.0000 mL | Freq: Once | RESPIRATORY_TRACT | Status: AC
  Administered 2024-03-10: 6 mL via RESPIRATORY_TRACT
  Filled 2024-03-10: qty 6

## 2024-03-10 MED ORDER — SODIUM CHLORIDE 0.9 % IV SOLN
2.0000 g | Freq: Once | INTRAVENOUS | Status: AC
  Administered 2024-03-10: 2 g via INTRAVENOUS
  Filled 2024-03-10: qty 20

## 2024-03-10 MED ORDER — ACETAMINOPHEN 325 MG PO TABS
650.0000 mg | ORAL_TABLET | Freq: Four times a day (QID) | ORAL | Status: DC | PRN
  Administered 2024-03-10 – 2024-03-13 (×5): 650 mg via ORAL
  Filled 2024-03-10 (×4): qty 2

## 2024-03-10 MED ORDER — AZITHROMYCIN 500 MG PO TABS
500.0000 mg | ORAL_TABLET | Freq: Every day | ORAL | Status: DC
  Administered 2024-03-11 – 2024-03-13 (×3): 500 mg via ORAL
  Filled 2024-03-10 (×2): qty 1

## 2024-03-10 MED ORDER — SODIUM CHLORIDE 0.9 % IV BOLUS
500.0000 mL | Freq: Once | INTRAVENOUS | Status: AC
  Administered 2024-03-10: 500 mL via INTRAVENOUS

## 2024-03-10 MED ORDER — PANTOPRAZOLE SODIUM 40 MG PO TBEC
40.0000 mg | DELAYED_RELEASE_TABLET | Freq: Every day | ORAL | Status: DC
  Administered 2024-03-10 – 2024-03-13 (×4): 40 mg via ORAL
  Filled 2024-03-10 (×2): qty 1

## 2024-03-10 MED ORDER — ACETAMINOPHEN 650 MG RE SUPP: 650.0000 mg | Freq: Four times a day (QID) | RECTAL | Status: DC | PRN

## 2024-03-10 MED ORDER — LINACLOTIDE 145 MCG PO CAPS: 290.0000 ug | ORAL_CAPSULE | Freq: Every day | ORAL | Status: DC

## 2024-03-10 MED ORDER — APIXABAN 5 MG PO TABS
5.0000 mg | ORAL_TABLET | Freq: Two times a day (BID) | ORAL | Status: DC
  Administered 2024-03-10 – 2024-03-13 (×7): 5 mg via ORAL
  Filled 2024-03-10 (×4): qty 1

## 2024-03-10 NOTE — ED Notes (Signed)
 Drew/sent 1 set cultures with labs.

## 2024-03-10 NOTE — ED Triage Notes (Signed)
 Pt to ED for body aches, HA, chills, fever, and shoulder and neck pain since today. Temp was 102, she took tylenol  and threw it up. Also feeling SOB since this AM, worse when supine and has hx pulmonary fibrosis. Also takes methotrexate for RAand threw it up today. Takes Eliquis  BID, she thinks she threw this up as well.

## 2024-03-10 NOTE — ED Provider Notes (Addendum)
 "  [facility] Provider Note    Event Date/Time   First MD Initiated Contact with Patient 03/10/24 [phone]     (approximate)   History   Shortness of Breath and Fever   HPI  Alexandra [name] is a 53 y.o. female who presents to the ED for evaluation of Shortness of Breath and Fever   I review a PCP visit from October.  History of RA, Wegener's and pulmonary fibrosis.  Prednisone  2.5 mg/day and methotrexate weekly.  Bactrim  DS M,W,F prophylactically.  Anticoagulated on Eliquis   Patient presents to the ED with fevers, cough and shortness of breath since about 5 AM this morning.  She reports feeling okay yesterday but awoke with the symptoms today.   Physical Exam   Triage Vital Signs: ED Triage Vitals  Encounter Vitals Group     BP 03/10/24 0812 (!) 102/59     Girls Systolic BP Percentile --      Girls Diastolic BP Percentile --      Boys Systolic BP Percentile --      Boys Diastolic BP Percentile --      Pulse Rate 03/10/24 0812 (!) 116     Resp 03/10/24 0812 (!) 22     Temp 03/10/24 0817 (!) 101.8 F (38.8 C)     Temp Source 03/10/24 0817 Oral     SpO2 03/10/24 0812 94 %     Weight 03/10/24 0813 146 lb (66.2 kg)     Height 03/10/24 0813 5' 2 (1.575 m)     Head Circumference --      Peak Flow --      Pain Score 03/10/24 0814 10     Pain Loc --      Pain Education --      Exclude from Growth Chart --     Most recent vital signs: Vitals:   03/10/24 1011 03/10/24 1041  BP: (!) 106/58   Pulse: (!) 111   Resp: 16   Temp:  99.6 F (37.6 C)  SpO2: 98%     General: Awake, no distress.  Seems uncomfortable but generally pleasant and conversational CV:  Good peripheral perfusion.  Resp:  Mild tachypnea to the low 20s, no distress.  Faint expiratory wheezes throughout and slightly decreased airflow. Abd:  No distention.  Soft and nontender MSK:  No deformity noted.  Neuro:  No focal deficits appreciated. Other:     ED Results / Procedures /  Treatments   Labs (all labs ordered are listed, but only abnormal results are displayed) Labs Reviewed  CBC - Abnormal; Notable for the following components:      Result Value   WBC 13.6 (*)    RDW 15.6 (*)    All other components within normal limits  LACTIC ACID, PLASMA - Abnormal; Notable for the following components:   Lactic Acid, Venous 3.1 (*)    All other components within normal limits  COMPREHENSIVE METABOLIC PANEL WITH GFR - Abnormal; Notable for the following components:   Glucose, Bld 106 (*)    Calcium  8.7 (*)    Total Protein 6.4 (*)    All other components within normal limits  RESP PANEL BY RT-PCR (RSV, FLU A&B, COVID)  RVPGX2  GROUP A STREP BY PCR  CULTURE, BLOOD (ROUTINE X 2)  CULTURE, BLOOD (ROUTINE X 2)  RESPIRATORY PANEL BY PCR  PROCALCITONIN  LACTIC ACID, PLASMA  URINALYSIS, W/ REFLEX TO CULTURE (INFECTION SUSPECTED)  TROPONIN T, HIGH SENSITIVITY  TROPONIN  T, HIGH SENSITIVITY    EKG Sinus rhythm with a rate of 106 bpm.  Normal axis and intervals.  Subtle ST depressions fairly diffusely and mild elevations to aVR.  No STEMI criteria.  More consistent with subendocardial ischemia  RADIOLOGY 2 view CXR interpreted by me with increased bibasilar interstitial opacities left greater than right  Official radiology report(s): DG Chest 2 View Result Date: 03/10/2024 CLINICAL DATA:  Shortness of breath. EXAM: CHEST - 2 VIEW COMPARISON:  10/12/2023 FINDINGS: Similar appearing of basilar predominant chronic interstitial changes. No dense focal airspace consolidation or substantial pleural effusion. The cardio pericardial silhouette is enlarged. No acute bony abnormality. IMPRESSION: Chronic interstitial changes without acute cardiopulmonary findings. Electronically Signed   By: Camellia Candle M.D.   On: 03/10/2024 10:19    PROCEDURES and INTERVENTIONS:  .1-3 Lead EKG Interpretation  Performed by: Claudene Rover, MD Authorized by: Claudene Rover, MD      Interpretation: abnormal     ECG rate:  109   ECG rate assessment: tachycardic     Rhythm: sinus tachycardia     Ectopy: none     Conduction: normal   .Critical Care  Performed by: Claudene Rover, MD Authorized by: Claudene Rover, MD   Critical care provider statement:    Critical care time (minutes):  30   Critical care time was exclusive of:  Separately billable procedures and treating other patients   Critical care was necessary to treat or prevent imminent or life-threatening deterioration of the following conditions:  Sepsis   Critical care was time spent personally by me on the following activities:  Development of treatment plan with patient or surrogate, discussions with consultants, evaluation of patient's response to treatment, examination of patient, ordering and review of laboratory studies, ordering and review of radiographic studies, ordering and performing treatments and interventions, pulse oximetry, re-evaluation of patient's condition and review of old charts   Medications  azithromycin  (ZITHROMAX ) 500 mg in sodium chloride  0.9 % 250 mL IVPB (500 mg Intravenous New Bag/Given 03/10/24 1037)  ondansetron  (ZOFRAN ) injection 4 mg (4 mg Intravenous Given 03/10/24 0944)  ketorolac  (TORADOL ) 30 MG/ML injection 15 mg (15 mg Intravenous Given 03/10/24 0944)  acetaminophen  (TYLENOL ) tablet 1,000 mg (1,000 mg Oral Given 03/10/24 0933)  sodium chloride  0.9 % bolus 1,000 mL (0 mLs Intravenous Stopped 03/10/24 1029)  ipratropium-albuterol  (DUONEB) 0.5-2.5 (3) MG/3ML nebulizer solution 6 mL (6 mLs Nebulization Given 03/10/24 0952)  methylPREDNISolone  sodium succinate (SOLU-MEDROL ) 125 mg/2 mL injection 125 mg (125 mg Intravenous Given 03/10/24 0947)  cefTRIAXone  (ROCEPHIN ) 2 g in sodium chloride  0.9 % 100 mL IVPB (0 g Intravenous Stopped 03/10/24 1029)     IMPRESSION / MDM / ASSESSMENT AND PLAN / ED COURSE  I reviewed the triage vital signs and the nursing notes.  Differential  diagnosis includes, but is not limited to, ACS, PTX, PNA, muscle strain/spasm, PE, dissection, anxiety, pleural effusion  {Patient presents with symptoms of an acute illness or injury that is potentially life-threatening.  Immunocompromised patient with history of ILD presents with fever, cough and shortness of breath concerning for sepsis pneumonia requiring medical admission.  Febrile, tachycardic but no hypoxia.  Minimal tachypnea, mild wheezing throughout.  Leukocytosis and lactic acidosis are noted.  Essentially normal metabolic panel, negative viral respiratory swabs and negative troponins.  CXR without clear infiltrate but all of her symptoms are respiratory in nature and this early in the course I am most concerned about pneumonia.  After drawing cultures she is provided CAP coverage  antibiotics.  Will consult with medicine for admission  Clinical Course as of 03/10/24 1056  Sat Mar 10, 2024  1038 Reassessed, discussed workup, plan of care.  She is agreeable with admission [DS]  1055 I consult with medicine who agrees to admit [DS]    Clinical Course User Index [DS] Claudene Rover, MD     FINAL CLINICAL IMPRESSION(S) / ED DIAGNOSES   Final diagnoses:  Sepsis due to pneumonia [facility])  Wheezing     Rx / DC Orders   ED Discharge Orders     None        Note:  This document was prepared using Dragon voice recognition software and may include unintentional dictation errors.   Claudene Rover, MD 03/10/24 1041    Claudene Rover, MD 03/10/24 1056  "

## 2024-03-10 NOTE — Plan of Care (Signed)

## 2024-03-10 NOTE — H&P (Addendum)
 " History and Physical    Alexandra [name] FMW:[id number] DOB: Jul 28, 1970 DOA: 03/10/2024  PCP: Alexandra Coyer, MD (Confirm with patient/family/NH records and if not entered, this has to be entered at [facility] point of entry) Patient coming from: Home  I have personally briefly reviewed patient's old medical records in [facility]  Chief Complaint: Fever chills, shortness of breath muscle aching  HPI: Alexandra [name] is a 53 y.o. female with medical history significant of ANCA vasculitis, granulomatosis, rheumatoid lungs, on DMARDs, with chronic steroid, PAD status post right BKA, status post aortic thrombosis and thrombectomy in 2022, orthostatic hypotension on midodrine , erosive gastritis, presented with new onset of fever chills, muscle aching, shortness of breath, muscle aching.  Patient woke up this morning with strong feeling of cycles of chills and fever, checked her temperature 102.  Meantime she also feels left-sided neck and shoulder and upper arm soreness, has felt palpitations and increasing shortness of breath but denies any cough.  She denied any abdominal pain no urinary symptoms no diarrhea, denied any sore throat or runny nose, no headache.  En route to hospital she felt nauseous and vomited x 1 of stomach content.  She reported fairly controlled of her RA with no acute joint pain.  Denied any headache vision changes.  Denied any recent sick contact.  ED Course: Temperature 101.8 tachycardia heart rate in the 100-1 tens, blood pressure 102/59 oxygenation 98% on room air.  Chest x-ray showed similar bilateral lower lungs interstitial changes, blood work showed WBC 13.6 hemoglobin 12.5 lactic acid 3.1> 2.2 BUN 15 creatinine 0.5.  Patient was given IV bolus total of 3 L and started on ceftriaxone  and azithromycin .  Review of Systems: As per HPI otherwise 14 point review of systems negative.    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)     Past Surgical History:  Procedure Laterality Date   ABDOMINAL HYSTERECTOMY     AMPUTATION Right 09/12/2020   Procedure: AMPUTATION BELOW KNEE;  Surgeon: Marea Selinda RAMAN, MD;  Location: [facility] ORS;  Service: Vascular;  Laterality: Right;   BIOPSY  11/10/2022   Procedure: BIOPSY;  Surgeon: Therisa Bi, MD;  Location: [facility] ENDOSCOPY;  Service: Gastroenterology;;   BREAST CYST EXCISION Left 2006   COLONOSCOPY WITH PROPOFOL  N/A 11/10/2022   Procedure: COLONOSCOPY WITH PROPOFOL ;  Surgeon: Therisa Bi, MD;  Location[facility] ENDOSCOPY;  Service: Gastroenterology;  Laterality: N/A;   ENDOVASCULAR STENT INSERTION N/A 09/08/2020   Procedure: ENDOVASCULAR STENT GRAFT INSERTION;  Surgeon: Marea Selinda RAMAN, MD;  Location: [facility] ORS;  Service: Vascular;  Laterality: N/A;   ESOPHAGOGASTRODUODENOSCOPY (EGD) WITH PROPOFOL  N/A 11/10/2022   Procedure: ESOPHAGOGASTRODUODENOSCOPY (EGD) WITH PROPOFOL ;  Surgeon: Therisa Bi, MD;  Location: [facility] ENDOSCOPY;  Service: Gastroenterology;  Laterality: N/A;   LOWER EXTREMITY ANGIOGRAPHY Right 09/01/2020   Procedure: Lower Extremity Angiography;  Surgeon: Marea Selinda RAMAN, MD;  Location: [facility] INVASIVE CV LAB;  Service: Cardiovascular;  Laterality: Right;   LOWER EXTREMITY ANGIOGRAPHY Left 10/01/2020   Procedure: LOWER EXTREMITY ANGIOGRAPHY;  Surgeon: Marea Selinda RAMAN, MD;  Location: [facility] INVASIVE CV LAB;  Service: Cardiovascular;  Laterality: Left;   LOWER EXTREMITY INTERVENTION Right 09/07/2020   Procedure: LOWER EXTREMITY INTERVENTION;  Surgeon: Serene Gaile ORN, MD;  Location: [facility] INVASIVE CV LAB;  Service: Cardiovascular;  Laterality: Right;     reports that she has been smoking cigarettes. She has a 29 pack-year smoking history. She has never used smokeless tobacco. She reports  that she does not currently use alcohol. She reports that she does not use drugs.  Allergies[1]  Family History  Problem Relation Age of Onset   Breast cancer Mother 54   Diabetes Father    Congestive Heart Failure Father    Diabetes Brother    Breast cancer Maternal Aunt    Breast cancer Maternal Aunt        Maternal Great Aunt    Diabetes Paternal Aunt    Breast cancer Maternal Grandmother    Diabetes Maternal Grandfather    Breast cancer Cousin 31     Prior to Admission medications  Medication Sig Start Date End Date Taking? Authorizing Provider  albuterol  (VENTOLIN  HFA) 108 (90 Base) MCG/ACT inhaler Inhale 1-2 puffs into the lungs every 6 (six) hours as needed for wheezing or shortness of breath (cough). 07/18/23   Moishe Chiquita HERO, NP  apixaban  (ELIQUIS ) 5 MG TABS tablet Take 1 tablet (5 mg total) by mouth 2 (two) times daily. 09/29/20   Brown, Fallon E, NP  aspirin  EC 81 MG EC tablet Take 1 tablet (81 mg total) by mouth daily. Swallow whole. 10/02/20   Stegmayer, Suzen LABOR, PA-C  atorvastatin  (LIPITOR) 40 MG tablet TAKE 1 TABLET(40 MG) BY MOUTH DAILY 12/16/23   Simmons-Robinson, Rockie, MD  busPIRone  (BUSPAR ) 7.5 MG tablet Take 1 tablet (7.5 mg total) by mouth 2 (two) times daily. 01/06/24   Simmons-Robinson, Rockie, MD  citalopram  (CELEXA ) 40 MG tablet Take 1 tablet (40 mg total) by mouth daily. 11/17/23   Simmons-Robinson, Rockie, MD  ferrous gluconate  (FERGON) 324 MG tablet Take 1 tablet (324 mg total) by mouth daily with breakfast. 07/26/23   Franchot Novel, MD  fluticasone  (FLONASE ) 50 MCG/ACT nasal spray Place 2 sprays into both nostrils daily. 11/04/23   Simmons-Robinson, Rockie, MD  fluticasone  (FLOVENT  HFA) 44 MCG/ACT inhaler Inhale 1 puff into the lungs daily. 07/18/23   Moishe Chiquita HERO, NP  folic acid (FOLVITE) 400 MCG tablet Take 400 mcg by mouth daily.    [provider]  gabapentin  (NEURONTIN ) 100 MG capsule Take 1 capsule (100 mg total) by mouth at bedtime.  01/06/24   Simmons-Robinson, Rockie, MD  gabapentin  (NEURONTIN ) 100 MG capsule Take 1 capsule (100 mg total) by mouth 2 (two) times daily as needed. 01/06/24   Simmons-Robinson, Rockie, MD  linaclotide  (LINZESS ) 290 MCG CAPS capsule Take 1 capsule (290 mcg total) by mouth daily before breakfast. Patient taking differently: Take 290 mcg by mouth as needed. 11/23/22 01/04/24  [place], PA-C  methotrexate (RHEUMATREX) 10 MG tablet Take 60 mg by mouth once a week. Caution: Chemotherapy. Protect from light.    [provider]  midodrine  (PROAMATINE ) 5 MG tablet Take 1 tablet (5 mg total) by mouth 3 (three) times daily with meals. 03/05/24 03/05/25  Argentina Clap, MD  pantoprazole  (PROTONIX ) 40 MG tablet Take 1 tablet (40 mg total) by mouth daily. 01/16/24   Simmons-Robinson, Rockie, MD  Pirfenidone  267 MG CAPS Take 1 capsule by mouth in the morning, at noon, and at bedtime.    [provider]  predniSONE  (DELTASONE ) 2.5 MG tablet Take 2.5 mg by mouth daily with breakfast.    [provider]    Physical Exam: Vitals:  03/10/24 1055 03/10/24 1100 03/10/24 1115 03/10/24 1130  BP: (!) 99/53 (!) 81/52 (!) 92/53 (!) 84/55  Pulse: (!) 111 (!) 107 (!) 107 (!) 104  Resp:    16  Temp:      TempSrc:      SpO2: 95% 97% 95% 97%  Weight:      Height:        Constitutional: NAD, calm, comfortable Vitals:   03/10/24 1055 03/10/24 1100 03/10/24 1115 03/10/24 1130  BP: (!) 99/53 (!) 81/52 (!) 92/53 (!) 84/55  Pulse: (!) 111 (!) 107 (!) 107 (!) 104  Resp:    16  Temp:      TempSrc:      SpO2: 95% 97% 95% 97%  Weight:      Height:       Eyes: PERRL, lids and conjunctivae normal ENMT: Mucous membranes are moist. Posterior pharynx clear of any exudate or lesions.Normal dentition.  Neck: normal, supple, no masses, no thyromegaly Respiratory: clear to auscultation bilaterally, no wheezing, crackles on bilateral lower fields, increasing respiratory effort. No accessory  muscle use.  Cardiovascular: Regular rate and rhythm, no murmurs / rubs / gallops. No extremity edema. 2+ pedal pulses. No carotid bruits.  Abdomen: no tenderness, no masses palpated. No hepatosplenomegaly. Bowel sounds positive.  Musculoskeletal: no clubbing / cyanosis. No joint deformity upper and lower extremities. Good ROM, no contractures. Normal muscle tone.  Skin: no rashes, lesions, ulcers. No induration Neurologic: CN 2-12 grossly intact. Sensation intact, DTR normal. Strength 5/5 in all 4.  Psychiatric: Normal judgment and insight. Alert and oriented x 3. Normal mood.    Labs on Admission: I have personally reviewed following labs and imaging studies  CBC: Recent Labs  Lab 03/10/24 0819  WBC 13.6*  HGB 12.5  HCT 38.6  MCV 93.2  PLT 360   Basic Metabolic Panel: Recent Labs  Lab 03/10/24 0819  NA 142  K 3.5  CL 105  CO2 23  GLUCOSE 106*  BUN 15  CREATININE 0.52  CALCIUM  8.7*   GFR: Estimated Creatinine Clearance: 71.7 mL/min (by C-G formula based on SCr of 0.52 mg/dL). Liver Function Tests: Recent Labs  Lab 03/10/24 0819  AST 25  ALT 20  ALKPHOS 105  BILITOT 0.2  PROT 6.4*  ALBUMIN 4.1   No results for input(s): LIPASE, AMYLASE in the last 168 hours. No results for input(s): AMMONIA in the last 168 hours. Coagulation Profile: No results for input(s): INR, PROTIME in the last 168 hours. Cardiac Enzymes: No results for input(s): CKTOTAL, CKMB, CKMBINDEX, TROPONINI in the last 168 hours. BNP (last 3 results) No results for input(s): PROBNP in the last 8760 hours. HbA1C: No results for input(s): HGBA1C in the last 72 hours. CBG: No results for input(s): GLUCAP in the last 168 hours. Lipid Profile: No results for input(s): CHOL, HDL, LDLCALC, TRIG, CHOLHDL, LDLDIRECT in the last 72 hours. Thyroid  Function Tests: No results for input(s): TSH, T4TOTAL, FREET4, T3FREE, THYROIDAB in the last 72 hours. Anemia  Panel: No results for input(s): VITAMINB12, FOLATE, FERRITIN, TIBC, IRON , RETICCTPCT in the last 72 hours. Urine analysis:    Component Value Date/Time   COLORURINE YELLOW (A) 03/10/2024 1005   APPEARANCEUR HAZY (A) 03/10/2024 1005   APPEARANCEUR Clear 09/17/2013 1229   LABSPEC 1.021 03/10/2024 1005   LABSPEC 1.008 09/17/2013 1229   PHURINE 5.0 03/10/2024 1005   GLUCOSEU NEGATIVE 03/10/2024 1005   GLUCOSEU Negative 09/17/2013 1229   HGBUR SMALL (A) 03/10/2024 1005   BILIRUBINUR  NEGATIVE 03/10/2024 1005   BILIRUBINUR Negative 09/17/2013 1229   KETONESUR NEGATIVE 03/10/2024 1005   PROTEINUR NEGATIVE 03/10/2024 1005   NITRITE NEGATIVE 03/10/2024 1005   LEUKOCYTESUR NEGATIVE 03/10/2024 1005   LEUKOCYTESUR Negative 09/17/2013 1229    Radiological Exams on Admission: DG Chest 2 View Result Date: 03/10/2024 CLINICAL DATA:  Shortness of breath. EXAM: CHEST - 2 VIEW COMPARISON:  10/12/2023 FINDINGS: Similar appearing of basilar predominant chronic interstitial changes. No dense focal airspace consolidation or substantial pleural effusion. The cardio pericardial silhouette is enlarged. No acute bony abnormality. IMPRESSION: Chronic interstitial changes without acute cardiopulmonary findings. Electronically Signed   By: Camellia Candle M.D.   On: 03/10/2024 10:19    EKG: Independently reviewed.  Sinus tachycardia, no acute ST changes.  Assessment/Plan Principal Problem:   Sepsis (HCC)  (please populate well all problems here in Problem List. (For example, if patient is on BP meds at home and you resume or decide to hold them, it is a problem that needs to be her. Same for CAD, COPD, HLD and so on)  Sepsis without acute organ damage - Sepsis as evidenced by fever, tachycardia tachypneic, elevated lactic acid, source of infection currently is unknown.  clinically suspect to be atypical pneumonia. - Received 3 L of IV fluid bolus and currently appears to be euvolemic.  Hold off  further IV fluid bolus. - Continue CAP coverage with ceftriaxone  and azithromycin .  Check atypical pneumonia study Legionella and mycoplasma.  Check respiratory viral panel. - Other DDx, continue to look for other source of infection, blood culture sent, UA is pending to rule out UTI.  CAP, bacterial - Clinical suspect atypical pneumonia.  Continue coverage of ceftriaxone  and azithromycin . - Breathing treatment - Other Dx, low suspicion for rheumatoid lung at this point as patient's x-ray appeared to be stable compared to previous chest x-ray and RA appears to be fairly controlled.  ANCA associated vasculitis Pulmonary fibrosis RA on DMARDs - Appears to be stable - Continue prednisone   Orthostatic hypotension - Continue midodrine   History of aortic thrombosis status post thrombectomy Continue aspirin  and Eliquis   DVT prophylaxis: Eliquis  Code Status: Full code Family Communication: None at bedside Disposition Plan: Patient sick with sepsis with unknown source, requiring further IV antibiotics, expect more than 2 midnight hospital stay. Consults called: None Admission status: Telemetry admission   Cort ONEIDA Mana MD [facility] Pager [phone]  03/10/2024, 11:58 AM        [1] No Known Allergies  "

## 2024-03-10 NOTE — H&P (Incomplete Revision)
 " History and Physical    PAITYNN MIKUS FMW:[id number] DOB: 15-Sep-1970 DOA: 03/10/2024  PCP: Sharma Coyer, MD (Confirm with patient/family/NH records and if not entered, this has to be entered at [facility] point of entry) Patient coming from: Home  I have personally briefly reviewed patient's old medical records in [facility]  Chief Complaint: Fever chills, shortness of breath muscle aching  HPI: Alexandra [name] is a 53 y.o. female with medical history significant of ANCA vasculitis, granulomatosis, rheumatoid lungs, on DMARDs, with chronic steroid, PAD status post right BKA, status post aortic thrombosis and thrombectomy in 2022, orthostatic hypotension on midodrine , erosive gastritis, presented with new onset of fever chills, muscle aching, shortness of breath, muscle aching.  Patient woke up this morning with strong feeling of cycles of chills and fever, checked her temperature 102.  Meantime she also feels left-sided neck and shoulder and upper arm soreness, has felt palpitations and increasing shortness of breath but denies any cough.  She denied any abdominal pain no urinary symptoms no diarrhea, denied any sore throat or runny nose, no headache.  En route to hospital she felt nauseous and vomited x 1 of stomach content.  She reported fairly controlled of her RA with no acute joint pain.  Denied any headache vision changes.  Denied any recent sick contact.  ED Course: Temperature 101.8 tachycardia heart rate in the 100-1 tens, blood pressure 102/59 oxygenation 98% on room air.  Chest x-ray showed similar bilateral lower lungs interstitial changes, blood work showed WBC 13.6 hemoglobin 12.5 lactic acid 3.1> 2.2 BUN 15 creatinine 0.5.  Patient was given IV bolus total of 3 L and started on ceftriaxone  and azithromycin .  Review of Systems: As per HPI otherwise 14 point review of systems negative.    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)     Past Surgical History:  Procedure Laterality Date   ABDOMINAL HYSTERECTOMY     AMPUTATION Right 09/12/2020   Procedure: AMPUTATION BELOW KNEE;  Surgeon: Marea Selinda RAMAN, MD;  Location: [facility] ORS;  Service: Vascular;  Laterality: Right;   BIOPSY  11/10/2022   Procedure: BIOPSY;  Surgeon: Therisa Bi, MD;  Location: [facility] ENDOSCOPY;  Service: Gastroenterology;;   BREAST CYST EXCISION Left 2006   COLONOSCOPY WITH PROPOFOL  N/A 11/10/2022   Procedure: COLONOSCOPY WITH PROPOFOL ;  Surgeon: Therisa Bi, MD;  Location: [facility] ENDOSCOPY;  Service: Gastroenterology;  Laterality: N/A;   ENDOVASCULAR STENT INSERTION N/A 09/08/2020   Procedure: ENDOVASCULAR STENT GRAFT INSERTION;  Surgeon: Marea Selinda RAMAN, MD;  Location: [facility] ORS;  Service: Vascular;  Laterality: N/A;   ESOPHAGOGASTRODUODENOSCOPY (EGD) WITH PROPOFOL  N/A 11/10/2022   Procedure: ESOPHAGOGASTRODUODENOSCOPY (EGD) WITH PROPOFOL ;  Surgeon: Therisa Bi, MD;  Location: [facility] ENDOSCOPY;  Service: Gastroenterology;  Laterality: N/A;   LOWER EXTREMITY ANGIOGRAPHY Right 09/01/2020   Procedure: Lower Extremity Angiography;  Surgeon: Marea Selinda RAMAN, MD;  Location: [facility] INVASIVE CV LAB;  Service: Cardiovascular;  Laterality: Right;   LOWER EXTREMITY ANGIOGRAPHY Left 10/01/2020   Procedure: LOWER EXTREMITY ANGIOGRAPHY;  Surgeon: Marea Selinda RAMAN, MD;  Location: [facility] INVASIVE CV LAB;  Service: Cardiovascular;  Laterality: Left;   LOWER EXTREMITY INTERVENTION Right 09/07/2020   Procedure: LOWER EXTREMITY INTERVENTION;  Surgeon: Serene Gaile ORN, MD;  Location: [facility] INVASIVE CV LAB;  Service: Cardiovascular;  Laterality: Right;     reports that she has been smoking cigarettes. She has a 29 pack-year smoking history. She has never used smokeless tobacco. She reports  that she does not currently use alcohol. She reports that she does not use drugs.  Allergies[1]  Family History  Problem Relation Age of Onset   Breast cancer Mother 26   Diabetes Father    Congestive Heart Failure Father    Diabetes Brother    Breast cancer Maternal Aunt    Breast cancer Maternal Aunt        Maternal Great Aunt    Diabetes Paternal Aunt    Breast cancer Maternal Grandmother    Diabetes Maternal Grandfather    Breast cancer Cousin 31     Prior to Admission medications  Medication Sig Start Date End Date Taking? Authorizing Provider  albuterol  (VENTOLIN  HFA) 108 (90 Base) MCG/ACT inhaler Inhale 1-2 puffs into the lungs every 6 (six) hours as needed for wheezing or shortness of breath (cough). 07/18/23   Moishe Chiquita HERO, NP  apixaban  (ELIQUIS ) 5 MG TABS tablet Take 1 tablet (5 mg total) by mouth 2 (two) times daily. 09/29/20   Brown, Fallon E, NP  aspirin  EC 81 MG EC tablet Take 1 tablet (81 mg total) by mouth daily. Swallow whole. 10/02/20   Stegmayer, Suzen LABOR, PA-C  atorvastatin  (LIPITOR) 40 MG tablet TAKE 1 TABLET(40 MG) BY MOUTH DAILY 12/16/23   Simmons-Robinson, Rockie, MD  busPIRone  (BUSPAR ) 7.5 MG tablet Take 1 tablet (7.5 mg total) by mouth 2 (two) times daily. 01/06/24   Simmons-Robinson, Rockie, MD  citalopram  (CELEXA ) 40 MG tablet Take 1 tablet (40 mg total) by mouth daily. 11/17/23   Simmons-Robinson, Rockie, MD  ferrous gluconate  (FERGON) 324 MG tablet Take 1 tablet (324 mg total) by mouth daily with breakfast. 07/26/23   Franchot Novel, MD  fluticasone  (FLONASE ) 50 MCG/ACT nasal spray Place 2 sprays into both nostrils daily. 11/04/23   Simmons-Robinson, Rockie, MD  fluticasone  (FLOVENT  HFA) 44 MCG/ACT inhaler Inhale 1 puff into the lungs daily. 07/18/23   Moishe Chiquita HERO, NP  folic acid (FOLVITE) 400 MCG tablet Take 400 mcg by mouth daily.    [provider]  gabapentin  (NEURONTIN ) 100 MG capsule Take 1 capsule (100 mg total) by mouth at bedtime.  01/06/24   Simmons-Robinson, Rockie, MD  gabapentin  (NEURONTIN ) 100 MG capsule Take 1 capsule (100 mg total) by mouth 2 (two) times daily as needed. 01/06/24   Simmons-Robinson, Rockie, MD  linaclotide  (LINZESS ) 290 MCG CAPS capsule Take 1 capsule (290 mcg total) by mouth daily before breakfast. Patient taking differently: Take 290 mcg by mouth as needed. 11/23/22 01/04/24  [place], PA-C  methotrexate (RHEUMATREX) 10 MG tablet Take 60 mg by mouth once a week. Caution: Chemotherapy. Protect from light.    [provider]  midodrine  (PROAMATINE ) 5 MG tablet Take 1 tablet (5 mg total) by mouth 3 (three) times daily with meals. 03/05/24 03/05/25  Argentina Clap, MD  pantoprazole  (PROTONIX ) 40 MG tablet Take 1 tablet (40 mg total) by mouth daily. 01/16/24   Simmons-Robinson, Rockie, MD  Pirfenidone  267 MG CAPS Take 1 capsule by mouth in the morning, at noon, and at bedtime.    [provider]  predniSONE  (DELTASONE ) 2.5 MG tablet Take 2.5 mg by mouth daily with breakfast.    [provider]    Physical Exam: Vitals:  03/10/24 1055 03/10/24 1100 03/10/24 1115 03/10/24 1130  BP: (!) 99/53 (!) 81/52 (!) 92/53 (!) 84/55  Pulse: (!) 111 (!) 107 (!) 107 (!) 104  Resp:    16  Temp:      TempSrc:      SpO2: 95% 97% 95% 97%  Weight:      Height:        Constitutional: NAD, calm, comfortable Vitals:   03/10/24 1055 03/10/24 1100 03/10/24 1115 03/10/24 1130  BP: (!) 99/53 (!) 81/52 (!) 92/53 (!) 84/55  Pulse: (!) 111 (!) 107 (!) 107 (!) 104  Resp:    16  Temp:      TempSrc:      SpO2: 95% 97% 95% 97%  Weight:      Height:       Eyes: PERRL, lids and conjunctivae normal ENMT: Mucous membranes are moist. Posterior pharynx clear of any exudate or lesions.Normal dentition.  Neck: normal, supple, no masses, no thyromegaly Respiratory: clear to auscultation bilaterally, no wheezing, crackles on bilateral lower fields, increasing respiratory effort. No accessory  muscle use.  Cardiovascular: Regular rate and rhythm, no murmurs / rubs / gallops. No extremity edema. 2+ pedal pulses. No carotid bruits.  Abdomen: no tenderness, no masses palpated. No hepatosplenomegaly. Bowel sounds positive.  Musculoskeletal: no clubbing / cyanosis. No joint deformity upper and lower extremities. Good ROM, no contractures. Normal muscle tone.  Skin: no rashes, lesions, ulcers. No induration Neurologic: CN 2-12 grossly intact. Sensation intact, DTR normal. Strength 5/5 in all 4.  Psychiatric: Normal judgment and insight. Alert and oriented x 3. Normal mood.    Labs on Admission: I have personally reviewed following labs and imaging studies  CBC: Recent Labs  Lab 03/10/24 0819  WBC 13.6*  HGB 12.5  HCT 38.6  MCV 93.2  PLT 360   Basic Metabolic Panel: Recent Labs  Lab 03/10/24 0819  NA 142  K 3.5  CL 105  CO2 23  GLUCOSE 106*  BUN 15  CREATININE 0.52  CALCIUM  8.7*   GFR: Estimated Creatinine Clearance: 71.7 mL/min (by C-G formula based on SCr of 0.52 mg/dL). Liver Function Tests: Recent Labs  Lab 03/10/24 0819  AST 25  ALT 20  ALKPHOS 105  BILITOT 0.2  PROT 6.4*  ALBUMIN 4.1   No results for input(s): LIPASE, AMYLASE in the last 168 hours. No results for input(s): AMMONIA in the last 168 hours. Coagulation Profile: No results for input(s): INR, PROTIME in the last 168 hours. Cardiac Enzymes: No results for input(s): CKTOTAL, CKMB, CKMBINDEX, TROPONINI in the last 168 hours. BNP (last 3 results) No results for input(s): PROBNP in the last 8760 hours. HbA1C: No results for input(s): HGBA1C in the last 72 hours. CBG: No results for input(s): GLUCAP in the last 168 hours. Lipid Profile: No results for input(s): CHOL, HDL, LDLCALC, TRIG, CHOLHDL, LDLDIRECT in the last 72 hours. Thyroid  Function Tests: No results for input(s): TSH, T4TOTAL, FREET4, T3FREE, THYROIDAB in the last 72 hours. Anemia  Panel: No results for input(s): VITAMINB12, FOLATE, FERRITIN, TIBC, IRON , RETICCTPCT in the last 72 hours. Urine analysis:    Component Value Date/Time   COLORURINE YELLOW (A) 03/10/2024 1005   APPEARANCEUR HAZY (A) 03/10/2024 1005   APPEARANCEUR Clear 09/17/2013 1229   LABSPEC 1.021 03/10/2024 1005   LABSPEC 1.008 09/17/2013 1229   PHURINE 5.0 03/10/2024 1005   GLUCOSEU NEGATIVE 03/10/2024 1005   GLUCOSEU Negative 09/17/2013 1229   HGBUR SMALL (A) 03/10/2024 1005   BILIRUBINUR  NEGATIVE 03/10/2024 1005   BILIRUBINUR Negative 09/17/2013 1229   KETONESUR NEGATIVE 03/10/2024 1005   PROTEINUR NEGATIVE 03/10/2024 1005   NITRITE NEGATIVE 03/10/2024 1005   LEUKOCYTESUR NEGATIVE 03/10/2024 1005   LEUKOCYTESUR Negative 09/17/2013 1229    Radiological Exams on Admission: DG Chest 2 View Result Date: 03/10/2024 CLINICAL DATA:  Shortness of breath. EXAM: CHEST - 2 VIEW COMPARISON:  10/12/2023 FINDINGS: Similar appearing of basilar predominant chronic interstitial changes. No dense focal airspace consolidation or substantial pleural effusion. The cardio pericardial silhouette is enlarged. No acute bony abnormality. IMPRESSION: Chronic interstitial changes without acute cardiopulmonary findings. Electronically Signed   By: Camellia Candle M.D.   On: 03/10/2024 10:19    EKG: Independently reviewed.  Sinus tachycardia, no acute ST changes.  Assessment/Plan Principal Problem:   Sepsis (HCC)  (please populate well all problems here in Problem List. (For example, if patient is on BP meds at home and you resume or decide to hold them, it is a problem that needs to be her. Same for CAD, COPD, HLD and so on)  Sepsis without acute organ damage - Sepsis as evidenced by fever, tachycardia tachypneic, elevated lactic acid, source of infection currently is unknown.  clinically suspect to be atypical pneumonia. - Received 3 L of IV fluid bolus and currently appears to be euvolemic.  Hold off  further IV fluid bolus. - Continue CAP coverage with ceftriaxone  and azithromycin .  Check atypical pneumonia study Legionella and mycoplasma.  Check respiratory viral panel. - Other DDx, continue to look for other source of infection, blood culture sent, UA is pending to rule out UTI.  CAP, bacterial - Clinical suspect atypical pneumonia.  Continue coverage of ceftriaxone  and azithromycin . - Breathing treatment - Other Dx, low suspicion for rheumatoid lung at this point as patient's x-ray appeared to be stable compared to previous chest x-ray and RA appears to be fairly controlled.  ANCA associated vasculitis Pulmonary fibrosis RA on DMARDs - Appears to be stable - Continue prednisone   Orthostatic hypotension - Continue midodrine   History of aortic thrombosis status post thrombectomy Continue aspirin  and Eliquis   DVT prophylaxis: Eliquis  Code Status: Full code Family Communication: None at bedside Disposition Plan: Patient sick with sepsis with unknown source, requiring further IV antibiotics, expect more than 2 midnight hospital stay. Consults called: None Admission status: Telemetry admission   Cort ONEIDA Mana MD [facility] Pager [phone]  03/10/2024, 11:58 AM        [1] No Known Allergies  "

## 2024-03-10 NOTE — ED Notes (Signed)
 Called CCMD to add pt to monitoring.

## 2024-03-11 DIAGNOSIS — F32A Depression, unspecified: Secondary | ICD-10-CM | POA: Diagnosis not present

## 2024-03-11 DIAGNOSIS — J189 Pneumonia, unspecified organism: Secondary | ICD-10-CM | POA: Diagnosis not present

## 2024-03-11 DIAGNOSIS — I7410 Embolism and thrombosis of unspecified parts of aorta: Secondary | ICD-10-CM

## 2024-03-11 DIAGNOSIS — M313 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: Secondary | ICD-10-CM

## 2024-03-11 DIAGNOSIS — I959 Hypotension, unspecified: Secondary | ICD-10-CM

## 2024-03-11 DIAGNOSIS — A419 Sepsis, unspecified organism: Secondary | ICD-10-CM | POA: Diagnosis not present

## 2024-03-11 LAB — CBC
HCT: 34.8 % — ABNORMAL LOW (ref 36.0–46.0)
Hemoglobin: 11.1 g/dL — ABNORMAL LOW (ref 12.0–15.0)
MCH: 29.8 pg (ref 26.0–34.0)
MCHC: 31.9 g/dL (ref 30.0–36.0)
MCV: 93.5 fL (ref 80.0–100.0)
Platelets: 320 K/uL (ref 150–400)
RBC: 3.72 MIL/uL — ABNORMAL LOW (ref 3.87–5.11)
RDW: 15.9 % — ABNORMAL HIGH (ref 11.5–15.5)
WBC: 17.5 K/uL — ABNORMAL HIGH (ref 4.0–10.5)
nRBC: 0 % (ref 0.0–0.2)

## 2024-03-11 LAB — LACTIC ACID, PLASMA
Lactic Acid, Venous: 1.7 mmol/L (ref 0.5–1.9)
Lactic Acid, Venous: 2.3 mmol/L (ref 0.5–1.9)

## 2024-03-11 MED ORDER — IPRATROPIUM-ALBUTEROL 0.5-2.5 (3) MG/3ML IN SOLN
3.0000 mL | Freq: Four times a day (QID) | RESPIRATORY_TRACT | Status: DC
  Administered 2024-03-11 – 2024-03-12 (×4): 3 mL via RESPIRATORY_TRACT
  Filled 2024-03-11 (×4): qty 3

## 2024-03-11 MED ORDER — LACTATED RINGERS IV SOLN: INTRAVENOUS | Status: AC

## 2024-03-11 MED ORDER — DM-GUAIFENESIN ER 30-600 MG PO TB12
1.0000 | ORAL_TABLET | Freq: Two times a day (BID) | ORAL | Status: DC
  Administered 2024-03-11 – 2024-03-13 (×5): 1 via ORAL
  Filled 2024-03-11 (×2): qty 1

## 2024-03-11 MED ORDER — KETOROLAC TROMETHAMINE 30 MG/ML IJ SOLN
30.0000 mg | Freq: Once | INTRAMUSCULAR | Status: AC
  Administered 2024-03-11: 30 mg via INTRAVENOUS
  Filled 2024-03-11: qty 1

## 2024-03-11 NOTE — Assessment & Plan Note (Signed)
 Continue home midodrine

## 2024-03-11 NOTE — Hospital Course (Addendum)
 Partly taken from H&P.  Alexandra [name] is a 53 y.o. female with medical history significant of ANCA vasculitis, granulomatosis, rheumatoid lungs, on DMARDs, with chronic steroid, PAD status post right BKA, status post aortic thrombosis and thrombectomy in 2022, orthostatic hypotension on midodrine , erosive gastritis, presented with new onset of fever, chills, muscle aching, shortness of breath, muscle aching.  Patient also has some nausea and vomiting.  On presentation febrile at 101.8, tachycardic, saturating well on room air.  Labs with leukocytosis at 13.6, lactic acid 3.1>> 2.5, chest x-ray with similar bilateral lower lung interstitial changes.  Respiratory panel negative for COVID, flu and RSV.  RVP negative.  Patient received total of 3 L of IV bolus and started on ceftriaxone  and Zithromax .  12/28: Afebrile with stable vitals on room air this morning, worsening leukocytosis at 17.5, patient received Solu-Medrol  in the ED.  Preliminary blood cultures negative, group A strep PCR negative, mycoplasma pneumoniae and Legionella pending. Improving lactic acidosis.  Procalcitonin at 0.42  12/29: Patient was febrile earlier in the morning.   Broadening antibiotics to cefepime .  12/30: Remained afebrile and improving cough and congestion.  Patient was given migraine cocktail due to headache and need to follow-up with primary care provider for further assistance.  Patient is being discharged on 5 days of Levaquin , will get benefit from a repeat chest imaging in 4 to 6 weeks by PCP.  She was also given some DuoNeb nebulizer treatment to use as needed at home.  She was provided with some Mucinex  and supportive care.  She will continue on current medications and need to have a close follow-up with her providers for further assistance.

## 2024-03-11 NOTE — Progress Notes (Signed)
 Lab called with critical lactic 2.3. Dr. Caleen notified.

## 2024-03-11 NOTE — Assessment & Plan Note (Signed)
 Patient has history of aortic thrombosis s/p thrombectomy. - Continue home aspirin  and Eliquis 

## 2024-03-11 NOTE — Progress Notes (Signed)
 " Progress Note   Patient: Alexandra [name] FMW:[id number] DOB: 11/24/1970 DOA: 03/10/2024     1 DOS: the patient was seen and examined on 03/11/2024   Brief hospital course: Partly taken from H&P.  Alexandra [name] is a 53 y.o. female with medical history significant of ANCA vasculitis, granulomatosis, rheumatoid lungs, on DMARDs, with chronic steroid, PAD status post right BKA, status post aortic thrombosis and thrombectomy in 2022, orthostatic hypotension on midodrine , erosive gastritis, presented with new onset of fever, chills, muscle aching, shortness of breath, muscle aching.  Patient also has some nausea and vomiting.  On presentation febrile at 101.8, tachycardic, saturating well on room air.  Labs with leukocytosis at 13.6, lactic acid 3.1>> 2.5, chest x-ray with similar bilateral lower lung interstitial changes.  Respiratory panel negative for COVID, flu and RSV.  RVP negative.  Patient received total of 3 L of IV bolus and started on ceftriaxone  and Zithromax .  12/28: Afebrile with stable vitals on room air this morning, worsening leukocytosis at 17.5, patient received Solu-Medrol  in the ED.  Preliminary blood cultures negative, group A strep PCR negative, mycoplasma pneumoniae and Legionella pending. Improving lactic acidosis.  Procalcitonin at 0.42  Assessment and Plan: * Sepsis due to pneumonia [facility]) Patient met sepsis criteria with fever, tachycardia, tachypnea and leukocytosis, secondary to bilateral multifocal pneumonia likely atypical. Met severe sepsis criteria with lactic acidosis which seems improving now. Slight worsening of leukocytosis but patient also received Solu-Medrol . Procalcitonin elevated 0.42.  Strep pneumo negative with pending mycoplasma pneumonia.  Preliminary blood cultures negative. -Continue ceftriaxone  and Zithromax  -Continue supportive care -Giving some more IV fluid -Monitor lactic acid -Follow-up sputum and mycoplasma pneumonia labs  Wegener's  granulomatosis without renal involvement (HCC) Patient has an history of ANCA associated vasculitis and pulmonary fibrosis. Also has history of RA on DMARDs - Continue home prednisone  -Continue outpatient follow-up and management  Aortic embolism or thrombosis [facility]) Patient has history of aortic thrombosis s/p thrombectomy. - Continue home aspirin  and Eliquis   Hypotension - Continue home midodrine   Depression - Continue home Celexa    Subjective: Patient was seen and examined today.  Complaining of headache and Tylenol  was not working.  Also having feeling of chest tightness with cough, stating that she cannot take a deep breath.  Physical Exam: Vitals:   03/11/24 0423 03/11/24 0833 03/11/24 1126 03/11/24 1336  BP: 131/78 125/69 (!) 124/55   Pulse: (!) 53 (!) 56 (!) 54   Resp: 18 19 18    Temp: 98.3 F (36.8 C) 98.4 F (36.9 C)    TempSrc:  Oral    SpO2: 98% 96% 96% 96%  Weight:      Height:       General.  Well-developed lady, in no acute distress. Pulmonary.  Lungs clear bilaterally, normal respiratory effort. CV.  Regular rate and rhythm, no JVD, rub or murmur. Abdomen.  Soft, nontender, nondistended, BS positive. CNS.  Alert and oriented .  No focal neurologic deficit. Extremities.  Right BKA, no edema on left Psychiatry.  Judgment and insight appears normal.   Data Reviewed: Prior data reviewed  Family Communication: Discussed with patient  Disposition: Status is: Inpatient Remains inpatient appropriate because: Severity of illness  Planned Discharge Destination: Home  DVT prophylaxis.  Eliquis  Time spent: 50 minutes  This record has been created using Conservation officer, historic buildings. Errors have been sought and corrected,but may not always be located. Such creation errors do not reflect on the standard of care.   Author: Dezaree Tracey  Caleen, MD 03/11/2024 3:11 PM  For on call review [email or web address].  "

## 2024-03-11 NOTE — Assessment & Plan Note (Signed)
 Patient met sepsis criteria with fever, tachycardia, tachypnea and leukocytosis, secondary to bilateral multifocal pneumonia likely atypical. Met severe sepsis criteria with lactic acidosis which seems improving now. Slight worsening of leukocytosis but patient also received Solu-Medrol . Procalcitonin elevated 0.42.  Strep pneumo negative with pending mycoplasma pneumonia.  Preliminary blood cultures negative. -Continue ceftriaxone  and Zithromax  -Continue supportive care -Giving some more IV fluid -Monitor lactic acid -Follow-up sputum and mycoplasma pneumonia labs

## 2024-03-11 NOTE — Assessment & Plan Note (Signed)
 Patient has an history of ANCA associated vasculitis and pulmonary fibrosis. Also has history of RA on DMARDs - Continue home prednisone  -Continue outpatient follow-up and management

## 2024-03-11 NOTE — Assessment & Plan Note (Signed)
-   Continue home Celexa 

## 2024-03-11 NOTE — Plan of Care (Signed)
  Problem: Clinical Measurements: Goal: Respiratory complications will improve Outcome: Progressing Goal: Cardiovascular complication will be avoided Outcome: Progressing   Problem: Activity: Goal: Risk for activity intolerance will decrease Outcome: Progressing   Problem: Nutrition: Goal: Adequate nutrition will be maintained Outcome: Progressing   Problem: Coping: Goal: Level of anxiety will decrease Outcome: Progressing   Problem: Elimination: Goal: Will not experience complications related to bowel motility Outcome: Progressing Goal: Will not experience complications related to urinary retention Outcome: Progressing   Problem: Pain Managment: Goal: General experience of comfort will improve and/or be controlled Outcome: Progressing   Problem: Safety: Goal: Ability to remain free from injury will improve Outcome: Progressing

## 2024-03-11 NOTE — Plan of Care (Signed)

## 2024-03-12 DIAGNOSIS — I959 Hypotension, unspecified: Secondary | ICD-10-CM | POA: Diagnosis not present

## 2024-03-12 DIAGNOSIS — J189 Pneumonia, unspecified organism: Secondary | ICD-10-CM | POA: Diagnosis not present

## 2024-03-12 DIAGNOSIS — M313 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: Secondary | ICD-10-CM | POA: Diagnosis not present

## 2024-03-12 DIAGNOSIS — I7410 Embolism and thrombosis of unspecified parts of aorta: Secondary | ICD-10-CM | POA: Diagnosis not present

## 2024-03-12 LAB — CBC
HCT: 34.8 % — ABNORMAL LOW (ref 36.0–46.0)
Hemoglobin: 11.1 g/dL — ABNORMAL LOW (ref 12.0–15.0)
MCH: 29.8 pg (ref 26.0–34.0)
MCHC: 31.9 g/dL (ref 30.0–36.0)
MCV: 93.5 fL (ref 80.0–100.0)
Platelets: 309 K/uL (ref 150–400)
RBC: 3.72 MIL/uL — ABNORMAL LOW (ref 3.87–5.11)
RDW: 16 % — ABNORMAL HIGH (ref 11.5–15.5)
WBC: 14.6 K/uL — ABNORMAL HIGH (ref 4.0–10.5)
nRBC: 0 % (ref 0.0–0.2)

## 2024-03-12 LAB — BASIC METABOLIC PANEL WITH GFR
Anion gap: 12 (ref 5–15)
BUN: 9 mg/dL (ref 6–20)
CO2: 28 mmol/L (ref 22–32)
Calcium: 8.4 mg/dL — ABNORMAL LOW (ref 8.9–10.3)
Chloride: 104 mmol/L (ref 98–111)
Creatinine, Ser: 0.48 mg/dL (ref 0.44–1.00)
GFR, Estimated: >60 mL/min
Glucose, Bld: 88 mg/dL (ref 70–99)
Potassium: 3.5 mmol/L (ref 3.5–5.1)
Sodium: 144 mmol/L (ref 135–145)

## 2024-03-12 LAB — LACTIC ACID, PLASMA: Lactic Acid, Venous: 1.6 mmol/L (ref 0.5–1.9)

## 2024-03-12 MED ORDER — IPRATROPIUM-ALBUTEROL 0.5-2.5 (3) MG/3ML IN SOLN
3.0000 mL | Freq: Two times a day (BID) | RESPIRATORY_TRACT | Status: DC
  Administered 2024-03-13: 3 mL via RESPIRATORY_TRACT
  Filled 2024-03-12 (×2): qty 3

## 2024-03-12 MED ORDER — SODIUM CHLORIDE 0.9 % IV SOLN
2.0000 g | Freq: Three times a day (TID) | INTRAVENOUS | Status: DC
  Administered 2024-03-12 – 2024-03-13 (×4): 2 g via INTRAVENOUS
  Filled 2024-03-12 (×5): qty 12.5

## 2024-03-12 NOTE — Progress Notes (Signed)
 Pharmacy Antibiotic Note  Alexandra [name] is a 53 y.o. female admitted on 03/10/2024 with pneumonia.  Pharmacy has been consulted for Cefepime  dosing.  -Broadening abx from Ceftriaxone  to Cefepime  as patient still afebrile and current cultures/respiratory panels are negative. -also on Azithromycin   Plan: Will order cefepime  2 gm IV q8h  Continue to assess renal fxn, cultures and length of therapy  Height: 5' 2 (157.5 cm) Weight: 70 kg (154 lb 5.2 oz) IBW/kg (Calculated) : 50.1  Temp (24hrs), Avg:99 F (37.2 C), Min:98.2 F (36.8 C), Max:100.1 F (37.8 C)  Recent Labs  Lab 03/10/24 0819 03/10/24 1043 03/10/24 1309 03/11/24 0507 03/11/24 0908 03/11/24 1103  WBC 13.6*  --   --  17.5*  --   --   CREATININE 0.52  --   --   --   --   --   LATICACIDVEN 3.1* 2.2* 3.9*  --  1.7 2.3*    Estimated Creatinine Clearance: 73.7 mL/min (by C-G formula based on SCr of 0.52 mg/dL).    Allergies[1]  Antimicrobials this admission: Ceftriaxone  12/27>> 12/29 Azith 12/27>> Cefepime  12/29>>  Dose adjustments this admission:    Microbiology results: 12/27 BCx: NGTD   UCx:      Sputum:    12/27 MRSA PCR: negative 12/27 Respiratory panels: negative  Thank you for allowing pharmacy to be a part of this patients care.  Allean Haas PharmD Clinical Pharmacist 03/12/2024      [1] No Known Allergies

## 2024-03-12 NOTE — Assessment & Plan Note (Signed)
 Patient met sepsis criteria with fever, tachycardia, tachypnea and leukocytosis, secondary to bilateral multifocal pneumonia likely atypical. Met severe sepsis criteria with lactic acidosis which seems improving now. Slight worsening of leukocytosis but patient also received Solu-Medrol . Procalcitonin elevated 0.42.  Strep pneumo negative with pending mycoplasma pneumonia.  Preliminary blood cultures negative. -Switching ceftriaxone  with cefepime  -Continue with Zithromax  -Continue supportive care -Follow-up sputum and mycoplasma pneumonia labs

## 2024-03-12 NOTE — Progress Notes (Addendum)
 " Progress Note   Patient: Alexandra [name] FMW:[id number] DOB: 1971/02/27 DOA: 03/10/2024     2 DOS: the patient was seen and examined on 03/12/2024   Brief hospital course: Partly taken from H&P.  Alexandra [name] is a 53 y.o. female with medical history significant of ANCA vasculitis, granulomatosis, rheumatoid lungs, on DMARDs, with chronic steroid, PAD status post right BKA, status post aortic thrombosis and thrombectomy in 2022, orthostatic hypotension on midodrine , erosive gastritis, presented with new onset of fever, chills, muscle aching, shortness of breath, muscle aching.  Patient also has some nausea and vomiting.  On presentation febrile at 101.8, tachycardic, saturating well on room air.  Labs with leukocytosis at 13.6, lactic acid 3.1>> 2.5, chest x-ray with similar bilateral lower lung interstitial changes.  Respiratory panel negative for COVID, flu and RSV.  RVP negative.  Patient received total of 3 L of IV bolus and started on ceftriaxone  and Zithromax .  12/28: Afebrile with stable vitals on room air this morning, worsening leukocytosis at 17.5, patient received Solu-Medrol  in the ED.  Preliminary blood cultures negative, group A strep PCR negative, mycoplasma pneumoniae and Legionella pending. Improving lactic acidosis.  Procalcitonin at 0.42  12/29: Patient was febrile earlier in the morning.   Broadening antibiotics to cefepime .  Assessment and Plan: * Sepsis due to pneumonia [facility]) Patient met sepsis criteria with fever, tachycardia, tachypnea and leukocytosis, secondary to bilateral multifocal pneumonia likely atypical. Met severe sepsis criteria with lactic acidosis which seems improving now. Slight worsening of leukocytosis but patient also received Solu-Medrol . Procalcitonin elevated 0.42.  Strep pneumo negative with pending mycoplasma pneumonia.  Preliminary blood cultures negative. 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 Also has history of RA on DMARDs - Continue home prednisone  -Continue outpatient follow-up and management  Aortic embolism or thrombosis [facility]) Patient has history of aortic thrombosis s/p thrombectomy. - Continue home aspirin  and Eliquis   Hypotension - Continue home midodrine   Depression - Continue home Celexa    Subjective: Patient was complaining of some headache, shoulder and neck pain.  Still had fever earlier.  Cough seems improving  Physical Exam: Vitals:   03/12/24 0723 03/12/24 0739 03/12/24 0745 03/12/24 1535  BP:   103/60 (!) 105/91  Pulse:   (!) 102 64  Resp:   18 17  Temp:  100.1 F (37.8 C) 99.5 F (37.5 C) 98.5 F (36.9 C)  TempSrc:   Oral Oral  SpO2: 93%  96% 97%  Weight:      Height:       General.  Well-developed lady, in no acute distress. Pulmonary.  Lungs clear bilaterally, normal respiratory effort. CV.  Regular rate and rhythm, no JVD, rub or murmur. Abdomen.  Soft, nontender, nondistended, BS positive. CNS.  Alert and oriented .  No focal neurologic deficit. Extremities.  Right BKA Psychiatry.  Judgment and insight appears normal.   Data Reviewed: Prior data reviewed  Family Communication: Discussed with patient  Disposition: Status is: Inpatient Remains inpatient appropriate because: Severity of illness  Planned Discharge Destination: Home  DVT prophylaxis.  Eliquis  Time spent: 50 minutes  This record has been created using Conservation officer, historic buildings. Errors have been sought and corrected,but may not always be located. Such creation errors do not reflect on the standard of care.   Author: Amaryllis Dare, MD 03/12/2024 6:02 PM  For on call review [email or web address].  "

## 2024-03-12 NOTE — Plan of Care (Signed)

## 2024-03-12 NOTE — TOC CM/SW Note (Signed)
 Transition of Care [facility]) - Inpatient Brief Assessment   Patient Details  Name: Alexandra [name] MRN: [id number] Date of Birth: February 11, 1971  Transition of Care [facility]) CM/SW Contact:    Grayce JAYSON Perfect, RN Phone Number: 03/12/2024, 10:38 AM   Clinical Narrative:  Transition of Care Department [facility]) has reviewed patient and no TOC needs have been identified at this time.  If new patient transition needs arise, please place a TOC consult.   Transition of Care Asessment: Insurance and Status: Insurance coverage has been reviewed Patient has primary care physician: Yes Home environment has been reviewed: single family home Prior level of function:: not documented Prior/Current Home Services: No current home services Social Drivers of Health Review: SDOH reviewed no interventions necessary Readmission risk has been reviewed: Yes Transition of care needs: no transition of care needs at this time

## 2024-03-12 NOTE — Care Management Important Message (Signed)
 Important Message  Patient Details  Name: Alexandra [name] MRN: [id number] Date of Birth: [birth year]   Important Message Given:  Yes - Medicare IM     Jamarl Pew W, CMA 03/12/2024, 1:01 PM

## 2024-03-13 ENCOUNTER — Other Ambulatory Visit: Payer: Self-pay

## 2024-03-13 ENCOUNTER — Encounter: Payer: Self-pay | Admitting: Oncology

## 2024-03-13 DIAGNOSIS — B9689 Other specified bacterial agents as the cause of diseases classified elsewhere: Secondary | ICD-10-CM

## 2024-03-13 LAB — MYCOPLASMA PNEUMONIAE ANTIBODY, IGM: Mycoplasma pneumo IgM: <770 U/mL (ref 0–769)

## 2024-03-13 MED ORDER — LEVOFLOXACIN 750 MG PO TABS
750.0000 mg | ORAL_TABLET | Freq: Every day | ORAL | 0 refills | Status: AC
  Filled 2024-03-13: qty 5, 5d supply, fill #0

## 2024-03-13 MED ORDER — KETOROLAC TROMETHAMINE 30 MG/ML IJ SOLN
30.0000 mg | Freq: Once | INTRAMUSCULAR | Status: AC
  Administered 2024-03-13: 30 mg via INTRAVENOUS
  Filled 2024-03-13: qty 1

## 2024-03-13 MED ORDER — DM-GUAIFENESIN ER 30-600 MG PO TB12
1.0000 | ORAL_TABLET | Freq: Two times a day (BID) | ORAL | 0 refills | Status: AC | PRN
  Filled 2024-03-13: qty 30, 15d supply, fill #0

## 2024-03-13 MED ORDER — IPRATROPIUM-ALBUTEROL 0.5-2.5 (3) MG/3ML IN SOLN
3.0000 mL | Freq: Four times a day (QID) | RESPIRATORY_TRACT | 1 refills | Status: AC | PRN
  Filled 2024-03-13: qty 360, 30d supply, fill #0

## 2024-03-13 MED ORDER — LEVOFLOXACIN 750 MG PO TABS: 750.0000 mg | ORAL_TABLET | Freq: Every day | ORAL | Status: DC

## 2024-03-13 MED ORDER — DIPHENHYDRAMINE HCL 50 MG/ML IJ SOLN
12.5000 mg | Freq: Once | INTRAMUSCULAR | Status: AC
  Administered 2024-03-13: 12.5 mg via INTRAVENOUS
  Filled 2024-03-13: qty 1

## 2024-03-13 MED ORDER — LEVOFLOXACIN 750 MG PO TABS
750.0000 mg | ORAL_TABLET | Freq: Every day | ORAL | Status: DC
  Administered 2024-03-13: 750 mg via ORAL
  Filled 2024-03-13: qty 1

## 2024-03-13 MED ORDER — PROCHLORPERAZINE EDISYLATE 10 MG/2ML IJ SOLN
10.0000 mg | Freq: Once | INTRAMUSCULAR | Status: AC
  Administered 2024-03-13: 10 mg via INTRAVENOUS
  Filled 2024-03-13: qty 2

## 2024-03-13 NOTE — Plan of Care (Signed)
  Problem: Education: Goal: Knowledge of General Education information will improve Description: Including pain rating scale, medication(s)/side effects and non-pharmacologic comfort measures Outcome: Progressing   Problem: Health Behavior/Discharge Planning: Goal: Ability to manage health-related needs will improve Outcome: Progressing   Problem: Activity: Goal: Risk for activity intolerance will decrease Outcome: Progressing   Problem: Nutrition: Goal: Adequate nutrition will be maintained Outcome: Progressing   Problem: Coping: Goal: Level of anxiety will decrease Outcome: Progressing   Problem: Elimination: Goal: Will not experience complications related to bowel motility Outcome: Progressing   Problem: Pain Managment: Goal: General experience of comfort will improve and/or be controlled Outcome: Progressing   Problem: Safety: Goal: Ability to remain free from injury will improve Outcome: Progressing

## 2024-03-13 NOTE — Discharge Summary (Signed)
 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  Alexandra [name] is a 53 y.o. female with medical history significant of ANCA vasculitis, granulomatosis, rheumatoid lungs, on DMARDs, with chronic steroid, PAD status post right BKA, status post aortic thrombosis and thrombectomy in 2022, orthostatic hypotension on midodrine , erosive gastritis, presented with new onset of fever, chills, muscle aching, shortness of breath, muscle aching.  Patient also has some nausea and vomiting.  On presentation febrile at 101.8, tachycardic, saturating well on room air.  Labs with leukocytosis at 13.6, lactic acid 3.1>> 2.5, chest x-ray with similar bilateral lower lung interstitial changes.  Respiratory panel negative for COVID, flu and RSV.  RVP negative.  Patient received total of 3 L of IV bolus and started on ceftriaxone  and Zithromax .  12/28: Afebrile with stable vitals on room air this morning, worsening leukocytosis at 17.5, patient received Solu-Medrol  in the ED.  Preliminary blood cultures negative, group A strep PCR negative, mycoplasma pneumoniae and Legionella pending. Improving lactic acidosis.  Procalcitonin at 0.42  12/29: Patient was febrile earlier in the morning.   Broadening antibiotics to cefepime .  12/30: Remained afebrile and improving cough  and congestion.  Patient was given migraine cocktail due to headache and need to follow-up with primary care provider for further assistance.  Patient is being discharged on 5 days of Levaquin , will get benefit from a repeat chest imaging in 4 to 6 weeks by PCP.  She was also given some DuoNeb nebulizer treatment to use as needed at home.  She was provided with some Mucinex  and supportive care.  She will continue on current medications and need to have a close follow-up with her providers for further assistance.  Assessment and Plan: * Sepsis due to pneumonia [facility]) Patient met sepsis criteria with fever, tachycardia, tachypnea and leukocytosis, secondary to bilateral multifocal pneumonia likely atypical. Met severe sepsis criteria with lactic acidosis which seems improving now. Procalcitonin elevated 0.42.  Strep pneumo negative with pending mycoplasma pneumonia.  Preliminary blood cultures negative. Patient initially received ceftriaxone  which was broadened to cefepime  later on due to persistent fever.  Responded well and is being discharged on Levaquin .  She also received Zithromax  while in the hospital. 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 Also has history of RA on DMARDs - Continue home prednisone  -Continue outpatient follow-up and management  Aortic embolism or thrombosis [facility]) Patient has history of aortic thrombosis s/p thrombectomy. - Continue home aspirin  and Eliquis   Hypotension - Continue home midodrine   Depression - Continue home Celexa   Consultants: None Procedures performed: None Disposition: Home Diet recommendation:  Regular diet DISCHARGE MEDICATION: Allergies as of 03/13/2024   No Known Allergies      Medication List     STOP taking these medications  busPIRone  7.5 MG tablet Commonly known as: BUSPAR     fluticasone  44 MCG/ACT inhaler Commonly known as: FLOVENT  HFA       TAKE these medications    albuterol  108 (90 Base) MCG/ACT inhaler Commonly known as: VENTOLIN  HFA Inhale 1-2 puffs into the lungs every 6 (six) hours as needed for wheezing or shortness of breath (cough).   apixaban  5 MG Tabs tablet Commonly known as: ELIQUIS  Take 1 tablet (5 mg total) by mouth 2 (two) times daily.   aspirin  EC 81 MG tablet Take 1 tablet (81 mg total) by mouth daily. Swallow whole.   atorvastatin  40 MG tablet Commonly known as: LIPITOR TAKE 1 TABLET(40 MG) BY MOUTH DAILY   citalopram  40 MG tablet Commonly known as: CeleXA  Take 1 tablet (40 mg total) by mouth daily.   dextromethorphan -guaiFENesin  30-600 MG 12hr tablet Commonly known as: MUCINEX  DM Take 1 tablet by mouth 2 (two) times daily as needed for cough.   ferrous gluconate  324 MG tablet Commonly known as: FERGON Take 1 tablet (324 mg total) by mouth daily with breakfast.   fluticasone  50 MCG/ACT nasal spray Commonly known as: FLONASE  Place 2 sprays into both nostrils daily.   folic acid 400 MCG tablet Commonly known as: FOLVITE Take 400 mcg by mouth daily.   gabapentin  100 MG capsule Commonly known as: NEURONTIN  Take 1 capsule (100 mg total) by mouth 2 (two) times daily as needed.   ipratropium-albuterol  0.5-2.5 (3) MG/3ML Soln Commonly known as: DUONEB Take 3 mLs by nebulization every 6 (six) hours as needed.   levofloxacin  750 MG tablet Commonly known as: LEVAQUIN  Take 1 tablet (750 mg total) by mouth daily for 5 days. Notes to patient:   >>>   Avoid dairy, calcium , iron , multivitamins, magnesium  within 2 hours of Levaquin  >>   linaclotide  290 MCG Caps capsule Commonly known as: Linzess  Take 1 capsule (290 mcg total) by mouth daily before breakfast. What changed:  when to take this reasons to take this   methotrexate 10 MG tablet Commonly known as: RHEUMATREX Take 60 mg by mouth once a week. Caution:  Chemotherapy. Protect from light.   midodrine  5 MG tablet Commonly known as: PROAMATINE  Take 1 tablet (5 mg total) by mouth 3 (three) times daily with meals.   pantoprazole  40 MG tablet Commonly known as: PROTONIX  Take 1 tablet (40 mg total) by mouth daily. What changed: when to take this   Pirfenidone  267 MG Caps Take 1 capsule by mouth in the morning, at noon, and at bedtime.   predniSONE  2.5 MG tablet Commonly known as: DELTASONE  Take 2.5 mg by mouth daily with breakfast.        Follow-up Information     Simmons-Robinson, Makiera, MD Follow up.   Specialty: Family Medicine Why: hospital follow up Contact information: [address] [place] [address] [phone]                Discharge Exam: Alexandra [name]   03/10/24 0813 03/10/24 1238  Weight: 66.2 kg 70 kg   General.  Well-developed lady, in no acute distress. Pulmonary.  Lungs clear bilaterally, normal respiratory effort. CV.  Regular rate and rhythm, no JVD, rub or murmur. Abdomen.  Soft, nontender, nondistended, BS positive. CNS.  Alert and oriented .  No focal neurologic deficit. Extremities.  Right BKA Psychiatry.  Judgment and insight appears normal.   Condition at discharge: stable  The results of significant diagnostics from this hospitalization (including imaging, microbiology, ancillary and laboratory)  are listed below for reference.   Imaging Studies: DG Chest 2 View Result Date: 03/10/2024 CLINICAL DATA:  Shortness of breath. EXAM: CHEST - 2 VIEW COMPARISON:  10/12/2023 FINDINGS: Similar appearing of basilar predominant chronic interstitial changes. No dense focal airspace consolidation or substantial pleural effusion. The cardio pericardial silhouette is enlarged. No acute bony abnormality. IMPRESSION: Chronic interstitial changes without acute cardiopulmonary findings. Electronically Signed   By: Camellia Candle M.D.   On: 03/10/2024 10:19    Microbiology: Results  for orders placed or performed during the hospital encounter of 03/10/24  Resp panel by RT-PCR (RSV, Flu A&B, Covid) Anterior Nasal Swab     Status: None   Collection Time: 03/10/24  8:19 AM   Specimen: Anterior Nasal Swab  Result Value Ref Range Status   SARS Coronavirus 2 by RT PCR NEGATIVE NEGATIVE Final    Comment: (NOTE) SARS-CoV-2 target nucleic acids are NOT DETECTED.  The SARS-CoV-2 RNA is generally detectable in upper respiratory specimens during the acute phase of infection. The lowest concentration of SARS-CoV-2 viral copies this assay can detect is 138 copies/mL. A negative result does not preclude SARS-Cov-2 infection and should not be used as the sole basis for treatment or other patient management decisions. A negative result may occur with  improper specimen collection/handling, submission of specimen other than nasopharyngeal swab, presence of viral mutation(s) within the areas targeted by this assay, and inadequate number of viral copies(<138 copies/mL). A negative result must be combined with clinical observations, patient history, and epidemiological information. The expected result is Negative.  Fact Sheet for Patients:  bloggercourse.com  Fact Sheet for Healthcare Providers:  seriousbroker.it  This test is no t yet approved or cleared by the [place]  FDA and  has been authorized for detection and/or diagnosis of SARS-CoV-2 by FDA under an Emergency Use Authorization (EUA). This EUA will remain  in effect (meaning this test can be used) for the duration of the COVID-19 declaration under Section 564(b)(1) of the Act, 21 U.S.C.section 360bbb-3(b)(1), unless the authorization is terminated  or revoked sooner.       Influenza A by PCR NEGATIVE NEGATIVE Final   Influenza B by PCR NEGATIVE NEGATIVE Final    Comment: (NOTE) The Xpert Xpress SARS-CoV-2/FLU/RSV plus assay is intended as an aid in the diagnosis  of influenza from Nasopharyngeal swab specimens and should not be used as a sole basis for treatment. Nasal washings and aspirates are unacceptable for Xpert Xpress SARS-CoV-2/FLU/RSV testing.  Fact Sheet for Patients: bloggercourse.com  Fact Sheet for Healthcare Providers: seriousbroker.it  This test is not yet approved or cleared by the [place]  FDA and has been authorized for detection and/or diagnosis of SARS-CoV-2 by FDA under an Emergency Use Authorization (EUA). This EUA will remain in effect (meaning this test can be used) for the duration of the COVID-19 declaration under Section 564(b)(1) of the Act, 21 U.S.C. section 360bbb-3(b)(1), unless the authorization is terminated or revoked.     Resp Syncytial Virus by PCR NEGATIVE NEGATIVE Final    Comment: (NOTE) Fact Sheet for Patients: bloggercourse.com  Fact Sheet for Healthcare Providers: seriousbroker.it  This test is not yet approved or cleared by the [place]  FDA and has been authorized for detection and/or diagnosis of SARS-CoV-2 by FDA under an Emergency Use Authorization (EUA). This EUA will remain in effect (meaning this test can be used) for the duration of the COVID-19 declaration under Section 564(b)(1) of the Act, 21 U.S.C. section 360bbb-3(b)(1), unless the authorization is terminated  or revoked.  Performed at [facility], [address] [place], [place] [address]   Group A Strep by PCR [facility] Only)     Status: None   Collection Time: 03/10/24  8:19 AM   Specimen: Anterior Nasal Swab; Sterile Swab  Result Value Ref Range Status   Group A Strep by PCR NOT DETECTED NOT DETECTED Final    Comment: Performed at [facility], [address] [place], [place] [address]  Blood culture (routine x 2)     Status: None (Preliminary result)   Collection Time: 03/10/24  8:19 AM    Specimen: BLOOD  Result Value Ref Range Status   Specimen Description BLOOD LEFT ANTECUBITAL  Final   Special Requests   Final    BOTTLES DRAWN AEROBIC AND ANAEROBIC Blood Culture adequate volume   Culture   Final    NO GROWTH 3 DAYS Performed at [facility], [address] [place], [place] [address]    Report Status PENDING  Incomplete  Blood culture (routine x 2)     Status: None (Preliminary result)   Collection Time: 03/10/24 10:05 AM   Specimen: BLOOD  Result Value Ref Range Status   Specimen Description BLOOD BLOOD LEFT FOREARM  Final   Special Requests   Final    BOTTLES DRAWN AEROBIC AND ANAEROBIC Blood Culture results may not be optimal due to an inadequate volume of blood received in culture bottles   Culture   Final    NO GROWTH 3 DAYS Performed at [facility], [address] [place], [place] [address]    Report Status PENDING  Incomplete  Respiratory (~20 pathogens) panel by PCR     Status: None   Collection Time: 03/10/24 11:17 AM   Specimen: Nasopharyngeal Swab; Respiratory  Result Value Ref Range Status   Adenovirus NOT DETECTED NOT DETECTED Final   Coronavirus 229E NOT DETECTED NOT DETECTED Final    Comment: (NOTE) The Coronavirus on the Respiratory Panel, DOES NOT test for the novel  Coronavirus (2019 nCoV)    Coronavirus HKU1 NOT DETECTED NOT DETECTED Final   Coronavirus NL63 NOT DETECTED NOT DETECTED Final   Coronavirus OC43 NOT DETECTED NOT DETECTED Final   Metapneumovirus NOT DETECTED NOT DETECTED Final   Rhinovirus / Enterovirus NOT DETECTED NOT DETECTED Final   Influenza A NOT DETECTED NOT DETECTED Final   Influenza B NOT DETECTED NOT DETECTED Final   Parainfluenza Virus 1 NOT DETECTED NOT DETECTED Final   Parainfluenza Virus 2 NOT DETECTED NOT DETECTED Final   Parainfluenza Virus 3 NOT DETECTED NOT DETECTED Final   Parainfluenza Virus 4 NOT DETECTED NOT DETECTED Final   Respiratory Syncytial Virus NOT DETECTED NOT DETECTED  Final   Bordetella pertussis NOT DETECTED NOT DETECTED Final   Bordetella Parapertussis NOT DETECTED NOT DETECTED Final   Chlamydophila pneumoniae NOT DETECTED NOT DETECTED Final   Mycoplasma pneumoniae NOT DETECTED NOT DETECTED Final    Comment: Performed at [facility], [address] [place], [place] [address]  MRSA Next Gen by PCR, Nasal     Status: None   Collection Time: 03/10/24 11:17 AM   Specimen: Nasopharyngeal Swab; Nasal Swab  Result Value Ref Range Status   MRSA by PCR Next Gen NOT DETECTED NOT DETECTED Final    Comment: (NOTE) The GeneXpert MRSA Assay (FDA approved for NASAL specimens only), is one component of a comprehensive MRSA colonization surveillance program. It is not intended to diagnose MRSA infection nor to guide  or monitor treatment for MRSA infections. Test performance is not FDA approved in patients less than 40 years old. Performed at [facility], [address] [place], [place] [address]     Labs: CBC: Recent Labs  Lab 03/10/24 [id number] 03/11/24 0507 03/12/24 1209  WBC 13.6* 17.5* 14.6*  HGB 12.5 11.1* 11.1*  HCT 38.6 34.8* 34.8*  MCV 93.2 93.5 93.5  PLT 360 320 309   Basic Metabolic Panel: Recent Labs  Lab 03/10/24 0819 03/12/24 1209  NA 142 144  K 3.5 3.5  CL 105 104  CO2 23 28  GLUCOSE 106* 88  BUN 15 9  CREATININE 0.52 0.48  CALCIUM  8.7* 8.4*   Liver Function Tests: Recent Labs  Lab 03/10/24 0819  AST 25  ALT 20  ALKPHOS 105  BILITOT 0.2  PROT 6.4*  ALBUMIN 4.1   CBG: No results for input(s): GLUCAP in the last 168 hours.  Discharge time spent: greater than 30 minutes.  This record has been created using Conservation officer, historic buildings. Errors have been sought and corrected,but may not always be located. Such creation errors do not reflect on the standard of care.   Signed: Amaryllis Dare, MD [facility] 03/13/2024 "

## 2024-03-14 ENCOUNTER — Telehealth: Payer: Self-pay

## 2024-03-14 LAB — LEGIONELLA PNEUMOPHILA SEROGP 1 UR AG: L. pneumophila Serogp 1 Ur Ag: NEGATIVE

## 2024-03-14 NOTE — Transitions of Care (Post Inpatient/ED Visit) (Signed)
" ° °  03/14/2024  Name: SELENI MELLER MRN: [id number] DOB: 23-Apr-1970  Today's TOC FU Call Status: Today's TOC FU Call Status:: Unsuccessful Call (1st Attempt) Unsuccessful Call (1st Attempt) Date: 03/14/24  Attempted to reach the patient regarding the most recent Inpatient/ED visit. Left a HIPAA approved voicemail message to phone number provided in demographics per DPR.    Follow Up Plan: Additional outreach attempts will be made to reach the patient to complete the Transitions of Care (Post Inpatient/ED visit) call.   Richerd Fish, RN, BSN, CCM [facility], [facility] Management Coordinator Direct Dial: [phone]        "

## 2024-03-14 NOTE — Transitions of Care (Post Inpatient/ED Visit) (Signed)
 "  03/14/2024  Name: Alexandra [name] MRN: [id number] DOB: [birth year]  Today's TOC FU Call Status: Today's TOC FU Call Status:: Successful TOC FU Call Completed TOC FU Call Complete Date: 03/14/24  Patient's Name and Date of Birth confirmed. DOB, Name  Transition Care Management Follow-up Telephone Call Date of Discharge: 03/13/24 Discharge Facility: [facility]) Type of Discharge: Inpatient Admission Primary Inpatient Discharge Diagnosis:: Sepsis due to pneumonia How have you been since you were released from the hospital?: Better Any questions or concerns?: No  Items Reviewed: Did you receive and understand the discharge instructions provided?: Yes Medications obtained,verified, and reconciled?: Yes (Medications Reviewed) Any new allergies since your discharge?: No Dietary orders reviewed?: NA Do you have support at home?: Yes People in Home [RPT]: child(ren), adult Name of Support/Comfort Primary Source: [place], dtr lives with her; mother is supportive  Medications Reviewed Today: Medications Reviewed Today     Reviewed by Eilleen Richerd GRADE, RN (Registered Nurse) on 03/14/24 at 1536  Med List Status: <None>   Medication Order Taking? Sig Documenting Provider Last Dose Status Informant  albuterol  (VENTOLIN  HFA) 108 (90 Base) MCG/ACT inhaler [id number] Yes Inhale 1-2 puffs into the lungs every 6 (six) hours as needed for wheezing or shortness of breath (cough). Moishe Chiquita HERO, NP  Active Self           Med Note SIGRID, [facility]   Sat Jul 23, 2023 10:56 PM) PRN  apixaban  (ELIQUIS ) 5 MG TABS tablet [id number] Yes Take 1 tablet (5 mg total) by mouth 2 (two) times daily. Brown, Fallon E, NP  Active Self  aspirin  EC 81 MG EC tablet [id number] Yes Take 1 tablet (81 mg total) by mouth daily. Swallow whole. Stegmayer, Suzen LABOR, PA-C  Active Self  atorvastatin  (LIPITOR) 40 MG tablet [id number] Yes TAKE 1 TABLET(40 MG) BY MOUTH DAILY Simmons-Robinson,  Makiera, MD  Active Self  citalopram  (CELEXA ) 40 MG tablet [id number] Yes Take 1 tablet (40 mg total) by mouth daily. Simmons-Robinson, Makiera, MD  Active Self  dextromethorphan -guaiFENesin  (MUCINEX  DM) 30-600 MG 12hr tablet [id number] Yes Take 1 tablet by mouth 2 (two) times daily as needed for cough. Caleen Qualia, MD  Active   ferrous gluconate  [facility]) 324 MG tablet [id number] Yes Take 1 tablet (324 mg total) by mouth daily with breakfast. Franchot Novel, MD  Active Self  fluticasone  (FLONASE ) 50 MCG/ACT nasal spray [id number] Yes Place 2 sprays into both nostrils daily. Simmons-Robinson, Rockie, MD  Active Self           Med Note ZENA NATHANAEL CROME   Dju Mar 10, 2024 12:19 PM) PRN  folic acid (FOLVITE) 400 MCG tablet [id number] Yes Take 400 mcg by mouth daily. [provider]  Active Self  gabapentin  (NEURONTIN ) 100 MG capsule [id number] Yes Take 1 capsule (100 mg total) by mouth 2 (two) times daily as needed. Simmons-Robinson, Makiera, MD  Active Self           Med Note ZENA NATHANAEL CROME   Sat Mar 10, 2024 12:20 PM) PRN  ipratropium-albuterol  (DUONEB) 0.5-2.5 (3) MG/3ML SOLN [id number] Yes Take 3 mLs by nebulization every 6 (six) hours as needed. Caleen Qualia, MD  Active   levofloxacin  (LEVAQUIN ) 750 MG tablet [id number] Yes Take 1 tablet (750 mg total) by mouth daily for 5 days. Caleen Qualia, MD  Active   linaclotide  (LINZESS ) 290 MCG CAPS capsule [id number]  Take 1 capsule (290 mcg total) by mouth daily before breakfast.  Patient taking differently:  Take 290 mcg by mouth as needed.   [place], PA-C  Expired 01/04/24 2359 Self, Pharmacy Records           Med Note LESLY, RICHERD CINDERELLA [place] Mar 14, 2024  3:35 PM) Not needed  methotrexate (RHEUMATREX) 10 MG tablet [id number] Yes Take 60 mg by mouth once a week. Caution: Chemotherapy. Protect from light. [provider]  Active Self  midodrine  (PROAMATINE ) 5 MG tablet [id number] Yes Take 1 tablet (5 mg total) by mouth 3  (three) times daily with meals. [place] Clap, MD  Active Self  pantoprazole  (PROTONIX ) 40 MG tablet [id number] Yes Take 1 tablet (40 mg total) by mouth daily. Simmons-Robinson, Rockie, MD  Active Self  Pirfenidone  267 MG CAPS [id number] Yes Take 1 capsule by mouth in the morning, at noon, and at bedtime. [provider]  Active Self  predniSONE  (DELTASONE ) 2.5 MG tablet [id number] Yes Take 2.5 mg by mouth daily with breakfast. [provider]  Active Self            Home Care and Equipment/Supplies: Were Home Health Services Ordered?: NA Any new equipment or medical supplies ordered?: NA  Functional Questionnaire: Do you need assistance with bathing/showering or dressing?: No Do you need assistance with meal preparation?: No Do you need assistance with eating?: No Do you have difficulty maintaining continence: No Do you need assistance with getting out of bed/getting out of a chair/moving?: No (I am a right BKA and I use to drive but not in the past year) Do you have difficulty managing or taking your medications?: No  Follow up appointments reviewed: PCP Follow-up appointment confirmed?: No (Patient plans on see specialist for right now) Specialist Hospital Follow-up appointment confirmed?: Yes Date of Specialist follow-up appointment?: 03/19/24 Follow-Up Specialty Provider:: Theotis Lavelle BRAVO [facility] Do you need transportation to your follow-up appointment?: No Do you understand care options if your condition(s) worsen?: Yes-patient verbalized understanding  SDOH Interventions Today    Flowsheet Row Most Recent Value  SDOH Interventions   Food Insecurity Interventions Intervention Not Indicated  [I get Food Stamps]  Housing Interventions Intervention Not Indicated  Transportation Interventions Patient Resources (Friends/Family), Intervention Not Indicated  Utilities Interventions Intervention Not Indicated   Education for self-mgmt of  pneumonia provided during this outreach regarding: reviewed hospital AVS.  Encouraged to have follow up with PCP -s/s of worsening condition and when to seek medical attention -importance of completing all post discharge hospital hospital follow up appts -adherence to med regimen [facility] TOC 30-day program enrollment reviewed and discussed with pt/caregiver. They have declined enrollment in 30 day TOC program due to: feel it's not needed right now.  Richerd Fish, RN, BSN, CCM [facility], [facility] Management Coordinator Direct Dial: [phone]        "

## 2024-03-15 LAB — CULTURE, BLOOD (ROUTINE X 2)
Culture: NO GROWTH
Culture: NO GROWTH
Special Requests: ADEQUATE

## 2024-03-23 ENCOUNTER — Other Ambulatory Visit: Payer: Self-pay | Admitting: Orthopedic Surgery

## 2024-03-23 DIAGNOSIS — G8929 Other chronic pain: Secondary | ICD-10-CM

## 2024-03-26 ENCOUNTER — Ambulatory Visit: Admission: RE | Admit: 2024-03-26 | Source: Ambulatory Visit

## 2024-04-06 ENCOUNTER — Other Ambulatory Visit: Payer: Self-pay | Admitting: Family Medicine

## 2024-04-06 NOTE — Telephone Encounter (Signed)
 Requested Prescriptions  Refused Prescriptions Disp Refills   gabapentin  (NEURONTIN ) 100 MG capsule [Pharmacy Med Name: GABAPENTIN  100MG  CAPSULES] 90 capsule     Sig: TAKE 1 CAPSULE(100 MG) BY MOUTH AT BEDTIME     Neurology: Anticonvulsants - gabapentin  Passed - 04/06/2024  3:01 PM      Passed - Cr in normal range and within 360 days    Creatinine  Date Value Ref Range Status  11/08/2013 0.73 0.60 - 1.30 mg/dL Final   Creatinine, Ser  Date Value Ref Range Status  03/12/2024 0.48 0.44 - 1.00 mg/dL Final         Passed - Completed PHQ-2 or PHQ-9 in the last 360 days      Passed - Valid encounter within last 12 months    Recent Outpatient Visits           3 months ago Chronic pain of right knee   [facility] [place], [place], PA-C   5 months ago Tinnitus of both ears   [place] [facility] [place], [place], MD   11 months ago Swelling of joint of right knee   [facility] [place], [place] LABOR, [place]

## 2024-04-17 ENCOUNTER — Ambulatory Visit: Admitting: Family Medicine

## 2024-04-17 ENCOUNTER — Encounter: Payer: Self-pay | Admitting: Family Medicine

## 2024-04-17 VITALS — BP 118/70 | HR 112 | Ht 62.0 in | Wt 180.5 lb

## 2024-04-17 DIAGNOSIS — M5410 Radiculopathy, site unspecified: Secondary | ICD-10-CM

## 2024-04-17 DIAGNOSIS — R7989 Other specified abnormal findings of blood chemistry: Secondary | ICD-10-CM

## 2024-04-17 DIAGNOSIS — J8410 Pulmonary fibrosis, unspecified: Secondary | ICD-10-CM | POA: Diagnosis not present

## 2024-04-17 DIAGNOSIS — M79671 Pain in right foot: Secondary | ICD-10-CM

## 2024-04-17 DIAGNOSIS — B9689 Other specified bacterial agents as the cause of diseases classified elsewhere: Secondary | ICD-10-CM

## 2024-04-17 DIAGNOSIS — F32A Depression, unspecified: Secondary | ICD-10-CM

## 2024-04-17 DIAGNOSIS — F4320 Adjustment disorder, unspecified: Secondary | ICD-10-CM | POA: Diagnosis not present

## 2024-04-17 MED ORDER — ALBUTEROL SULFATE HFA 108 (90 BASE) MCG/ACT IN AERS: 1.0000 | INHALATION_SPRAY | Freq: Four times a day (QID) | RESPIRATORY_TRACT | 6 refills | Status: AC | PRN

## 2024-04-17 MED ORDER — ALPRAZOLAM 0.5 MG PO TABS: 0.5000 mg | ORAL_TABLET | Freq: Three times a day (TID) | ORAL | 1 refills | Status: AC | PRN

## 2024-04-17 MED ORDER — GABAPENTIN 100 MG PO CAPS: 100.0000 mg | ORAL_CAPSULE | Freq: Two times a day (BID) | ORAL | 3 refills | Status: AC | PRN

## 2024-04-17 NOTE — Patient Instructions (Signed)
 To keep you healthy, please keep in mind the following health maintenance items that you are due for:   Health Maintenance Due  Topic Date Due   Medicare Annual Wellness (AWV)  Never done   COVID-19 Vaccine (1) Never done   Pneumococcal Vaccine: 50+ Years (1 of 2 - PCV) Never done   Hepatitis B Vaccines 19-59 Average Risk (1 of 3 - 19+ 3-dose series) Never done   Cervical Cancer Screening (HPV/Pap Cotest)  01/15/2020   Mammogram  08/17/2023   Influenza Vaccine  Never done     Best Wishes,   Dr. Lang

## 2024-04-18 NOTE — Assessment & Plan Note (Signed)
 Chronic  Continue gabapentin  100mg  BID

## 2024-04-18 NOTE — Assessment & Plan Note (Signed)
 Chronic  Acutely worsened due to grief  Continue Celexa  40mg  daily Start short term course of Xanax  0.5mg  BID PRN

## 2024-04-18 NOTE — Assessment & Plan Note (Signed)
 Chronic  Recent hospitalizations for pneumonia. Increased risk of respiratory complications due to underlying pulmonary fibrosis. - Refilled albuterol  prescription for use as needed.

## 2024-07-05 ENCOUNTER — Encounter (INDEPENDENT_AMBULATORY_CARE_PROVIDER_SITE_OTHER)

## 2024-07-05 ENCOUNTER — Ambulatory Visit (INDEPENDENT_AMBULATORY_CARE_PROVIDER_SITE_OTHER): Admitting: Nurse Practitioner

## 2024-07-18 ENCOUNTER — Ambulatory Visit: Admitting: Family Medicine
